# Patient Record
Sex: Female | Born: 1945 | Race: Black or African American | Hispanic: No | Smoking: Never smoker
Health system: Southern US, Community
[De-identification: ages and names within clinical notes are randomized; demographics above are authoritative.]

## PROBLEM LIST (undated history)

## (undated) DIAGNOSIS — H269 Unspecified cataract: Secondary | ICD-10-CM

## (undated) DIAGNOSIS — I10 Essential (primary) hypertension: Secondary | ICD-10-CM

## (undated) DIAGNOSIS — Z8719 Personal history of other diseases of the digestive system: Secondary | ICD-10-CM

## (undated) DIAGNOSIS — M1990 Unspecified osteoarthritis, unspecified site: Secondary | ICD-10-CM

## (undated) DIAGNOSIS — E785 Hyperlipidemia, unspecified: Secondary | ICD-10-CM

## (undated) DIAGNOSIS — G5600 Carpal tunnel syndrome, unspecified upper limb: Secondary | ICD-10-CM

## (undated) DIAGNOSIS — K219 Gastro-esophageal reflux disease without esophagitis: Secondary | ICD-10-CM

## (undated) DIAGNOSIS — Z5189 Encounter for other specified aftercare: Secondary | ICD-10-CM

## (undated) DIAGNOSIS — N8110 Cystocele, unspecified: Secondary | ICD-10-CM

## (undated) DIAGNOSIS — Z87410 Personal history of cervical dysplasia: Secondary | ICD-10-CM

## (undated) HISTORY — DX: Gastro-esophageal reflux disease without esophagitis: K21.9

## (undated) HISTORY — DX: Hyperlipidemia, unspecified: E78.5

## (undated) HISTORY — DX: Unspecified cataract: H26.9

## (undated) HISTORY — PX: TUBAL LIGATION: SHX77

## (undated) HISTORY — DX: Encounter for other specified aftercare: Z51.89

## (undated) HISTORY — PX: BREAST BIOPSY: SHX20

## (undated) HISTORY — DX: Carpal tunnel syndrome, unspecified upper limb: G56.00

## (undated) HISTORY — PX: OTHER SURGICAL HISTORY: SHX169

## (undated) HISTORY — PX: NASAL SEPTUM SURGERY: SHX37

## (undated) HISTORY — DX: Essential (primary) hypertension: I10

---

## 1989-06-29 HISTORY — PX: ABDOMINAL HYSTERECTOMY: SHX81

## 1997-11-08 ENCOUNTER — Other Ambulatory Visit: Admission: RE | Admit: 1997-11-08 | Discharge: 1997-11-08 | Payer: Self-pay | Admitting: Obstetrics and Gynecology

## 1998-11-11 ENCOUNTER — Other Ambulatory Visit: Admission: RE | Admit: 1998-11-11 | Discharge: 1998-11-11 | Payer: Self-pay | Admitting: Obstetrics and Gynecology

## 1999-07-02 ENCOUNTER — Encounter: Admission: RE | Admit: 1999-07-02 | Discharge: 1999-07-02 | Payer: Self-pay | Admitting: Obstetrics and Gynecology

## 1999-07-02 ENCOUNTER — Encounter: Payer: Self-pay | Admitting: Obstetrics and Gynecology

## 1999-11-18 ENCOUNTER — Other Ambulatory Visit: Admission: RE | Admit: 1999-11-18 | Discharge: 1999-11-18 | Payer: Self-pay | Admitting: Obstetrics and Gynecology

## 2000-07-02 ENCOUNTER — Encounter: Payer: Self-pay | Admitting: Obstetrics and Gynecology

## 2000-07-02 ENCOUNTER — Encounter: Admission: RE | Admit: 2000-07-02 | Discharge: 2000-07-02 | Payer: Self-pay | Admitting: Obstetrics and Gynecology

## 2000-11-18 ENCOUNTER — Other Ambulatory Visit: Admission: RE | Admit: 2000-11-18 | Discharge: 2000-11-18 | Payer: Self-pay | Admitting: Obstetrics and Gynecology

## 2001-07-07 ENCOUNTER — Encounter: Admission: RE | Admit: 2001-07-07 | Discharge: 2001-07-07 | Payer: Self-pay | Admitting: Obstetrics and Gynecology

## 2001-07-07 ENCOUNTER — Encounter: Payer: Self-pay | Admitting: Obstetrics and Gynecology

## 2002-07-10 ENCOUNTER — Encounter: Admission: RE | Admit: 2002-07-10 | Discharge: 2002-07-10 | Payer: Self-pay | Admitting: Obstetrics and Gynecology

## 2002-07-10 ENCOUNTER — Encounter: Payer: Self-pay | Admitting: Obstetrics and Gynecology

## 2003-07-13 ENCOUNTER — Encounter: Admission: RE | Admit: 2003-07-13 | Discharge: 2003-07-13 | Payer: Self-pay | Admitting: Obstetrics and Gynecology

## 2003-10-31 ENCOUNTER — Ambulatory Visit (HOSPITAL_COMMUNITY): Admission: RE | Admit: 2003-10-31 | Discharge: 2003-10-31 | Payer: Self-pay | Admitting: Orthopedic Surgery

## 2004-04-08 ENCOUNTER — Ambulatory Visit (HOSPITAL_COMMUNITY): Admission: RE | Admit: 2004-04-08 | Discharge: 2004-04-08 | Payer: Self-pay | Admitting: Internal Medicine

## 2004-05-17 ENCOUNTER — Ambulatory Visit: Payer: Self-pay | Admitting: Internal Medicine

## 2004-06-29 HISTORY — PX: LAPAROSCOPIC CHOLECYSTECTOMY: SUR755

## 2004-07-23 ENCOUNTER — Encounter: Admission: RE | Admit: 2004-07-23 | Discharge: 2004-07-23 | Payer: Self-pay | Admitting: Obstetrics and Gynecology

## 2004-07-25 ENCOUNTER — Ambulatory Visit: Payer: Self-pay | Admitting: Gastroenterology

## 2004-07-28 ENCOUNTER — Ambulatory Visit: Payer: Self-pay | Admitting: Gastroenterology

## 2004-07-30 ENCOUNTER — Ambulatory Visit: Payer: Self-pay | Admitting: Gastroenterology

## 2004-08-21 ENCOUNTER — Ambulatory Visit: Payer: Self-pay | Admitting: Internal Medicine

## 2004-08-28 ENCOUNTER — Ambulatory Visit: Payer: Self-pay | Admitting: Gastroenterology

## 2005-01-23 ENCOUNTER — Ambulatory Visit: Payer: Self-pay | Admitting: Internal Medicine

## 2005-02-25 ENCOUNTER — Ambulatory Visit: Payer: Self-pay | Admitting: Internal Medicine

## 2005-05-05 ENCOUNTER — Ambulatory Visit: Payer: Self-pay | Admitting: Gastroenterology

## 2005-06-15 ENCOUNTER — Ambulatory Visit: Payer: Self-pay | Admitting: Internal Medicine

## 2005-06-25 ENCOUNTER — Encounter (INDEPENDENT_AMBULATORY_CARE_PROVIDER_SITE_OTHER): Payer: Self-pay | Admitting: Specialist

## 2005-06-25 ENCOUNTER — Ambulatory Visit: Payer: Self-pay | Admitting: Internal Medicine

## 2005-06-26 ENCOUNTER — Ambulatory Visit: Payer: Self-pay | Admitting: Cardiology

## 2005-06-26 ENCOUNTER — Inpatient Hospital Stay (HOSPITAL_COMMUNITY): Admission: RE | Admit: 2005-06-26 | Discharge: 2005-07-23 | Payer: Self-pay | Admitting: Surgery

## 2005-07-10 ENCOUNTER — Encounter (INDEPENDENT_AMBULATORY_CARE_PROVIDER_SITE_OTHER): Payer: Self-pay | Admitting: Specialist

## 2005-07-13 ENCOUNTER — Encounter: Payer: Self-pay | Admitting: Internal Medicine

## 2005-07-20 ENCOUNTER — Ambulatory Visit: Payer: Self-pay | Admitting: Infectious Diseases

## 2005-08-07 ENCOUNTER — Ambulatory Visit: Payer: Self-pay | Admitting: Internal Medicine

## 2005-08-21 ENCOUNTER — Ambulatory Visit: Payer: Self-pay | Admitting: Internal Medicine

## 2005-09-08 ENCOUNTER — Ambulatory Visit (HOSPITAL_COMMUNITY): Admission: RE | Admit: 2005-09-08 | Discharge: 2005-09-08 | Payer: Self-pay | Admitting: Surgery

## 2005-09-22 ENCOUNTER — Ambulatory Visit: Payer: Self-pay | Admitting: Internal Medicine

## 2006-02-23 ENCOUNTER — Ambulatory Visit: Payer: Self-pay | Admitting: Internal Medicine

## 2006-03-04 ENCOUNTER — Ambulatory Visit (HOSPITAL_COMMUNITY): Admission: RE | Admit: 2006-03-04 | Discharge: 2006-03-04 | Payer: Self-pay | Admitting: Obstetrics and Gynecology

## 2006-06-17 ENCOUNTER — Ambulatory Visit: Payer: Self-pay | Admitting: Internal Medicine

## 2006-10-01 ENCOUNTER — Ambulatory Visit: Payer: Self-pay | Admitting: Internal Medicine

## 2007-04-27 ENCOUNTER — Ambulatory Visit (HOSPITAL_COMMUNITY): Admission: RE | Admit: 2007-04-27 | Discharge: 2007-04-27 | Payer: Self-pay | Admitting: Obstetrics and Gynecology

## 2007-04-29 ENCOUNTER — Ambulatory Visit: Payer: Self-pay | Admitting: Internal Medicine

## 2007-04-29 ENCOUNTER — Encounter: Payer: Self-pay | Admitting: Internal Medicine

## 2007-08-04 ENCOUNTER — Ambulatory Visit: Payer: Self-pay | Admitting: Internal Medicine

## 2008-03-27 ENCOUNTER — Telehealth: Payer: Self-pay | Admitting: Internal Medicine

## 2008-04-24 ENCOUNTER — Ambulatory Visit: Payer: Self-pay | Admitting: Internal Medicine

## 2008-04-24 LAB — CONVERTED CEMR LAB
Cholesterol: 271 mg/dL (ref 0–200)
Total CHOL/HDL Ratio: 5.6
VLDL: 20 mg/dL (ref 0–40)

## 2008-04-26 ENCOUNTER — Encounter: Payer: Self-pay | Admitting: Internal Medicine

## 2008-04-27 ENCOUNTER — Ambulatory Visit (HOSPITAL_COMMUNITY): Admission: RE | Admit: 2008-04-27 | Discharge: 2008-04-27 | Payer: Self-pay | Admitting: Obstetrics and Gynecology

## 2009-02-11 ENCOUNTER — Ambulatory Visit (HOSPITAL_COMMUNITY): Admission: RE | Admit: 2009-02-11 | Discharge: 2009-02-11 | Payer: Self-pay | Admitting: Internal Medicine

## 2009-02-11 ENCOUNTER — Ambulatory Visit: Payer: Self-pay | Admitting: Internal Medicine

## 2009-02-11 ENCOUNTER — Telehealth (INDEPENDENT_AMBULATORY_CARE_PROVIDER_SITE_OTHER): Payer: Self-pay | Admitting: *Deleted

## 2009-02-11 DIAGNOSIS — M25562 Pain in left knee: Secondary | ICD-10-CM | POA: Insufficient documentation

## 2009-02-11 DIAGNOSIS — E782 Mixed hyperlipidemia: Secondary | ICD-10-CM | POA: Insufficient documentation

## 2009-02-11 DIAGNOSIS — E785 Hyperlipidemia, unspecified: Secondary | ICD-10-CM

## 2009-02-11 LAB — CONVERTED CEMR LAB
Alkaline Phosphatase: 67 units/L (ref 39–117)
Bilirubin, Direct: 0.2 mg/dL (ref 0.0–0.3)
HDL: 68 mg/dL (ref >39)
Indirect Bilirubin: 0.9 mg/dL (ref 0.0–0.9)
Total Bilirubin: 1.1 mg/dL (ref 0.3–1.2)
Triglycerides: 132 mg/dL (ref <150)
VLDL: 26 mg/dL (ref 0–40)

## 2009-03-15 ENCOUNTER — Ambulatory Visit: Payer: Self-pay | Admitting: Internal Medicine

## 2009-03-17 DIAGNOSIS — K650 Generalized (acute) peritonitis: Secondary | ICD-10-CM | POA: Insufficient documentation

## 2009-03-17 DIAGNOSIS — G5600 Carpal tunnel syndrome, unspecified upper limb: Secondary | ICD-10-CM | POA: Insufficient documentation

## 2009-03-17 DIAGNOSIS — K219 Gastro-esophageal reflux disease without esophagitis: Secondary | ICD-10-CM | POA: Insufficient documentation

## 2009-04-30 ENCOUNTER — Ambulatory Visit (HOSPITAL_COMMUNITY): Admission: RE | Admit: 2009-04-30 | Discharge: 2009-04-30 | Payer: Self-pay | Admitting: Internal Medicine

## 2009-04-30 LAB — HM MAMMOGRAPHY: HM Mammogram: NEGATIVE

## 2009-05-27 ENCOUNTER — Ambulatory Visit: Payer: Self-pay | Admitting: Internal Medicine

## 2009-05-27 DIAGNOSIS — J069 Acute upper respiratory infection, unspecified: Secondary | ICD-10-CM | POA: Insufficient documentation

## 2009-08-05 ENCOUNTER — Telehealth (INDEPENDENT_AMBULATORY_CARE_PROVIDER_SITE_OTHER): Payer: Self-pay | Admitting: *Deleted

## 2009-08-05 DIAGNOSIS — S025XXA Fracture of tooth (traumatic), initial encounter for closed fracture: Secondary | ICD-10-CM | POA: Insufficient documentation

## 2009-09-16 ENCOUNTER — Telehealth: Payer: Self-pay | Admitting: Internal Medicine

## 2009-09-16 ENCOUNTER — Ambulatory Visit: Payer: Self-pay | Admitting: Internal Medicine

## 2009-09-17 ENCOUNTER — Ambulatory Visit: Payer: Self-pay | Admitting: Cardiovascular Disease

## 2009-09-17 ENCOUNTER — Ambulatory Visit: Payer: Self-pay | Admitting: Internal Medicine

## 2009-09-17 LAB — CONVERTED CEMR LAB
CO2: 33 meq/L — ABNORMAL HIGH (ref 19–32)
Chloride: 102 meq/L (ref 96–112)
Creatinine, Ser: 0.7 mg/dL (ref 0.4–1.2)
Eosinophils Absolute: 0.1 10*3/uL (ref 0.0–0.7)
Glucose, Bld: 98 mg/dL (ref 70–99)
Lymphocytes Relative: 37.2 % (ref 12.0–46.0)
Lymphs Abs: 1.8 10*3/uL (ref 0.7–4.0)
MCHC: 33.2 g/dL (ref 30.0–36.0)
MCV: 93.1 fL (ref 78.0–100.0)
Monocytes Relative: 9.7 % (ref 3.0–12.0)
Neutrophils Relative %: 50.7 % (ref 43.0–77.0)
Platelets: 247 10*3/uL (ref 150.0–400.0)
RBC: 4.25 M/uL (ref 3.87–5.11)
RDW: 13 % (ref 11.5–14.6)
WBC: 4.8 10*3/uL (ref 4.5–10.5)

## 2010-01-03 ENCOUNTER — Telehealth: Payer: Self-pay | Admitting: Internal Medicine

## 2010-01-07 ENCOUNTER — Ambulatory Visit: Payer: Self-pay | Admitting: Internal Medicine

## 2010-05-01 ENCOUNTER — Ambulatory Visit (HOSPITAL_COMMUNITY): Admission: RE | Admit: 2010-05-01 | Discharge: 2010-05-01 | Payer: Self-pay | Admitting: Obstetrics and Gynecology

## 2010-05-16 ENCOUNTER — Ambulatory Visit: Payer: Self-pay | Admitting: Internal Medicine

## 2010-07-29 NOTE — Progress Notes (Signed)
Summary: CALL A NURSE REPORT  Phone Note Other Incoming   Summary of Call: Call-A-Nurse Triage Call Report [name] Operator: [phone] Triage Record Num: Call Date & Time: [name] Patient Name: 09/14/2009 12:42:01PM [name] PCP: [phone] Patient Phone: PCP Fax : [phone] Patient Gender: Female Aug 20, 1945 Patient DOB: Practice Name: [facility] Reason for Call: C/o upper abd pain onset last PM. Hx reflux. Pain has decreased today. Ate a green pear last PM. Nl BM x 2 today per pt that has decreased the pain. Homecare advised per abd pain protocol. Abdominal Pain / Discomfort Protocol(s) Used: Provide Home/Self Care Recommended Outcome per Protocol: Reason for Outcome: Occasional sensation of burning, fullness, belching, nausea, or acid taste (not related to exertion) Care Advice: Call EMS 911 if having any of the following symptoms: chest, neck, jaw, arm, shoulder, or upper back pain; or shortness of breath at rest.  ~ Heartburn Relief (Dietary): - Avoid overeating. - Eat smaller, more frequent meals and chew each bite thoroughly. - Avoid high fat, spicy or gas-producing foods. - Limit liquids/foods that are gastric irritants (fruit juices, caffeinated, carbonated or alcoholic beverages, chocolate and dairy products). - Eat at least three hours before bedtime.  ~ Page 1 of 1 09/14/2009 12:52:22PM [email or web address] Initial call taken by: Lamar Sprinkles, CMA,  September 16, 2009 11:32 AM  Follow-up for Phone Call        ok Follow-up by: Jacques Navy MD,  September 16, 2009 1:06 PM

## 2010-07-29 NOTE — Progress Notes (Signed)
Summary: DENTAL REFERRAL  Phone Note Call from Patient Call back at Home Phone [phone]   Summary of Call: Patient is requesting a referral to a dental clinic. She has a broken tooth and has [employer].  Initial call taken by: Lamar Sprinkles, CMA,  August 05, 2009 3:32 PM  Follow-up for Phone Call        OK  but to whom is she referred.  Follow-up by: Jacques Navy MD,  August 05, 2009 5:31 PM  Additional Follow-up for Phone Call Additional follow up Details #1::        Spoke with patient, she was told by coordinator that waiting list is [phone] people, so she will not be seen for a long time unless it is an emergency. Please put in referral to [facility] ([phone]). No c/o pain or sensitivity with the tooth but she is afraid that it may break more. She is being very cautious with what she chooses to bite.  Additional Follow-up by: Lamar Sprinkles, CMA,  August 05, 2009 6:10 PM  New Problems: BROKEN TOOTH [phone])   Additional Follow-up for Phone Call Additional follow up Details #2::    OK. forwading to [facility] Follow-up by: Jacques Navy MD,  August 06, 2009 5:01 AM  Additional Follow-up for Phone Call Additional follow up Details #3:: Details for Additional Follow-up Action Taken: Spoke with Pt on Feb 8th informed her that [facility] only takes referrals from [facility] and [facility] [employer] account executive.Pt said she would try and locate a dentist. Additional Follow-up by: Dagoberto Reef,  August 08, 2009 12:57 PM  New Problems: BROKEN TOOTH [phone])

## 2010-07-29 NOTE — Assessment & Plan Note (Signed)
Summary: GAS PAIN X2 DAYS/NO DIARR/NO VOMIT/CD   Vital Signs:  Patient profile:   65 year old female Height:      60.5 inches (153.67 cm) Weight:      143.25 pounds (65.11 kg) O2 Sat:      97 % on Room air Temp:     98.4 degrees F (36.89 degrees C) oral Pulse rate:   75 / minute BP sitting:   114 / 80  (left arm) Cuff size:   regular  Vitals Entered By: Bill Salinas CMA (September 16, 2009 4:46 PM) Taken by Sydell Axon SMA  O2 Flow:  Room air CC: Pt c/o pain in the epigastric region, gas, belching, discomfort X 2days, slight nausea. Pt declined Fluvax, TD booster, Per pt, last mammogram 04/2009, last pap 08/2006, pt requests another. //[place]   Primary Care Provider:  [facility]  CC:  Pt c/o pain in the epigastric region, gas, belching, discomfort X 2days, slight nausea. Pt declined Fluvax, TD booster, Per pt, last mammogram 04/2009, last pap 08/2006, and pt requests another. //[place].  History of Present Illness: Patient is having troule with her stomach with lots of eructation, intermittent pain in the epigastrum - like a spasm on Friday and has continued symptoms.  She has taken Gax-X and benefiber. She is still a little tender. She continues to take prilosec. She has not tried any antacid. She did get relief from baking soda. No change in the bowel habit or in the stools no melena or hematochezia.   After exam she does report migration of her pain from epigastrum to RLQ. She does admit to loss of appetite  Current Medications (verified): 1)  Prilosec Otc 20 Mg  Tbec (Omeprazole Magnesium) .... Once Daily 2)  Multivitamins   Tabs (Multiple Vitamin) .... Once Daily 3)  Simvastatin 20 Mg Tabs (Simvastatin) .Marland Kitchen.. 1 By Mouth Q Pm Fpor Cholesterol 4)  Vit D 400 Iu .... As Needed 5)  Fish Oil .... As Needed  Allergies (verified): 1)  ! Hydrocodone  Past History:  Past Medical History: Last updated: 2009-03-17 Hx of PERITONITIS (ACUTE) GENERALIZED (ICD-567.21) GERD (ICD-530.81) CARPAL  TUNNEL SYNDROME (ICD-354.0) KNEE PAIN, RIGHT (ICD-719.46) OTHER AND UNSPECIFIED HYPERLIPIDEMIA (ICD-272.4)  Past Surgical History: Last updated: 03-17-2009 1) deviated septum in 70's 2) tubal ligation 1991 3) total hysterectomy 1991 4) laproscopic cholecystectomy with complications '06  Family History: Last updated: 03/17/09 Father - deceased  @90 : CVA Mother - deceased @ 46: CAD/M, CVA, HTN IFamily History Diabetes 1st degree relative Family History of CAD Female 1st degree relative <50 Family History Hypertension Family History of Stroke F 1st degree relative <60 Family History Breast cancer 1st degree relative <50  Social History: Last updated: 2009/03/17 HSG Married '68 2 son - '69, '70; 1 son - '71: 2 grandchildren 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  Drug use-no Regular exercise-no  Review of Systems       The patient complains of abdominal pain and severe indigestion/heartburn.  The patient denies anorexia, fever, weight loss, weight gain, hoarseness, dyspnea on exertion, peripheral edema, prolonged cough, muscle weakness, transient blindness, depression, abnormal bleeding, and angioedema.    Physical Exam  General:  Well-developed,well-nourished,in no acute distress; alert,appropriate and cooperative throughout examination Head:  normocephalic.   Lungs:  normal respiratory effort and normal breath sounds.   Heart:  normal rate and regular rhythm.   Abdomen:  tneder to percussion and palpation at McBurney's point.  BS positive.   Impression & Recommendations:  Problem # 1:  ABDOMINAL PAIN, RIGHT LOWER QUADRANT (ICD-789.03) Patient with RLQ pain to percussion and palpation with a history of migrating pain. Concern for appendicitis. She is afebrile and hemodynamically stable and does not require emergent treatment.  Plan - CT abdomen 09/17/09           return for CBC, metabolic panel 09/17/09  Orders: Radiology Referral (Radiology)  Complete  Medication List: 1)  Prilosec Otc 20 Mg Tbec (Omeprazole magnesium) .... Once daily 2)  Multivitamins Tabs (Multiple vitamin) .... Once daily 3)  Simvastatin 20 Mg Tabs (Simvastatin) .Marland Kitchen.. 1 by mouth q pm fpor cholesterol 4)  Vit D 400 Iu  .... As needed 5)  Fish Oil  .... As needed  Patient Instructions: 1)  Right Lower quadrant abdominal pain - need to rule out appendicitis. Plan - may take Mylanta II every four hours for pain in the upper abdomen. Will arrange for a CT scan of the abdomen for Tuesday, 09/17/09, at [facility] on [address]. We will call you with a time. Please drink 1st bottle of contrast material 2 hours before the study, 2nd bottle 1 hour before the study. Come by in the AM for lab - a CBC.   Preventive Care Screening  Mammogram:    Date:  04/29/2009    Results:  normal

## 2010-07-29 NOTE — Progress Notes (Signed)
Summary: Neck pain  Phone Note Call from Patient Call back at Home Phone [phone]   Summary of Call: Pt c/o soreness on left side of her neck x 1 wk. Soreness is there when she turns her head. No fever, swelling, heat or injuries. She is taking tylenol w/relief. Symptoms are better than when first started. Advised office visit tomorrow or contact office if symptoms persisted, became worse or changed. She agreed.  Initial call taken by: Lamar Sprinkles, CMA,  January 03, 2010 10:36 AM  Follow-up for Phone Call        Noted. Agree. Thx Follow-up by: Tresa Garter MD,  January 03, 2010 5:54 PM

## 2010-07-29 NOTE — Assessment & Plan Note (Signed)
Summary: neck pain/tb skin test/cd   Vital Signs:  Patient profile:   65 year old female Height:      60.5 inches Weight:      143 pounds BMI:     27.57 O2 Sat:      97 % on Room air Temp:     97.4 degrees F oral Pulse rate:   64 / minute BP sitting:   124 / 70  (left arm) Cuff size:   regular  Vitals Entered By: Bill Salinas CMA (January 07, 2010 1:44 PM)  O2 Flow:  Room air  Primary Care Provider:  Cayleen Benjamin   History of Present Illness: Patient presents for a 10 day history of fullness and discomfort and the angle of the jaw left. No fever, no chills, no otalgia. The area under the jaw is painful at times but is now better. No night sweats, no weight loss. No pain with mastication. She does have discomfort with deep inspiration and will have some discomfort in the posterior cervical area.   Current Medications (verified): 1)  Prilosec Otc 20 Mg  Tbec (Omeprazole Magnesium) .... Once Daily 2)  Multivitamins   Tabs (Multiple Vitamin) .... Once Daily 3)  Simvastatin 20 Mg Tabs (Simvastatin) .Marland Kitchen.. 1 By Mouth Q Pm Fpor Cholesterol 4)  Vit D 400 Iu .... As Needed 5)  Fish Oil .... As Needed 6)  Mylanta 200-200-20 Mg/29ml Susp (Alum & Mag Hydroxide-Simeth) .... As Needed  Allergies (verified): 1)  ! Hydrocodone  Past History:  Past Medical History: Last updated: 03/15/2009 Hx of PERITONITIS (ACUTE) GENERALIZED (ICD-567.21) GERD (ICD-530.81) CARPAL TUNNEL SYNDROME (ICD-354.0) KNEE PAIN, RIGHT (ICD-719.46) OTHER AND UNSPECIFIED HYPERLIPIDEMIA (ICD-272.4)  Past Surgical History: Last updated: 03/15/2009 1) deviated septum in 70's 2) tubal ligation 1991 3) total hysterectomy 1991 4) laproscopic cholecystectomy with complications '06 PSH reviewed for relevance, FH reviewed for relevance  Review of Systems  The patient denies anorexia, fever, weight loss, decreased hearing, syncope, dyspnea on exertion, prolonged cough, headaches, muscle weakness, suspicious skin lesions, and  enlarged lymph nodes.    Physical Exam  General:  WNWD AA female no distress Head:  Normocephalic and atraumatic without obvious abnormalities. No apparent alopecia or balding. Mouth:  no oral lesions. Posterior pharynx clear Neck:  easily palpable left submandibular gland iwth minimal tenderness. Lungs:  normal respiratory effort and normal breath sounds.   Heart:  normal rate and regular rhythm.   Cervical Nodes:  anterior left cervical chain with enlarge, mobile, minimally tender lymph nodes.    Impression & Recommendations:  Problem # 1:  SIALADENITIS, LEFT (ICD-527.2) By history patient with mild infection of submandibular salivary gland with subsequent lymphadenopathy. She is getting better. No systemic symptoms to suggest malignancy.  Plan - observation: if she continues to improve no further work-up           for lack of improvement or recurrent symptoms will proceed directly to imaging of the neck.   Complete Medication List: 1)  Prilosec Otc 20 Mg Tbec (Omeprazole magnesium) .... Once daily 2)  Multivitamins Tabs (Multiple vitamin) .... Once daily 3)  Simvastatin 20 Mg Tabs (Simvastatin) .Marland Kitchen.. 1 by mouth q pm fpor cholesterol 4)  Vit D 400 Iu  .... As needed 5)  Fish Oil  .... As needed 6)  Mylanta 200-200-20 Mg/4ml Susp (Alum & mag hydroxide-simeth) .... As needed  Other Orders: TB Skin Test [phone]) Admin 1st Vaccine (09811)   Immunizations Administered:  PPD Skin Test:    Vaccine  Type: PPD    Site: left forearm    Mfr: Sanofi Pasteur    Dose: 0.1 ml    Route: ID    Given by: Ami Bullins CMA    Exp. Date: 11/24/2011    Lot #: [id number]  Appended Document: neck pain/tb skin test/cd     Clinical Lists Changes  Observations: Added new observation of TB PPDRESULT: negative (01/09/2010 11:44) Added new observation of PPD RESULT: < 5mm (01/09/2010 11:44) Added new observation of TB-PPD RDDTE: 01/09/2010 (01/09/2010 11:44)      Current Allergies: !  HYDROCODONE   PPD Results    Date of reading: 01/09/2010    Results: < 5mm    Interpretation: negative

## 2010-07-29 NOTE — Assessment & Plan Note (Signed)
Summary: cold,pain thigh,hip,shoulder/cd   Vital Signs:  Patient profile:   65 year old female Height:      60.5 inches Weight:      147 pounds BMI:     28.34 O2 Sat:      98 % on Room air Temp:     98.1 degrees F oral Pulse rate:   75 / minute BP sitting:   122 / 84  (left arm) Cuff size:   regular  Vitals Entered By: Bill Salinas CMA (May 16, 2010 4:54 PM)  O2 Flow:  Room air CC: pt c/o right thigh pain x 3 days/ ab   Primary Care Provider:  Lloyd Cullinan  CC:  pt c/o right thigh pain x 3 days/ ab.  History of Present Illness: Patinet reports three day history of pain in the lateral and medial aspect of the right thigh. Mild paresthesia, more laterally. Has been able to walk. Has some pain with stairs. No injuries, no strain. She took tylenol with little relief. She reports that she had mouth swelling with advil.   She does c/o mild cold symptoms but has had no fever or purulent sputum or rhinorrhea  Current Medications (verified): 1)  Prilosec Otc 20 Mg  Tbec (Omeprazole Magnesium) .... Once Daily 2)  Multivitamins   Tabs (Multiple Vitamin) .... Once Daily 3)  Simvastatin 20 Mg Tabs (Simvastatin) .Marland Kitchen.. 1 By Mouth Q Pm Fpor Cholesterol 4)  Vit D 400 Iu .... As Needed 5)  Fish Oil .... As Needed 6)  Mylanta 200-200-20 Mg/51ml Susp (Alum & Mag Hydroxide-Simeth) .... As Needed  Allergies (verified): 1)  ! Hydrocodone  Past History:  Past Medical History: Last updated: 2009/03/22 Hx of PERITONITIS (ACUTE) GENERALIZED (ICD-567.21) GERD (ICD-530.81) CARPAL TUNNEL SYNDROME (ICD-354.0) KNEE PAIN, RIGHT (ICD-719.46) OTHER AND UNSPECIFIED HYPERLIPIDEMIA (ICD-272.4)  Past Surgical History: Last updated: Mar 22, 2009 1) deviated septum in 70's 2) tubal ligation 1991 3) total hysterectomy 1991 4) laproscopic cholecystectomy with complications '06  Family History: Last updated: 03/22/09 Father - deceased  @90 : CVA Mother - deceased @ 41: CAD/M, CVA, HTN IFamily History  Diabetes 1st degree relative Family History of CAD Female 1st degree relative <50 Family History Hypertension Family History of Stroke F 1st degree relative <60 Family History Breast cancer 1st degree relative <50  Social History: Last updated: 03-22-09 HSG Married '68 2 son - '69, '70; 1 son - '71: 2 grandchildren 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  Drug use-no Regular exercise-no  Review of Systems       The patient complains of hoarseness.  The patient denies anorexia, fever, weight loss, weight gain, chest pain, syncope, dyspnea on exertion, peripheral edema, headaches, abdominal pain, and hematochezia.    Physical Exam  General:  Well-developed,well-nourished,in no acute distress; alert,appropriate and cooperative throughout examination Lungs:  normal respiratory effort.   Heart:  normal rate and regular rhythm.   Msk:  nl ROM legs. Very tender to the lateral aspect of the right thigh along the  bursa and tender at the vastus medialis. Neurologic:  alert & oriented X3, cranial nerves II-XII intact, strength normal in all extremities, sensation intact to pinprick, gait normal, and DTRs symmetrical and normal.   Skin:  turgor normal, color normal, and no rashes.   Psych:  Oriented X3, memory intact for recent and remote, normally interactive, and good eye contact.     Impression & Recommendations:  Problem # 1:  LEG PAIN, RIGHT (ICD-729.5) Her discomfort and tenderness on exam is suggestive of trachanteric  bursitis and gluteal bursitis vs periformis irritation. she may have a collateral tendonopathy for the vastus medialis. This appears to be a short-term problem and not severe DJD hip. Of note she has had angioedema with Advil!  Plan - steroid burst and taper for her pain and inflammation. she may also take APAP.           if no relief will get bilateal hip films. will consider sports medicine referral.   Complete Medication List: 1)  Prilosec Otc 20 Mg Tbec  (Omeprazole magnesium) .... Once daily 2)  Multivitamins Tabs (Multiple vitamin) .... Once daily 3)  Simvastatin 20 Mg Tabs (Simvastatin) .Marland Kitchen.. 1 by mouth q pm fpor cholesterol 4)  Vit D 400 Iu  .... As needed 5)  Fish Oil  .... As needed 6)  Mylanta 200-200-20 Mg/49ml Susp (Alum & mag hydroxide-simeth) .... As needed 7)  Tramadol Hcl 50 Mg Tabs (Tramadol hcl) .Marland Kitchen.. 1 by mouth q 8 for pain 8)  Prednisone 10 Mg Tabs (Prednisone) .... 2 tabs once daily x 3, then 1 tab once daily x 6  Patient Instructions: 1)  Leg pain - seems to be trochanteric bursitis lateral aspect of the leg, tendonitis of the muscle/tendon at the inner thigh. Plan - prednisone 10mg : 2 tabs ad x 3 then 1 tab once daily x 6; for general pain tramadol 50mg  every 8 hours as needed.  Prescriptions: PREDNISONE 10 MG TABS (PREDNISONE) 2 tabs once daily x 3, then 1 tab once daily x 6  #12 x 0   Entered and Authorized by:   Jacques Navy MD   Signed by:   Jacques Navy MD on 05/16/2010   Method used:   Electronically to        [facility] (retail)       [address]       [place], [place]  [id number]       Ph: [phone]       Fax: [phone]   RxID:   [id number] TRAMADOL HCL 50 MG TABS (TRAMADOL HCL) 1 by mouth q 8 for pain  #15 x 1   Entered and Authorized by:   Jacques Navy MD   Signed by:   Jacques Navy MD on 05/16/2010   Method used:   Electronically to        [facility] [address] (retail)       [address]       [place], [place]  [id number]       Ph: [phone]       Fax: [phone]   RxID:   [id number]    Orders Added: 1)  Est. Patient Level III [25427]

## 2010-11-14 ENCOUNTER — Encounter: Payer: Self-pay | Admitting: Internal Medicine

## 2010-11-14 NOTE — Letter (Signed)
May 04, 2006      To whom it may concern:   RE:  VITALIA, STOUGH  MRN:  [id number]  /  DOB:  [birth year]   Ms. Jaelah Hauth has been my patient for some time.  She has recovered  nicely from gallbladder surgery with complications.   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  I  would recommend against her lifting anything heavier than 20 pounds, and she  should not be in a situation where she needs to exert a lot of strength  pulling or pushing patients.  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   If I can provide any additional information, please do not hesitate to  contact me.   Thank you for your consideration in this matter.  I remains yours truly.    Sincerely,     ______________________________  Rosalyn Gess. Norins, MD    MEN/MedQ  DD: 05/04/2006  DT: 05/04/2006  Job #: [id number]

## 2010-11-14 NOTE — Discharge Summary (Signed)
[name], [name]                ACCOUNT NO.:  [id number]   MEDICAL RECORD NO.:  [id number]          PATIENT TYPE:  INP   LOCATION:  1431                         FACILITY:  [facility]   PHYSICIAN:  Currie Paris, M.D.DATE OF BIRTH:  [birth year]   DATE OF ADMISSION:  06/25/2005  DATE OF DISCHARGE:  07/23/2005                                 DISCHARGE SUMMARY   FINAL DIAGNOSES:  1.  Chronic calculus cholecystitis.  2.  Postoperative bile leak with bile peritonitis.  3.  Postoperative intra-abdominal abscess.  4.  Tachycardia, uncertain etiology.  5.  Accidental injury to small intestine.  6.  Bilateral pleural effusions.  7.  Anemia.  8.  Malnutrition.   CLINICAL HISTORY:  [name] is a generally healthy 65 year old lady who  was admitted on December 28 for elective laparoscopic cholecystectomy.  The  procedure went well with no apparent complications.  Postoperatively, she  had a fair amount of nausea and incisional pain.  The next morning. she was  continuing to have nausea and some vomiting.  Her incisions appeared bland.  She had some diminished bowel sounds.  She was treated with some Reglan and  kept instead of discharged.  On December 30, she was noted to have marked  tachycardia but she thought her heart rate was due to her IV fluids.  She  was given a little bit of Xanax.  The patient was then moved to telemetry.  On exam later that day, she had some mild right upper quadrant tenderness.  Because of her tachycardia, bile leak was a concern, and a hepatobiliary  scan done.  Her liver functions were also noted to be elevated on December  30, and her HIDA scan looked like an accessory bile duct leak.  The next  morning, she apparently felt slightly better but had abdominal tenderness.  Dr. Leone Payor attempted an ERCP under general anesthesia but was unable to  successfully do this.  This is immediately followed by CT scan, which showed  a fair amount of fluid in the  abdomen.  It was decided at that point to  return her to the operating room for exploratory laparotomy.  She was  therefore re-explored late on December 31.  The finding was that of a bile  leak with a diffuse bile peritonitis.  She had a fair amount of adhesions  from prior surgery, and a small enterotomy occurred during the exploration,  which was immediately repaired.  She tolerated the procedure well.  We did  leave a JP around the liver/gallbladder bed.  It was not clear whether she  was leaking from her cystic duct stump or from the bed of the gallbladder.  The cystic duct was reclipped and the drain placed.  Postoperatively, she  appeared improved.  She was begun immediately on TNA.  Over the next couple  of days, she appeared to be gradually improving.  She continued to have a  pulse slightly elevated.  Abdomen was distended.  She had minimal drainage  from her JP and never leaked any further bile.  Cultures from the time of  surgery showed negative rods, and she was kept on antibiotics for this.  We  DC'd her NG on January 4.  She was having some wound drainage but did not  appear to be purulent.  Went ahead and got a CT scan.  It appeared she had  several fluid collections, one of which was on the left subphrenic behind  the stomach and a couple of smaller ones, one in the right lower quadrant  and one in the pelvis.  Interventional radiology attempted to aspirate the  left upper quadrant subphrenic collection, and again, this did have bacteria  in it.  She was maintained on antibiotics, but white count had gone up  significantly.   On January 8, we were able to get her Foley out, tried to increase her diet,  and repeated her CT scan, which continued to show a significant fluid  collection in the left upper quadrant.  The other two collections were  getting smaller without any treatment other than antibiotics.  The left  subphrenic collection still measured about 7 cm, and she  was returned to  interventional radiology, where this was able to be successfully drained  with a 10 [place] drain.  Following that, the patient had significant pleural  pain, and it was thought that the drain was probably causing some  diaphragmatic irritation.  In addition, follow-up scans have shown  increasing pleural effusions, both of which were eventually tapped.  Because  she had minimal drainage out of her drain, we were able to pull the drain  out.  Because of her persistent tachycardia, we had cardiology see her, and  they placed her on some Lopressor, which helped.  There was no specific  other etiology for her tachycardia noted.  The next several days were that  of very gradual improvement.  We did get her drain pulled out, followed CT  scans, and her little subphrenic collection continued to get smaller, and  the other two basically resolved.  We maintained her on antibiotics  throughout her entire course.  Gradually, her white count returned to  normal.  Because of chronic anemia, she was begun on some iron.  Thyroid  functions were checked and appeared normal.  C. diff's were checked and were  negative.  She did have some mild nausea and took a while before she could  tolerate the diet but eventually was able to start eating and was able to be  weaned from her TNA.  I had home health care come to see her.  ID suggested  that she continue antibiotics for at least another week.  The final CT done  just prior to discharge showed a small residual left upper quadrant  collection, small wound midline collection but did not appear to be  infected.  By January 25, the patient was finally ready for discharge.  She  is sent home on Lopressor, Nu-Iron, Cipro, and Flagyl.      Currie Paris, M.D.  Electronically Signed     CJS/MEDQ  D:  08/18/2005  T:  08/18/2005  Job:  [id number]

## 2010-11-14 NOTE — Op Note (Signed)
[name], [name]                ACCOUNT NO.:  [id number]   MEDICAL RECORD NO.:  [id number]          PATIENT TYPE:  AMB   LOCATION:  DAY                          FACILITY:  [facility]   PHYSICIAN:  Currie Paris, M.D.DATE OF BIRTH:  [birth year]   DATE OF PROCEDURE:  06/25/2005  DATE OF DISCHARGE:                                 OPERATIVE REPORT   PREOPERATIVE DIAGNOSIS:  Chronic calculus cholecystitis.   POSTOPERATIVE DIAGNOSES:  1.  Chronic calculus cholecystitis.  2.  Intra-abdominal adhesions.   OPERATION:  Laparoscopic cholecystectomy with operative cholangiogram, lysis  of adhesions   SURGEON:  Dr. Jamey Ripa   ASSISTANT:  Dr. Corliss Skains   ANESTHESIA:  General endotracheal.   CLINICAL HISTORY:  This patient has had a history of biliary-type symptoms  and a finding of gallstone on ultrasound.  She elected to proceed to  cholecystectomy.  She probably previously had a total hysterectomy.   DESCRIPTION OF PROCEDURE:  The patient was seen in the holding area, and she  had no further questions.  We confirmed that cholecystectomy was the planned  procedure.   The patient was taken to the operating room and after satisfactory general  endotracheal anesthesia had been obtained, the abdomen was prepped and  draped.  The time-out occurred.   I used 0.25% plain Marcaine for each incision.  The umbilical incision was  made, the fascia opened, and the peritoneal cavity entered under direct  vision.  There were a few omental adhesions stuck to the anterior abdominal  wall here, and these were bluntly taken down so I could get the trocar in.  The abdomen was insufflated to 15.  With the camera in place, I could see  multiple adhesions into the right mid abdomen.  However, the epigastric area  was free, and I was able to put a 10/11 trocar there under direct vision.   Once this was done, I took down the adhesions in the right mid abdomen so  that we could get the lateral trocars in.   This was done either with  scissors or with some blunt dissection.   Once this was done, I put the 2 lateral trocars in.  The patient was in  reverse Trendelenburg and tilted to the left.  Multiple omental adhesions to  the gallbladder were taken down so that we could get good retraction and  exposure of the gallbladder.   The peritoneum over the cystic duct was opened and a long segment dissected  out.  The artery was noted posteriorly, and 1 clip was placed on this.  Once  I had the cystic duct identified, I put a clip on it and opened it.   A Cook catheter was introduced percutaneously, threaded in, and held with a  clip.  Operative angiography appeared normal with a fairly long cystic duct  remnant filling into the liver and hepatic radicals and into the duodenum  with no obvious filling defects.   The catheter was removed and 3 clips placed on the stay side of the duct.  A  clip was placed on a small branch  of the artery that was associated with the  cystic duct and then 2 more on the main cystic artery prior to dividing it.   The gallbladder was then removed from below to above with coagulation  current of the cautery.  It was placed in a bag.  I irrigated and made sure  everything was dry and then brought the gallbladder out the umbilical port.  That was occluded for few moments while we did another irrigation and  suctioning and made sure everything was dry.  Then the lateral ports were  removed under direct vision.  The pursestring was used to close the  umbilical port.  The abdomen was deflated through the epigastric port.  Skin  was closed with 4-0 Monocryl subcuticular plus Dermabond.   The patient tolerated the procedure well.  There were no operative  complications.  All counts were correct.      Currie Paris, M.D.  Electronically Signed     CJS/MEDQ  D:  06/25/2005  T:  06/25/2005  Job:  [id number]   cc:   Rosalyn Gess. Norins, M.D. [facility]  [address]   [place]  [place] [address]   Vania Rea. Jarold Motto, M.D. [facility]  [address]  [place]  [place] [address]

## 2010-11-14 NOTE — Consult Note (Signed)
[name], [name]                ACCOUNT NO.:  [id number]   MEDICAL RECORD NO.:  [id number]          PATIENT TYPE:  INP   LOCATION:  1406                         FACILITY:  [facility]   PHYSICIAN:  Iva Boop, M.D. LHCDATE OF BIRTH:  [birth year]   DATE OF CONSULTATION:  DATE OF DISCHARGE:                                   CONSULTATION   PRIMARY CARE PHYSICIAN:  Dr. Illene Regulus   GASTROENTEROLOGIST:  Dr. Sheryn Bison   REASON FOR CONSULTATION:  Bile duct leak.   ASSESSMENT:  This is a 65 year old woman, status post laparoscopic  cholecystectomy on June 25, 2005.  She has abdominal pain, the HIDA scan  shows accumulation of tracer consistent with a bile duct leak.  Retrospective review of the cholangiogram suggests an accessory duct leak.  There were no common duct stones on the cholangiogram.   PLAN:  Endoscopic retrograde cholangiopancreatography with stent placement  to treat her bile duct leak.  I have explained the risks, benefits and  indications.  She understands and agrees to proceed.  Risks that include but  are not limited to bleeding, infection, medication risks, perforation,  pancreatitis, possible need for surgery are explained. IV Unasyn and  antibiotics will be given as well.  I think she will need a ultrasound to  look for a biloma as well which can be performed after the endoscopic  retrograde cholangiopancreatography.   HISTORY OF PRESENT ILLNESS:  This is a pleasant 65 year old African-American  woman who developed problems with abdominal pain and had gallstones on  ultrasound.  She underwent a laparoscopic cholecystectomy by Dr. Jamey Ripa and  Corliss Skains on June 25, 2005.  She had some adhesiolysis carried out.  The  gallbladder was removed and pathology showed cholelithiasis, chronic  cholecystitis and cholesterolosis.  She had some pain and nausea afterwards  and subsequently had this HIDA scan performed yesterday which showed the  accumulation of  tracer lateral to the duodenum consistent with leak.  Retrospective review of cholangiogram does show a probable leak from the  accessory duct level there pericystic duct region.  She has had low grade  temperature elevation and tachycardia which has improved with IV fluids.  She complains of relatively diffuse abdominal pain worse in the right upper  quadrant.   PAST MEDICAL HISTORY:  1.  Gastroesophageal reflux disease.  2.  Irritable bowel syndrome.  3.  Anxiety.  4.  Status post hysterectomy.  5.  Status post deviated septum repair.  6.  Dyslipidemia.   CURRENT MEDICATIONS:  1.  Diclofenac 75 mg b.i.d.  2.  Gemfibrozil 600 mg b.i.d. with meals.  3.  Protonix 80 mg daily.  4.  Pseudoephedrine 30 mg b.i.d.  5.  Xanax 0.25 mg every six hours as needed.  6.  Dilaudid p.r.n. pain.  7.  Ondansetron p.r.n. nausea.  8.  Percocet p.r.n. pain.   ALLERGIES:  1.  Vicodin causes tachycardia and nausea.  2.  Ibuprofen or Advil has caused rash and swelling.   SOCIAL HISTORY:  No tobacco or alcohol.   FAMILY HISTORY:  Noncontributory.   REVIEW OF  SYSTEMS:  Reflux and sinus problems as mentioned.  All other  systems appear negative at this time.   PHYSICAL EXAMINATION:  GENERAL: Pleasant, middle-aged black woman in no  acute distress.  Temperature 98.1, pulse 120, respirations 20, blood  pressure 109/74.  The pulse is regular.  HEENT: Eyes anicteric.  Mouth free of lesions.  Neck supple.  CHEST: Clear.  HEART: S1, S2, no gallops.  ABDOMEN: Somewhat distended.  She had some mild to moderate tenderness right  upper quadrant more so than the rest of the abdomen though, has some  perincisional tenderness as well.  EXTREMITIES: No edema.  NEUROLOGIC: She is alert and oriented x3.  Lymph nodes in the neck are  supraclavicular nodes.  SKIN: Without rash.   LABORATORY DATA:  Hemoglobin is 14.2, hematocrit 42, white count 7.1,  bilirubin 2.8, AST 23, ALT 21, albumin 2.2, glucose 117, BUN  25, creatinine  1.3, sodium 130, potassium 3.8, chloride 102, CO2 21, glucose 117.   I personally reviewed the x-ray studies described above.   ADDENDUM:  She is somewhat dehydrated which I think explains the tachycardia  and rise in her creatinine and BUN as well as the drop in sodium which is  improving.   NOTE: Given her tenderness and anxiety/Xanax use, I have asked anesthesia to  provide general anesthesia for this case to make it go more smoothly.  Risks, benefits have been explained to the patient.      Iva Boop, M.D. [facility]  Electronically Signed     CEG/MEDQ  D:  06/28/2005  T:  06/29/2005  Job:  [id number]   cc:   Rosalyn Gess. Norins, M.D. [facility]  [address]  [place]  [place] [address]   Vania Rea. Jarold Motto, M.D. [facility]  [address]  [place]  [place] [address]   Currie Paris, M.D.  [address]  [place]  [place] [address]

## 2010-11-14 NOTE — Consult Note (Signed)
NAMEJOHNNI, WUNSCHEL                ACCOUNT NO.:  [id number]   MEDICAL RECORD NO.:  [id number]          PATIENT TYPE:  INP   LOCATION:  1612                         FACILITY:  [facility]   PHYSICIAN:  Rollene Rotunda, M.D.   DATE OF BIRTH:  05/28/1946   DATE OF CONSULTATION:  07/10/2005  DATE OF DISCHARGE:                                   CONSULTATION   PRIMARY CARE PHYSICIAN:  Rosalyn Gess. Norins, M.D.   CARDIOLOGIST:  New, being seen by Dr. Antoine Poche today.   REASON FOR CONSULTATION:  We were asked by Dr. Tenna Child group to evaluate  [name], who is a 65 year old African-American female with no known  coronary artery disease history, never evaluated by a cardiologist.  The  only cardiac history is palpitations per patient, which she has contributed  to stress and anxiety in the past, and for which she takes Xanax p.r.n.  On  this admission, we were asked to see [name] secondary to sinus  tachycardia.  [name] has an extensive recent GI history with history  beginning in December of 2006 status post laparoscopic cholecystectomy.  Ms.  Hebert apparently suffered some complications resulting in a bile duct leak.  Apparently, ERCP was attempted without success.  Further complications  resulted in an abdominal abscess.  CT scans also positive for bilateral  pleural effusions, most likely secondary to abdominal abscess/distress.  The  patient is status post percutaneous fluid drains with a left subphrenic  aspiration resulting in placement of a JP drain, which continues to drain  small amounts of bloody serous drainage.  Most recently, [name] is  status post left thoracentesis secondary to bilateral pleural effusions,  resulting in 480 cc bloody thoracentesis fluid.  Currently, [name] is in  no acute distress.  She is running a low-grade temperature, slightly  hypertensive, with a heart rate of 119 on a 12-lead EKG, with the EKG  showing sinus tachycardia with very nonspecific T  wave changes, no acute  abnormalities noted.   ALLERGIES:  1.  VICODIN which causes nausea.  2.  IBUPROFEN.  3.  ADVIL.   MEDICATIONS:  1.  Diclofenac 75 mg b.i.d.  2.  Gemfibrozil 600 mg b.i.d.  3.  Protonix 80 mg.  4.  Pseudoephedrine 30 mg b.i.d. at home.  (The patient is not currently on      pseudoephedrine.)  5.  Xanax 0.25 mg q.6h. p.r.n.  6.  Dilaudid p.r.n.  7.  Zofran p.r.n.  8.  Percocet p.r.n.  [name], she is on -  1.  Morphine PCA pump.  2.  Prevacid.  3.  Sliding scale insulin coverage.  4.  Lovenox 40 mg subcutaneous daily.  5.  D5 normal saline fluid.  6.  Cipro.  7.  Flagyl.  8.  Central TNA.  9.  Ensure.  10. Compazine.  11. Reglan.  12. Zofran p.r.n.  (She is not on any medications at this time which would contribute to sinus  tachycardia.)   PAST MEDICAL HISTORY:  1.  Dyslipidemia.  2.  GERD/hiatal hernia.  3.  Irritable bowel.  4.  Anxiety.   SURGERIES:  1.  Status post hysterectomy.  2.  Deviated septum repair.  3.  Laparoscopic cholecystectomy in December of 2006.  4.  Attempted ERCP.   The patient denies any thyroid, diabetes, respiratory illnesses.   SOCIAL HISTORY:  She lives in [place] with her husband and 2 children.  She  has never used any tobacco products.  Denies any ETOH, drugs or other  medications.   FAMILY HISTORY:  Noncontributory for coronary artery disease.   REVIEW OF SYSTEMS:  Includes fevers, chills, sweats.  Some strain of voice  since she has been in the hospital.  CARDIOPULMONARY:  Positive for  orthopnea secondary to GERD.  Palpitations with increased stress.  Positive  for numbness in the left hand intermittently and anxiety. ENDOCRINE:  Positive for hair changes.  The patient states her hair has been breaking.  All other systems negative per patient.   PHYSICAL EXAMINATION:  VITAL SIGNS:  Temperature 100.6, pulse ranges from  113 to 140, respirations 26, blood pressure 155/93.  She is satting  92% on  room air.  GENERAL:  She is in no acute distress.  A very pleasant 65 year old [place]  American female.  HEENT:  Pupils equal, round and reactive to light.  Sclerae clear.  NECK:  Supple without lymphadenopathy.  Negative JVD.  Negative bruits.  Negative for thyromegaly.  CARDIOVASCULAR:  Heart rhythm regular.  Slightly tachycardic.  S1 and S2  without rubs, murmurs or gallops.  LUNGS:  Clear to auscultation with dullness to percussion in the bilateral  bases.  ABDOMEN:  Soft, tender.  She has positive bowel sounds.  Has a dressing dry  and intact to the right upper quadrant.  JP drain to the lateral left  abdomen.  EXTREMITIES:  Without clubbing, cyanosis, or edema.  Negative to calf  tenderness or swelling.  NEUROLOGIC:  She is alert and oriented x3.  Cranial nerves II-XII grossly  intact.   EKG shows sinus tachycardia without acute ST-T wave changes.   LABORATORY DATA:  White blood cell count 18.4, hemoglobin 9.2, hematocrit  28, with a platelet count of 741,000.  Sodium 132, potassium 4, BUN 11,  creatinine 0.5 with glucose of 131.   Dr. Angelina Sheriff in to exam and assess the patient with tachycardia in the  setting of recent surgical procedures, low grade temperature and incisional  pain.  Tachycardia has been persistent, ranging from 100 to 130.  The  patient denied any chest discomfort.  No symptoms suggestive of CHF or  angina.  Good exercise tolerance.   IMPRESSION:  1.  Tachycardia, probably related to fever.  2.  Malnutrition.  3.  Pain.  4.  Deconditioning.  5.  Anxiety.   We will, however, check a TSH level, 2-D echocardiogram.  We would like the  patient to be placed on telemetry for observation.  We will initiate a low-  dose beta blocker for hypertension and follow along with the patient's care  during this hospitalization.      Dorian Pod, NP   ______________________________  Rollene Rotunda, M.D.    MB/MEDQ  D:  07/10/2005  T:   07/11/2005  Job:  [id number]

## 2010-11-14 NOTE — Op Note (Signed)
[name], [name]                ACCOUNT NO.:  [id number]   MEDICAL RECORD NO.:  [id number]          PATIENT TYPE:  INP   LOCATION:  1406                         FACILITY:  [facility]   PHYSICIAN:  Currie Paris, M.D.DATE OF BIRTH:  20-Jan-1946   DATE OF PROCEDURE:  06/28/2005  DATE OF DISCHARGE:                                 OPERATIVE REPORT   PREOPERATIVE DIAGNOSIS:  Bile leak/bile peritonitis.   POSTOPERATIVE DIAGNOSIS:  Bile leak/bile peritonitis. Accidental small bowel  enterotomy.   OPERATION:  Diagnostic laparoscopy, exploratory laparotomy with reclosure of  cystic duct stump, closure of small bowel enterotomy and drainage of  gallbladder fossa.   SURGEON:  Dr. Jamey Ripa.   ASSISTANT:  Dr. Zachery Dakins.   ANESTHESIA:  General endotracheal.   CLINICAL HISTORY:  This is a 65 year old lady who had undergone what had  initially appeared to be an uneventful laparoscopic cholecystectomy. She  tolerated the procedure well but that night had too much nausea to go home.  She was having a little tenderness on October 30 and labs were checked,  liver functions were up and then the next morning, this morning, she  developed tachycardia, more abdominal pain and hepatobiliary scan showed  what appeared to be a leak in the bed of the gallbladder. With the  hepatobiliary scan showing the leak, ERCP was scheduled and attempted but  unsuccessful. Because of continued symptoms, we went ahead and checked a CT  and saw a marked amount of peritoneal fluid and at this point decided an  exploratory laparotomy would be the appropriate next step to at least drain  the bile and see if there was any leak to be closed or at least place the  drain in the gallbladder fossa.   DESCRIPTION OF PROCEDURE:  The patient was taken seen in the holding area  and had no further questions. She was taken to the operating room and after  satisfactory general endotracheal anesthesia had been obtained, the  abdomen  was prepped and draped. The time out occurred.   The old sutures at the umbilicus were opened, the fascia opened and bile  fluid leaked out and some was suctioned out with the intestinal sucker. A  pursestring was placed, the Hasson introduced and the abdomen insufflated.  There was marked bile staining throughout the abdomen with a lot of exudate  over the surface of the liver and the right hemi-diaphragm. I could not  really visualize into the pelvis because of adhesions from prior surgeries  and this was fairly socked in as was the left upper quadrant. I continued  irrigating and suctioning and was uncomfortable that we were really going to  be able to get this adequately debrided and cleaned out with laparoscopy. We  therefore converted to an open procedure.   The abdomen was opened through a midline incision. In doing so, a markedly  edematous loop of small bowel that was stuck just below the umbilicus tore  slightly producing an enterotomy. Once this was completely freed up from the  adhesions, it was closed transversely with the TA-60 stapler using 2  applications. At the end of the case, we checked to make sure this was  patent and without leak.   I then spent a lot of time irrigating. We mobilized the small bowel out of  the pelvis. We freed up the rest to be omental adhesions to the small bowel  so we could get good visualization and make sure there were no small bowel  injuries. The bilious peritoneal fluid and exudate was suctioned out of the  pelvis.   Attention was then turned into the right upper quadrant, the bed of the  gallbladder did not show any obvious leaks. However, the majority of the  exudate was really right in this area indicating that this was the source of  leak. We looked at the area of the duodenum to make sure that no accidental  injuries had occurred to the duodenum at the time of the cholecystectomy and  it appeared intact. There appeared to  be a tiny little bit of bile leaking  right around the cystic duct. Upon seeing this, we freed this up a little  bit more. Because of all the inflammatory changes, I was hesitant to do any  formal dissection of the common duct but I went ahead and grasped the tip of  the cystic duct, removed the prior clips. Once that was done, I put a right  angle across it, double tied it with 2-0 Vicryl and then put a clip across  it as well. There was a small point just adjacent that also had a small clip  placed. We spent several minutes irrigating and never saw any further  evidence of any bile leakage or staining in this area.   I continued by using several liters of saline to irrigate the entire  abdominal cavity rinsing all the small bowel up into the left upper  quadrant, up into the right upper quadrant, back into the pelvis, etc. Once  I was satisfied we had nothing but clear irrigant coming back and all the  bile had been suctioned out, I  placed a 19 Blake drain through one of the  old lateral abdominal trocar sites and placed it into the bed of the  gallbladder. We suctioned out until everything was dry. I then put Tisseel  on the cystic duct remnant and around that area to make sure we did not see  any leaks there and to see if this would try to also seal off any possible  leak. I then also put some on the enterotomy closure before replacing the  small bowel into the peritoneal cavity.   Once this was done, the abdomen was closed with a running double looped #1  PDS. The skin was loosely closed with some staples and some Telfa wicks  placed. The patient was stable throughout. Her pulse slowed from 145 at the  time of induction down to about 120.      Currie Paris, M.D.  Electronically Signed     CJS/MEDQ  D:  06/28/2005  T:  06/29/2005  Job:  [id number]

## 2010-11-19 ENCOUNTER — Ambulatory Visit (INDEPENDENT_AMBULATORY_CARE_PROVIDER_SITE_OTHER): Payer: Medicare HMO | Admitting: Internal Medicine

## 2010-11-19 ENCOUNTER — Other Ambulatory Visit (INDEPENDENT_AMBULATORY_CARE_PROVIDER_SITE_OTHER): Payer: Medicare HMO

## 2010-11-19 VITALS — BP 128/82 | HR 87 | Temp 98.7°F | Wt 149.0 lb

## 2010-11-19 DIAGNOSIS — Z136 Encounter for screening for cardiovascular disorders: Secondary | ICD-10-CM

## 2010-11-19 DIAGNOSIS — K219 Gastro-esophageal reflux disease without esophagitis: Secondary | ICD-10-CM

## 2010-11-19 DIAGNOSIS — E785 Hyperlipidemia, unspecified: Secondary | ICD-10-CM

## 2010-11-19 DIAGNOSIS — Z23 Encounter for immunization: Secondary | ICD-10-CM

## 2010-11-19 DIAGNOSIS — Z0000 Encounter for general adult medical examination without abnormal findings: Secondary | ICD-10-CM

## 2010-11-19 LAB — COMPREHENSIVE METABOLIC PANEL
Albumin: 4.2 g/dL (ref 3.5–5.2)
Alkaline Phosphatase: 71 U/L (ref 39–117)
BUN: 17 mg/dL (ref 6–23)
CO2: 31 mEq/L (ref 19–32)
Calcium: 9.5 mg/dL (ref 8.4–10.5)
GFR: 96.72 mL/min (ref >60.00)
Glucose, Bld: 106 mg/dL — ABNORMAL HIGH (ref 70–99)
Potassium: 4.5 mEq/L (ref 3.5–5.1)

## 2010-11-19 LAB — CBC WITH DIFFERENTIAL/PLATELET
Basophils Absolute: 0 10*3/uL (ref 0.0–0.1)
Eosinophils Absolute: 0 10*3/uL (ref 0.0–0.7)
Lymphocytes Relative: 24.4 % (ref 12.0–46.0)
Lymphs Abs: 1.5 10*3/uL (ref 0.7–4.0)
MCHC: 33.9 g/dL (ref 30.0–36.0)
Monocytes Relative: 8.5 % (ref 3.0–12.0)
Platelets: 296 10*3/uL (ref 150.0–400.0)
RDW: 14.3 % (ref 11.5–14.6)

## 2010-11-19 LAB — HEPATIC FUNCTION PANEL
AST: 21 U/L (ref 0–37)
Albumin: 4.2 g/dL (ref 3.5–5.2)
Total Bilirubin: 1 mg/dL (ref 0.3–1.2)

## 2010-11-19 LAB — LIPID PANEL
Cholesterol: 199 mg/dL (ref 0–200)
VLDL: 25.6 mg/dL (ref 0.0–40.0)

## 2010-11-19 LAB — TSH: TSH: 1.39 u[IU]/mL (ref 0.35–5.50)

## 2010-11-19 MED ORDER — RANITIDINE HCL 150 MG PO TABS: 150.0000 mg | ORAL_TABLET | Freq: Two times a day (BID) | ORAL | Status: DC

## 2010-11-19 MED ORDER — TETANUS-DIPHTH-ACELL PERTUSSIS 5-2.5-18.5 LF-MCG/0.5 IM SUSP: 0.5000 mL | Freq: Once | INTRAMUSCULAR | Status: DC

## 2010-11-19 NOTE — Progress Notes (Signed)
Subjective:    Patient ID: [name], female    DOB: Jul 27, 1945, 65 y.o.   MRN: [id number]  HPI The patient is here for annual Medicare wellness examination and management of other chronic and acute problems. Interval history without serious illness, injury, surgery or hositalization. She does c/o low back pain that has responded to APAP and is improving. She reports that prilosec is not relieving her dyspepsia as well as it used to.    The risk factors are reflected in the social history.  The roster of all physicians providing medical care to patient - is listed in the Snapshot section of the chart.  Activities of daily living:  The patient is 100% inedpendent in all ADLs: dressing, toileting, feeding as well as independent mobility. She is cognitively intact: runs the household, works, handles her accounts independently.  Home safety : The patient has smoke detectors in the home. They wear seatbelts.No firearms at home . There is no violence in the home. She has had no falls and has no fall risks.   There is no risks for hepatitis, STDs or HIV. There is no   history of blood transfusion. They have no travel history to infectious disease endemic areas of the world.  The patient has not seen their dentist in the last six month. They have not seen their eye doctor in the last year. They deny  any hearing difficulty and have not had audiologic testing in the last year.  They do not  have excessive sun exposure. Discussed the need for sun protection: hats, long sleeves and use of sunscreen if there is significant sun exposure.   Diet: the importance of a healthy diet is discussed. They do have a healthy diet.  Exercise: no regular exercise program.  Counseling this visit included advantage of regular exercise, continued healthy diet.   Past Medical History  Diagnosis Date  . Peritonitis (acute) generalized   . GERD (gastroesophageal reflux disease)   . CTS (carpal tunnel syndrome)     . Knee pain, right   . Hyperlipidemia    Past Surgical History  Procedure Date  . Nasal septum surgery 70's  . Tubal ligation 1991  . Abdominal hysterectomy 1991    total  . Cholecystectomy 2006    laproscopic w/ complications   Family History  Problem Relation Age of Onset  . Coronary artery disease Mother   . Other Mother     cva  . Hypertension Mother   . Other Father     CVA  . Diabetes Other   . Coronary artery disease Other   . Hypertension Other   . Stroke Other   . Cancer Other     breast   History   Social History  . Marital Status: Married    Spouse Name: N/A    Number of Children: N/A  . Years of Education: N/A   Occupational History  . Not on file.   Social History Main Topics  . Smoking status: Not on file  . Smokeless tobacco: Not on file  . Alcohol Use:   . Drug Use: No  . Sexually Active:    Other Topics Concern  . Not on file   Social History Narrative   Married '68. 2 son - '69, '70; 1 son - '71: 2 grandchildren. 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         Review of Systems Review of Systems  Constitutional:  Negative for fever, chills, activity change and  unexpected weight change.  HENT:  Negative for hearing loss, ear pain, congestion, neck stiffness and postnasal drip.   Eyes: Negative for pain, discharge and visual disturbance.  Respiratory: Negative for chest tightness and wheezing.   Cardiovascular: Negative for chest pain and palpitations.      [No decreased exercise tolerance Gastrointestinal: No change in bowel habit. No bloating or gas. No reflux or indigestion Genitourinary: Negative for urgency, frequency, flank pain and difficulty urinating.  Musculoskeletal: Negative for myalgias, back pain, arthralgias and gait problem.  Neurological: Negative for dizziness, tremors, weakness and headaches.  Hematological: Negative for adenopathy.  Psychiatric/Behavioral: Negative for behavioral problems and dysphoric  mood.       Objective:   Physical Exam Physical Exam  (per Roosevelt Locks, MS III) Constitutional: She is oriented to person, place, and time. Vital signs are normal. She appears well-developed and well-nourished.    Very pleasant AA woman in no distress  HENT:  Head: Normocephalic and atraumatic.  Right Ear: External ear normal.  Left Ear: External ear normal.       EACs and TMs normal  Eyes: Conjunctivae and EOM are normal. Pupils are equal, round, and reactive to light. No scleral icterus.  Neck: Normal range of motion. Neck supple. No JVD present. No thyromegaly present.  Cardiovascular: Normal rate, regular rhythm and normal heart sounds.  Exam reveals no friction rub.   No murmur heard.      Radial and Dorsalis Pedis pulses normal  Pulmonary/Chest: Effort normal and breath sounds normal. No respiratory distress. She has no wheezes. She has no rales.       No chest wall deformity with normal A-P diameter. Breast exam: deferred to gynecologist Abdominal: Soft. Bowel sounds are normal. She exhibits no distension and no mass. There is no tenderness. There is no guarding.       No hepatosplenomegaly  Genitourinary:       Exam deferred to gyn  Musculoskeletal: Normal range of motion. She exhibits no edema and no tenderness.       No joint swelling, no synovial thickening, no deformity of the small, medium or large joints.  Lymphadenopathy:    She has no cervical adenopathy.  Neurological: She is alert and oriented to person, place, and time. She has normal reflexes. No cranial nerve deficit. Coordination normal.       Normal facial symmetry, normal gross motor strength throughout, no tremor or cogwheeling, normal rapid finger movement.  Skin: Skin is warm and dry. No rash noted. No erythema.       No suspicious lesions. Normal turgor. Nails are normal.  Psychiatric: She has a normal mood and affect. Her behavior is normal. Thought content normal.       Normal recall, normal clock  face exercise.  Lab Results  Component Value Date   WBC 6.0 11/19/2010   HGB 14.2 11/19/2010   HCT 41.9 11/19/2010   PLT 296.0 11/19/2010   CHOL 199 11/19/2010   TRIG 128.0 11/19/2010   HDL 64.50 11/19/2010   LDLDIRECT 175.3 04/24/2008   ALT 23 11/19/2010   ALT 23 11/19/2010   AST 21 11/19/2010   AST 21 11/19/2010   NA 140 11/19/2010   K 4.5 11/19/2010   CL 103 11/19/2010   CREATININE 0.8 11/19/2010   BUN 17 11/19/2010   CO2 31 11/19/2010   TSH 1.39 11/19/2010   Lab Results  Component Value Date   LDLCALC 109* 11/19/2010  Assessment & Plan:  1. Hyperlipidemia - good control on present regimen. Will continue the same  2. GERD - decreased control on prilosec.  Plan - will her her try H2 blocker therapy, i.e. Ranitidine 150 mg bid.   3. Health maintenance - interval history is normal. She has a normal limited exam with normal pelvic/pap and breast exam by her report done by Gyn. Her lab results are in normal limits including well controlled cholesterol values. She is current with mammography. She is current with colorectal cancer screening with last exam in '06. Immunizations - Tdap and pneumonia vaccine today. She will check on coverage for shingles vaccine. 12 lead EKG without signs of ischemia or injury.   In summary - a very nice woman who appears to be medically stable. She is counseled to have a regular exercise program such as walking 30 minutes 3 times a week. She is counseeld to have a routine opthalmology exam. She will return as needed or in 1 year.

## 2010-11-21 ENCOUNTER — Encounter: Payer: Self-pay | Admitting: Internal Medicine

## 2011-01-12 ENCOUNTER — Other Ambulatory Visit: Payer: Self-pay | Admitting: *Deleted

## 2011-01-12 MED ORDER — SIMVASTATIN 20 MG PO TABS: 20.0000 mg | ORAL_TABLET | Freq: Every day | ORAL | Status: DC

## 2011-03-24 ENCOUNTER — Other Ambulatory Visit: Payer: Self-pay | Admitting: Internal Medicine

## 2011-03-24 DIAGNOSIS — Z1231 Encounter for screening mammogram for malignant neoplasm of breast: Secondary | ICD-10-CM

## 2011-04-23 ENCOUNTER — Ambulatory Visit (INDEPENDENT_AMBULATORY_CARE_PROVIDER_SITE_OTHER): Payer: Medicare HMO | Admitting: Internal Medicine

## 2011-04-23 ENCOUNTER — Encounter: Payer: Self-pay | Admitting: Internal Medicine

## 2011-04-23 VITALS — BP 120/84 | HR 80 | Temp 98.1°F | Ht 62.0 in

## 2011-04-23 DIAGNOSIS — H9209 Otalgia, unspecified ear: Secondary | ICD-10-CM

## 2011-04-23 DIAGNOSIS — H9202 Otalgia, left ear: Secondary | ICD-10-CM

## 2011-04-23 DIAGNOSIS — J301 Allergic rhinitis due to pollen: Secondary | ICD-10-CM

## 2011-04-23 DIAGNOSIS — K0381 Cracked tooth: Secondary | ICD-10-CM

## 2011-04-23 DIAGNOSIS — Z23 Encounter for immunization: Secondary | ICD-10-CM

## 2011-04-23 MED ORDER — LORATADINE 10 MG PO TABS: 10.0000 mg | ORAL_TABLET | Freq: Every day | ORAL | Status: DC

## 2011-04-23 NOTE — Patient Instructions (Signed)
It was good to see you today. Ear looks fine - You should follow up with dentist for your tooth to help pain symptoms  Use generic claritin daily x 2 weeks for allergy and congestion symptoms

## 2011-04-23 NOTE — Progress Notes (Signed)
  Subjective:    Patient ID: [name], female    DOB: 1945/08/27, 65 y.o.   MRN: [id number]  HPI Comments: Known "cracked tooth" on left side - has declined crown at last dental visit (2 weeks ago)  Otalgia  There is pain in the left ear. This is a new problem. Episode onset: 3 weeks ago. The problem occurs constantly. The problem has been gradually worsening. There has been no fever. The pain is at a severity of 3/10. The pain is mild. Associated symptoms include drainage and rhinorrhea. Pertinent negatives include no ear discharge, headaches, hearing loss, neck pain, rash or sore throat. She has tried acetaminophen for the symptoms. The treatment provided mild relief.   PMH reviewed - see EMR chart  Review of Systems  HENT: Positive for ear pain and rhinorrhea. Negative for hearing loss, sore throat, neck pain and ear discharge.   Skin: Negative for rash.  Neurological: Negative for headaches.       Objective:   Physical Exam  Constitutional: She appears well-developed and well-nourished. No distress.  HENT:  Head: Normocephalic and atraumatic.  Right Ear: External ear normal.  Left Ear: External ear normal.  Mouth/Throat: Oropharynx is clear and moist. No oropharyngeal exudate.  Eyes: Conjunctivae and EOM are normal. Pupils are equal, round, and reactive to light.  Neck: Normal range of motion. Neck supple.  Cardiovascular: Normal rate, regular rhythm and normal heart sounds.  Exam reveals no friction rub.   No murmur heard. Pulmonary/Chest: Effort normal and breath sounds normal. No respiratory distress. She has no wheezes.  Skin: She is not diaphoretic.   Lab Results  Component Value Date   WBC 6.0 11/19/2010   HGB 14.2 11/19/2010   HCT 41.9 11/19/2010   PLT 296.0 11/19/2010   GLUCOSE 106* 11/19/2010   CHOL 199 11/19/2010   TRIG 128.0 11/19/2010   HDL 64.50 11/19/2010   LDLDIRECT 175.3 04/24/2008   LDLCALC 109* 11/19/2010   ALT 23 11/19/2010   ALT 23 11/19/2010   AST 21  11/19/2010   AST 21 11/19/2010   NA 140 11/19/2010   K 4.5 11/19/2010   CL 103 11/19/2010   CREATININE 0.8 11/19/2010   BUN 17 11/19/2010   CO2 31 11/19/2010   TSH 1.39 11/19/2010        Assessment & Plan:  L ear pain - Ear exam benign - suspect dental source of pain -advised follow up and dental repair as advised by dentist 2 weeks ago  allergic rhinitis - advise loratadine for 2 weeks to help with symptoms

## 2011-04-25 ENCOUNTER — Emergency Department (HOSPITAL_COMMUNITY)
Admission: EM | Admit: 2011-04-25 | Discharge: 2011-04-25 | Disposition: A | Discharge status: Home / Self Care | Payer: No Typology Code available for payment source | Attending: Emergency Medicine | Admitting: Emergency Medicine

## 2011-04-25 DIAGNOSIS — IMO0002 Reserved for concepts with insufficient information to code with codable children: Secondary | ICD-10-CM | POA: Insufficient documentation

## 2011-04-25 DIAGNOSIS — Z79899 Other long term (current) drug therapy: Secondary | ICD-10-CM | POA: Insufficient documentation

## 2011-04-30 ENCOUNTER — Encounter: Payer: Self-pay | Admitting: Obstetrics and Gynecology

## 2011-04-30 ENCOUNTER — Ambulatory Visit (INDEPENDENT_AMBULATORY_CARE_PROVIDER_SITE_OTHER): Payer: Medicare HMO | Admitting: Internal Medicine

## 2011-04-30 ENCOUNTER — Ambulatory Visit (INDEPENDENT_AMBULATORY_CARE_PROVIDER_SITE_OTHER): Payer: Medicare HMO | Admitting: Obstetrics and Gynecology

## 2011-04-30 VITALS — BP 138/76 | HR 79 | Temp 98.1°F | Wt 150.0 lb

## 2011-04-30 VITALS — BP 134/76 | HR 83 | Ht 59.0 in | Wt 149.0 lb

## 2011-04-30 DIAGNOSIS — M542 Cervicalgia: Secondary | ICD-10-CM

## 2011-04-30 DIAGNOSIS — Z01419 Encounter for gynecological examination (general) (routine) without abnormal findings: Secondary | ICD-10-CM

## 2011-04-30 NOTE — Patient Instructions (Signed)
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
## 2011-04-30 NOTE — Progress Notes (Addendum)
  Subjective:    Patient ID: [name], female    DOB: 1946/03/24, 65 y.o.   MRN: [id number]  HPI 65 yo G3P3 presenting today for annual exam. Patient is without any complaints. She is scheduled for screening mammography on May 05, 2011. She has had a colonoscopy in 2006. She already received her flu vaccine recently and pneumococcal booster in May 2012. Patient is sexually active without any complaints. Reports occasional vaginal dryness relieved with over the counter lubricants. Patient with h/o abnormal pap smear but all vaginal pap smear has been normal since her hysterectomy in 1991.  Past Medical History  Diagnosis Date  . Peritonitis (acute) generalized   . GERD (gastroesophageal reflux disease)   . CTS (carpal tunnel syndrome)   . Knee pain, right   . Hyperlipidemia   . Allergy   . Abnormal Pap smear     h/o of CIN1    Past Surgical History  Procedure Date  . Nasal septum surgery 70's  . Tubal ligation 1991  . Cholecystectomy 2006    laproscopic w/ complications  . Abdominal hysterectomy 1991    with BSO  . Wisdom tooth extraction     Family History  Problem Relation Age of Onset  . Coronary artery disease Mother   . Other Mother     cva  . Hypertension Mother   . Heart disease Mother   . Other Father     CVA  . Stroke Father   . Diabetes Other   . Coronary artery disease Other   . Hypertension Other   . Stroke Other   . Cancer Other     breast  . Cancer Sister     breast  . Cancer Brother     prancreatic  . Cancer Sister     breast   History  Substance Use Topics  . Smoking status: Never Smoker   . Smokeless tobacco: Not on file  . Alcohol Use: No    Review of Systems  All other systems reviewed and are negative.       Objective:   Physical Exam  GENERAL: Well-developed, well-nourished female in no acute distress.  HEENT: Normocephalic, atraumatic. Sclerae anicteric.  NECK: Supple. Normal thyroid.  LUNGS: Clear to auscultation  bilaterally.  HEART: Regular rate and rhythm. BREASTS: Symmetric in size. No palpable masses or lymphadenopathy, skin changes, or nipple drainage. ABDOMEN: Soft, nontender, nondistended. No organomegaly. PELVIC: Normal external female genitalia. Vagina is slightly atrophic. No adnexal mass or tenderness. EXTREMITIES: No cyanosis, clubbing, or edema, 2+ distal pulses.     Assessment & Plan:  65 yo G3P3 here for annual exam - Patient is uptodate with her health care maintenance - Patient to follow-up with primary care physician for management of her hyperlipidemia and GERD. - Patient to RTC in 1 year or PRN

## 2011-04-30 NOTE — Patient Instructions (Addendum)
Neck pain - you have a normal exam with good range of motion of the neck and no signs of a ruptured disk or nerve impingement. You can expect to be sore for up to 2 weeks. Plan - use tylenol 1000 mg three times a day for relief of discomfort. . It is IMPORTANT that you do daily neck exercises even if just simple neck rolls.   Call for worsening symptoms.

## 2011-05-05 ENCOUNTER — Ambulatory Visit (HOSPITAL_COMMUNITY)
Admission: RE | Admit: 2011-05-05 | Discharge: 2011-05-05 | Disposition: A | Discharge status: Home / Self Care | Payer: Medicare HMO | Source: Ambulatory Visit | Attending: Internal Medicine | Admitting: Internal Medicine

## 2011-05-05 DIAGNOSIS — Z1231 Encounter for screening mammogram for malignant neoplasm of breast: Secondary | ICD-10-CM

## 2011-05-25 ENCOUNTER — Ambulatory Visit (INDEPENDENT_AMBULATORY_CARE_PROVIDER_SITE_OTHER): Payer: Medicare HMO | Admitting: Internal Medicine

## 2011-05-25 ENCOUNTER — Ambulatory Visit (INDEPENDENT_AMBULATORY_CARE_PROVIDER_SITE_OTHER)
Admission: RE | Admit: 2011-05-25 | Discharge: 2011-05-25 | Disposition: A | Discharge status: Home / Self Care | Payer: Medicare HMO | Source: Ambulatory Visit | Attending: Internal Medicine | Admitting: Internal Medicine

## 2011-05-25 DIAGNOSIS — M545 Low back pain: Secondary | ICD-10-CM

## 2011-05-25 DIAGNOSIS — M542 Cervicalgia: Secondary | ICD-10-CM
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
## 2011-05-26 ENCOUNTER — Encounter: Payer: Self-pay | Admitting: Internal Medicine

## 2011-08-26 ENCOUNTER — Other Ambulatory Visit: Payer: Self-pay | Admitting: Internal Medicine

## 2011-08-28 ENCOUNTER — Ambulatory Visit (INDEPENDENT_AMBULATORY_CARE_PROVIDER_SITE_OTHER)
Admission: RE | Admit: 2011-08-28 | Discharge: 2011-08-28 | Disposition: A | Discharge status: Home / Self Care | Payer: Medicare HMO | Source: Ambulatory Visit | Attending: Internal Medicine | Admitting: Internal Medicine

## 2011-08-28 ENCOUNTER — Encounter: Payer: Self-pay | Admitting: Internal Medicine

## 2011-08-28 ENCOUNTER — Ambulatory Visit (INDEPENDENT_AMBULATORY_CARE_PROVIDER_SITE_OTHER): Payer: Medicare HMO | Admitting: Internal Medicine

## 2011-08-28 VITALS — BP 126/80 | HR 85 | Temp 97.7°F | Ht 59.0 in | Wt 150.8 lb

## 2011-08-28 DIAGNOSIS — M25522 Pain in left elbow: Secondary | ICD-10-CM

## 2011-08-28 DIAGNOSIS — M25529 Pain in unspecified elbow: Secondary | ICD-10-CM

## 2011-08-28 MED ORDER — TRAMADOL HCL 50 MG PO TABS: 50.0000 mg | ORAL_TABLET | Freq: Three times a day (TID) | ORAL | Status: AC | PRN

## 2011-08-28 NOTE — Patient Instructions (Signed)
It was good to see you today. X-rays of elbow today. you will be called with these results after review Place ice pack to painful elbow 3 times a day for 10 minutes at a time Use Tylenol or prescription tramadol as needed for pain symptoms - Your prescription(s) have been submitted to your pharmacy. Please take as directed and contact our office if you believe you are having problem(s) with the medication(s). If symptoms of pain or unimproved after 2 weeks, or if worse, call for referral to physical therapy

## 2011-08-28 NOTE — Progress Notes (Signed)
  Subjective:    Patient ID: [name], female    DOB: [birth year], 66 y.o.   MRN: [id number]  HPI  complains of R elbow pain Onset 6 days ago Precipitated by direct trauma  - accidentally hit elbow against the vanity  Not improved with heat or OTC ointments Pain exacerbated by lifting anything with left hand  Past Medical History  Diagnosis Date  . Peritonitis (acute) generalized   . GERD (gastroesophageal reflux disease)   . CTS (carpal tunnel syndrome)   . Knee pain, right   . Hyperlipidemia   . Allergy   . Abnormal Pap smear     h/o of CIN1    Review of Systems  Constitutional: Negative for fever and fatigue.  Respiratory: Positive for shortness of breath (occassional in AM, not exertional). Negative for cough.   Cardiovascular: Negative for chest pain and palpitations.       Objective:   Physical Exam BP 126/80  Pulse 85  Temp(Src) 97.7 F (36.5 C) (Oral)  Ht 4\' 11"  (1.499 m)  Wt 150 lb 12.8 oz (68.402 kg)  BMI 30.46 kg/m2  SpO2 97% Gen: NAD Lungs: CTA B, no W/C CV: RRR Mskel: Mild swelling and tenderness over left lateral epicondyle -tender over ECRB, full range of motion       Assessment & Plan:   Left elbow pain, suspect post traumatic ECRB tendinitis -   Check x-ray Advise ice Tramadol as needed for pain and Tylenol Activity as tolerated, avoid heavy lifting  If unimproved in 2-4 weeks, consider physical therapy

## 2011-10-13 ENCOUNTER — Encounter: Payer: Self-pay | Admitting: Internal Medicine

## 2011-10-13 ENCOUNTER — Ambulatory Visit (INDEPENDENT_AMBULATORY_CARE_PROVIDER_SITE_OTHER): Payer: Medicare HMO | Admitting: Internal Medicine

## 2011-10-13 DIAGNOSIS — R079 Chest pain, unspecified: Secondary | ICD-10-CM

## 2011-10-13 DIAGNOSIS — R0789 Other chest pain: Secondary | ICD-10-CM | POA: Insufficient documentation

## 2011-10-13 MED ORDER — DEXLANSOPRAZOLE 30 MG PO CPDR: 30.0000 mg | DELAYED_RELEASE_CAPSULE | Freq: Every day | ORAL | Status: DC

## 2011-10-13 MED ORDER — SIMETHICONE 80 MG PO CHEW: 80.0000 mg | CHEWABLE_TABLET | Freq: Four times a day (QID) | ORAL | Status: DC | PRN

## 2011-10-13 NOTE — Assessment & Plan Note (Addendum)
Stress test To ER if bad EC ASA 81 until stress test

## 2011-10-13 NOTE — Patient Instructions (Signed)
EC aspirin 81 mg/d until the stress test results are out

## 2011-10-13 NOTE — Progress Notes (Signed)
  Subjective:    Patient ID: [name], female    DOB: 09-05-1945, 66 y.o.   MRN: [id number]  HPI  C/o CP and GERD sx's over past 2 wks  Review of Systems  Constitutional: Negative for chills, activity change, appetite change, fatigue and unexpected weight change.  HENT: Negative for congestion, mouth sores, sinus pressure and ear discharge.   Eyes: Negative for visual disturbance.  Respiratory: Positive for chest tightness. Negative for cough and shortness of breath.   Cardiovascular: Positive for chest pain.  Gastrointestinal: Negative for nausea, abdominal pain and diarrhea.  Genitourinary: Negative for frequency, difficulty urinating and vaginal pain.  Musculoskeletal: Negative for back pain and gait problem.  Skin: Negative for pallor and rash.  Neurological: Negative for dizziness, tremors, weakness, numbness and headaches.  Psychiatric/Behavioral: Negative for confusion and sleep disturbance. The patient is not nervous/anxious.     Wt Readings from Last 3 Encounters:  10/13/11 151 lb (68.493 kg)  08/28/11 150 lb 12.8 oz (68.402 kg)  05/25/11 151 lb (68.493 kg)   BP Readings from Last 3 Encounters:  10/13/11 134/98  08/28/11 126/80  05/25/11 124/84        Objective:   Physical Exam  Constitutional: She appears well-developed. No distress.  HENT:  Head: Normocephalic.  Right Ear: External ear normal.  Left Ear: External ear normal.  Nose: Nose normal.  Mouth/Throat: Oropharynx is clear and moist.  Eyes: Conjunctivae are normal. Pupils are equal, round, and reactive to light. Right eye exhibits no discharge. Left eye exhibits no discharge.  Neck: Normal range of motion. Neck supple. No JVD present. No tracheal deviation present. No thyromegaly present.  Cardiovascular: Normal rate, regular rhythm and normal heart sounds.   Pulmonary/Chest: No stridor. No respiratory distress. She has no wheezes.  Abdominal: Soft. Bowel sounds are normal. She exhibits no  distension and no mass. There is no tenderness. There is no rebound and no guarding.  Musculoskeletal: She exhibits no edema and no tenderness.  Lymphadenopathy:    She has no cervical adenopathy.  Neurological: She displays normal reflexes. No cranial nerve deficit. She exhibits normal muscle tone. Coordination normal.  Skin: No rash noted. No erythema.  Psychiatric: She has a normal mood and affect. Her behavior is normal. Judgment and thought content normal.     Lab Results  Component Value Date   WBC 6.0 11/19/2010   HGB 14.2 11/19/2010   HCT 41.9 11/19/2010   PLT 296.0 11/19/2010   GLUCOSE 106* 11/19/2010   CHOL 199 11/19/2010   TRIG 128.0 11/19/2010   HDL 64.50 11/19/2010   LDLDIRECT 175.3 04/24/2008   LDLCALC 109* 11/19/2010   ALT 23 11/19/2010   ALT 23 11/19/2010   AST 21 11/19/2010   AST 21 11/19/2010   NA 140 11/19/2010   K 4.5 11/19/2010   CL 103 11/19/2010   CREATININE 0.8 11/19/2010   BUN 17 11/19/2010   CO2 31 11/19/2010   TSH 1.39 11/19/2010        Assessment & Plan:

## 2011-10-27 ENCOUNTER — Encounter (HOSPITAL_COMMUNITY): Payer: Self-pay | Admitting: *Deleted

## 2011-10-27 ENCOUNTER — Ambulatory Visit (HOSPITAL_COMMUNITY): Payer: Medicare HMO | Attending: Cardiology

## 2011-10-27 ENCOUNTER — Encounter: Payer: Self-pay | Admitting: Internal Medicine

## 2011-10-27 ENCOUNTER — Ambulatory Visit (HOSPITAL_BASED_OUTPATIENT_CLINIC_OR_DEPARTMENT_OTHER): Payer: Medicare HMO

## 2011-10-27 DIAGNOSIS — E785 Hyperlipidemia, unspecified: Secondary | ICD-10-CM | POA: Insufficient documentation

## 2011-10-27 DIAGNOSIS — R0609 Other forms of dyspnea: Secondary | ICD-10-CM | POA: Insufficient documentation

## 2011-10-27 DIAGNOSIS — R0989 Other specified symptoms and signs involving the circulatory and respiratory systems: Secondary | ICD-10-CM

## 2011-10-27 DIAGNOSIS — R072 Precordial pain: Secondary | ICD-10-CM | POA: Insufficient documentation

## 2011-10-27 DIAGNOSIS — R0789 Other chest pain: Secondary | ICD-10-CM

## 2011-10-28 ENCOUNTER — Telehealth: Payer: Self-pay | Admitting: Internal Medicine

## 2011-10-28 NOTE — Telephone Encounter (Signed)
Pt informed

## 2011-10-28 NOTE — Telephone Encounter (Signed)
[name], please, inform patient that her stress test was ok Keep ROV w/Dr Norins Thx

## 2011-11-09 ENCOUNTER — Ambulatory Visit (INDEPENDENT_AMBULATORY_CARE_PROVIDER_SITE_OTHER): Payer: Medicare HMO | Admitting: Internal Medicine

## 2011-11-09 ENCOUNTER — Encounter: Payer: Self-pay | Admitting: Internal Medicine

## 2011-11-09 VITALS — BP 124/80 | HR 66 | Temp 97.9°F | Resp 16 | Wt 152.0 lb

## 2011-11-09 DIAGNOSIS — R0789 Other chest pain: Secondary | ICD-10-CM

## 2011-11-09 DIAGNOSIS — K219 Gastro-esophageal reflux disease without esophagitis: Secondary | ICD-10-CM

## 2011-11-09 MED ORDER — OMEPRAZOLE 40 MG PO CPDR: 40.0000 mg | DELAYED_RELEASE_CAPSULE | Freq: Every day | ORAL | Status: DC

## 2011-11-09 NOTE — Assessment & Plan Note (Signed)
Continued symptoms on zantac.  Plan  Omeprazole 40 mg qAM  Zantac in PM if needed  Mylanta II for gas and breakthrough reflux

## 2011-11-09 NOTE — Patient Instructions (Signed)
Stress echocardiogram was negative for any signs of coronary blockage (ischemia). The pain may in fact be due to suboptimally controlled reflux. Plan - omeprazole 40 mg every AM and you may take a zantac at night if needed.           For gas - Mylanta II may be very helpful - liquid antacid with mylicon (an antigas agent)

## 2011-11-09 NOTE — Progress Notes (Signed)
  Subjective:    Patient ID: [name], female    DOB: 23-Apr-1946, 66 y.o.   MRN: [id number]  HPI Seen two week ago by Dr. Roena Malady for chest pain that was atypical. She had a stress echo October 27, 2011: Study Conclusions  - Stress ECG conclusions: There were no stress arrhythmias or conduction abnormalities. The stress ECG was normal. - Staged echo: There was no echocardiographic evidence for stress-induced ischemia. Impressions:  - Stress echo with no chest pain, no ECG changes and no stress-induced wall motion abnormalities.  She is still having some SSCP/reflux despite bid ranitidine. She had good relief in the past with nexium.  Past Medical History  Diagnosis Date  . Peritonitis (acute) generalized   . GERD (gastroesophageal reflux disease)   . CTS (carpal tunnel syndrome)   . Knee pain, right   . Hyperlipidemia   . Allergy   . Abnormal Pap smear     h/o of CIN1   Past Surgical History  Procedure Date  . Nasal septum surgery 70's  . Tubal ligation 1991  . Cholecystectomy 2006    laproscopic w/ complications  . Abdominal hysterectomy 1991    with BSO  . Wisdom tooth extraction    Family History  Problem Relation Age of Onset  . Coronary artery disease Mother   . Other Mother     cva  . Hypertension Mother   . Heart disease Mother   . Other Father     CVA  . Stroke Father   . Diabetes Other   . Coronary artery disease Other   . Hypertension Other   . Stroke Other   . Cancer Other     breast  . Cancer Sister     breast  . Cancer Brother     prancreatic  . Cancer Sister     breast   History   Social History  . Marital Status: Married    Spouse Name: N/A    Number of Children: N/A  . Years of Education: N/A   Occupational History  . Not on file.   Social History Main Topics  . Smoking status: Never Smoker   . Smokeless tobacco: Not on file  . Alcohol Use: No  . Drug Use: No  . Sexually Active: Not on file   Other Topics Concern  . Not on  file   Social History Narrative   Married '68. 2 son - '69, '70; 1 son - '71: 2 grandchildren. 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       Review of Systems System review is negative for any constitutional, cardiac, pulmonary, GI or neuro symptoms or complaints other than as described in the HPI.     Objective:   Physical Exam Filed Vitals:   11/09/11 1607  BP: 124/80  Pulse: 66  Temp: 97.9 F (36.6 C)  Resp: 16   Wt Readings from Last 3 Encounters:  11/09/11 152 lb (68.947 kg)  10/13/11 151 lb (68.493 kg)  08/28/11 150 lb 12.8 oz (68.402 kg)   Gen'l- overweight AA woman in no distress Chest- CTAP Cor 2+ radial - RRR Abd - BS+, no guarding, rebound or tenderness to deep palpation.       Assessment & Plan:  Atypical chest pain - Stress  Echocardiogram reviewed with patient - negative study. No further evaluation needed.

## 2012-02-17 ENCOUNTER — Telehealth: Payer: Self-pay | Admitting: Internal Medicine

## 2012-02-17 ENCOUNTER — Ambulatory Visit (INDEPENDENT_AMBULATORY_CARE_PROVIDER_SITE_OTHER): Payer: Medicare HMO | Admitting: Internal Medicine

## 2012-02-17 ENCOUNTER — Other Ambulatory Visit (INDEPENDENT_AMBULATORY_CARE_PROVIDER_SITE_OTHER): Payer: Medicare HMO

## 2012-02-17 ENCOUNTER — Encounter: Payer: Self-pay | Admitting: Internal Medicine

## 2012-02-17 VITALS — BP 120/80 | HR 81 | Temp 99.5°F | Resp 16 | Wt 152.8 lb

## 2012-02-17 DIAGNOSIS — R7301 Impaired fasting glucose: Secondary | ICD-10-CM | POA: Insufficient documentation

## 2012-02-17 DIAGNOSIS — E785 Hyperlipidemia, unspecified: Secondary | ICD-10-CM

## 2012-02-17 DIAGNOSIS — R7309 Other abnormal glucose: Secondary | ICD-10-CM

## 2012-02-17 DIAGNOSIS — K219 Gastro-esophageal reflux disease without esophagitis: Secondary | ICD-10-CM

## 2012-02-17 LAB — COMPREHENSIVE METABOLIC PANEL
ALT: 31 U/L (ref 0–35)
AST: 26 U/L (ref 0–37)
Albumin: 4.1 g/dL (ref 3.5–5.2)
CO2: 32 mEq/L (ref 19–32)
Calcium: 9.7 mg/dL (ref 8.4–10.5)
Chloride: 103 mEq/L (ref 96–112)
Creatinine, Ser: 0.7 mg/dL (ref 0.4–1.2)
GFR: 102.46 mL/min (ref >60.00)
Potassium: 4.1 mEq/L (ref 3.5–5.1)
Total Protein: 7.1 g/dL (ref 6.0–8.3)

## 2012-02-17 LAB — CBC WITH DIFFERENTIAL/PLATELET
Basophils Absolute: 0 10*3/uL (ref 0.0–0.1)
Eosinophils Absolute: 0.1 10*3/uL (ref 0.0–0.7)
Lymphocytes Relative: 32.3 % (ref 12.0–46.0)
MCHC: 32.5 g/dL (ref 30.0–36.0)
Monocytes Relative: 9.3 % (ref 3.0–12.0)
Neutro Abs: 3.2 10*3/uL (ref 1.4–7.7)
Neutrophils Relative %: 56.1 % (ref 43.0–77.0)
Platelets: 280 10*3/uL (ref 150.0–400.0)
RDW: 14.1 % (ref 11.5–14.6)

## 2012-02-17 LAB — LIPID PANEL
LDL Cholesterol: 102 mg/dL — ABNORMAL HIGH (ref 0–99)
Total CHOL/HDL Ratio: 3
Triglycerides: 120 mg/dL (ref 0.0–149.0)

## 2012-02-17 LAB — TSH: TSH: 1.48 u[IU]/mL (ref 0.35–5.50)

## 2012-02-17 MED ORDER — ESOMEPRAZOLE MAGNESIUM 40 MG PO CPDR: 40.0000 mg | DELAYED_RELEASE_CAPSULE | Freq: Every day | ORAL | Status: DC

## 2012-02-17 NOTE — Assessment & Plan Note (Signed)
I will check her a1c to see if she has developed DM II 

## 2012-02-17 NOTE — Progress Notes (Signed)
Subjective:    Patient ID: [name], female    DOB: [birth year], 66 y.o.   MRN: [id number]  Gastrophageal Reflux She complains of belching, choking, globus sensation, heartburn and water brash. She reports no abdominal pain, no chest pain, no coughing, no dysphagia, no early satiety, no hoarse voice, no nausea, no sore throat, no stridor, no tooth decay or no wheezing. This is a recurrent problem. The current episode started more than 1 year ago. The problem occurs frequently. The problem has been gradually worsening. The heartburn duration is several minutes. The heartburn is located in the substernum. The heartburn is of mild intensity. The heartburn wakes her from sleep. The heartburn does not limit her activity. The heartburn doesn't change with position. Nothing aggravates the symptoms. Pertinent negatives include no anemia, fatigue, melena, muscle weakness, orthopnea or weight loss. She has tried a PPI, a histamine-2 antagonist and an antacid (mylanta, zantac, and prilosec) for the symptoms. The treatment provided mild relief.      Review of Systems  Constitutional: Negative.  Negative for weight loss and fatigue.  HENT: Negative.  Negative for sore throat and hoarse voice.   Eyes: Negative.   Respiratory: Positive for choking. Negative for apnea, cough, chest tightness, shortness of breath, wheezing and stridor.   Cardiovascular: Negative for chest pain, palpitations and leg swelling.  Gastrointestinal: Positive for heartburn. Negative for dysphagia, nausea, vomiting, abdominal pain, diarrhea, constipation, blood in stool, melena and anal bleeding.  Genitourinary: Negative.   Musculoskeletal: Negative.  Negative for muscle weakness.  Skin: Negative.   Neurological: Negative.   Hematological: Negative for adenopathy. Does not bruise/bleed easily.  Psychiatric/Behavioral: Negative.        Objective:   Physical Exam  Vitals reviewed. Constitutional: She is oriented to person,  place, and time. She appears well-developed and well-nourished. No distress.  HENT:  Head: Normocephalic and atraumatic.  Mouth/Throat: Oropharynx is clear and moist. No oropharyngeal exudate.  Eyes: Conjunctivae are normal. Right eye exhibits no discharge. Left eye exhibits no discharge. No scleral icterus.  Neck: Normal range of motion. Neck supple. No JVD present. No tracheal deviation present. No thyromegaly present.  Cardiovascular: Normal rate, regular rhythm, normal heart sounds and intact distal pulses.  Exam reveals no gallop and no friction rub.   No murmur heard. Pulmonary/Chest: Effort normal and breath sounds normal. No stridor. No respiratory distress. She has no wheezes. She has no rales. She exhibits no tenderness.  Abdominal: Soft. Bowel sounds are normal. She exhibits no distension and no mass. There is no tenderness. There is no rebound and no guarding.  Musculoskeletal: Normal range of motion. She exhibits no edema and no tenderness.  Lymphadenopathy:    She has no cervical adenopathy.  Neurological: She is oriented to person, place, and time.  Skin: Skin is warm and dry. No rash noted. She is not diaphoretic. No erythema. No pallor.  Psychiatric: She has a normal mood and affect. Her behavior is normal. Judgment and thought content normal.     Lab Results  Component Value Date   WBC 6.0 11/19/2010   HGB 14.2 11/19/2010   HCT 41.9 11/19/2010   PLT 296.0 11/19/2010   GLUCOSE 106* 11/19/2010   CHOL 199 11/19/2010   TRIG 128.0 11/19/2010   HDL 64.50 11/19/2010   LDLDIRECT 175.3 04/24/2008   LDLCALC 109* 11/19/2010   ALT 23 11/19/2010   ALT 23 11/19/2010   AST 21 11/19/2010   AST 21 11/19/2010   NA 140 11/19/2010  K 4.5 11/19/2010   CL 103 11/19/2010   CREATININE 0.8 11/19/2010   BUN 17 11/19/2010   CO2 31 11/19/2010   TSH 1.39 11/19/2010       Assessment & Plan:

## 2012-02-17 NOTE — Telephone Encounter (Signed)
Caller: Jelicia/Patient; Patient Name: [name]; PCP: Illene Regulus; Best Callback Phone Number: [phone].  Call regarding Heartburn, Reflux flare-up, Patient is Diagnosed GERD and on Prilosec, no improvement.  Patient complains of Chest Pain that improves after belching, denies Chest Pain at triage.  All emergent symptoms ruled out per Heartburn Protocol.  Appointment scheduled at 2pm on 8-21 due to Diagnosed Heartburn and symptoms not responding to recommended treatment.  Patient verbalized understanding.

## 2012-02-17 NOTE — Assessment & Plan Note (Signed)
FLP CMP TSH today 

## 2012-02-17 NOTE — Patient Instructions (Signed)

## 2012-02-17 NOTE — Assessment & Plan Note (Signed)
She will try nexium and will see GI to see if an upper endoscopy is needed

## 2012-02-18 ENCOUNTER — Encounter: Payer: Self-pay | Admitting: Gastroenterology

## 2012-03-11 ENCOUNTER — Encounter: Payer: Self-pay | Admitting: Gastroenterology

## 2012-03-11 ENCOUNTER — Ambulatory Visit (INDEPENDENT_AMBULATORY_CARE_PROVIDER_SITE_OTHER): Payer: Medicare HMO | Admitting: Gastroenterology

## 2012-03-11 VITALS — BP 136/78 | HR 88 | Ht 59.0 in | Wt 149.0 lb

## 2012-03-11 DIAGNOSIS — K219 Gastro-esophageal reflux disease without esophagitis: Secondary | ICD-10-CM

## 2012-03-11 DIAGNOSIS — Z1211 Encounter for screening for malignant neoplasm of colon: Secondary | ICD-10-CM

## 2012-03-11 DIAGNOSIS — R079 Chest pain, unspecified: Secondary | ICD-10-CM

## 2012-03-11 MED ORDER — ESOMEPRAZOLE MAGNESIUM 40 MG PO CPDR: 40.0000 mg | DELAYED_RELEASE_CAPSULE | Freq: Two times a day (BID) | ORAL | Status: DC

## 2012-03-11 MED ORDER — PEG-KCL-NACL-NASULF-NA ASC-C 100 G PO SOLR: 1.0000 | Freq: Once | ORAL | Status: DC

## 2012-03-11 NOTE — Progress Notes (Signed)
History of Present Illness:  This is a very pleasant 66 year old African American female with chronic gastroesophageal reflux disease despite Nexium 40 mg a day. She complains of worsening burning substernal chest pain, regurgitation, and occasional dysphagia. Her symptoms are worse during the day, but she occasionally has nocturnal problems. Last endoscopic exam and colonoscopy was in 2006. The patient had attempted ERCP by Dr.Brodie in December 2006 which was unremarkable. At that time she had symptomatic cholelithiasis, and underwent cholecystectomy, and had a postoperative bile leakage with prolonged hospitalization from intra-abdominal abscesses. The patient denies any hepatobiliary or lower gastrointestinal symptoms except for some gas and bloating. She specifically denies melena, hematochezia, anorexia or weight loss. She does not smoke or abuse ethanol, cigarettes, or NSAIDs.  Recent labs showed a normal CBC, metabolic profile, liver profile, and renal profile. Family history is noncontributory.   I have reviewed this patient's present history, medical and surgical past history, allergies and medications.     ROS: The remainder of the 10 point ROS is negative... status post previous hysterectomy. She denies nay gynecologic or urologic, cardiovascular, pulmonary, or general medical symptoms at this time.     Physical Exam: Blood pressure 136/78, pulse 88 and regular, and weight 149 pounds with a BMI of 30.09. General well developed well nourished patient in no acute distress, appearing their stated age Eyes PERRLA, no icterus, fundoscopic exam per opthamologist Skin no lesions noted Neck supple, no adenopathy, no thyroid enlargement, no tenderness Chest clear to percussion and auscultation Heart no significant murmurs, gallops or rubs noted Abdomen no hepatosplenomegaly masses or tenderness, BS normal.  Rectal inspection normal no fissures, or fistulae noted.  No masses or tenderness on  digital exam. Stool guaiac negative. Large midline abdominal scar with some keloid formation. Extremities no acute joint lesions, edema, phlebitis or evidence of cellulitis. Neurologic patient oriented x 3, cranial nerves intact, no focal neurologic deficits noted. Psychological mental status normal and normal affect.  Assessment and plan: Probable enlarging hiatal hernia with chronic GERD and possible peptic stricture versus off this. I've scheduled followup endoscopic exam, increased her Nexium to 40 mg twice a day, reviewed reflux maneuvers, and also will do followup colonoscopy for colon cancer screening. Also at the time of endoscopy we'll screen for Barrett's mucosa. She may need esophageal dilation. I've encouraged her to remain on a high fiber diet for her diverticulosis. Please copy this note to Dr. Sanda Linger.  Encounter Diagnoses  Name Primary?  . GERD Yes  . GERD (gastroesophageal reflux disease)   . Chest pain   . Screening for colon cancer

## 2012-03-11 NOTE — Patient Instructions (Addendum)
You have been scheduled for an endoscopy and colonoscopy with propofol. Please follow the written instructions given to you at your visit today. Please pick up your prep at the pharmacy within the next 1-3 days. If you use inhalers (even only as needed), please bring them with you on the day of your procedure. We have sent the following medications to your pharmacy for you to pick up at your convenience: Nexium and increase to twice daily.  CC: Illene Regulus, M. D.

## 2012-03-12 ENCOUNTER — Other Ambulatory Visit: Payer: Self-pay | Admitting: Internal Medicine

## 2012-03-14 ENCOUNTER — Ambulatory Visit (AMBULATORY_SURGERY_CENTER): Payer: Medicare HMO | Admitting: Gastroenterology

## 2012-03-14 ENCOUNTER — Encounter: Payer: Self-pay | Admitting: Gastroenterology

## 2012-03-14 VITALS — BP 137/72 | HR 81 | Temp 97.7°F | Resp 20 | Ht 59.0 in | Wt 149.0 lb

## 2012-03-14 DIAGNOSIS — D126 Benign neoplasm of colon, unspecified: Secondary | ICD-10-CM

## 2012-03-14 DIAGNOSIS — K2970 Gastritis, unspecified, without bleeding: Secondary | ICD-10-CM

## 2012-03-14 DIAGNOSIS — R079 Chest pain, unspecified: Secondary | ICD-10-CM

## 2012-03-14 DIAGNOSIS — K449 Diaphragmatic hernia without obstruction or gangrene: Secondary | ICD-10-CM

## 2012-03-14 DIAGNOSIS — K2990 Gastroduodenitis, unspecified, without bleeding: Secondary | ICD-10-CM

## 2012-03-14 DIAGNOSIS — Z1211 Encounter for screening for malignant neoplasm of colon: Secondary | ICD-10-CM

## 2012-03-14 DIAGNOSIS — K219 Gastro-esophageal reflux disease without esophagitis: Secondary | ICD-10-CM

## 2012-03-14 MED ORDER — SODIUM CHLORIDE 0.9 % IV SOLN: 500.0000 mL | INTRAVENOUS | Status: DC

## 2012-03-14 NOTE — Patient Instructions (Addendum)
Discharge instructions given with verbal understanding. Handouts on polyps and a hiatal hernia given. Resume previous medications. YOU HAD AN ENDOSCOPIC PROCEDURE TODAY AT THE [facility]: Refer to the procedure report that was given to you for any specific questions about what was found during the examination.  If the procedure report does not answer your questions, please call your gastroenterologist to clarify.  If you requested that your care partner not be given the details of your procedure findings, then the procedure report has been included in a sealed envelope for you to review at your convenience later.  YOU SHOULD EXPECT: Some feelings of bloating in the abdomen. Passage of more gas than usual.  Walking can help get rid of the air that was put into your GI tract during the procedure and reduce the bloating. If you had a lower endoscopy (such as a colonoscopy or flexible sigmoidoscopy) you may notice spotting of blood in your stool or on the toilet paper. If you underwent a bowel prep for your procedure, then you may not have a normal bowel movement for a few days.  DIET: Your first meal following the procedure should be a light meal and then it is ok to progress to your normal diet.  A half-sandwich or bowl of soup is an example of a good first meal.  Heavy or fried foods are harder to digest and may make you feel nauseous or bloated.  Likewise meals heavy in dairy and vegetables can cause extra gas to form and this can also increase the bloating.  Drink plenty of fluids but you should avoid alcoholic beverages for 24 hours.  ACTIVITY: Your care partner should take you home directly after the procedure.  You should plan to take it easy, moving slowly for the rest of the day.  You can resume normal activity the day after the procedure however you should NOT DRIVE or use heavy machinery for 24 hours (because of the sedation medicines used during the test).    SYMPTOMS TO REPORT  IMMEDIATELY: A gastroenterologist can be reached at any hour.  During normal business hours, 8:30 AM to 5:00 PM Monday through Friday, call [phone].  After hours and on weekends, please call the GI answering service at [phone] who will take a message and have the physician on call contact you.   Following lower endoscopy (colonoscopy or flexible sigmoidoscopy):  Excessive amounts of blood in the stool  Significant tenderness or worsening of abdominal pains  Swelling of the abdomen that is new, acute  Fever of 100F or higher  Following upper endoscopy (EGD)  Vomiting of blood or coffee ground material  New chest pain or pain under the shoulder blades  Painful or persistently difficult swallowing  New shortness of breath  Fever of 100F or higher  Black, tarry-looking stools  FOLLOW UP: If any biopsies were taken you will be contacted by phone or by letter within the next 1-3 weeks.  Call your gastroenterologist if you have not heard about the biopsies in 3 weeks.  Our staff will call the home number listed on your records the next business day following your procedure to check on you and address any questions or concerns that you may have at that time regarding the information given to you following your procedure. This is a courtesy call and so if there is no answer at the home number and we have not heard from you through the emergency physician on call, we will assume   that you have returned to your regular daily activities without incident.  SIGNATURES/CONFIDENTIALITY: You and/or your care partner have signed paperwork which will be entered into your electronic medical record.  These signatures attest to the fact that that the information above on your After Visit Summary has been reviewed and is understood.  Full responsibility of the confidentiality of this discharge information lies with you and/or your care-partner. 

## 2012-03-14 NOTE — Op Note (Signed)
[facility] [address] [place], [address]   COLONOSCOPY PROCEDURE REPORT  PATIENT: Kimberly, Hebert  MR#: [id number] BIRTHDATE: February 01, 1946 , 66  yrs. old GENDER: Female ENDOSCOPIST: Mardella Layman, MD, [facility] REFERRED BY: PROCEDURE DATE:  03/14/2012 PROCEDURE:   Colonoscopy with biopsy ASA CLASS:   Class II INDICATIONS:average risk patient for colon cancer. MEDICATIONS: propofol (Diprivan) 200mg  IV  DESCRIPTION OF PROCEDURE:   After the risks and benefits and of the procedure were explained, informed consent was obtained.  A digital rectal exam revealed no abnormalities of the rectum.    The LB CF-H180AL [id number]  endoscope was introduced through the anus and advanced to the cecum, which was identified by both the appendix and ileocecal valve .  The quality of the prep was excellent, using MoviPrep .  The instrument was then slowly withdrawn as the colon was fully examined.     COLON FINDINGS: A diminutive polyp was found.  A biopsy was performed using cold forceps.   The colon mucosa was otherwise normal.     Retroflexed views revealed no abnormalities.     The scope was then withdrawn from the patient and the procedure completed.  COMPLICATIONS: There were no complications. ENDOSCOPIC IMPRESSION: 1.   Diminutive polyp was found; biopsy was performed using cold forceps 2.   The colon mucosa was otherwise normal  RECOMMENDATIONS: Repeat colonoscopy in 5 years if polyp adenomatous; otherwise 10 years   REPEAT EXAM:  OZ:[id number] [name], MD  _______________________________ eSigned:  Mardella Layman, MD, [facility] 03/14/2012 3:55 PM

## 2012-03-14 NOTE — Progress Notes (Signed)
Patient did not experience any of the following events: a burn prior to discharge; a fall within the facility; wrong site/side/patient/procedure/implant event; or a hospital transfer or hospital admission upon discharge from the facility. (G8907) Patient did not have preoperative order for IV antibiotic SSI prophylaxis. (G8918)  

## 2012-03-14 NOTE — Op Note (Signed)
[facility] [address] [place], [address]   ENDOSCOPY PROCEDURE REPORT  PATIENT: Kimberly, Hebert  MR#: [id number] BIRTHDATE: 01/26/1946 , 66  yrs. old GENDER: Female ENDOSCOPIST:Analese Sovine Hale Bogus, MD, [facility] REFERRED BY: PROCEDURE DATE:  03/14/2012 PROCEDURE:   EGD w/ biopsy ASA CLASS:    Class II INDICATIONS: chest pain and history of esophageal reflux. MEDICATION: There was residual sedation effect present from prior procedure and propofol (Diprivan) 100mg  IV TOPICAL ANESTHETIC:   Cetacaine Spray  DESCRIPTION OF PROCEDURE:   After the risks and benefits of the procedure were explained, informed consent was obtained.  The LB GIF-H180 [id number]  endoscope was introduced through the mouth  and advanced to the second portion of the duodenum .  The instrument was slowly withdrawn as the mucosa was fully examined.      ESOPHAGUS: The mucosa of the esophagus appeared normal.   A 4 cm hiatal hernia was noted.   No erosions or stricture noted.  STOMACH: Abnormal mucosa was found in the gastric fundus.  The mucosa was nodular.  Multiple biopsies were performed using cold forceps.  Normal duodenum noted.          The scope was then withdrawn from the patient and the procedure completed.  COMPLICATIONS: There were no complications.   ENDOSCOPIC IMPRESSION: 1.   The mucosa of the esophagus appeared normal 2.   4 cm hiatal hernia 3.   Abnormal mucosa was found in the gastric fundus; The mucosa was nodular; multiple biopsies 4.   Normal duodenum noted  RECOMMENDATIONS: 1.  Await pathology results 2.  continue PPI    _______________________________ eSigned:  Mardella Layman, MD, [facility] 03/14/2012 4:06 PM      PATIENT NAME:  Kimberly, Hebert MR#: [id number]

## 2012-03-15 ENCOUNTER — Telehealth: Payer: Self-pay | Admitting: *Deleted

## 2012-03-15 NOTE — Telephone Encounter (Signed)
  Follow up Call-  Call back number 03/14/2012  Post procedure Call Back phone  # [phone]  Permission to leave phone message Yes     Patient questions:  Do you have a fever, pain , or abdominal swelling? no Pain Score  0 *  Have you tolerated food without any problems? yes  Have you been able to return to your normal activities? yes  Do you have any questions about your discharge instructions: Diet   no Medications  no Follow up visit  no  Do you have questions or concerns about your Care? no  Actions: * If pain score is 4 or above: No action needed, pain <4.

## 2012-03-16 ENCOUNTER — Telehealth: Payer: Self-pay | Admitting: Internal Medicine

## 2012-03-16 NOTE — Telephone Encounter (Signed)
Caller: [name]/Patient; Patient Name: [name]; PCP: Tillman Abide [facility]); Best Callback Phone Number: [phone]; Call regarding Pink tissue at mouth of Vagina, not protruding, onset 9-16. Denies Bleeding or pain. Patient thinks it's her Bladder.  All emergent symptoms ruled out per Pelvic Relaxation Protocol, see in 24 hours due to difficulty walking due to prolapse.  Advised Patient to stay off her feet and to call back if symptoms worsen on 9-18.  Per appointment instructions, no blank slot to schedule next day appointment.  Advised Patient to call back on 9-19 for appointment.  Patient verbalized understanding.

## 2012-03-16 NOTE — Telephone Encounter (Signed)
PATIENT TO CALL IN AM TO GET APPT. WITH FIRST AVAILABLE DOCTOR.

## 2012-03-17 ENCOUNTER — Ambulatory Visit (INDEPENDENT_AMBULATORY_CARE_PROVIDER_SITE_OTHER): Payer: Medicare HMO | Admitting: Internal Medicine

## 2012-03-17 ENCOUNTER — Telehealth: Payer: Self-pay | Admitting: Internal Medicine

## 2012-03-17 VITALS — BP 120/78 | HR 70 | Temp 98.5°F | Resp 18 | Wt 150.0 lb

## 2012-03-17 DIAGNOSIS — IMO0002 Reserved for concepts with insufficient information to code with codable children: Secondary | ICD-10-CM | POA: Insufficient documentation

## 2012-03-17 DIAGNOSIS — N8111 Cystocele, midline: Secondary | ICD-10-CM

## 2012-03-17 NOTE — Telephone Encounter (Signed)
Caller: [name]/Patient; Patient Name: [name]; PCP: Illene Regulus (Adults only); Best Callback Phone Number: [phone].  Follow up call from 9-18.  Patient triaged on 9-18, see in 24 hours, needs same day appointment.  Scheduled at 1600 on 9-19 with Dr Debby Bud.  Patient would like to be called if there is a cancellation.

## 2012-03-17 NOTE — Progress Notes (Signed)
Subjective:    Patient ID: [name], female    DOB: [birth year], 66 y.o.   MRN: [id number]  HPI [name], a 66 y/o AA woman s/p hysterectomy presents with a report of a bulge at the entrance of the vagina that gives a pressure like discomfort. She has done a self inspection and did see this smooth bulging structure. She has not had any change in urinary frequency or volumes. She has not had any urinary track infections. She has noticed that when the fullness is there it makes walking a little more awkward.  Past Medical History  Diagnosis Date  . Peritonitis (acute) generalized   . GERD (gastroesophageal reflux disease)   . CTS (carpal tunnel syndrome)   . Knee pain, right   . Hyperlipidemia   . Allergy   . Abnormal Pap smear     h/o of CIN1  . Hiatal hernia    Past Surgical History  Procedure Date  . Nasal septum surgery 70's  . Tubal ligation 1991  . Cholecystectomy 2006    laproscopic w/ complications  . Abdominal hysterectomy 1991    with BSO  . Wisdom tooth extraction    Family History  Problem Relation Age of Onset  . Coronary artery disease Mother   . Other Mother     cva  . Hypertension Mother   . Heart disease Mother   . Other Father     CVA  . Stroke Father   . Diabetes Other   . Coronary artery disease Other   . Hypertension Other   . Stroke Other   . Cancer Other     breast  . Cancer Sister     breast  . Cancer Brother     prancreatic  . Cancer Sister     breast  . Colon cancer Neg Hx    History   Social History  . Marital Status: Married    Spouse Name: [name]    Number of Children: [name]  . Years of Education: [name]   Occupational History  . Not on file.   Social History Main Topics  . Smoking status: Never Smoker   . Smokeless tobacco: Never Used  . Alcohol Use: No  . Drug Use: No  . Sexually Active: Not Currently   Other Topics Concern  . Not on file   Social History Narrative   Married '68. 2 son - '69, '70; 1 son - '71: 2  grandchildren. 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    Current Outpatient Prescriptions on File Prior to Visit  Medication Sig Dispense Refill  . cholecalciferol (D-VI-SOL) 400 UNIT/ML LIQD Take 400 Units by mouth daily.        Marland Kitchen esomeprazole (NEXIUM) 40 MG capsule Take 1 capsule (40 mg total) by mouth 2 (two) times daily.  60 capsule  11  . fish oil-omega-3 fatty acids 1000 MG capsule Take by mouth. Takes but not every day      . simvastatin (ZOCOR) 20 MG tablet TAKE 1 TABLET BY MOUTH AT BEDTIME  30 tablet  6      Review of Systems System review is negative for any constitutional, cardiac, pulmonary, GI or neuro symptoms or complaints other than as described in the HPI.     Objective:   Physical Exam Filed Vitals:   03/17/12 1606  BP: 120/78  Pulse: 70  Temp: 98.5 F (36.9 C)  Resp: 18   WNWD AA woman in no distress  No further exam  Assessment & Plan:

## 2012-03-17 NOTE — Patient Instructions (Addendum)
self-reported bulge at the introitus c/w cyctocele.   Plan   Referral to Dr. Bjorn Pippin for urology consult  Cystocele Repair A cystocele is a bulging, drooping hernia or break (rupture) of bladder tissue into the birth canal (vagina). This bulging or rupture occurs on the top front wall of the vagina. CAUSES   Cystocele is associated with weakness of the top front wall of the vagina due to stretching and tearing of the ligaments and muscles in the area. This is often the result of:  Multiple childbirths.   Continuous heavy lifting.   Chronic cough from asthma, emphysema, or smoking.   Being overweight.   Changes from aging.   Previous surgery in the vaginal area.   Menopause with loss of estrogen hormone and weakening of the ligaments and muscles around the bladder.  SYMPTOMS    Uncontrolled loss of urine (incontinence) with cough, sneeze, or exercise.   Pelvic pressure.   Frequency or urgency to urinate because of inability to completely empty the bladder.   Bladder infections.   Needing to push on the upper vagina to help yourself pass urine.  DIAGNOSIS   A cystocele can be diagnosed by doing a pelvic exam and observing the top of the vagina drooping or bulging into or out of the vagina. TREATMENT   Surgical options:  Cystocele repair is surgery that removes the hernia.   There are also different "sling" operations that may be used.  Discuss the different types of surgeries to repair a cystocele with your caregiver. Your caregiver will decide what type of surgery will be best in your case. Nonsurgical options:  Kegel exercises. This helps strengthen and tighten the muscles and tissue in and around the bladder and vagina. This may help with mild cases of cystocele.   A pessary may help the cystocele. A pessary is a plastic or rubber device that lifts the bladder into place. A pessary must be fitted by a doctor.   Tampons or diaphragms that lift the bladder into place  are sometimes helpful with a minor or small cystocele.   Estrogen may help with mild cases in menopausal and aging women.  LET YOUR CAREGIVER KNOW ABOUT:    Allergies to food or medicine.   Medicines taken, including vitamins, herbs, eyedrops, over-the-counter medicines, and creams.   Use of steroids (by mouth or creams).   Previous problems with anesthetics or numbing medicines.   History of bleeding problems or blood clots.   Previous surgery.   Other health problems, including diabetes and kidney problems.   Possibility of pregnancy, if this applies.  RISKS AND COMPLICATIONS   All surgery is associated with risks.  There are risks with a general anesthesia. You should discuss this with your caregiver.   With spinal or epidural anesthesia, there may be an area that is not numbed, and you could feel pain.   Headache could occur with a spinal or epidural anesthetic.   The catheter you will have after surgery may not work properly or may get blocked and need to be replaced.   Excessive bleeding.   Infection.   Injury to surrounding structures.   Recurrence of the cystocele.   Surgery may not get rid of your symptoms.  BEFORE THE PROCEDURE    Do not take aspirin or blood thinners for 1 week prior to surgery, unless instructed otherwise.   Do not eat or drink anything after midnight the night before surgery.   Let your caregiver know if you  develop a cold or other infectious problems prior to surgery.   If being admitted the day of surgery, you should be present 1 hour prior to your procedure or as directed by your caregiver.   Plan and arrange for help when you go home from the hospital.   If you smoke, do not smoke for at least 2 weeks before the surgery.   Do not drink any alcohol for 3 days before the surgery.  PROCEDURE   You will be given an anesthetic to prevent you from feeling pain during surgery. This may be a general anesthetic that puts you to sleep,  or a spinal or epidural anesthetic. You will be asleep or be numbed through the entire procedure. During cystocele repair, tissue is pulled from the sides and around the top of the vagina to lift up the hernia. This removes the hernia so that the top of the vagina does not fall into the opening of the vagina. AFTER THE PROCEDURE   After surgery, you will be taken to the recovery room where a nurse will take care of you, checking your breathing, blood pressure, pulse, and your progress. When your caregiver feels you are stable, you will be taken to your room. You will have a drainage tube (Foley catheter) that will drain your bladder for 2 to 7 days or longer, until your bladder is working properly. This catheter is placed prior to surgery to help keep your bladder empty and out of the way during the procedure. After surgery, this will make passing your urine easier. The catheter will be removed when you can easily pass urine without this assistance. You may have gauze packing in the vagina that will be removed 1 to 2 days after the surgery. Usually, you will be given a medicine (antibiotic) that kills germs. You will be given pain medicine as needed. You can usually go home in 3 to 5 days. HOME CARE INSTRUCTIONS    Do not take baths. Take showers until your caregiver informs you otherwise.   Take antibiotics as directed by your caregiver.   Exercise as instructed. Do not perform exercises which increase the pressure inside your belly (abdomen), such as sit-ups or lifting weights, until your caregiver has given permission. Walking exercise is preferred.   Only take over-the-counter or prescription medicines for pain and discomfort as directed by your caregiver.   Do not drink alcohol while taking pain medicine.   Do not lift anything over 5 pounds.   Do not drive until your caregiver gives you permission.   Get plenty of rest and sleep.   Have someone help with your household chores for 1 to 2  weeks.   If you develop constipation, you may take a mild laxative with your caregiver's permission. Eating bran foods and drinking enough water and fluids to keep your urine clear or pale yellow helps with constipation.   Do not take aspirin. It may cause bleeding.   You may resume normal diet and unstrenuous activities as directed.   Do not douche, use tampons, or engage in intercourse until your surgeon has given permission.   Change bandages (dressings) as directed.   Make and keep all your postoperative appointments.  SEEK MEDICAL CARE IF:    You have abnormal vaginal discharge.   You develop a rash.   You are having a reaction to your medicine.   You develop nausea or vomiting.  SEEK IMMEDIATE MEDICAL CARE IF:    You have redness, swelling,  or increasing pain in the vaginal area.   You notice pus coming from the vagina.   You have a fever.   You notice a bad smell coming from the vagina.   You have increasing abdominal pain.   You have frequent urination or you notice burning during urination.   You notice blood in your urine.   You have excessive vaginal bleeding.   You cannot urinate.  MAKE SURE YOU:    Understand these instructions.   Will watch your condition.   Will get help right away if you are not doing well or get worse.  Document Released: 06/12/2000 Document Revised: 06/04/2011 Document Reviewed: 09/12/2009 [facility] Patient Information 2012 [place], [place].

## 2012-03-17 NOTE — Assessment & Plan Note (Signed)
self-reported bulge at the introitus c/w cyctocele.   Plan   Referral to Dr. Bjorn Pippin for urology consult

## 2012-03-18 ENCOUNTER — Encounter: Payer: Self-pay | Admitting: Gastroenterology

## 2012-04-13 ENCOUNTER — Telehealth: Payer: Self-pay | Admitting: Gastroenterology

## 2012-04-13 NOTE — Telephone Encounter (Signed)
Left message on machine to call back  

## 2012-04-15 ENCOUNTER — Other Ambulatory Visit: Payer: Self-pay | Admitting: Gastroenterology

## 2012-04-15 DIAGNOSIS — K219 Gastro-esophageal reflux disease without esophagitis: Secondary | ICD-10-CM

## 2012-04-15 DIAGNOSIS — R079 Chest pain, unspecified: Secondary | ICD-10-CM

## 2012-04-15 DIAGNOSIS — Z1211 Encounter for screening for malignant neoplasm of colon: Secondary | ICD-10-CM

## 2012-04-15 NOTE — Telephone Encounter (Signed)
2 bottles of 5 each of nexium left at the front desk Samples of this drug were given to the patient, quantity 10, Lot Number [id number]  03/2014

## 2012-04-18 MED ORDER — ESOMEPRAZOLE MAGNESIUM 40 MG PO CPDR: 40.0000 mg | DELAYED_RELEASE_CAPSULE | Freq: Two times a day (BID) | ORAL | Status: DC

## 2012-04-18 NOTE — Telephone Encounter (Signed)
rx was sent to the pharmacy as requested by pt

## 2012-05-06 ENCOUNTER — Other Ambulatory Visit: Payer: Self-pay | Admitting: Internal Medicine

## 2012-05-06 DIAGNOSIS — Z1231 Encounter for screening mammogram for malignant neoplasm of breast: Secondary | ICD-10-CM

## 2012-05-25 ENCOUNTER — Ambulatory Visit (HOSPITAL_COMMUNITY)
Admission: RE | Admit: 2012-05-25 | Discharge: 2012-05-25 | Disposition: A | Discharge status: Home / Self Care | Payer: Medicare HMO | Source: Ambulatory Visit | Attending: Internal Medicine | Admitting: Internal Medicine

## 2012-05-25 DIAGNOSIS — Z1231 Encounter for screening mammogram for malignant neoplasm of breast: Secondary | ICD-10-CM | POA: Insufficient documentation

## 2012-06-29 HISTORY — PX: BREAST BIOPSY: SHX20

## 2012-07-07 ENCOUNTER — Encounter: Payer: Self-pay | Admitting: Internal Medicine

## 2012-07-07 ENCOUNTER — Ambulatory Visit (INDEPENDENT_AMBULATORY_CARE_PROVIDER_SITE_OTHER): Payer: Medicare HMO | Admitting: Internal Medicine

## 2012-07-07 VITALS — BP 132/80 | HR 81 | Temp 98.1°F | Resp 12 | Wt 152.1 lb

## 2012-07-07 DIAGNOSIS — J069 Acute upper respiratory infection, unspecified: Secondary | ICD-10-CM

## 2012-07-07 MED ORDER — AMOXICILLIN 875 MG PO TABS: 875.0000 mg | ORAL_TABLET | Freq: Two times a day (BID) | ORAL | Status: DC

## 2012-07-07 NOTE — Progress Notes (Signed)
Subjective:    Patient ID: [name], female    DOB: 12-11-1945, 67 y.o.   MRN: [id number]  HPI Starting New Year's eve [name] developed a sore throat. She has not documented a fever but felt feverish. The discomfort is better but still persistent. She also has had sinus drainage of a dark mucus. She has been able to swallow and she has maintained a good appetite. No diarrhea, no abdominal pain.   She continues to see Dr. Patsi Sears for cystocele and urinary function. She may need surgical repair.   Past Medical History  Diagnosis Date  . Peritonitis (acute) generalized   . GERD (gastroesophageal reflux disease)   . CTS (carpal tunnel syndrome)   . Knee pain, right   . Hyperlipidemia   . Allergy   . Abnormal Pap smear     h/o of CIN1  . Hiatal hernia    Past Surgical History  Procedure Date  . Nasal septum surgery 70's  . Tubal ligation 1991  . Cholecystectomy 2006    laproscopic w/ complications  . Abdominal hysterectomy 1991    with BSO  . Wisdom tooth extraction    Family History  Problem Relation Age of Onset  . Coronary artery disease Mother   . Other Mother     cva  . Hypertension Mother   . Heart disease Mother   . Other Father     CVA  . Stroke Father   . Diabetes Other   . Coronary artery disease Other   . Hypertension Other   . Stroke Other   . Cancer Other     breast  . Cancer Sister     breast  . Cancer Brother     prancreatic  . Cancer Sister     breast  . Colon cancer Neg Hx    History   Social History  . Marital Status: Married    Spouse Name: N/A    Number of Children: N/A  . Years of Education: N/A   Occupational History  . Not on file.   Social History Main Topics  . Smoking status: Never Smoker   . Smokeless tobacco: Never Used  . Alcohol Use: No  . Drug Use: No  . Sexually Active: Not Currently   Other Topics Concern  . Not on file   Social History Narrative   Married '68. 2 son - '69, '70; 1 son - '71: 2  grandchildren. 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    Current Outpatient Prescriptions on File Prior to Visit  Medication Sig Dispense Refill  . cholecalciferol (D-VI-SOL) 400 UNIT/ML LIQD Take 400 Units by mouth daily.        Marland Kitchen esomeprazole (NEXIUM) 40 MG capsule Take 1 capsule (40 mg total) by mouth 2 (two) times daily.  60 capsule  11  . fish oil-omega-3 fatty acids 1000 MG capsule Take by mouth. Takes but not every day      . simvastatin (ZOCOR) 20 MG tablet TAKE 1 TABLET BY MOUTH AT BEDTIME  30 tablet  6      Review of Systems System review is negative for any constitutional, cardiac, pulmonary, GI or neuro symptoms or complaints other than as described in the HPI.     Objective:   Physical Exam Filed Vitals:   07/07/12 1048  BP: 132/80  Pulse: 81  Temp: 98.1 F (36.7 C)  Resp: 12   Gen'l- NWWD AA woman in no distress HEENT - mild sinus tenderness to  percussion. Throat clear Nodes - negative neck and supraclavicular Cor- RRR Pulm - normal respirations, no rales or wheezes.       Assessment & Plan:  Upper respiratory infection with sore throat and sinus pressure.,  Plan Amoxicillin 875 mg twice a day for 7 days  Sudafed (generic) 30 mg twice a day for sinus congestion and pain  Tylenol 500 - 1,000 mg three times a day for aches and fever  Hydrate well, take vitamin C 1500 mg daily

## 2012-07-07 NOTE — Patient Instructions (Addendum)
Upper respiratory infection with sore throat and sinus pressure.,  Plan Amoxicillin 875 mg twice a day for 7 days  Sudafed (generic) 30 mg twice a day for sinus congestion and pain  Tylenol 500 - 1,000 mg three times a day for aches and fever  Hydrate well, take vitamin C 1500 mg daily  Upper Respiratory Infection, Adult An upper respiratory infection (URI) is also sometimes known as the common cold. The upper respiratory tract includes the nose, sinuses, throat, trachea, and bronchi. Bronchi are the airways leading to the lungs. Most people improve within 1 week, but symptoms can last up to 2 weeks. A residual cough may last even longer.   CAUSES Many different viruses can infect the tissues lining the upper respiratory tract. The tissues become irritated and inflamed and often become very moist. Mucus production is also common. A cold is contagious. You can easily spread the virus to others by oral contact. This includes kissing, sharing a glass, coughing, or sneezing. Touching your mouth or nose and then touching a surface, which is then touched by another person, can also spread the virus. SYMPTOMS   Symptoms typically develop 1 to 3 days after you come in contact with a cold virus. Symptoms vary from person to person. They may include:  Runny nose.   Sneezing.   Nasal congestion.   Sinus irritation.   Sore throat.   Loss of voice (laryngitis).   Cough.   Fatigue.   Muscle aches.   Loss of appetite.   Headache.   Low-grade fever.  DIAGNOSIS   You might diagnose your own cold based on familiar symptoms, since most people get a cold 2 to 3 times a year. Your caregiver can confirm this based on your exam. Most importantly, your caregiver can check that your symptoms are not due to another disease such as strep throat, sinusitis, pneumonia, asthma, or epiglottitis. Blood tests, throat tests, and X-rays are not necessary to diagnose a common cold, but they may sometimes be  helpful in excluding other more serious diseases. Your caregiver will decide if any further tests are required. RISKS AND COMPLICATIONS   You may be at risk for a more severe case of the common cold if you smoke cigarettes, have chronic heart disease (such as heart failure) or lung disease (such as asthma), or if you have a weakened immune system. The very young and very old are also at risk for more serious infections. Bacterial sinusitis, middle ear infections, and bacterial pneumonia can complicate the common cold. The common cold can worsen asthma and chronic obstructive pulmonary disease (COPD). Sometimes, these complications can require emergency medical care and may be life-threatening. PREVENTION   The best way to protect against getting a cold is to practice good hygiene. Avoid oral or hand contact with people with cold symptoms. Wash your hands often if contact occurs. There is no clear evidence that vitamin C, vitamin E, echinacea, or exercise reduces the chance of developing a cold. However, it is always recommended to get plenty of rest and practice good nutrition. TREATMENT   Treatment is directed at relieving symptoms. There is no cure. Antibiotics are not effective, because the infection is caused by a virus, not by bacteria. Treatment may include:  Increased fluid intake. Sports drinks offer valuable electrolytes, sugars, and fluids.   Breathing heated mist or steam (vaporizer or shower).   Eating chicken soup or other clear broths, and maintaining good nutrition.   Getting plenty of rest.  Using gargles or lozenges for comfort.   Controlling fevers with ibuprofen or acetaminophen as directed by your caregiver.   Increasing usage of your inhaler if you have asthma.  Zinc gel and zinc lozenges, taken in the first 24 hours of the common cold, can shorten the duration and lessen the severity of symptoms. Pain medicines may help with fever, muscle aches, and throat pain. A variety  of non-prescription medicines are available to treat congestion and runny nose. Your caregiver can make recommendations and may suggest nasal or lung inhalers for other symptoms.   HOME CARE INSTRUCTIONS    Only take over-the-counter or prescription medicines for pain, discomfort, or fever as directed by your caregiver.   Use a warm mist humidifier or inhale steam from a shower to increase air moisture. This may keep secretions moist and make it easier to breathe.   Drink enough water and fluids to keep your urine clear or pale yellow.   Rest as needed.   Return to work when your temperature has returned to normal or as your caregiver advises. You may need to stay home longer to avoid infecting others. You can also use a face mask and careful hand washing to prevent spread of the virus.  SEEK MEDICAL CARE IF:    After the first few days, you feel you are getting worse rather than better.   You need your caregiver's advice about medicines to control symptoms.   You develop chills, worsening shortness of breath, or brown or red sputum. These may be signs of pneumonia.   You develop yellow or brown nasal discharge or pain in the face, especially when you bend forward. These may be signs of sinusitis.   You develop a fever, swollen neck glands, pain with swallowing, or white areas in the back of your throat. These may be signs of strep throat.  SEEK IMMEDIATE MEDICAL CARE IF:    You have a fever.   You develop severe or persistent headache, ear pain, sinus pain, or chest pain.   You develop wheezing, a prolonged cough, cough up blood, or have a change in your usual mucus (if you have chronic lung disease).   You develop sore muscles or a stiff neck.  Document Released: 12/09/2000 Document Revised: 09/07/2011 Document Reviewed: 10/17/2010 [facility] Patient Information 2013 [place], [place].

## 2012-07-18 ENCOUNTER — Other Ambulatory Visit: Payer: Self-pay | Admitting: Internal Medicine

## 2012-07-18 MED ORDER — AZITHROMYCIN 250 MG PO TABS: ORAL_TABLET | ORAL | Status: DC

## 2012-07-18 NOTE — Telephone Encounter (Signed)
Patient Information:  Caller Name: [place]  Phone: [phone]  Patient: [name], [name]  Gender: Female  DOB: 1945/11/16  Age: 67 Years  PCP: Illene Regulus (Adults only)  Office Follow Up:  Does the office need to follow up with this patient?: Yes  Instructions For The Office: Please check with provider regarding possible Rx for her sore throat and colored sputum that did not resolve with Rx Amoxicillin.  Preferred callback to cell phone  [phone].  Uses [facility] [phone]   Symptoms  Reason For Call & Symptoms: Symptoms :  Sore throat, green sputum, thick mucus in sinuses.  Seen 1/9 for URI, on Amoxicillin and she was told to call if symptoms not relieved for possible Rx.  Reports green sputum persists.  Reviewed Health History In EMR: Yes  Reviewed Medications In EMR: Yes  Reviewed Allergies In EMR: Yes  Reviewed Surgeries / Procedures: Yes  Date of Onset of Symptoms: 06/28/2012  Treatments Tried: Amoxicillin,  gargles, Robitussin Multi-Symptom.  Treatments Tried Worked: No  Guideline(s) Used:  Cold Symptoms (Upper Respiratory Infection)  Colds  Sore Throat  Disposition Per Guideline:   Strep Test Only Visit Today or Tomorrow  Reason For Disposition Reached:   Sore throat with cough/cold symptoms present > 5 days  Advice Given:  N/A  Patient Refused Recommendation:  Patient Refused Care Advice  Patient states, "That is another co-pay.  Please check with Dr. Debby Bud first.

## 2012-07-18 NOTE — Telephone Encounter (Signed)
OK for a Z-pak take as directed. If no improvement will need OV>

## 2012-07-18 NOTE — Telephone Encounter (Signed)
Called pt and left message with spouse that we have sent an rx for azithromycin into pharmacy and if no improvement after finishing medication, she will need an OV.

## 2012-07-21 ENCOUNTER — Other Ambulatory Visit: Payer: Self-pay | Admitting: Urology

## 2012-08-01 ENCOUNTER — Other Ambulatory Visit: Payer: Self-pay | Admitting: Urology

## 2012-08-10 ENCOUNTER — Encounter (HOSPITAL_BASED_OUTPATIENT_CLINIC_OR_DEPARTMENT_OTHER): Payer: Self-pay | Admitting: *Deleted

## 2012-08-10 NOTE — Progress Notes (Addendum)
NPO AFTER MN. ARRIVES AT 0830. NEEDS HG. CURRENT EKG IN EPIC AND CHART. WILL TAKE PRILOSEC AM OF SURG W/ SIPS OF WATER. REVIEWED [facility] GUIDELINES.  WILL DO HIBICLENS HS AND AM OF SURG.

## 2012-08-15 ENCOUNTER — Ambulatory Visit (HOSPITAL_BASED_OUTPATIENT_CLINIC_OR_DEPARTMENT_OTHER): Payer: Medicare HMO | Admitting: Anesthesiology

## 2012-08-15 ENCOUNTER — Ambulatory Visit (HOSPITAL_BASED_OUTPATIENT_CLINIC_OR_DEPARTMENT_OTHER)
Admission: RE | Admit: 2012-08-15 | Discharge: 2012-08-16 | Disposition: A | Discharge status: Home / Self Care | Payer: Medicare HMO | Source: Ambulatory Visit | Attending: Urology | Admitting: Urology

## 2012-08-15 ENCOUNTER — Encounter (HOSPITAL_BASED_OUTPATIENT_CLINIC_OR_DEPARTMENT_OTHER): Payer: Self-pay | Admitting: *Deleted

## 2012-08-15 ENCOUNTER — Encounter (HOSPITAL_BASED_OUTPATIENT_CLINIC_OR_DEPARTMENT_OTHER)
Admission: RE | Disposition: A | Discharge status: Home / Self Care | Payer: Self-pay | Source: Ambulatory Visit | Attending: Urology

## 2012-08-15 ENCOUNTER — Encounter (HOSPITAL_BASED_OUTPATIENT_CLINIC_OR_DEPARTMENT_OTHER): Payer: Self-pay | Admitting: Anesthesiology

## 2012-08-15 ENCOUNTER — Other Ambulatory Visit: Payer: Self-pay

## 2012-08-15 DIAGNOSIS — K219 Gastro-esophageal reflux disease without esophagitis: Secondary | ICD-10-CM | POA: Insufficient documentation

## 2012-08-15 DIAGNOSIS — Z79899 Other long term (current) drug therapy: Secondary | ICD-10-CM | POA: Insufficient documentation

## 2012-08-15 DIAGNOSIS — N3289 Other specified disorders of bladder: Secondary | ICD-10-CM | POA: Insufficient documentation

## 2012-08-15 DIAGNOSIS — E7800 Pure hypercholesterolemia, unspecified: Secondary | ICD-10-CM | POA: Insufficient documentation

## 2012-08-15 DIAGNOSIS — IMO0002 Reserved for concepts with insufficient information to code with codable children: Secondary | ICD-10-CM

## 2012-08-15 DIAGNOSIS — K449 Diaphragmatic hernia without obstruction or gangrene: Secondary | ICD-10-CM | POA: Insufficient documentation

## 2012-08-15 DIAGNOSIS — N993 Prolapse of vaginal vault after hysterectomy: Secondary | ICD-10-CM | POA: Insufficient documentation

## 2012-08-15 DIAGNOSIS — N362 Urethral caruncle: Secondary | ICD-10-CM | POA: Insufficient documentation

## 2012-08-15 DIAGNOSIS — N952 Postmenopausal atrophic vaginitis: Secondary | ICD-10-CM | POA: Insufficient documentation

## 2012-08-15 HISTORY — DX: Cystocele, unspecified: N81.10

## 2012-08-15 HISTORY — DX: Personal history of other diseases of the digestive system: Z87.19

## 2012-08-15 HISTORY — DX: Personal history of cervical dysplasia: Z87.410

## 2012-08-15 HISTORY — PX: VAGINAL PROLAPSE REPAIR: SHX830

## 2012-08-15 HISTORY — DX: Unspecified osteoarthritis, unspecified site: M19.90

## 2012-08-15 SURGERY — SUSPENSION, VAGINAL VAULT
Anesthesia: General | Site: Vagina | Wound class: Clean Contaminated

## 2012-08-15 MED ORDER — BUPIVACAINE-EPINEPHRINE 0.5% -1:200000 IJ SOLN
INTRAMUSCULAR | Status: DC | PRN
  Administered 2012-08-15: 60 mL

## 2012-08-15 MED ORDER — DIPHENHYDRAMINE HCL 50 MG/ML IJ SOLN
12.5000 mg | Freq: Four times a day (QID) | INTRAMUSCULAR | Status: DC | PRN
  Filled 2012-08-15: qty 0.25

## 2012-08-15 MED ORDER — OXYCODONE HCL 5 MG/5ML PO SOLN
5.0000 mg | Freq: Once | ORAL | Status: AC | PRN
  Filled 2012-08-15: qty 5

## 2012-08-15 MED ORDER — HYDROMORPHONE HCL PF 1 MG/ML IJ SOLN
0.2500 mg | INTRAMUSCULAR | Status: DC | PRN
  Filled 2012-08-15: qty 1

## 2012-08-15 MED ORDER — PANTOPRAZOLE SODIUM 40 MG PO TBEC
40.0000 mg | DELAYED_RELEASE_TABLET | Freq: Every day | ORAL | Status: DC
  Filled 2012-08-15: qty 1

## 2012-08-15 MED ORDER — PROMETHAZINE HCL 25 MG/ML IJ SOLN
6.2500 mg | INTRAMUSCULAR | Status: DC | PRN
  Filled 2012-08-15: qty 1

## 2012-08-15 MED ORDER — MIDAZOLAM HCL 5 MG/5ML IJ SOLN
INTRAMUSCULAR | Status: DC | PRN
  Administered 2012-08-15: 1 mg via INTRAVENOUS

## 2012-08-15 MED ORDER — ONDANSETRON HCL 4 MG/2ML IJ SOLN
4.0000 mg | INTRAMUSCULAR | Status: DC | PRN
  Filled 2012-08-15: qty 2

## 2012-08-15 MED ORDER — ESTRADIOL 0.1 MG/GM VA CREA
TOPICAL_CREAM | VAGINAL | Status: DC | PRN
  Administered 2012-08-15: 1 via VAGINAL

## 2012-08-15 MED ORDER — BISACODYL 5 MG PO TBEC
5.0000 mg | DELAYED_RELEASE_TABLET | Freq: Every day | ORAL | Status: DC | PRN
  Filled 2012-08-15: qty 1

## 2012-08-15 MED ORDER — ACETAMINOPHEN 10 MG/ML IV SOLN
1000.0000 mg | Freq: Four times a day (QID) | INTRAVENOUS | Status: AC
  Administered 2012-08-15 – 2012-08-16 (×4): 1000 mg via INTRAVENOUS
  Filled 2012-08-15: qty 100

## 2012-08-15 MED ORDER — ACETAMINOPHEN 10 MG/ML IV SOLN
1000.0000 mg | Freq: Once | INTRAVENOUS | Status: AC | PRN
  Filled 2012-08-15: qty 100

## 2012-08-15 MED ORDER — MEPERIDINE HCL 25 MG/ML IJ SOLN
6.2500 mg | INTRAMUSCULAR | Status: DC | PRN
  Filled 2012-08-15: qty 1

## 2012-08-15 MED ORDER — STERILE WATER FOR IRRIGATION IR SOLN
Status: DC | PRN
  Administered 2012-08-15: 1000 mL
  Administered 2012-08-15: 3000 mL

## 2012-08-15 MED ORDER — CHLORHEXIDINE GLUCONATE 4 % EX LIQD
Freq: Once | CUTANEOUS | Status: DC
  Filled 2012-08-15: qty 15

## 2012-08-15 MED ORDER — LIDOCAINE HCL (CARDIAC) 20 MG/ML IV SOLN
INTRAVENOUS | Status: DC | PRN
  Administered 2012-08-15: 80 mg via INTRAVENOUS

## 2012-08-15 MED ORDER — SODIUM CHLORIDE 0.9 % IR SOLN
Status: DC | PRN
  Administered 2012-08-15: 11:00:00

## 2012-08-15 MED ORDER — HYDROCODONE-ACETAMINOPHEN 5-325 MG PO TABS
1.0000 | ORAL_TABLET | ORAL | Status: DC | PRN
  Filled 2012-08-15: qty 2

## 2012-08-15 MED ORDER — DIPHENHYDRAMINE HCL 12.5 MG/5ML PO ELIX
12.5000 mg | ORAL_SOLUTION | Freq: Four times a day (QID) | ORAL | Status: DC | PRN
  Filled 2012-08-15: qty 5

## 2012-08-15 MED ORDER — SODIUM CHLORIDE 0.45 % IV SOLN
INTRAVENOUS | Status: DC
  Administered 2012-08-15 – 2012-08-16 (×2): via INTRAVENOUS
  Filled 2012-08-15: qty 1000

## 2012-08-15 MED ORDER — SULFAMETHOXAZOLE-TMP DS 800-160 MG PO TABS
1.0000 | ORAL_TABLET | Freq: Two times a day (BID) | ORAL | Status: DC
  Administered 2012-08-15 (×2): 1 via ORAL
  Filled 2012-08-15: qty 1

## 2012-08-15 MED ORDER — PROPOFOL 10 MG/ML IV BOLUS
INTRAVENOUS | Status: DC | PRN
  Administered 2012-08-15: 40 mg via INTRAVENOUS
  Administered 2012-08-15: 160 mg via INTRAVENOUS

## 2012-08-15 MED ORDER — OXYCODONE HCL 5 MG PO TABS
5.0000 mg | ORAL_TABLET | Freq: Once | ORAL | Status: AC | PRN
  Filled 2012-08-15: qty 1

## 2012-08-15 MED ORDER — ZOLPIDEM TARTRATE 5 MG PO TABS
5.0000 mg | ORAL_TABLET | Freq: Every evening | ORAL | Status: DC | PRN
  Filled 2012-08-15: qty 1

## 2012-08-15 MED ORDER — NEOSTIGMINE METHYLSULFATE 1 MG/ML IJ SOLN
INTRAMUSCULAR | Status: DC | PRN
  Administered 2012-08-15: 2.5 mg via INTRAVENOUS

## 2012-08-15 MED ORDER — DEXAMETHASONE SODIUM PHOSPHATE 4 MG/ML IJ SOLN
INTRAMUSCULAR | Status: DC | PRN
  Administered 2012-08-15: 10 mg via INTRAVENOUS

## 2012-08-15 MED ORDER — SODIUM CHLORIDE 0.9 % IJ SOLN
INTRAMUSCULAR | Status: DC | PRN
  Administered 2012-08-15: 50 mL via INTRAVENOUS

## 2012-08-15 MED ORDER — INDIGOTINDISULFONATE SODIUM 8 MG/ML IJ SOLN
INTRAMUSCULAR | Status: DC | PRN
  Administered 2012-08-15: 5 mL via INTRAVENOUS

## 2012-08-15 MED ORDER — ONDANSETRON HCL 4 MG/2ML IJ SOLN
INTRAMUSCULAR | Status: DC | PRN
  Administered 2012-08-15: 4 mg via INTRAVENOUS

## 2012-08-15 MED ORDER — POLYETHYLENE GLYCOL 3350 17 G PO PACK
17.0000 g | PACK | Freq: Every day | ORAL | Status: DC
  Administered 2012-08-15: 17 g via ORAL
  Filled 2012-08-15: qty 1

## 2012-08-15 MED ORDER — FENTANYL CITRATE 0.05 MG/ML IJ SOLN
INTRAMUSCULAR | Status: DC | PRN
  Administered 2012-08-15 (×2): 100 ug via INTRAVENOUS

## 2012-08-15 MED ORDER — GLYCOPYRROLATE 0.2 MG/ML IJ SOLN
INTRAMUSCULAR | Status: DC | PRN
  Administered 2012-08-15: .4 mg via INTRAVENOUS

## 2012-08-15 MED ORDER — ROCURONIUM BROMIDE 100 MG/10ML IV SOLN
INTRAVENOUS | Status: DC | PRN
  Administered 2012-08-15: 40 mg via INTRAVENOUS

## 2012-08-15 MED ORDER — CEFAZOLIN SODIUM-DEXTROSE 2-3 GM-% IV SOLR
2.0000 g | INTRAVENOUS | Status: AC
  Administered 2012-08-15: 2 g via INTRAVENOUS
  Filled 2012-08-15: qty 50

## 2012-08-15 MED ORDER — LACTATED RINGERS IV SOLN
INTRAVENOUS | Status: DC
  Administered 2012-08-15 (×2): via INTRAVENOUS
  Filled 2012-08-15: qty 1000

## 2012-08-15 MED ORDER — BELLADONNA ALKALOIDS-OPIUM 16.2-60 MG RE SUPP
RECTAL | Status: DC | PRN
  Administered 2012-08-15: 1 via RECTAL

## 2012-08-15 SURGICAL SUPPLY — 59 items
ADH SKN CLS APL DERMABOND .7 (GAUZE/BANDAGES/DRESSINGS)
BAG URINE DRAINAGE (UROLOGICAL SUPPLIES) ×1 IMPLANT
BLADE SURG 10 STRL SS (BLADE) ×2 IMPLANT
BLADE SURG 15 STRL LF DISP TIS (BLADE) ×1 IMPLANT
BLADE SURG 15 STRL SS (BLADE) ×2
BLADE SURG ROTATE 9660 (MISCELLANEOUS) ×2 IMPLANT
BOOTIES KNEE HIGH SLOAN (MISCELLANEOUS) ×1 IMPLANT
CANISTER SUCTION 1200CC (MISCELLANEOUS) IMPLANT
CANISTER SUCTION 2500CC (MISCELLANEOUS) ×3 IMPLANT
CATH FOLEY 2WAY SLVR  5CC 16FR (CATHETERS) ×1
CATH FOLEY 2WAY SLVR 5CC 16FR (CATHETERS) ×1 IMPLANT
CLOTH BEACON ORANGE TIMEOUT ST (SAFETY) ×2 IMPLANT
COVER MAYO STAND STRL (DRAPES) ×2 IMPLANT
COVER TABLE BACK 60X90 (DRAPES) ×2 IMPLANT
DERMABOND ADVANCED (GAUZE/BANDAGES/DRESSINGS)
DERMABOND ADVANCED .7 DNX12 (GAUZE/BANDAGES/DRESSINGS) IMPLANT
DISSECTOR ROUND CHERRY 3/8 STR (MISCELLANEOUS) ×1 IMPLANT
DRAPE CAMERA CLOSED 9X96 (DRAPES) ×2 IMPLANT
DRAPE UNDERBUTTOCKS STRL (DRAPE) ×3 IMPLANT
FLOSEAL 10ML (HEMOSTASIS) IMPLANT
GAUZE SPONGE 4X4 16PLY XRAY LF (GAUZE/BANDAGES/DRESSINGS) IMPLANT
GLOVE BIO SURGEON STRL SZ7.5 (GLOVE) ×2 IMPLANT
GLOVE BIOGEL M 6.5 STRL (GLOVE) ×2 IMPLANT
GLOVE ECLIPSE 6.0 STRL STRAW (GLOVE) ×2 IMPLANT
GLOVE INDICATOR 6.5 STRL GRN (GLOVE) ×4 IMPLANT
GOWN PREVENTION PLUS LG XLONG (DISPOSABLE) ×4 IMPLANT
GOWN STRL REIN XL XLG (GOWN DISPOSABLE) ×2 IMPLANT
NDL 1/2 CIR CATGUT .05X1.09 (NEEDLE) IMPLANT
NEEDLE 1/2 CIR CATGUT .05X1.09 (NEEDLE) IMPLANT
NEEDLE HYPO 22GX1.5 SAFETY (NEEDLE) ×4 IMPLANT
PACKING VAGINAL (PACKING) ×2 IMPLANT
PENCIL BUTTON HOLSTER BLD 10FT (ELECTRODE) ×2 IMPLANT
PLUG CATH AND CAP STER (CATHETERS) ×2 IMPLANT
RETRACTOR LONRSTAR 16.6X16.6CM (MISCELLANEOUS) ×1 IMPLANT
RETRACTOR STAY HOOK 5MM (MISCELLANEOUS) ×2 IMPLANT
RETRACTOR STER APS 16.6X16.6CM (MISCELLANEOUS) ×2
SET IRRIG Y TYPE TUR BLADDER L (SET/KITS/TRAYS/PACK) ×2 IMPLANT
SHEET LAVH (DRAPES) ×2 IMPLANT
SLING SOLYX SYSTEM SIS BX (SLING) IMPLANT
SPONGE LAP 4X18 X RAY DECT (DISPOSABLE) ×3 IMPLANT
SUCTION FRAZIER TIP 10 FR DISP (SUCTIONS) ×2 IMPLANT
SUT ABS MONO DBL WITH NDL 48IN (SUTURE) ×2 IMPLANT
SUT ETHILON 2 0 PS N (SUTURE) IMPLANT
SUT MON AB 2-0 SH 27 (SUTURE)
SUT MON AB 2-0 SH27 (SUTURE) IMPLANT
SUT NONABSORB MONO DB W/NDL 48 (SUTURE) IMPLANT
SUT SILK 3 0 PS 1 (SUTURE) ×1 IMPLANT
SUT VIC AB 0 CT1 36 (SUTURE) IMPLANT
SUT VIC AB 2-0 CT1 27 (SUTURE) ×6
SUT VIC AB 2-0 CT1 TAPERPNT 27 (SUTURE) ×4 IMPLANT
SUT VIC AB 2-0 UR6 27 (SUTURE) ×10 IMPLANT
SYR BULB IRRIGATION 50ML (SYRINGE) ×2 IMPLANT
SYR CONTROL 10ML LL (SYRINGE) ×4 IMPLANT
SYRINGE 10CC LL (SYRINGE) ×2 IMPLANT
TISSUE REPAIR XENFORM 6X10CM (Tissue) ×1 IMPLANT
TRAY DSU PREP LF (CUSTOM PROCEDURE TRAY) ×2 IMPLANT
TUBE CONNECTING 12X1/4 (SUCTIONS) ×4 IMPLANT
WATER STERILE IRR 500ML POUR (IV SOLUTION) ×4 IMPLANT
YANKAUER SUCT BULB TIP NO VENT (SUCTIONS) ×1 IMPLANT

## 2012-08-15 NOTE — Transfer of Care (Signed)
Immediate Anesthesia Transfer of Care Note  Patient: [name]  Procedure(s) Performed: Procedure(s) with comments: VAGINAL VAULT SUSPENSION (N/A) - ANTERIOR VAULT REPAIR WITH XENFORE  Patient Location: PACU  Anesthesia Type:General  Level of Consciousness: alert  and sedated  Airway & Oxygen Therapy: Patient Spontanous Breathing and Patient connected to face mask oxygen  Post-op Assessment: Report given to PACU RN  Post vital signs: Reviewed and stable  Complications: No apparent anesthesia complications

## 2012-08-15 NOTE — Brief Op Note (Signed)
08/15/2012  12:20 PM  PATIENT:  [name]  67 y.o. female  PRE-OPERATIVE DIAGNOSIS:  CYSTOCELE  POST-OPERATIVE DIAGNOSIS:  Cystocele  PROCEDURE:  Procedure(s) with comments: VAGINAL VAULT SUSPENSION (N/A) - ANTERIOR VAULT REPAIR WITH XENFORE  SURGEON:  Surgeon(s) and Role:    * Kathi Ludwig, MD - Primary  PHYSICIAN ASSISTANT: none  ASSISTANTS: none   ANESTHESIA:   general  EBL:  Total I/O In: 1000 [I.V.:1000] Out: - equal  BLOOD ADMINISTERED:none  DRAINS: none   LOCAL MEDICATIONS USED:  XYLOCAINE, with EPI, !%, 1::200.000  and Amount: 80 ml  SPECIMEN:  No Specimen  DISPOSITION OF SPECIMEN:  N/A  COUNTS:  YES  TOURNIQUET:  * No tourniquets in log *  DICTATION: .Dragon Dictation  PLAN OF CARE: Admit for overnight observation  PATIENT DISPOSITION:  PACU - hemodynamically stable.   Delay start of Pharmacological VTE agent (>24hrs) due to surgical blood loss or risk of bleeding: not applicable  no

## 2012-08-15 NOTE — Anesthesia Procedure Notes (Signed)
Procedure Name: Intubation Date/Time: 08/15/2012 10:23 AM Performed by: Maris Berger T Pre-anesthesia Checklist: Patient identified, Emergency Drugs available, Suction available and Patient being monitored Patient Re-evaluated:Patient Re-evaluated prior to inductionOxygen Delivery Method: Circle System Utilized Preoxygenation: Pre-oxygenation with 100% oxygen Intubation Type: IV induction Ventilation: Mask ventilation without difficulty Laryngoscope Size: Mac and 3 Grade View: Grade II Tube type: Oral Tube size: 7.0 mm Number of attempts: 1 Airway Equipment and Method: stylet,  oral airway and Stylet Placement Confirmation: ETT inserted through vocal cords under direct vision,  positive ETCO2 and breath sounds checked- equal and bilateral Secured at: 21 cm Tube secured with: Tape Dental Injury: Teeth and Oropharynx as per pre-operative assessment

## 2012-08-15 NOTE — Anesthesia Postprocedure Evaluation (Signed)
Anesthesia Post Note  Patient: [name]  Procedure(s) Performed: Procedure(s) (LRB): VAGINAL VAULT SUSPENSION (N/A)  Anesthesia type: General  Patient location: PACU  Post pain: Pain level controlled  Post assessment: Patient's Cardiovascular Status Stable  Last Vitals:  Filed Vitals:   08/15/12 1330  BP: 143/84  Pulse: 73  Temp:   Resp: 13    Post vital signs: Reviewed and stable  Level of consciousness: alert  Complications: No apparent anesthesia complications

## 2012-08-15 NOTE — Progress Notes (Signed)
Assessment completed. Patient alert able to verbalize needs No acute distress noted. Taking p.o fluids well. Call light in reach.

## 2012-08-15 NOTE — Interval H&P Note (Signed)
History and Physical Interval Note:  08/15/2012 10:04 AM  [name]  has presented today for surgery, with the diagnosis of CYSTOCELE  The various methods of treatment have been discussed with the patient and family. After consideration of risks, benefits and other options for treatment, the patient has consented to  Procedure(s) with comments: VAGINAL VAULT SUSPENSION (N/A) - ANTERIOR VAULT REPAIR WITH XENFORE as a surgical intervention .  The patient's history has been reviewed, patient examined, no change in status, stable for surgery.  I have reviewed the patient's chart and labs.  Questions were answered to the patient's satisfaction.     [name] I

## 2012-08-15 NOTE — Anesthesia Preprocedure Evaluation (Addendum)
Anesthesia Evaluation  Patient identified by MRN, date of birth, ID band Patient awake    Reviewed: Allergy & Precautions, H&P , NPO status , Patient's Chart, lab work & pertinent test results  Airway Mallampati: II TM Distance: >3 FB Neck ROM: Full    Dental  (+) Dental Advisory Given and Teeth Intact   Pulmonary neg pulmonary ROS,  breath sounds clear to auscultation        Cardiovascular negative cardio ROS  Rhythm:Regular Rate:Normal     Neuro/Psych negative psych ROS   GI/Hepatic Neg liver ROS, hiatal hernia, GERD-  Medicated,  Endo/Other  negative endocrine ROS  Renal/GU negative Renal ROS     Musculoskeletal negative musculoskeletal ROS (+)   Abdominal   Peds  Hematology negative hematology ROS (+)   Anesthesia Other Findings   Reproductive/Obstetrics                          Anesthesia Physical Anesthesia Plan  ASA: II  Anesthesia Plan: General   Post-op Pain Management:    Induction: Intravenous  Airway Management Planned: LMA  Additional Equipment:   Intra-op Plan:   Post-operative Plan: Extubation in OR  Informed Consent: I have reviewed the patients History and Physical, chart, labs and discussed the procedure including the risks, benefits and alternatives for the proposed anesthesia with the patient or authorized representative who has indicated his/her understanding and acceptance.   Dental advisory given  Plan Discussed with: CRNA  Anesthesia Plan Comments: (Pt complaining of substernal chest pain. Pt thinks it is gas pain as it is relieved by belching. No radiation, nausea, diaphoresis, or exacerbating factors. Pt had thorough cardiac workup 9 months ago and was low risk. Had long discussion with the patient about increased risk if this is a cardiac event. She accepts and plan to proceed. )       Anesthesia Quick Evaluation

## 2012-08-15 NOTE — Op Note (Signed)
Pre-operative diagnosis : Stage III anterior vaginal vault prolapse  Postoperative diagnosis:   Same  Operation:   Anterior vaginal vault repair using Kelly plication and Xeroform sacrospinous ligament fixation repair  Surgeon:  S. Patsi Sears, MD  First assistant:  None  Anesthesia:  general  Preparation:  After appropriate preanesthesia, the patient was brought to the operating room, placed on the operating table in the dorsal supine position where general LMA anesthesia was introduced. She was then replaced in the dorsal lithotomy position where the pubis was prepped with Betadine solution and draped in usual fashion. Armband was double checked.  Review history:   : Referring physician:   Dr  Oliver Barre                                     Dr. Bjorn Pippin Reason for referral:   Symptomatic Anterior Vaginal Vault Prolapse, Stage III  Chief Complaint:  "vaginal bulge through opening" History of Present Illness:    . Cystocele 596.89  2. 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 Urethral Caruncle 599.3  67 yo female with Stage III POP anterior vault prolapse, progressive over thge last 2 years, now protruding through the entroitus. She is 23 yrs post hysterectomy, and denies clinical stress incontinence. Negative Marshall or Q-tip test. She has been evaluated with video-urodynamcis, showing no stress component. We have discussed the diagnosis of her problem, the genetics, her hysterectomy, and multiparity. In adddition, we have discussed alternatives for repair of her prolapse, and she has been interviewed by Rema Fendt, research nurse, for the [facility] Bills research protocol. We have discussed the following:  She has been found to have a hernia-or weakness- in the vaginal wall, allowing one or more organs to begin to descend-both by the pull of gravity, and by the push of abdominal straining-into, and occasionally through-the vagina. This anatomic herniation is called Pelvic Organ  Prolapse (POP).  She has been found to have: Weakness of the anterior vaginal wall, resulting in a Cystocele; but not stress urinary incontinence.  This anatomic problem may be directly related to childbirth, neuropathies, diabetes, constipation, cigarette smoking, steroid use, diseases of the connective tissue (e.g.: lupus, steroids for chronic asthma), or previous pelvic surgery, radiation, or chemotherapy.  The surgical repair which is recommended is based on an integration of symptoms, medical history, physical examination; as well as video urodynamic studies, and laboratory evaluation.  In the end, the final anatomic evaluation is performed under anesthesia, and it is for this reason, that the end result operation may be slightly different than the preoperative evaluation might suggest. We are prepared to repair the pelvic floor in any number of ways, which have been explained to you.  After consideration, the patient and her family are too concerned with the use of mesh to allow usage in her vaginal repair ( concerned with complication rate of 2-3% vs chance of recurrence of 2%), although it would give her the best chance for repair without recurrence. Instead, she would like natural tissue repair, and will allow biologic tissue augmentation, resulting in an excellent anatomic repair, with reasonable recurrence rate ( 20%-5 yr).    Statement of  Likelihood of Success: Excellent. TIME-OUT observed.:  Procedure:  80 cc of Xylocaine with epinephrine, 2%/1 200,000 was injected in the vaginal submucosal tissue, to afford both hemostasis, as well as tissue dissection. Using a marking pen, area of incision was marked. The bladder  neck was also marked. Foley catheter was placed, bladder was drained of fluid.  Horizontal incision was made, measuring 5 cm, and subcutaneous tissue is dissected with sharp and blunt dissection. A large anterior vault cystocele is identified, and this is dissected lateralward,  and proximalward to the apex. Dissection is carried into the pelvis bilaterally. The sacrospinous ligaments are identified, and the ischial spines are also identified bilaterally. No bleeding is noted. A Kelly plication is then accomplished, using 2-0 Vicryl horizontal mattress sutures.  Using the [facility] Lite needle driver, a 0 poly-dioxanone   monofilament absorbable suture is placed bilaterally through the sacrospinous ligament , > 1 fingerbreadth medial to the ischial spine bilaterally. The sutures are  then passed through a piece of  measured Xenform, measuring 7 cm in length, and  6 cm in width. The 6 cm portion was then sutured at the apex bilaterally, with excellent apical support noted. The distal portion of the tissue was then sutured lateralward with the 6 needle, and using the U. needle as well, and provided a nice secure patch, without being too tight. No bleeding was noted. Antibiotic irrigation was then used, and the wound was closed without removing any vaginal tissue. The wound was closed using 2 separate 2-0 Vicryl sutures, running from the 12:00 to the 3:00 position, and from the 12:00 to 9:00 position.  Cystoscopy was accomplished after indigocarmine was given, and jets of blue dye were seen from both ureteral orifices.  Foley catheter was placed, and the vagina was packed with Estrace cream vaginal packing. The patient is noted to have a small carbuncle. She was awakened, and taken to recovery room in good condition. She received a B. and O. suppository at the beginning of the procedure, and IV Tylenol at the beginning of the procedure. She did not receive Toradol, because of her allergy to anti-inflammatories.

## 2012-08-15 NOTE — H&P (Signed)
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 Annabell Howells for consultation/evaluation of a cystocele.  She was told she had a cystocele 2 yrs ago, though asymptomatic.    She was prescribed Estrace cream for about 2 weeks, but then stopped because of a rust color discharge (? vaginal bleeding).  She was originally referred by her family doctor Illene Regulus, MD for a cystocele.  She has no significant GU history.  She had a hysterectomy in 1991 for fibroids. Mother had prolapse uterus and had surgery. She has had hysterectomy also 23 yrs ago. No cough , laugh, or sneeze incontinence. no tobacco. no constipation. Not currrently symptomatic.  In September 2013 during her bowel prep for her colonoscopy, Mrs. Azpeitia noticed a buldge in her vaginal area that protrude through the introitus.  She says that it went away so she proceeded with her colonoscopy.  Two days later, the buldge reappeared.  It is not associated with any urinary symptoms.  She feels empty when she urinates and does not have to splint her bladder in order to empty.  She denies urge incontinence, stress incontinence, dysuria, or hematuria.  She has nocturia x 0-1.    Her physical exam demonstrated anterior vault prolapse, grade III. She did not complain of concomitant stress urinary incontinence. . She has had videourodynamics to asses her pelvic floor and vaginal vault, and her cough leak point pressure.    Video urodynamics accomplished on 1/14/ 2014 shows a first sensation of 176 cc, with a normal desire at 185 cc and strong desire at 226 cc. There is a small unstable bladder contraction, but with a contraction pressure of only 7 cm water, and no urinary leakage. The patient is able to inhibit this unstable contraction. Of note is that she did have a post void contraction of 43 cm of water ( at the end of the procedure).  Valsalva leak point pressure was evaluated, and she did not leak for Valsalva leak point pressure determination, with  maximum abdominal pressure generation of 83-80 cm water. There was no leakage with or without reduction of her prolapse. Both her cough and her Valsalva were weak.  Pressure flow study showed a voided volume of 267 cc, and maximum flow of 18 cc per second. The detrusor pressure at maximum flow was 21 cm water. Post void residual is scant.  She was felt to have a small, hypersensitive detrusor, with no stress incontinence demonstrated with or without reduction of her prolapse. But notes that her cough and Valsalva were weak. She does generate a voluntary contraction, and is able to void to completion. She voids with the prolapse reduced, and notes that her flow is only slightly better than usual. She was also noted to have a small left-sided diverticulum on VCUG, which drains easily and is not thought to be significant.   Past Medical History Problems  1. 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 History of  Esophageal Reflux 530.81 3. History of  Hiatal Hernia 553.3 4. History of  Hypercholesterolemia 272.0  Surgical History Problems  1. History of  Cholecystectomy 2. History of  Hysterectomy V45.77 3. History of  Nasal Septal Deviation Repair 4. History of  Tubal Ligation V25.2  Current Meds 1. Antacid 200-200-20 MG/5ML Oral Suspension; Therapy: (Recorded:28Oct2013) to 2. Multi-Vitamin TABS; Therapy: (Recorded:28Oct2013) to 3. NexIUM 20 MG Oral Capsule Delayed Release; Therapy: (Recorded:28Oct2013) to 4. Simvastatin TABS; Therapy: (Recorded:28Oct2013) to 5. Tylenol TABS; Therapy: (Recorded:28Oct2013) to 6.  Vitamin D3 CAPS; Therapy: (Recorded:28Oct2013) to  Allergies Medication  1. Hydrocodone-Acetaminophen CAPS 2. Lipitor TABS 3. NSAIDs 4. Pravachol TABS  Family History Problems  1. Sororal history of  Breast Cancer V16.3 2. Maternal history of  Coronary Artery Disease V17.49 3. Family history of  Death In The Family Father 4. Family history of  Death In  The Family Mother 5. Family history of  Family Health Status Number Of Children 2 sons 1 daughter 29. Family history of  Hypertension V17.49 7. Family history of  Nephrolithiasis 8. Fraternal history of  Prostate Cancer V16.42 9. Paternal history of  Stroke Syndrome V17.1  Social History Problems  1. Marital History - Currently Married 2. Never A Smoker 3. 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 History of  Alcohol Use 5. History of  Caffeine Use  Review of Systems  Genitourinary: nocturia and vaginal lump or mass.    Vitals Vital Signs [Data Includes: Last 1 Day]  22Jan2014 01:00PM  Blood Pressure: 135 / 85 Temperature: 98.7 F Heart Rate: 85  Physical Exam Abdomen: The abdomen is soft and nontender. No masses are palpated. No CVA tenderness. No hernias are palpable. No hepatosplenomegaly noted.  Genitourinary:  Chaperone Present: Vernona Rieger.  Examination of the external genitalia shows vulvar atrophy. The urethra is urethral caruncle, but not tender and does not appear stenotic. There is a 1cm mm urethral mass. Urethral hypermobility is not present. There is no urethral discharge. No periurethral cyst is identified. There is no urethral prolapse. Vaginal exam demonstrates atrophy and the vaginal epithelium to be poorly estrogenized, but no discharge and no tenderness. A cystocele is present with a midline defect (grade 3 /4). No enterocele is identified. No rectocele is identified. The cervix is is absent. There is no cervical discharge. The uterus is absent, but non tender and not enlarged. The adnexa are palpably normal. The bladder is normal on palpation. The anus is normal on inspection.    Results/Data  Urine [Data Includes: Last 1 Day]   22Jan2014  COLOR YELLOW   APPEARANCE CLEAR   SPECIFIC GRAVITY <1.005   pH 6.0   GLUCOSE NEG mg/dL  BILIRUBIN NEG   KETONE NEG mg/dL  BLOOD SMALL   PROTEIN NEG mg/dL  UROBILINOGEN 0.2 mg/dL  NITRITE NEG   LEUKOCYTE ESTERASE NEG    SQUAMOUS EPITHELIAL/HPF RARE   WBC NONE SEEN WBC/hpf  RBC 0-2 RBC/hpf  BACTERIA NONE SEEN   CRYSTALS NONE SEEN   CASTS NONE SEEN    20 Jul 2012 12:41 PM   UA With REFLEX       COLOR YELLOW       APPEARANCE CLEAR       SPECIFIC GRAVITY <1.005       pH 6.0       GLUCOSE NEG       BILIRUBIN NEG       KETONE NEG       BLOOD SMALL       PROTEIN NEG       UROBILINOGEN 0.2       NITRITE NEG       LEUKOCYTE ESTERASE NEG       SQUAMOUS EPITHELIAL/HPF RARE       WBC NONE SEEN       CRYSTALS NONE SEEN       CASTS NONE SEEN       RBC 0-2       BACTERIA NONE SEEN    Assessment Assessed  1. Cystocele 596.89 2. 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 She has been evaluated with urodynamcis, showing no stress component. We have discussed the diagnosis of her problem, the genetics, her hysterectomy, and multiparity. In adddition, we have discussed alternatives for repair of her prolapse, and she has been interviewed by Rema Fendt, research nurse, for the [employer] Bills research protocol. We have discussed the following:      She  has been found to have a hernia-or weakness- in the vaginal wall, allowing one or more organs to begin to descend-both by the pull of gravity, and by the push of abdominal straining-into, and occasionally through-the vagina. This anatomic herniation is called Pelvic Organ Prolapse (POP).    She has been  found to have:  Weakness of the anterior vaginal wall, resulting in a Cystocele; but not stress urinary incontinence.       This  anatomic problem may be directly related to childbirth, neuropathies, diabetes, constipation, cigarette smoking, steroid use, diseases of the connective tissue (e.g.: lupus, steroids for chronic asthma), or previous pelvic surgery, radiation, or chemotherapy. The surgical repair which is recommended is based on an integration of symptoms, medical history,   physical examination; as well as  video urodynamic studies, and laboratory evaluation.    In the end, the final anatomic evaluation is performed under anesthesia, and it is for this reason, that the end result operation may be slightly different than the preoperative evaluation might suggest. We are prepared to repair the pelvic floor in any number of ways, which have been explained to you.  Pelvic floor repair was first reported in 1913, with success rates of 50-90%. However, long-term followup of repairs using absorbable tissue has shown 20% to 50% recurrence rates. Therefore, when synthetic mesh was introduced into pelvic floor repairs in 2000, it was quickly incorporated into surgical approaches. However, early evaluation of surgical mesh (no longer in use) (2000 -2005) showed technical flaws which led to erosion of the mesh through the vaginal incision. This has led to multiple lawsuits with regard to the use of vaginal mesh. Currently, mesh erosion is noted to be a complication of pelvic organ prolapse surgery, as reported in 2-11% of cases.  Evaluation of my experience with the newest vaginal mesh [facility] Scientific mesh, Patsi Sears, January 2009 through August 2011) has shown no erosions from pubovaginal sling surgery, and a 2% extrusion rate from other larger pelvic floor organ repair surgery. These extrusions have been treated by vaginal hormone treatment, or surgical excision of a tiny portion of the extruded mesh. There have been no severe long-term complications noted (up to 3 year follow-up).    There are several approaches for repair for pelvic organ prolapse.   Anterior vault suspension is accomplished by placing mesh between the bladder and vaginal wall, and attaching the mesh to the sacrospinous ligaments. This prevents the anterior vault hernia from recurring.    If  hysterectomy is to be accomplished at the same time, then absorbable tissue is recommended to be used instead of mesh,  because of an increased chance of mesh erosion at the incision line. Anterior vault suspension can also be accomplished via the laparoscopic robotic approach. While this approach gives excellent results, it is more expensive, and more invasive. Transvaginal surgery allows the patient to have natural orifice surgical approach ("Notes" approach), which is less invasive and less expensive, while providing superb results. Recent evaluation (see above) has shown a single surgeon's results  with 100 cases of anterior and posterior vault suspension using Public affairs consultant. These results show a 2% recurrence rate, 2% extrusion rate, on patients with grade 4 prolapse (worst prolapse).  Common questions for patient's regarding pelvic organ prolapse have been discussed:  #1  Is mesh to be used in my surgery? #2  Am I a good candidate for mesh?  #3 Why is mesh the best option for my repair? #4 What are the alternatives to transvaginal mesh for repair of my prolapse?  #5 Could my surgery be performed without using mesh? #6 Will my partner be able to feel the surgical mesh during sexual intercourse, and what if the mesh erodes through the vaginal wall? #7 How often have you, as a Careers adviser, implanted this particular product?  #8 What results have your other patients had with this product?  #9 What can I expect to feel after surgery, and for how long? #10 Which specific side effects should I report after surgery? #11 What if the mesh surgery doesn't correct the problem?  I believe surgical mesh implantation will correct your pelvic organ prolapse problem, with a very low chance of complications. As per the FDA communication of July 2011, is important to understand that Dr.Chasten Blaze has had 32 years of surgical experience in incontinence and pelvic organ repair, with special training in the implantation of pelvic organ prolapse mesh. I am an Secondary school teacher in the [employer] pelvic organ prolapse  surgical teaching laboratories.  In consideration of all types of pelvic organ repairs, please know that we have considered" no repair"; repair via vaginal approach; repair via abdominal approach-using native tissue, absorbable tissue, and nonabsorbable mesh.  Imelda Pillow, MD    Plan      1. Note ALLERGY to ANSAIDS  2. Schedule for BS Uph[employer] Lite repair 3. Begin consent for research study.  UA With REFLEX  Status: Resulted - Requires Verification  Done: 01Jan0001 12:00AM Ordered Today; For: Health Maintenance (V70.0); Ordered By: [name]  Due: 24Jan2014 Marked Important; Last Updated By: Larena Sox   Signatures Electronically signed by : [name], M.D.; Jul 20 2012  3:04PM ke with pateint regarding the [employer] research protocol. She was given the informed consent to take home and discuss with her husband and family. I told her I will call her on Monday 07/25/12 hopefully to schedule a time to come into office to further discuss the study and answer all of her questions.    Electronically signed MV:HQIO  Willeen Cass   Jul 20 2012  2:15PM EST   cumentation   phone call: Pt remembers that she had GB surgery 7 yrs ago with bowel complication and sepsis. She was reading about possibility of infection of mesh and she was reminded of how serious her abdominal infection was. She definately does not want mesh repair. She wants native tissue repair, understanding that she faces a 50% failure rate. However, she is willling to have tissue augmentation of the repair.

## 2012-08-16 ENCOUNTER — Encounter (HOSPITAL_BASED_OUTPATIENT_CLINIC_OR_DEPARTMENT_OTHER): Payer: Self-pay | Admitting: Urology

## 2012-08-16 MED ORDER — POLYETHYLENE GLYCOL 3350 17 G PO PACK: PACK | ORAL | Status: DC

## 2012-08-16 MED ORDER — TRAMADOL-ACETAMINOPHEN 37.5-325 MG PO TABS: 1.0000 | ORAL_TABLET | Freq: Four times a day (QID) | ORAL | Status: DC | PRN

## 2012-08-16 MED ORDER — TRAMADOL HCL 50 MG PO TABS
50.0000 mg | ORAL_TABLET | Freq: Four times a day (QID) | ORAL | Status: DC | PRN
  Administered 2012-08-16: 50 mg via ORAL
  Filled 2012-08-16: qty 1

## 2012-08-16 MED ORDER — TRIMETHOPRIM 100 MG PO TABS: 100.0000 mg | ORAL_TABLET | ORAL | Status: DC

## 2012-08-16 NOTE — Progress Notes (Signed)
Foley catheter and vaginal packing removed. Patient tolerated well.

## 2012-08-16 NOTE — Progress Notes (Signed)
Patient alert requested to go to bathroom felt like she needed to have a B.M Ambulated to bathroom with assistance. Patient passed a lot of gas but no B.M. Will continue to monitor.

## 2012-08-16 NOTE — Progress Notes (Signed)
Urology Progress Note  1 Day Post-Op   Subjective:  Post-op sacrospinus ligament anterior vault suspension with Xenform and Kelly plication. + void this AM.      No acute urologic events overnight. Ambulation:   positive Flatus:    positive Bowel movement  negative  Pain: some relief.  Pt intolerant of codeine.   Objective:  Blood pressure 115/67, pulse 60, temperature 97.4 F (36.3 C), temperature source Oral, resp. rate 18, height 5' (1.524 m), weight 67.586 kg (149 lb), SpO2 95.00%.  Physical Exam:  General:  No acute distress, awake Resp: clear to auscultation bilaterally Extremities: extremities normal, atraumatic, no cyanosis or edema Genitourinary:   Normal BUS. Minimal blood on packing when removed.  Foley: out/packing out.    I/O last 3 completed shifts: In: 4677 [P.O.:1230; I.V.:2247; Other:1200] Out: 2500 [Urine:2500]  No results found for this basename: HGB, WBC, PLT,  in the last 72 hours  No results found for this basename: NA, K, CL, CO2, BUN, CREATININE, CALCIUM, MAGNESIUM, GFRNONAA, GFRAA,  in the last 72 hours   No results found for this basename: PT, INR, APTT,  in the last 72 hours   No components found with this basename: ABG,   Assessment/Plan: D/c today. Tramsdol for pain ( having minimal pain). Pt is to increase activities as tolerated.

## 2012-08-17 LAB — POCT HEMOGLOBIN-HEMACUE: Hemoglobin: 15.1 g/dL — ABNORMAL HIGH (ref 12.0–15.0)

## 2012-11-04 ENCOUNTER — Other Ambulatory Visit: Payer: Self-pay | Admitting: Internal Medicine

## 2012-11-18 ENCOUNTER — Emergency Department (HOSPITAL_COMMUNITY): Payer: Medicare HMO

## 2012-11-18 ENCOUNTER — Encounter (HOSPITAL_COMMUNITY): Payer: Self-pay | Admitting: *Deleted

## 2012-11-18 ENCOUNTER — Inpatient Hospital Stay (HOSPITAL_COMMUNITY)
Admission: EM | Admit: 2012-11-18 | Discharge: 2012-11-22 | DRG: 390 | Disposition: A | Discharge status: Home / Self Care | Payer: Medicare HMO | Attending: Internal Medicine | Admitting: Internal Medicine

## 2012-11-18 DIAGNOSIS — Z800 Family history of malignant neoplasm of digestive organs: Secondary | ICD-10-CM

## 2012-11-18 DIAGNOSIS — M25569 Pain in unspecified knee: Secondary | ICD-10-CM

## 2012-11-18 DIAGNOSIS — Z833 Family history of diabetes mellitus: Secondary | ICD-10-CM

## 2012-11-18 DIAGNOSIS — K56600 Partial intestinal obstruction, unspecified as to cause: Secondary | ICD-10-CM

## 2012-11-18 DIAGNOSIS — K219 Gastro-esophageal reflux disease without esophagitis: Secondary | ICD-10-CM | POA: Diagnosis present

## 2012-11-18 DIAGNOSIS — Z823 Family history of stroke: Secondary | ICD-10-CM

## 2012-11-18 DIAGNOSIS — M1710 Unilateral primary osteoarthritis, unspecified knee: Secondary | ICD-10-CM | POA: Diagnosis present

## 2012-11-18 DIAGNOSIS — K56609 Unspecified intestinal obstruction, unspecified as to partial versus complete obstruction: Principal | ICD-10-CM

## 2012-11-18 DIAGNOSIS — Z8249 Family history of ischemic heart disease and other diseases of the circulatory system: Secondary | ICD-10-CM

## 2012-11-18 DIAGNOSIS — R7309 Other abnormal glucose: Secondary | ICD-10-CM

## 2012-11-18 DIAGNOSIS — Z79899 Other long term (current) drug therapy: Secondary | ICD-10-CM

## 2012-11-18 DIAGNOSIS — E782 Mixed hyperlipidemia: Secondary | ICD-10-CM | POA: Diagnosis present

## 2012-11-18 DIAGNOSIS — Z888 Allergy status to other drugs, medicaments and biological substances status: Secondary | ICD-10-CM

## 2012-11-18 DIAGNOSIS — E785 Hyperlipidemia, unspecified: Secondary | ICD-10-CM | POA: Diagnosis present

## 2012-11-18 DIAGNOSIS — Z803 Family history of malignant neoplasm of breast: Secondary | ICD-10-CM

## 2012-11-18 DIAGNOSIS — E663 Overweight: Secondary | ICD-10-CM | POA: Diagnosis present

## 2012-11-18 DIAGNOSIS — G5600 Carpal tunnel syndrome, unspecified upper limb: Secondary | ICD-10-CM

## 2012-11-18 LAB — CBC WITH DIFFERENTIAL/PLATELET
Basophils Absolute: 0 10*3/uL (ref 0.0–0.1)
Basophils Relative: 0 % (ref 0–1)
Eosinophils Absolute: 0 10*3/uL (ref 0.0–0.7)
MCH: 30.8 pg (ref 26.0–34.0)
MCHC: 33.9 g/dL (ref 30.0–36.0)
Monocytes Absolute: 0.5 10*3/uL (ref 0.1–1.0)
Monocytes Relative: 6 % (ref 3–12)
Neutro Abs: 7.6 10*3/uL (ref 1.7–7.7)
Neutrophils Relative %: 82 % — ABNORMAL HIGH (ref 43–77)
RDW: 13.8 % (ref 11.5–15.5)

## 2012-11-18 LAB — COMPREHENSIVE METABOLIC PANEL
AST: 28 U/L (ref 0–37)
Albumin: 4.5 g/dL (ref 3.5–5.2)
BUN: 14 mg/dL (ref 6–23)
Chloride: 99 mEq/L (ref 96–112)
Creatinine, Ser: 0.75 mg/dL (ref 0.50–1.10)
Potassium: 4.2 mEq/L (ref 3.5–5.1)
Total Bilirubin: 0.8 mg/dL (ref 0.3–1.2)
Total Protein: 8.3 g/dL (ref 6.0–8.3)

## 2012-11-18 LAB — LIPASE, BLOOD: Lipase: 25 U/L (ref 11–59)

## 2012-11-18 MED ORDER — SODIUM CHLORIDE 0.9 % IV BOLUS (SEPSIS)
1000.0000 mL | Freq: Once | INTRAVENOUS | Status: AC
  Administered 2012-11-18: 1000 mL via INTRAVENOUS

## 2012-11-18 MED ORDER — IOHEXOL 300 MG/ML  SOLN
100.0000 mL | Freq: Once | INTRAMUSCULAR | Status: AC | PRN
  Administered 2012-11-18: 100 mL via INTRAVENOUS

## 2012-11-18 MED ORDER — IOHEXOL 300 MG/ML  SOLN
50.0000 mL | Freq: Once | INTRAMUSCULAR | Status: AC | PRN
  Administered 2012-11-18: 50 mL via ORAL

## 2012-11-18 MED ORDER — ONDANSETRON HCL 4 MG/2ML IJ SOLN
4.0000 mg | Freq: Once | INTRAMUSCULAR | Status: AC
  Administered 2012-11-18: 4 mg via INTRAVENOUS
  Filled 2012-11-18: qty 2

## 2012-11-18 MED ORDER — HYDROMORPHONE HCL PF 1 MG/ML IJ SOLN
0.5000 mg | Freq: Once | INTRAMUSCULAR | Status: AC
  Administered 2012-11-18: 0.5 mg via INTRAVENOUS
  Filled 2012-11-18: qty 1

## 2012-11-18 NOTE — ED Provider Notes (Signed)
History     CSN: [id number]  Arrival date & time 11/18/12  1807   First MD Initiated Contact with Patient 11/18/12 2141      Chief Complaint  Patient presents with  . Abdominal Pain    (Consider location/radiation/quality/duration/timing/severity/associated sxs/prior treatment) HPI Comments: 67 y.o. female w/ pmh of gallbladder surgery, bladder surgery, presents to the Er w/ the cc of periumbilical pain. Pt states that over the past week, she has had intermittent periods of abdominal pain. However, today, she developed n/v -- vomited at least 5 times today -- nonbloody and nonbilious. She states she has had associated pain near her umbilical area. She states she has not had this type of pain before. No fevers associated w/ this.   Patient is a 67 y.o. female presenting with general illness. The history is provided by the patient.  Illness Location:  Periumbilical  Severity:  Mild Onset quality:  Gradual Timing:  Constant Progression:  Worsening Chronicity:  New Associated symptoms: abdominal pain, nausea and vomiting   Associated symptoms: no chest pain, no cough, no diarrhea, no fatigue, no fever, no headaches, no rash and no wheezing     Past Medical History  Diagnosis Date  . GERD (gastroesophageal reflux disease)   . Hyperlipidemia   . History of cervical dysplasia     CIN II  . H/O hiatal hernia   . Female cystocele   . History of peritonitis     2006--  S/P PERFORATED BOWEL INJURY DURING SURGERY  . Arthritis     KNEES  . CTS (carpal tunnel syndrome)     BILATERAL LEFT > RIGHT (OCC. HAND NUMBNESS)    Past Surgical History  Procedure Laterality Date  . Nasal septum surgery  70's    REPAIR DEVIATED SEPTUM  . Laparoscopic cholecystectomy  2006    W/ PERFERATED BOWEL (INJURY)  . Tubal ligation  1980'S  . Abdominal hysterectomy  1991    W/ BILATERAL SALPINGOOPHORECTOMY  . Vaginal prolapse repair N/A 08/15/2012    Procedure: VAGINAL VAULT SUSPENSION;  Surgeon:  Kathi Ludwig, MD;  [facility];  Service: Urology;  Laterality: N/A;  ANTERIOR VAULT REPAIR WITH XENFORE    Family History  Problem Relation Age of Onset  . Coronary artery disease Mother   . Other Mother     cva  . Hypertension Mother   . Heart disease Mother   . Other Father     CVA  . Stroke Father   . Diabetes Other   . Coronary artery disease Other   . Hypertension Other   . Stroke Other   . Cancer Other     breast  . Cancer Sister     breast  . Cancer Brother     prancreatic  . Cancer Sister     breast  . Colon cancer Neg Hx     History  Substance Use Topics  . Smoking status: Never Smoker   . Smokeless tobacco: Never Used  . Alcohol Use: No    OB History   Grav Para Term Preterm Abortions TAB SAB Ect Mult Living                  Review of Systems  Constitutional: Negative for fever, chills and fatigue.  HENT: Negative for facial swelling, drooling, neck pain and dental problem.   Eyes: Negative for pain, discharge and itching.  Respiratory: Negative for cough, choking, wheezing and stridor.   Cardiovascular: Negative for chest  pain.  Gastrointestinal: Positive for nausea, vomiting and abdominal pain. Negative for diarrhea.  Endocrine: Negative for cold intolerance and heat intolerance.  Genitourinary: Negative for vaginal discharge, difficulty urinating and vaginal pain.  Skin: Negative for pallor and rash.  Neurological: Negative for dizziness, light-headedness and headaches.  Psychiatric/Behavioral: Negative for behavioral problems and agitation.    Allergies  Advil; Bran; Hydrocodone; Lipitor; and Pravachol  Home Medications   Current Outpatient Rx  Name  Route  Sig  Dispense  Refill  . acetaminophen (TYLENOL) 500 MG tablet   Oral   Take 500-750 mg by mouth every 6 (six) hours as needed for pain.         . Cholecalciferol (VITAMIN D3) 400 UNITS CAPS   Oral   Take 1 capsule by mouth 3 (three) times a week.           . estradiol (ESTRACE) 0.1 MG/GM vaginal cream   Vaginal   Place 2 g vaginally 3 (three) times a week.         . Multiple Vitamin (MULTIVITAMIN) tablet   Oral   Take 1 tablet by mouth every other day.          Marland Kitchen omeprazole (PRILOSEC) 20 MG capsule   Oral   Take 20 mg by mouth every morning.         Marland Kitchen OVER THE COUNTER MEDICATION   Rectal   Place 1 suppository rectally daily as needed (for hemmorhoids).         . polyethylene glycol (MIRALAX / GLYCOLAX) packet   Oral   Take 17 g by mouth daily.         . simvastatin (ZOCOR) 20 MG tablet   Oral   Take 20 mg by mouth daily.          Marland Kitchen tetrahydrozoline (VISINE) 0.05 % ophthalmic solution   Both Eyes   Place 2 drops into both eyes daily as needed (for dry eyes).           BP 148/78  Pulse 75  Temp(Src) 97.7 F (36.5 C) (Oral)  Resp 16  SpO2 100%  Physical Exam  Constitutional: She is oriented to person, place, and time. She appears well-developed. No distress.  HENT:  Head: Normocephalic and atraumatic.  Eyes: Pupils are equal, round, and reactive to light. Right eye exhibits no discharge. Left eye exhibits no discharge.  Neck: Neck supple. No tracheal deviation present.  Cardiovascular: Normal rate.  Exam reveals no gallop and no friction rub.   Pulmonary/Chest: No stridor. No respiratory distress. She has no wheezes.  Abdominal: Soft. She exhibits no distension. There is tenderness. There is no rebound.  Periumbilical ttp   Musculoskeletal: She exhibits no edema and no tenderness.  Neurological: She is alert and oriented to person, place, and time.  Skin: Skin is warm. She is not diaphoretic.    ED Course  Procedures (including critical care time)  Labs Reviewed  CBC WITH DIFFERENTIAL - Abnormal; Notable for the following:    Hemoglobin 15.7 (*)    HCT 46.3 (*)    Neutrophils Relative % 82 (*)    All other components within normal limits  COMPREHENSIVE METABOLIC PANEL - Abnormal;  Notable for the following:    Glucose, Bld 152 (*)    GFR calc non Af Amer 86 (*)    All other components within normal limits  URINALYSIS, ROUTINE W REFLEX MICROSCOPIC - Abnormal; Notable for the following:    Hgb urine dipstick TRACE (*)  All other components within normal limits  LIPASE, BLOOD  URINE MICROSCOPIC-ADD ON   Ct Abdomen Pelvis W Contrast  11/18/2012   *RADIOLOGY REPORT*  Clinical Data: Intermittent upper abdominal pain.  Previous bowel perforation in 2006.  CT ABDOMEN AND PELVIS WITH CONTRAST  Technique:  Multidetector CT imaging of the abdomen and pelvis was performed following the standard protocol during bolus administration of intravenous contrast.  Contrast: OMNIPAQUE IOHEXOL 300 MG/ML  SOLN  Comparison: 09/17/2009  Findings: Interval mildly to moderately dilated proximal small bowel loops and normal caliber distal small bowel loops.  The dilatation is most pronounced to the level of a small bowel to small bowel anastomosis in the left upper pelvis.  There is less dilatation distal to the anastomosis and the small bowel then becomes normal in caliber more distally.  Mild diffuse low density of the liver relative to the spleen. Multiple liver cysts, without significant change.  Cholecystectomy clips.  Stable small right renal cyst.  Unremarkable left kidney, adrenal glands, pancreas, spleen and urinary bladder.  Surgically absent uterus.  No adnexal masses or enlarged lymph nodes.  Stable linear scarring at the lung bases.  Lumbar and lower thoracic spine degenerative changes.  IMPRESSION:  1.  Partial small bowel obstruction, beginning at the level of a small bowel to small bowel anastomosis in the left upper pelvis. 2.  Mild hepatic steatosis.   Original Report Authenticated By: Beckie Salts, M.D.     MDM  Pt w/ periumbilical ttp. She has had associated inability to tolerate PO as well, w/ multiple episodes of vomitus. Has had multiple abdominal surgeries prior. Has not  had pain like this or obstruction prior. Concern for obstruction based on HPI and exam. Will get CT abd pelvis to further evaluate.   CT scan confirms that pt has partial small bowel obstruction. NG is placed. Pt is admitted to medicine team for further eval and care.     1. Partial small bowel obstruction   2. Carpal tunnel syndrome, unspecified laterality   3. Pain in joint, lower leg, unspecified laterality            Bernadene Person, MD 11/19/12 [phone]

## 2012-11-18 NOTE — ED Notes (Signed)
Reports hx of stomach virus 2 weeks ago and having intermittent upper GI pain since then, describes pain as spasms and has n/v. No acute distress noted at this time.

## 2012-11-18 NOTE — ED Provider Notes (Signed)
66 year old female multiple abdominal surgeries in the past who presents with a complaint of intermittent abdominal pain over the last several days, much worse today and associated with multiple episodes of nausea and vomiting. She has passed a small amount of gas and a stool but has not been able to tolerate any food since breakfast approximately 18 hours ago. On my exam she has tenderness in her epigastrium and periumbilical area though she does not have a tympanitic sounds to percussion. Heart and lung sounds are normal, no signs of tachycardia, she does not appear ill and she is not febrile. She will require CT scan for further evaluation to rule out things such as small bowel obstruction, ileus, pancreatitis. She does not have a leukocytosis.    Kimberly Roller, MD 11/18/12 2226

## 2012-11-18 NOTE — ED Notes (Signed)
Patient done with contrast. CT notified.  

## 2012-11-18 NOTE — ED Notes (Signed)
Pt stated she went to the restroom numerous times in the lobby. Will try to collect at a later time

## 2012-11-19 ENCOUNTER — Inpatient Hospital Stay (HOSPITAL_COMMUNITY): Payer: Medicare HMO

## 2012-11-19 DIAGNOSIS — E785 Hyperlipidemia, unspecified: Secondary | ICD-10-CM

## 2012-11-19 DIAGNOSIS — K56609 Unspecified intestinal obstruction, unspecified as to partial versus complete obstruction: Secondary | ICD-10-CM

## 2012-11-19 DIAGNOSIS — K219 Gastro-esophageal reflux disease without esophagitis: Secondary | ICD-10-CM

## 2012-11-19 LAB — URINALYSIS, ROUTINE W REFLEX MICROSCOPIC
Glucose, UA: NEGATIVE mg/dL
Ketones, ur: NEGATIVE mg/dL
Leukocytes, UA: NEGATIVE
Nitrite: NEGATIVE
Specific Gravity, Urine: 1.025 (ref 1.005–1.030)
pH: 6.5 (ref 5.0–8.0)

## 2012-11-19 LAB — CBC
MCH: 30 pg (ref 26.0–34.0)
MCHC: 33 g/dL (ref 30.0–36.0)
MCV: 90.9 fL (ref 78.0–100.0)
Platelets: 275 10*3/uL (ref 150–400)
RDW: 14.2 % (ref 11.5–15.5)

## 2012-11-19 LAB — URINE MICROSCOPIC-ADD ON

## 2012-11-19 LAB — BASIC METABOLIC PANEL
BUN: 12 mg/dL (ref 6–23)
CO2: 29 mEq/L (ref 19–32)
Calcium: 9.1 mg/dL (ref 8.4–10.5)
Creatinine, Ser: 0.67 mg/dL (ref 0.50–1.10)
Glucose, Bld: 111 mg/dL — ABNORMAL HIGH (ref 70–99)

## 2012-11-19 MED ORDER — ESTRADIOL 0.1 MG/GM VA CREA
1.0000 | TOPICAL_CREAM | VAGINAL | Status: DC
  Administered 2012-11-21: 1 via VAGINAL
  Filled 2012-11-19 (×2): qty 42.5

## 2012-11-19 MED ORDER — SODIUM CHLORIDE 0.9 % IV SOLN
INTRAVENOUS | Status: AC
  Administered 2012-11-19: 01:00:00 via INTRAVENOUS

## 2012-11-19 MED ORDER — PANTOPRAZOLE SODIUM 40 MG PO TBEC: 40.0000 mg | DELAYED_RELEASE_TABLET | Freq: Every day | ORAL | Status: DC

## 2012-11-19 MED ORDER — CHLORHEXIDINE GLUCONATE 0.12 % MT SOLN
15.0000 mL | Freq: Two times a day (BID) | OROMUCOSAL | Status: DC
  Administered 2012-11-19 – 2012-11-21 (×6): 15 mL via OROMUCOSAL
  Filled 2012-11-19 (×6): qty 15

## 2012-11-19 MED ORDER — OXYCODONE HCL 5 MG PO TABS: 5.0000 mg | ORAL_TABLET | ORAL | Status: DC | PRN

## 2012-11-19 MED ORDER — POLYETHYLENE GLYCOL 3350 17 G PO PACK
17.0000 g | PACK | Freq: Every day | ORAL | Status: DC
  Filled 2012-11-19: qty 1

## 2012-11-19 MED ORDER — ACETAMINOPHEN 500 MG PO TABS
500.0000 mg | ORAL_TABLET | Freq: Four times a day (QID) | ORAL | Status: DC | PRN
  Administered 2012-11-20: 500 mg via ORAL
  Filled 2012-11-19: qty 1

## 2012-11-19 MED ORDER — BIOTENE DRY MOUTH MT LIQD
15.0000 mL | Freq: Two times a day (BID) | OROMUCOSAL | Status: DC
  Administered 2012-11-19 – 2012-11-20 (×3): 15 mL via OROMUCOSAL

## 2012-11-19 MED ORDER — NAPHAZOLINE HCL 0.1 % OP SOLN
1.0000 [drp] | Freq: Four times a day (QID) | OPHTHALMIC | Status: DC | PRN
  Filled 2012-11-19: qty 15

## 2012-11-19 MED ORDER — ONDANSETRON HCL 4 MG/2ML IJ SOLN: 4.0000 mg | Freq: Four times a day (QID) | INTRAMUSCULAR | Status: DC | PRN

## 2012-11-19 MED ORDER — ESTRADIOL 0.1 MG/GM VA CREA: 2.0000 g | TOPICAL_CREAM | VAGINAL | Status: DC

## 2012-11-19 MED ORDER — ENOXAPARIN SODIUM 30 MG/0.3ML ~~LOC~~ SOLN
30.0000 mg | SUBCUTANEOUS | Status: DC
  Administered 2012-11-19 – 2012-11-22 (×4): 30 mg via SUBCUTANEOUS
  Filled 2012-11-19 (×6): qty 0.3

## 2012-11-19 MED ORDER — SIMVASTATIN 20 MG PO TABS
20.0000 mg | ORAL_TABLET | Freq: Every day | ORAL | Status: DC
  Filled 2012-11-19: qty 1

## 2012-11-19 MED ORDER — PANTOPRAZOLE SODIUM 40 MG IV SOLR
40.0000 mg | INTRAVENOUS | Status: DC
  Administered 2012-11-19 – 2012-11-22 (×4): 40 mg via INTRAVENOUS
  Filled 2012-11-19 (×6): qty 40

## 2012-11-19 MED ORDER — BISACODYL 10 MG RE SUPP
10.0000 mg | Freq: Every day | RECTAL | Status: DC
  Administered 2012-11-19 – 2012-11-22 (×4): 10 mg via RECTAL
  Filled 2012-11-19 (×4): qty 1

## 2012-11-19 MED ORDER — ONDANSETRON HCL 4 MG PO TABS: 4.0000 mg | ORAL_TABLET | Freq: Four times a day (QID) | ORAL | Status: DC | PRN

## 2012-11-19 MED ORDER — MORPHINE SULFATE 2 MG/ML IJ SOLN
1.0000 mg | INTRAMUSCULAR | Status: DC | PRN
  Administered 2012-11-19 – 2012-11-20 (×3): 1 mg via INTRAVENOUS
  Filled 2012-11-19 (×3): qty 1

## 2012-11-19 NOTE — H&P (Signed)
[facility] History and Physical  [name]:[id number] DOB: [birth year] DOA: 11/18/2012  Referring physician: ED physician PCP: Illene Regulus, MD   Chief Complaint: Abdominal pain   HPI:  Patient is 67 year old female who presents to [facility] with main concern of progressively worsening generalized abdominal pain, sharp and intermittent in nature, 7/10 in severity when present, aggravated by oral intake, no specific alleviating factors, nonradiating, associated with malaise and constipation, nausea and nonbloody vomiting. Patient reports similar events in the past but she is unaware of the exact cause. She reports history of abdominal surgeries. She denies fevers and chills, no specific urinary concerns. Patient denies chest pain or shortness of breath, no other systemic concerns.  Emergency Department, patient unable to tolerate by mouth intake, NG tube placed in emergency department, CT scan significant for partial small bowel obstruction. [facility] asked to admit for further management of SBO.  Assessment and Plan: Abdominal pain - Secondary to partial small bowel obstruction - Will admit patient to medical floor for further evaluation and management - Will start with providing supportive care, IV fluids, analgesia and antiemetics as needed - Placed on bowel regimen - Keep n.p.o. for now as patient has NG tube  Code Status: Full Family Communication: Pt at bedside Disposition Plan:  admit to medical floor   Review of Systems:  Constitutional: Negative for fever, chills. Negative for diaphoresis.  HENT: Negative for hearing loss, ear pain, nosebleeds, congestion, sore throat, neck pain, tinnitus and ear discharge.   Eyes: Negative for blurred vision, double vision, photophobia, pain, discharge and redness.  Respiratory: Negative for cough, hemoptysis, sputum production, shortness of breath, wheezing and stridor.   Cardiovascular: Negative for chest pain,  palpitations, orthopnea, claudication and leg swelling.  Gastrointestinal: positive  for nausea, vomiting and abdominal pain. Negative for heartburn, constipation, blood in stool and melena.  Genitourinary: Negative for dysuria, urgency, frequency, hematuria and flank pain.  Musculoskeletal: Negative for myalgias, back pain, joint pain and falls.  Skin: Negative for itching and rash.  Neurological: Negative for dizziness and weakness. Negative for tingling, tremors, sensory change, speech change, focal weakness, loss of consciousness and headaches.  Endo/Heme/Allergies: Negative for environmental allergies and polydipsia. Does not bruise/bleed easily.  Psychiatric/Behavioral: Negative for suicidal ideas. The patient is not nervous/anxious.      Past Medical History  Diagnosis Date  . GERD (gastroesophageal reflux disease)   . Hyperlipidemia   . History of cervical dysplasia     CIN II  . H/O hiatal hernia   . Female cystocele   . History of peritonitis     2006--  S/P PERFORATED BOWEL INJURY DURING SURGERY  . Arthritis     KNEES  . CTS (carpal tunnel syndrome)     BILATERAL LEFT > RIGHT (OCC. HAND NUMBNESS)    Past Surgical History  Procedure Laterality Date  . Nasal septum surgery  70's    REPAIR DEVIATED SEPTUM  . Laparoscopic cholecystectomy  2006    W/ PERFERATED BOWEL (INJURY)  . Tubal ligation  1980'S  . Abdominal hysterectomy  1991    W/ BILATERAL SALPINGOOPHORECTOMY  . Vaginal prolapse repair N/A 08/15/2012    Procedure: VAGINAL VAULT SUSPENSION;  Surgeon: Kathi Ludwig, MD;  Location: [facility];  Service: Urology;  Laterality: N/A;  ANTERIOR VAULT REPAIR WITH XENFORE    Social History:  reports that she has never smoked. She has never used smokeless tobacco. She reports that she does not drink alcohol or use  illicit drugs.  Allergies  Allergen Reactions  . Advil (Ibuprofen) Swelling    Tongue swells  . Bran (Wheat Bran) Other (See Comments)     Very bad GI pain  . Hydrocodone Nausea Only and Other (See Comments)     TACHYCARDIA  . Lipitor (Atorvastatin) Other (See Comments)    Bad dreams  . Pravachol (Pravastatin Sodium) Other (See Comments)    Bad dreams    Family History  Problem Relation Age of Onset  . Coronary artery disease Mother   . Other Mother     cva  . Hypertension Mother   . Heart disease Mother   . Other Father     CVA  . Stroke Father   . Diabetes Other   . Coronary artery disease Other   . Hypertension Other   . Stroke Other   . Cancer Other     breast  . Cancer Sister     breast  . Cancer Brother     prancreatic  . Cancer Sister     breast  . Colon cancer Neg Hx     Prior to Admission medications   Medication Sig Start Date End Date Taking? Authorizing Provider  acetaminophen (TYLENOL) 500 MG tablet Take 500-750 mg by mouth every 6 (six) hours as needed for pain.   Yes Historical Provider, MD  Cholecalciferol (VITAMIN D3) 400 UNITS CAPS Take 1 capsule by mouth 3 (three) times a week.    Yes Historical Provider, MD  estradiol (ESTRACE) 0.1 MG/GM vaginal cream Place 2 g vaginally 3 (three) times a week.   Yes Historical Provider, MD  Multiple Vitamin (MULTIVITAMIN) tablet Take 1 tablet by mouth every other day.    Yes Historical Provider, MD  omeprazole (PRILOSEC) 20 MG capsule Take 20 mg by mouth every morning.   Yes Historical Provider, MD  OVER THE COUNTER MEDICATION Place 1 suppository rectally daily as needed (for hemmorhoids).   Yes Historical Provider, MD  polyethylene glycol (MIRALAX / GLYCOLAX) packet Take 17 g by mouth daily.   Yes Historical Provider, MD  simvastatin (ZOCOR) 20 MG tablet Take 20 mg by mouth daily.  03/12/12  Yes Jacques Navy, MD  tetrahydrozoline (VISINE) 0.05 % ophthalmic solution Place 2 drops into both eyes daily as needed (for dry eyes).   Yes Historical Provider, MD    Physical Exam: Filed Vitals:   11/18/12 2139 11/18/12 2200 11/18/12 2230 11/18/12 2300   BP: 148/78 136/74 148/78 139/83  Pulse: 75 83 77 73  Temp: 97.7 F (36.5 C)     TempSrc: Oral     Resp: 16     SpO2: 100% 98% 98% 97%    Physical Exam  Constitutional: Appears well-developed and well-nourished. No distress.  HENT: Normocephalic. External right and left ear normal. Oropharynx is clear and moist.  Eyes: Conjunctivae and EOM are normal. PERRLA, no scleral icterus.  Neck: Normal ROM. Neck supple. No JVD. No tracheal deviation. No thyromegaly.  CVS: RRR, S1/S2 +, no murmurs, no gallops, no carotid bruit.  Pulmonary: Effort and breath sounds normal, no stridor, rhonchi, wheezes, rales.  Abdominal: Soft. BS +,  no distension, tenderness in epigastric area, no rebound or guarding.  Musculoskeletal: Normal range of motion. No edema and no tenderness.  Lymphadenopathy: No lymphadenopathy noted, cervical, inguinal. Neuro: Alert. Normal reflexes, muscle tone coordination. No cranial nerve deficit. Skin: Skin is warm and dry. No rash noted. Not diaphoretic. No erythema. No pallor.  Psychiatric: Normal mood and affect. Behavior,  judgment, thought content normal.   Labs on Admission:  Basic Metabolic Panel:  Recent Labs Lab 11/18/12 1836  NA 138  K 4.2  CL 99  CO2 26  GLUCOSE 152*  BUN 14  CREATININE 0.75  CALCIUM 10.5   Liver Function Tests:  Recent Labs Lab 11/18/12 1836  AST 28  ALT 27  ALKPHOS 81  BILITOT 0.8  PROT 8.3  ALBUMIN 4.5    Recent Labs Lab 11/18/12 1836  LIPASE 25   CBC:  Recent Labs Lab 11/18/12 1836  WBC 9.2  NEUTROABS 7.6  HGB 15.7*  HCT 46.3*  MCV 91.0  PLT 253   Radiological Exams on Admission: Ct Abdomen Pelvis W Contrast  11/18/2012   *RADIOLOGY REPORT*  Clinical Data: Intermittent upper abdominal pain.  Previous bowel perforation in 2006.  CT ABDOMEN AND PELVIS WITH CONTRAST  Technique:  Multidetector CT imaging of the abdomen and pelvis was performed following the standard protocol during bolus administration of  intravenous contrast.  Contrast: OMNIPAQUE IOHEXOL 300 MG/ML  SOLN  Comparison: 09/17/2009  Findings: Interval mildly to moderately dilated proximal small bowel loops and normal caliber distal small bowel loops.  The dilatation is most pronounced to the level of a small bowel to small bowel anastomosis in the left upper pelvis.  There is less dilatation distal to the anastomosis and the small bowel then becomes normal in caliber more distally.  Mild diffuse low density of the liver relative to the spleen. Multiple liver cysts, without significant change.  Cholecystectomy clips.  Stable small right renal cyst.  Unremarkable left kidney, adrenal glands, pancreas, spleen and urinary bladder.  Surgically absent uterus.  No adnexal masses or enlarged lymph nodes.  Stable linear scarring at the lung bases.  Lumbar and lower thoracic spine degenerative changes.  IMPRESSION:  1.  Partial small bowel obstruction, beginning at the level of a small bowel to small bowel anastomosis in the left upper pelvis. 2.  Mild hepatic steatosis.   Original Report Authenticated By: Beckie Salts, M.D.    EKG: Normal sinus rhythm, no ST/T wave changes  Debbora Presto, MD  [facility] Pager [phone]  If 7PM-7AM, please contact night-coverage [email or web address] Password [facility] 11/19/2012, 12:07 AM

## 2012-11-19 NOTE — Progress Notes (Signed)
Patient ID: [name]  female  ZOX:[id number]    DOB: 04-May-1946    DOA: 11/18/2012  PCP: Illene Regulus, MD  Assessment/Plan: Principal Problem:   SBO (small bowel obstruction): Per patient bowel movement last night - Repeat abdominal x-ray obtained, still persistent small SBO - Placed back on NG tube to intermittent wall suction, bowel rest, n.p.o. IVF - Will obtain 2 view abdominal x-ray in a.m. -If no improvement by tomorrow, will consult surgery   Active Problems:   Other and unspecified hyperlipidemia: Hold statins    GERD - Placed on IV PPI  DVT Prophylaxis:  Code Status:  Disposition:    Subjective:  states bowel moments last night, NGT   Objective: Weight change:   Intake/Output Summary (Last 24 hours) at 11/19/12 1144 Last data filed at 11/19/12 1000  Gross per 24 hour  Intake   1675 ml  Output    800 ml  Net    875 ml   Blood pressure 134/73, pulse 72, temperature 98.1 F (36.7 C), temperature source Oral, resp. rate 18, height 5' (1.524 m), weight 68.811 kg (151 lb 11.2 oz), SpO2 100.00%.  Physical Exam: General: Alert and awake, oriented x3, not in any acute distress. CVS: S1-S2 clear, no murmur rubs or gallops Chest: clear to auscultation bilaterally, no wheezing, rales or rhonchi Abdomen: soft nontender, hypoactive bowel sounds  Extremities: no cyanosis, clubbing or edema noted bilaterally   Lab Results: Basic Metabolic Panel:  Recent Labs Lab 11/18/12 1836 11/19/12 0519  NA 138 141  K 4.2 3.9  CL 99 103  CO2 26 29  GLUCOSE 152* 111*  BUN 14 12  CREATININE 0.75 0.67  CALCIUM 10.5 9.1   Liver Function Tests:  Recent Labs Lab 11/18/12 1836  AST 28  ALT 27  ALKPHOS 81  BILITOT 0.8  PROT 8.3  ALBUMIN 4.5    Recent Labs Lab 11/18/12 1836  LIPASE 25   No results found for this basename: AMMONIA,  in the last 168 hours CBC:  Recent Labs Lab 11/18/12 1836 11/19/12 0519  WBC 9.2 9.7  NEUTROABS 7.6  --   HGB 15.7* 13.5   HCT 46.3* 40.9  MCV 91.0 90.9  PLT 253 275   Cardiac Enzymes: No results found for this basename: CKTOTAL, CKMB, CKMBINDEX, TROPONINI,  in the last 168 hours BNP: No components found with this basename: POCBNP,  CBG: No results found for this basename: GLUCAP,  in the last 168 hours   Micro Results: No results found for this or any previous visit (from the past 240 hour(s)).  Studies/Results: Ct Abdomen Pelvis W Contrast  11/18/2012   *RADIOLOGY REPORT*  Clinical Data: Intermittent upper abdominal pain.  Previous bowel perforation in 2006.  CT ABDOMEN AND PELVIS WITH CONTRAST  Technique:  Multidetector CT imaging of the abdomen and pelvis was performed following the standard protocol during bolus administration of intravenous contrast.  Contrast: OMNIPAQUE IOHEXOL 300 MG/ML  SOLN  Comparison: 09/17/2009  Findings: Interval mildly to moderately dilated proximal small bowel loops and normal caliber distal small bowel loops.  The dilatation is most pronounced to the level of a small bowel to small bowel anastomosis in the left upper pelvis.  There is less dilatation distal to the anastomosis and the small bowel then becomes normal in caliber more distally.  Mild diffuse low density of the liver relative to the spleen. Multiple liver cysts, without significant change.  Cholecystectomy clips.  Stable small right renal cyst.  Unremarkable  left kidney, adrenal glands, pancreas, spleen and urinary bladder.  Surgically absent uterus.  No adnexal masses or enlarged lymph nodes.  Stable linear scarring at the lung bases.  Lumbar and lower thoracic spine degenerative changes.  IMPRESSION:  1.  Partial small bowel obstruction, beginning at the level of a small bowel to small bowel anastomosis in the left upper pelvis. 2.  Mild hepatic steatosis.   Original Report Authenticated By: Beckie Salts, M.D.   Dg Abd 2 Views  11/19/2012   *RADIOLOGY REPORT*  Clinical Data: Small bowel obstruction, mid/right  abdominal pain, vomiting  ABDOMEN - 2 VIEW  Comparison: CT abdomen pelvis - 11/18/2012  Findings:  Previously ingested enteric contrast is seen extending to the level of the rectum.  There is mild gaseous distension of several loops of small bowel within the left mid hemiabdomen with index loop measuring approximately 3.2 cm in diameter.  No pneumoperitoneum, pneumatosis or portal venous gas. An enteric tube tip and side port overlying the expected location of the mid body of the stomach.  There is a minimal amount of excreted contrast within the urinary bladder.  Post cholecystectomy.  Limited visualization of the lower thorax suggests bibasilar opacities, favored to represent atelectasis.  Mild scoliotic curvature of the thoracolumbar spine.  IMPRESSION: Findings suggestive of possible very mild persistent partial small bowel obstruction.   Original Report Authenticated By: Tacey Ruiz, MD    Medications: Scheduled Meds: . antiseptic oral rinse  15 mL Mouth Rinse q12n4p  . bisacodyl  10 mg Rectal Daily  . chlorhexidine  15 mL Mouth Rinse BID  . enoxaparin (LOVENOX) injection  30 mg Subcutaneous Q24H  . [START ON 11/21/2012] estradiol  1 Applicatorful Vaginal 3 times weekly  . pantoprazole (PROTONIX) IV  40 mg Intravenous Q24H      LOS: 1 day   RAI,RIPUDEEP M.D. [facility] 11/19/2012, 11:44 AM Pager: [phone]  If 7PM-7AM, please contact night-coverage [email or web address] Password [email or web address]

## 2012-11-20 ENCOUNTER — Inpatient Hospital Stay (HOSPITAL_COMMUNITY): Payer: Medicare HMO

## 2012-11-20 DIAGNOSIS — K219 Gastro-esophageal reflux disease without esophagitis: Secondary | ICD-10-CM

## 2012-11-20 DIAGNOSIS — K56609 Unspecified intestinal obstruction, unspecified as to partial versus complete obstruction: Secondary | ICD-10-CM

## 2012-11-20 DIAGNOSIS — E785 Hyperlipidemia, unspecified: Secondary | ICD-10-CM

## 2012-11-20 MED ORDER — PHENOL 1.4 % MT LIQD
1.0000 | OROMUCOSAL | Status: DC | PRN
  Administered 2012-11-20: 1 via OROMUCOSAL
  Filled 2012-11-20: qty 177

## 2012-11-20 MED ORDER — KCL IN DEXTROSE-NACL 40-5-0.9 MEQ/L-%-% IV SOLN
INTRAVENOUS | Status: DC
  Administered 2012-11-20 – 2012-11-22 (×6): via INTRAVENOUS
  Filled 2012-11-20 (×7): qty 1000

## 2012-11-20 NOTE — Consult Note (Signed)
Reason for Consult:SBO Referring Physician: [name] is an 67 y.o. female.  HPI: The pt is a 67 y/o with multp prev surgeries to include chole and SB injury with abscesses.  Pt with 2d h/o abd pain and emesis prior to admission.  Pt was evaluated with a CT which showed ppartial SBO.  KUBS have shown contrast in the colon now.  Some con't dilated loops of SB. Pt had a BM today and has been passing gas.  Past Medical History  Diagnosis Date  . GERD (gastroesophageal reflux disease)   . Hyperlipidemia   . History of cervical dysplasia     CIN II  . H/O hiatal hernia   . Female cystocele   . History of peritonitis     2006--  S/P PERFORATED BOWEL INJURY DURING SURGERY  . Arthritis     KNEES  . CTS (carpal tunnel syndrome)     BILATERAL LEFT > RIGHT (OCC. HAND NUMBNESS)    Past Surgical History  Procedure Laterality Date  . Nasal septum surgery  70's    REPAIR DEVIATED SEPTUM  . Laparoscopic cholecystectomy  2006    W/ PERFERATED BOWEL (INJURY)  . Tubal ligation  1980'S  . Abdominal hysterectomy  1991    W/ BILATERAL SALPINGOOPHORECTOMY  . Vaginal prolapse repair N/A 08/15/2012    Procedure: VAGINAL VAULT SUSPENSION;  Surgeon: Kathi Ludwig, MD;  Location: [facility];  Service: Urology;  Laterality: N/A;  ANTERIOR VAULT REPAIR WITH XENFORE    Family History  Problem Relation Age of Onset  . Coronary artery disease Mother   . Other Mother     cva  . Hypertension Mother   . Heart disease Mother   . Other Father     CVA  . Stroke Father   . Diabetes Other   . Coronary artery disease Other   . Hypertension Other   . Stroke Other   . Cancer Other     breast  . Cancer Sister     breast  . Cancer Brother     prancreatic  . Cancer Sister     breast  . Colon cancer Neg Hx     Social History:  reports that she has never smoked. She has never used smokeless tobacco. She reports that she does not drink alcohol or use illicit  drugs.  Allergies:  Allergies  Allergen Reactions  . Advil (Ibuprofen) Swelling    Tongue swells  . Bran (Wheat Bran) Other (See Comments)    Very bad GI pain  . Hydrocodone Nausea Only and Other (See Comments)     TACHYCARDIA  . Lipitor (Atorvastatin) Other (See Comments)    Bad dreams  . Pravachol (Pravastatin Sodium) Other (See Comments)    Bad dreams    Medications: I have reviewed the patient's current medications.  Results for orders placed during the hospital encounter of 11/18/12 (from the past 48 hour(s))  CBC WITH DIFFERENTIAL     Status: Abnormal   Collection Time    11/18/12  6:36 PM      Result Value Range   WBC 9.2  4.0 - 10.5 K/uL   RBC 5.09  3.87 - 5.11 MIL/uL   Hemoglobin 15.7 (*) 12.0 - 15.0 g/dL   HCT 16.1 (*) 09.6 - 04.5 %   MCV 91.0  78.0 - 100.0 fL   MCH 30.8  26.0 - 34.0 pg   MCHC 33.9  30.0 - 36.0 g/dL  RDW 13.8  11.5 - 15.5 %   Platelets 253  150 - 400 K/uL   Neutrophils Relative % 82 (*) 43 - 77 %   Neutro Abs 7.6  1.7 - 7.7 K/uL   Lymphocytes Relative 12  12 - 46 %   Lymphs Abs 1.1  0.7 - 4.0 K/uL   Monocytes Relative 6  3 - 12 %   Monocytes Absolute 0.5  0.1 - 1.0 K/uL   Eosinophils Relative 0  0 - 5 %   Eosinophils Absolute 0.0  0.0 - 0.7 K/uL   Basophils Relative 0  0 - 1 %   Basophils Absolute 0.0  0.0 - 0.1 K/uL  COMPREHENSIVE METABOLIC PANEL     Status: Abnormal   Collection Time    11/18/12  6:36 PM      Result Value Range   Sodium 138  135 - 145 mEq/L   Potassium 4.2  3.5 - 5.1 mEq/L   Chloride 99  96 - 112 mEq/L   CO2 26  19 - 32 mEq/L   Glucose, Bld 152 (*) 70 - 99 mg/dL   BUN 14  6 - 23 mg/dL   Creatinine, Ser 1.61  0.50 - 1.10 mg/dL   Calcium 09.6  8.4 - 04.5 mg/dL   Total Protein 8.3  6.0 - 8.3 g/dL   Albumin 4.5  3.5 - 5.2 g/dL   AST 28  0 - 37 U/L   ALT 27  0 - 35 U/L   Alkaline Phosphatase 81  39 - 117 U/L   Total Bilirubin 0.8  0.3 - 1.2 mg/dL   GFR calc non Af Amer 86 (*) >90 mL/min   GFR calc Af Amer >90  >90  mL/min   Comment:            The eGFR has been calculated     using the CKD EPI equation.     This calculation has not been     validated in all clinical     situations.     eGFR's persistently     <90 mL/min signify     possible Chronic Kidney Disease.  LIPASE, BLOOD     Status: None   Collection Time    11/18/12  6:36 PM      Result Value Range   Lipase 25  11 - 59 U/L  URINALYSIS, ROUTINE W REFLEX MICROSCOPIC     Status: Abnormal   Collection Time    11/18/12 11:47 PM      Result Value Range   Color, Urine YELLOW  YELLOW   APPearance CLEAR  CLEAR   Specific Gravity, Urine 1.025  1.005 - 1.030   pH 6.5  5.0 - 8.0   Glucose, UA NEGATIVE  NEGATIVE mg/dL   Hgb urine dipstick TRACE (*) NEGATIVE   Bilirubin Urine NEGATIVE  NEGATIVE   Ketones, ur NEGATIVE  NEGATIVE mg/dL   Protein, ur NEGATIVE  NEGATIVE mg/dL   Urobilinogen, UA 0.2  0.0 - 1.0 mg/dL   Nitrite NEGATIVE  NEGATIVE   Leukocytes, UA NEGATIVE  NEGATIVE  URINE MICROSCOPIC-ADD ON     Status: None   Collection Time    11/18/12 11:47 PM      Result Value Range   WBC, UA 0-2  <3 WBC/hpf   RBC / HPF 3-6  <3 RBC/hpf   Bacteria, UA RARE  RARE  BASIC METABOLIC PANEL     Status: Abnormal   Collection Time    11/19/12  5:19 AM      Result Value Range   Sodium 141  135 - 145 mEq/L   Potassium 3.9  3.5 - 5.1 mEq/L   Chloride 103  96 - 112 mEq/L   CO2 29  19 - 32 mEq/L   Glucose, Bld 111 (*) 70 - 99 mg/dL   BUN 12  6 - 23 mg/dL   Creatinine, Ser 4.09  0.50 - 1.10 mg/dL   Calcium 9.1  8.4 - 81.1 mg/dL   GFR calc non Af Amer 89 (*) >90 mL/min   GFR calc Af Amer >90  >90 mL/min   Comment:            The eGFR has been calculated     using the CKD EPI equation.     This calculation has not been     validated in all clinical     situations.     eGFR's persistently     <90 mL/min signify     possible Chronic Kidney Disease.  CBC     Status: None   Collection Time    11/19/12  5:19 AM      Result Value Range   WBC 9.7   4.0 - 10.5 K/uL   RBC 4.50  3.87 - 5.11 MIL/uL   Hemoglobin 13.5  12.0 - 15.0 g/dL   HCT 91.4  78.2 - 95.6 %   MCV 90.9  78.0 - 100.0 fL   MCH 30.0  26.0 - 34.0 pg   MCHC 33.0  30.0 - 36.0 g/dL   RDW 21.3  08.6 - 57.8 %   Platelets 275  150 - 400 K/uL    Ct Abdomen Pelvis W Contrast  11/18/2012   *RADIOLOGY REPORT*  Clinical Data: Intermittent upper abdominal pain.  Previous bowel perforation in 2006.  CT ABDOMEN AND PELVIS WITH CONTRAST  Technique:  Multidetector CT imaging of the abdomen and pelvis was performed following the standard protocol during bolus administration of intravenous contrast.  Contrast: OMNIPAQUE IOHEXOL 300 MG/ML  SOLN  Comparison: 09/17/2009  Findings: Interval mildly to moderately dilated proximal small bowel loops and normal caliber distal small bowel loops.  The dilatation is most pronounced to the level of a small bowel to small bowel anastomosis in the left upper pelvis.  There is less dilatation distal to the anastomosis and the small bowel then becomes normal in caliber more distally.  Mild diffuse low density of the liver relative to the spleen. Multiple liver cysts, without significant change.  Cholecystectomy clips.  Stable small right renal cyst.  Unremarkable left kidney, adrenal glands, pancreas, spleen and urinary bladder.  Surgically absent uterus.  No adnexal masses or enlarged lymph nodes.  Stable linear scarring at the lung bases.  Lumbar and lower thoracic spine degenerative changes.  IMPRESSION:  1.  Partial small bowel obstruction, beginning at the level of a small bowel to small bowel anastomosis in the left upper pelvis. 2.  Mild hepatic steatosis.   Original Report Authenticated By: Beckie Salts, M.D.   Dg Abd 2 Views  11/20/2012   *RADIOLOGY REPORT*  Clinical Data: Follow up partial small bowel obstruction  ABDOMEN - 2 VIEW  Comparison: 11/19/2012  Findings: Mildly prominent loop of small bowel in the right lower abdomen, possibly reflecting  partial small bowel obstruction versus adynamic ileus.  Residual contrast in the transverse and distal sigmoid colon, which excludes a complete obstruction.  Enteric tube terminates in the stomach.  Cholecystectomy clips.  IMPRESSION: Mildly  prominent loop of small bowel in the right lower abdomen, possibly reflecting partial small bowel obstruction versus adynamic ileus.  Residual contrast in the colon   Original Report Authenticated By: Charline Bills, M.D.   Dg Abd 2 Views  11/19/2012   *RADIOLOGY REPORT*  Clinical Data: Small bowel obstruction, mid/right abdominal pain, vomiting  ABDOMEN - 2 VIEW  Comparison: CT abdomen pelvis - 11/18/2012  Findings:  Previously ingested enteric contrast is seen extending to the level of the rectum.  There is mild gaseous distension of several loops of small bowel within the left mid hemiabdomen with index loop measuring approximately 3.2 cm in diameter.  No pneumoperitoneum, pneumatosis or portal venous gas. An enteric tube tip and side port overlying the expected location of the mid body of the stomach.  There is a minimal amount of excreted contrast within the urinary bladder.  Post cholecystectomy.  Limited visualization of the lower thorax suggests bibasilar opacities, favored to represent atelectasis.  Mild scoliotic curvature of the thoracolumbar spine.  IMPRESSION: Findings suggestive of possible very mild persistent partial small bowel obstruction.   Original Report Authenticated By: Tacey Ruiz, MD    Review of Systems  Constitutional: Negative.   HENT: Negative.   Eyes: Negative.   Cardiovascular: Negative.   Gastrointestinal: Positive for abdominal pain.  Genitourinary: Negative.   Musculoskeletal: Negative.   Skin: Negative.   Neurological: Negative.    Blood pressure 140/85, pulse 77, temperature 98.8 F (37.1 C), temperature source Oral, resp. rate 18, height 5' (1.524 m), weight 151 lb 11.2 oz (68.811 kg), SpO2 95.00%. Physical Exam   Constitutional: She is oriented to person, place, and time. She appears well-developed and well-nourished.  HENT:  Head: Normocephalic and atraumatic.  Eyes: Conjunctivae and EOM are normal. Pupils are equal, round, and reactive to light.  Neck: Normal range of motion. Neck supple.  Cardiovascular: Normal rate, regular rhythm and normal heart sounds.   Respiratory: Effort normal and breath sounds normal.  GI: Soft. Bowel sounds are normal. She exhibits distension. She exhibits no mass. There is no tenderness. There is no rebound.  Musculoskeletal: Normal range of motion.  Neurological: She is alert and oriented to person, place, and time.    Assessment/Plan: 67 y/o F with partial SBO  -NGT, NPO -IVF -serial abd exams   [name]., Jed Limerick 11/20/2012, 12:45 PM

## 2012-11-20 NOTE — Progress Notes (Addendum)
Patient ID: [name]  female  JYN:[id number]    DOB: [birth year]    DOA: 11/18/2012  PCP: Illene Regulus, MD  Assessment/Plan: Principal Problem:   SBO (small bowel obstruction): Per patient small bowel movement last night, has a history of multiple abdominal surgeries in the past - Repeat abdominal x-ray this AM shows mildly prominent loop of small bowel in the right lower abdomen possibly reflecting partial small bowel obstruction versus adynamic ileus   - continue NG tube to intermittent wall suction, bowel rest, n.p.o. IVF - will consult surgery, d/w Dr Derrell Lolling   Active Problems:   Other and unspecified hyperlipidemia: Hold statins    GERD - on IV PPI  DVT Prophylaxis:  Code Status:  Disposition:    Subjective:  states  small bowel moments last night, NGT +, No abdominal pain  Objective: Weight change:   Intake/Output Summary (Last 24 hours) at 11/20/12 1157 Last data filed at 11/20/12 0326  Gross per 24 hour  Intake    600 ml  Output   1050 ml  Net   -450 ml   Blood pressure 140/85, pulse 77, temperature 98.8 F (37.1 C), temperature source Oral, resp. rate 18, height 5' (1.524 m), weight 68.811 kg (151 lb 11.2 oz), SpO2 95.00%.  Physical Exam: General: A x O x3, NAD. CVS: S1-S2 clear, no murmur rubs or gallops Chest: CTAB Abdomen: soft nontender, still hypoactive bowel sounds  Extremities: no c/c/e bilaterally   Lab Results: Basic Metabolic Panel:  Recent Labs Lab 11/18/12 1836 11/19/12 0519  NA 138 141  K 4.2 3.9  CL 99 103  CO2 26 29  GLUCOSE 152* 111*  BUN 14 12  CREATININE 0.75 0.67  CALCIUM 10.5 9.1   Liver Function Tests:  Recent Labs Lab 11/18/12 1836  AST 28  ALT 27  ALKPHOS 81  BILITOT 0.8  PROT 8.3  ALBUMIN 4.5    Recent Labs Lab 11/18/12 1836  LIPASE 25   No results found for this basename: AMMONIA,  in the last 168 hours CBC:  Recent Labs Lab 11/18/12 1836 11/19/12 0519  WBC 9.2 9.7  NEUTROABS 7.6  --   HGB  15.7* 13.5  HCT 46.3* 40.9  MCV 91.0 90.9  PLT 253 275    Studies/Results: Ct Abdomen Pelvis W Contrast  11/18/2012   *RADIOLOGY REPORT*  Clinical Data: Intermittent upper abdominal pain.  Previous bowel perforation in 2006.  CT ABDOMEN AND PELVIS WITH CONTRAST  Technique:  Multidetector CT imaging of the abdomen and pelvis was performed following the standard protocol during bolus administration of intravenous contrast.  Contrast: OMNIPAQUE IOHEXOL 300 MG/ML  SOLN  Comparison: 09/17/2009  Findings: Interval mildly to moderately dilated proximal small bowel loops and normal caliber distal small bowel loops.  The dilatation is most pronounced to the level of a small bowel to small bowel anastomosis in the left upper pelvis.  There is less dilatation distal to the anastomosis and the small bowel then becomes normal in caliber more distally.  Mild diffuse low density of the liver relative to the spleen. Multiple liver cysts, without significant change.  Cholecystectomy clips.  Stable small right renal cyst.  Unremarkable left kidney, adrenal glands, pancreas, spleen and urinary bladder.  Surgically absent uterus.  No adnexal masses or enlarged lymph nodes.  Stable linear scarring at the lung bases.  Lumbar and lower thoracic spine degenerative changes.  IMPRESSION:  1.  Partial small bowel obstruction, beginning at the level of a small  bowel to small bowel anastomosis in the left upper pelvis. 2.  Mild hepatic steatosis.   Original Report Authenticated By: Beckie Salts, M.D.   Dg Abd 2 Views  11/19/2012   *RADIOLOGY REPORT*  Clinical Data: Small bowel obstruction, mid/right abdominal pain, vomiting  ABDOMEN - 2 VIEW  Comparison: CT abdomen pelvis - 11/18/2012  Findings:  Previously ingested enteric contrast is seen extending to the level of the rectum.  There is mild gaseous distension of several loops of small bowel within the left mid hemiabdomen with index loop measuring approximately 3.2 cm in  diameter.  No pneumoperitoneum, pneumatosis or portal venous gas. An enteric tube tip and side port overlying the expected location of the mid body of the stomach.  There is a minimal amount of excreted contrast within the urinary bladder.  Post cholecystectomy.  Limited visualization of the lower thorax suggests bibasilar opacities, favored to represent atelectasis.  Mild scoliotic curvature of the thoracolumbar spine.  IMPRESSION: Findings suggestive of possible very mild persistent partial small bowel obstruction.   Original Report Authenticated By: Tacey Ruiz, MD    Medications: Scheduled Meds: . antiseptic oral rinse  15 mL Mouth Rinse q12n4p  . bisacodyl  10 mg Rectal Daily  . chlorhexidine  15 mL Mouth Rinse BID  . enoxaparin (LOVENOX) injection  30 mg Subcutaneous Q24H  . [START ON 11/21/2012] estradiol  1 Applicatorful Vaginal 3 times weekly  . pantoprazole (PROTONIX) IV  40 mg Intravenous Q24H      LOS: 2 days   RAI,RIPUDEEP M.D. [facility] 11/20/2012, 11:57 AM Pager: [phone]  If 7PM-7AM, please contact night-coverage [email or web address] Password [email or web address]

## 2012-11-21 ENCOUNTER — Inpatient Hospital Stay (HOSPITAL_COMMUNITY): Payer: Medicare HMO

## 2012-11-21 DIAGNOSIS — K219 Gastro-esophageal reflux disease without esophagitis: Secondary | ICD-10-CM

## 2012-11-21 DIAGNOSIS — E785 Hyperlipidemia, unspecified: Secondary | ICD-10-CM

## 2012-11-21 LAB — BASIC METABOLIC PANEL
CO2: 26 mEq/L (ref 19–32)
Chloride: 104 mEq/L (ref 96–112)
Creatinine, Ser: 0.65 mg/dL (ref 0.50–1.10)
GFR calc Af Amer: >90 mL/min (ref >90)
Potassium: 3.9 mEq/L (ref 3.5–5.1)
Sodium: 138 mEq/L (ref 135–145)

## 2012-11-21 LAB — CBC
HCT: 40.6 % (ref 36.0–46.0)
Hemoglobin: 13.3 g/dL (ref 12.0–15.0)
MCV: 91 fL (ref 78.0–100.0)
RBC: 4.46 MIL/uL (ref 3.87–5.11)
RDW: 13.8 % (ref 11.5–15.5)
WBC: 7.3 10*3/uL (ref 4.0–10.5)

## 2012-11-21 NOTE — Progress Notes (Signed)
SBO (small bowel obstruction)  Assessment: Improving SBO resolving  Plan: D/C N/G this am and offer liquids   Subjective: Feels better, no pain, no nausea, passing gas and had a good BM  Objective: Vital signs in last 24 hours: Temp:  [98.4 F (36.9 C)-98.9 F (37.2 C)] 98.5 F (36.9 C) (05/26 0548) Pulse Rate:  [67-80] 73 (05/26 0548) Resp:  [17-18] 18 (05/26 0548) BP: (123-145)/(73-77) 130/74 mmHg (05/26 0548) SpO2:  [98 %-100 %] 98 % (05/26 0548) Last BM Date: 11/21/12  Intake/Output from previous day: 05/25 0701 - 05/26 0700 In: 1575 [I.V.:1575] Out: 2400 [Urine:1500; Emesis/NG output:900]  General appearance: alert, cooperative and no distress Resp: clear to auscultation bilaterally GI: Minimally distended, very soft and completely non tender:BS+  Lab Results:  Results for orders placed during the hospital encounter of 11/18/12 (from the past 24 hour(s))  BASIC METABOLIC PANEL     Status: Abnormal   Collection Time    11/21/12  5:25 AM      Result Value Range   Sodium 138  135 - 145 mEq/L   Potassium 3.9  3.5 - 5.1 mEq/L   Chloride 104  96 - 112 mEq/L   CO2 26  19 - 32 mEq/L   Glucose, Bld 134 (*) 70 - 99 mg/dL   BUN 5 (*) 6 - 23 mg/dL   Creatinine, Ser 2.13  0.50 - 1.10 mg/dL   Calcium 9.3  8.4 - 08.6 mg/dL   GFR calc non Af Amer 90 (*) >90 mL/min   GFR calc Af Amer >90  >90 mL/min  CBC     Status: None   Collection Time    11/21/12  5:25 AM      Result Value Range   WBC 7.3  4.0 - 10.5 K/uL   RBC 4.46  3.87 - 5.11 MIL/uL   Hemoglobin 13.3  12.0 - 15.0 g/dL   HCT 57.8  46.9 - 62.9 %   MCV 91.0  78.0 - 100.0 fL   MCH 29.8  26.0 - 34.0 pg   MCHC 32.8  30.0 - 36.0 g/dL   RDW 52.8  41.3 - 24.4 %   Platelets 266  150 - 400 K/uL     Studies/Results Radiology     MEDS, Scheduled . antiseptic oral rinse  15 mL Mouth Rinse q12n4p  . bisacodyl  10 mg Rectal Daily  . chlorhexidine  15 mL Mouth Rinse BID  . enoxaparin (LOVENOX) injection  30 mg  Subcutaneous Q24H  . estradiol  1 Applicatorful Vaginal 3 times weekly  . pantoprazole (PROTONIX) IV  40 mg Intravenous Q24H       LOS: 3 days    Currie Paris, MD, [facility], [place] [phone]   11/21/2012 10:54 AM

## 2012-11-21 NOTE — ED Provider Notes (Signed)
I saw and evaluated the patient, reviewed the resident's note and I agree with the findings and plan.  Please see my separate note regarding my evaluation of the patient.  Vida Roller, MD 11/21/12 [phone]

## 2012-11-21 NOTE — Progress Notes (Signed)
Patient ID: [name]  female  ZOX:[id number]    DOB: 20-May-1946    DOA: 11/18/2012  PCP: Illene Regulus, MD  Assessment/Plan: Principal Problem:   SBO (small bowel obstruction):  history of multiple abdominal surgeries in the past -  Repeat abdominal x-ray this AM shows passing of the contrast through the colon, passing gas and had good BM  - Appreciate surgery service following the patient, discontinued NGT today, started on clears  Active Problems:   Other and unspecified hyperlipidemia: Hold statins    GERD - on IV PPI  DVT Prophylaxis:  Code Status:  Disposition:    Subjective: Miserable with the NG tube, seen earlier this morning. She did have bowel movement and passing flatus.   Objective: Weight change:   Intake/Output Summary (Last 24 hours) at 11/21/12 1208 Last data filed at 11/21/12 0851  Gross per 24 hour  Intake   1575 ml  Output   2800 ml  Net  -1225 ml   Blood pressure 130/74, pulse 73, temperature 98.5 F (36.9 C), temperature source Oral, resp. rate 18, height 5' (1.524 m), weight 68.811 kg (151 lb 11.2 oz), SpO2 98.00%.  Physical Exam: General: A x O x3, NAD. CVS: S1-S2 clear, no murmur rubs or gallops Chest: CTAB Abdomen: soft nontender, BS+ Extremities: no c/c/e bilaterally   Lab Results: Basic Metabolic Panel:  Recent Labs Lab 11/19/12 0519 11/21/12 0525  NA 141 138  K 3.9 3.9  CL 103 104  CO2 29 26  GLUCOSE 111* 134*  BUN 12 5*  CREATININE 0.67 0.65  CALCIUM 9.1 9.3   Liver Function Tests:  Recent Labs Lab 11/18/12 1836  AST 28  ALT 27  ALKPHOS 81  BILITOT 0.8  PROT 8.3  ALBUMIN 4.5    Recent Labs Lab 11/18/12 1836  LIPASE 25   No results found for this basename: AMMONIA,  in the last 168 hours CBC:  Recent Labs Lab 11/18/12 1836 11/19/12 0519 11/21/12 0525  WBC 9.2 9.7 7.3  NEUTROABS 7.6  --   --   HGB 15.7* 13.5 13.3  HCT 46.3* 40.9 40.6  MCV 91.0 90.9 91.0  PLT 253 275 266     Studies/Results: Ct Abdomen Pelvis W Contrast  11/18/2012   *RADIOLOGY REPORT*  Clinical Data: Intermittent upper abdominal pain.  Previous bowel perforation in 2006.  CT ABDOMEN AND PELVIS WITH CONTRAST  Technique:  Multidetector CT imaging of the abdomen and pelvis was performed following the standard protocol during bolus administration of intravenous contrast.  Contrast: OMNIPAQUE IOHEXOL 300 MG/ML  SOLN  Comparison: 09/17/2009  Findings: Interval mildly to moderately dilated proximal small bowel loops and normal caliber distal small bowel loops.  The dilatation is most pronounced to the level of a small bowel to small bowel anastomosis in the left upper pelvis.  There is less dilatation distal to the anastomosis and the small bowel then becomes normal in caliber more distally.  Mild diffuse low density of the liver relative to the spleen. Multiple liver cysts, without significant change.  Cholecystectomy clips.  Stable small right renal cyst.  Unremarkable left kidney, adrenal glands, pancreas, spleen and urinary bladder.  Surgically absent uterus.  No adnexal masses or enlarged lymph nodes.  Stable linear scarring at the lung bases.  Lumbar and lower thoracic spine degenerative changes.  IMPRESSION:  1.  Partial small bowel obstruction, beginning at the level of a small bowel to small bowel anastomosis in the left upper pelvis. 2.  Mild  hepatic steatosis.   Original Report Authenticated By: Beckie Salts, M.D.   Dg Abd 2 Views  11/19/2012   *RADIOLOGY REPORT*  Clinical Data: Small bowel obstruction, mid/right abdominal pain, vomiting  ABDOMEN - 2 VIEW  Comparison: CT abdomen pelvis - 11/18/2012  Findings:  Previously ingested enteric contrast is seen extending to the level of the rectum.  There is mild gaseous distension of several loops of small bowel within the left mid hemiabdomen with index loop measuring approximately 3.2 cm in diameter.  No pneumoperitoneum, pneumatosis or portal venous  gas. An enteric tube tip and side port overlying the expected location of the mid body of the stomach.  There is a minimal amount of excreted contrast within the urinary bladder.  Post cholecystectomy.  Limited visualization of the lower thorax suggests bibasilar opacities, favored to represent atelectasis.  Mild scoliotic curvature of the thoracolumbar spine.  IMPRESSION: Findings suggestive of possible very mild persistent partial small bowel obstruction.   Original Report Authenticated By: Tacey Ruiz, MD    Medications: Scheduled Meds: . antiseptic oral rinse  15 mL Mouth Rinse q12n4p  . bisacodyl  10 mg Rectal Daily  . chlorhexidine  15 mL Mouth Rinse BID  . enoxaparin (LOVENOX) injection  30 mg Subcutaneous Q24H  . estradiol  1 Applicatorful Vaginal 3 times weekly  . pantoprazole (PROTONIX) IV  40 mg Intravenous Q24H      LOS: 3 days   RAI,RIPUDEEP M.D. [facility] 11/21/2012, 12:08 PM Pager: [phone]  If 7PM-7AM, please contact night-coverage [email or web address] Password [email or web address]

## 2012-11-21 NOTE — Progress Notes (Signed)
UR COMPLETED  

## 2012-11-21 NOTE — Progress Notes (Signed)
I will be returning to work in the AM 5/27. Thank you for the care provided to my patient while I was out.

## 2012-11-22 MED ORDER — ENOXAPARIN SODIUM 40 MG/0.4ML ~~LOC~~ SOLN: 40.0000 mg | SUBCUTANEOUS | Status: DC

## 2012-11-22 NOTE — Progress Notes (Signed)
Patient has done well. Symptoms resolved. Tolerated regular diet. Good BMs.  Plan - d/c/ home Dictated [phone]

## 2012-11-22 NOTE — Progress Notes (Signed)
  Subjective: Pt doing well tol clears well.  + BM yesterday  Objective: Vital signs in last 24 hours: Temp:  [97.9 F (36.6 C)-99 F (37.2 C)] 99 F (37.2 C) (05/27 0539) Pulse Rate:  [63-85] 69 (05/27 0539) Resp:  [18-20] 19 (05/27 0539) BP: (105-134)/(68) 105/68 mmHg (05/27 0539) SpO2:  [98 %-100 %] 98 % (05/27 0539) Last BM Date: 2012-12-03  Intake/Output from previous day: Dec 04, 2022 0701 - 05/27 0700 In: 3173 [P.O.:940; I.V.:2233] Out: 2175 [Urine:2175] Intake/Output this shift:    General appearance: alert and cooperative GI: soft, non-tender; bowel sounds normal; no masses,  no organomegaly  Lab Results:   Recent Labs  12-03-2012 0525  WBC 7.3  HGB 13.3  HCT 40.6  PLT 266   BMET  Recent Labs  12/03/12 0525  NA 138  K 3.9  CL 104  CO2 26  GLUCOSE 134*  BUN 5*  CREATININE 0.65  CALCIUM 9.3   PT/INR No results found for this basename: LABPROT, INR,  in the last 72 hours ABG No results found for this basename: PHART, PCO2, PO2, HCO3,  in the last 72 hours  Studies/Results: Dg Abd 2 Views  12/03/2012   *RADIOLOGY REPORT*  Clinical Data: A small bowel obstruction.  ABDOMEN - 2 VIEW  Comparison: 11/20/2012.  Findings: The NG tube is stable.  There is scattered residual contrast in the colon with slight progression since the prior study. There are a few air-filled but nondilated small bowel loops in the pelvis.  Minimal streaky bibasilar atelectasis is unchanged.  IMPRESSION:  1.  Stable position of the NG tube. 2.  Progression of contrast through the colon. 3.  Persistent scattered air-filled but nondilated small bowel loops.   Original Report Authenticated By: Rudie Meyer, M.D.    Anti-infectives: Anti-infectives   None      Assessment/Plan: s/p * No surgery found * Advance diet as tol No surgery planned Call back if needed  LOS: 4 days    Marigene Ehlers., [facility] 11/22/2012

## 2012-11-22 NOTE — Plan of Care (Signed)
Just talked to Dr Arthur Holms (Patient's PCP) will assume care. I will sign off.   RAI,RIPUDEEP M.D. [facility] 11/22/2012, 7:58 AM  Pager: [phone]

## 2012-11-23 NOTE — Discharge Summary (Signed)
[name], [name]                ACCOUNT NO.:  [id number]  MEDICAL RECORD NO.:  [id number]  LOCATION:  6N25C                        FACILITY:  [facility]  PHYSICIAN:  Rosalyn Gess. Norins, MD  DATE OF BIRTH:  12/20/1945  DATE OF ADMISSION:  11/18/2012 DATE OF DISCHARGE:  11/22/2012                              DISCHARGE SUMMARY   ADMITTING DIAGNOSIS:  Abdominal pain secondary to partial small bowel obstruction.  DISCHARGE DIAGNOSIS:  Abdominal pain secondary to partial small bowel obstruction.  CONSULTANTS:  [facility].  PROCEDURES: 1. CT scan of the abdomen and pelvis, Nov 18, 2012 revealed partial     small bowel obstruction beginning at the level of the small bowel,     the small bowel anastomosis in the left upper pelvis.  Mild hepatic     steatosis. 2. Diagnostic abdomen 2 views Nov 19, 2012 with findings suggestive of     possible mild persistent partial small bowel obstruction. 3. Two view abdomen Nov 20, 2012, with mild prominent loop of small     bowel in the right lower abdomen possibly reflecting partial small     bowel obstruction versus adynamic ileus.  Residual contrast noted     in the colon. 4. Two view of the abdomen Nov 21, 2012 showed stable position of NG     tube.  Progression of contrast through the colon, persistent     scattered, air-filled, but nondilated small bowel loops.  HISTORY OF PRESENT ILLNESS:  [name] is a delightful 67 year old woman with a history of previous cholecystectomy with complications with abscess several years ago.  The patient reports that she had the onset of generalized abdominal pain, sharp and intermittent in nature, rated as 7/10 in severity.  Because of persistent problems as well as nausea and emesis, she came to the [facility] Emergency Department for evaluation.  She was found at that time to have CT evidence of a partial small bowel obstruction.  NG tube was placed to control  nausea and vomiting and the patient was admitted to a regular medical bed.  The patient was seen in consultation by Dr. Derrell Lolling for General Surgery. Please see H and P, past medical history, family history, social history, medications at admission and admission laboratories that were significant for normal metabolic panel except for glucose of 152. Normal liver functions.  White count was 9200 with a hemoglobin of 15.7.  HOSPITAL COURSE:  GI/General Surgery.  The patient admitted with a partial small bowel obstruction.  She did get relief from nausea and vomiting with NG suction.  Imaging studies as noted above.  She was followed by General Surgery, who felt the patient did not have a surgical abdomen.  The patient's symptoms did improve.  Physical exam reveals soft abdomen and returning bowel sounds.  NG was successfully removed on the a.m. of May 26 and the patient was advanced to clear liquids, which she tolerated well.  At this point, the patient had several bowel movements and flatus.  The patient's diet was advanced on the day of discharge to full diet which she tolerated well with no recurrent symptoms.  She had  continued normal bowel movements and flatus and no discomfort or pain.  Surgery saw the patient most recently on the morning of the day of discharge and felt she was stable with no indication for surgical intervention.  They stated they would come back as needed  With the patient's symptoms of partial small bowel obstruction having resolved both on physical exam by ability to take a diet and radiographically, she is now at this time ready for discharge to home.  DISCHARGE EXAMINATION:  VITAL SIGNS:  Temperature was 97.8, blood pressure 140/73, heart rate 75, respirations 19, oxygen saturation 99% on room air. GENERAL APPEARANCE:  A well-nourished mildly overweight African American woman, comfortable in no distress. HEENT:  Conjunctiva and sclerae were clear without  icterus. CARDIOVASCULAR:  Radial pulse 2+.  Her precordium is quiet with regular rate and rhythm. ABDOMEN:  The patient had positive bowel sounds in all 4 quadrants. There is no guarding or rebound.  No tenderness to palpation. NEUROLOGIC:  The patient was intact.  FINAL LABORATORY:  From May 26, sodium 138, potassium 3.9, chloride 104, CO2 of 26, BUN of 5, creatinine 0.65, glucose was 134.  CBC with a white count of 7300, hemoglobin 13.3 g, platelet count 266,000.  The patient's last A1c was February 17, 2012 and was 6.1%.  DISCHARGE MEDICATIONS:  The patient is to resume her home medications including: 1. Tylenol 500 mg every 6 hours as needed. 2. Vitamin D3 400 international units 3 times a day. 3. Estrace 0.1 mg/g apply 2 g vaginally 3 times a week. 4. Multivitamin daily. 5. Omeprazole 20 mg q.a.m. 6. The patient uses an over-the-counter suppository for hemorrhoids as     needed. 7. MiraLAX 17 g daily to maintain bowel habit. 8. Zocor 20 mg daily for cholesterol management. 9. Visine eye drops as needed.  DISPOSITION:  The patient is discharged to home.  No followup is scheduled as the patient is doing well at this point and we will call for any problems or recurrent GI symptoms.     Rosalyn Gess Norins, MD     MEN/MEDQ  D:  11/22/2012  T:  11/23/2012  Job:  [id number]

## 2012-12-01 ENCOUNTER — Other Ambulatory Visit: Payer: Self-pay

## 2012-12-01 ENCOUNTER — Telehealth: Payer: Self-pay

## 2012-12-01 NOTE — Telephone Encounter (Signed)
Pt returning call about Pre Certification from [facility]

## 2012-12-01 NOTE — Telephone Encounter (Signed)
Phone call from patient stating she would like the doctors office to call [phone](Clinical pharmacy review) regarding a program for her to save money on a Nexium 40 mg prescription. I talked with a representative and a tier exception form is going to be faxed. Currently Nexium is a tier 3 medication. With this form being filled and if approved it will go down to a tier 2 costing the pt $15 for a 30 days supply. If she uses the mail order [employer] it will cost $0. Waiting on form to be faxed and once it is faxed back it takes 24-72 hour turn around.  I called and let patient know all of the above information.

## 2012-12-13 NOTE — Telephone Encounter (Signed)
PA for Nexium was faxed 12/05/12 and faxed again 12/13/12. Waiting for insurance response.

## 2012-12-13 NOTE — Telephone Encounter (Signed)
Patient requesting status of PA nexium. She has purchased some OTC nexium which is 22$ for 42 tablets. However if insurance will approve PA then she can get the prescribed nexium for $

## 2012-12-14 NOTE — Telephone Encounter (Signed)
Received a fax stating prior authorization for Nexium has been denied. Patient is aware.

## 2013-04-24 ENCOUNTER — Other Ambulatory Visit: Payer: Self-pay | Admitting: Internal Medicine

## 2013-04-24 DIAGNOSIS — Z1231 Encounter for screening mammogram for malignant neoplasm of breast: Secondary | ICD-10-CM

## 2013-05-10 ENCOUNTER — Ambulatory Visit (INDEPENDENT_AMBULATORY_CARE_PROVIDER_SITE_OTHER): Payer: Medicare HMO | Admitting: Obstetrics and Gynecology

## 2013-05-10 ENCOUNTER — Encounter: Payer: Self-pay | Admitting: Obstetrics and Gynecology

## 2013-05-10 ENCOUNTER — Other Ambulatory Visit (HOSPITAL_COMMUNITY)
Admission: RE | Admit: 2013-05-10 | Discharge: 2013-05-10 | Disposition: A | Discharge status: Home / Self Care | Payer: Medicare HMO | Source: Ambulatory Visit | Attending: Obstetrics and Gynecology | Admitting: Obstetrics and Gynecology

## 2013-05-10 VITALS — BP 127/80 | HR 76 | Ht 60.0 in | Wt 150.8 lb

## 2013-05-10 DIAGNOSIS — Z01419 Encounter for gynecological examination (general) (routine) without abnormal findings: Secondary | ICD-10-CM

## 2013-05-10 DIAGNOSIS — Z23 Encounter for immunization: Secondary | ICD-10-CM

## 2013-05-10 DIAGNOSIS — Z124 Encounter for screening for malignant neoplasm of cervix: Secondary | ICD-10-CM | POA: Insufficient documentation

## 2013-05-10 NOTE — Patient Instructions (Signed)
Preventive Care for Adults, Female A healthy lifestyle and preventive care can promote health and wellness. Preventive health guidelines for women include the following key practices.  A routine yearly physical is a good way to check with your caregiver about your health and preventive screening. It is a chance to share any concerns and updates on your health, and to receive a thorough exam.  Visit your dentist for a routine exam and preventive care every 6 months. Brush your teeth twice a day and floss once a day. Good oral hygiene prevents tooth decay and gum disease.  The frequency of eye exams is based on your age, health, family medical history, use of contact lenses, and other factors. Follow your caregiver's recommendations for frequency of eye exams.  Eat a healthy diet. Foods like vegetables, fruits, whole grains, low-fat dairy products, and lean protein foods contain the nutrients you need without too many calories. Decrease your intake of foods high in solid fats, added sugars, and salt. Eat the right amount of calories for you.Get information about a proper diet from your caregiver, if necessary.  Regular physical exercise is one of the most important things you can do for your health. Most adults should get at least 150 minutes of moderate-intensity exercise (any activity that increases your heart rate and causes you to sweat) each week. In addition, most adults need muscle-strengthening exercises on 2 or more days a week.  Maintain a healthy weight. The body mass index (BMI) is a screening tool to identify possible weight problems. It provides an estimate of body fat based on height and weight. Your caregiver can help determine your BMI, and can help you achieve or maintain a healthy weight.For adults 20 years and older:  A BMI below 18.5 is considered underweight.  A BMI of 18.5 to 24.9 is normal.  A BMI of 25 to 29.9 is considered overweight.  A BMI of 30 and above is  considered obese.  Maintain normal blood lipids and cholesterol levels by exercising and minimizing your intake of saturated fat. Eat a balanced diet with plenty of fruit and vegetables. Blood tests for lipids and cholesterol should begin at age 41 and be repeated every 5 years. If your lipid or cholesterol levels are high, you are over 50, or you are at high risk for heart disease, you may need your cholesterol levels checked more frequently.Ongoing high lipid and cholesterol levels should be treated with medicines if diet and exercise are not effective.  If you smoke, find out from your caregiver how to quit. If you do not use tobacco, do not start.  Lung cancer screening is recommended for adults aged 28 80 years who are at high risk for developing lung cancer because of a history of smoking. Yearly low-dose computed tomography (CT) is recommended for people who have at least a 30-pack-year history of smoking and are a current smoker or have quit within the past 15 years. A pack year of smoking is smoking an average of 1 pack of cigarettes a day for 1 year (for example: 1 pack a day for 30 years or 2 packs a day for 15 years). Yearly screening should continue until the smoker has stopped smoking for at least 15 years. Yearly screening should also be stopped for people who develop a health problem that would prevent them from having lung cancer treatment.  If you are pregnant, do not drink alcohol. If you are breastfeeding, be very cautious about drinking alcohol. If you are  not pregnant and choose to drink alcohol, do not exceed 1 drink per day. One drink is considered to be 12 ounces (355 mL) of beer, 5 ounces (148 mL) of wine, or 1.5 ounces (44 mL) of liquor.  Avoid use of street drugs. Do not share needles with anyone. Ask for help if you need support or instructions about stopping the use of drugs.  High blood pressure causes heart disease and increases the risk of stroke. Your blood pressure  should be checked at least every 1 to 2 years. Ongoing high blood pressure should be treated with medicines if weight loss and exercise are not effective.  If you are 55 to 67 years old, ask your caregiver if you should take aspirin to prevent strokes.  Diabetes screening involves taking a blood sample to check your fasting blood sugar level. This should be done once every 3 years, after age 45, if you are within normal weight and without risk factors for diabetes. Testing should be considered at a younger age or be carried out more frequently if you are overweight and have at least 1 risk factor for diabetes.  Breast cancer screening is essential preventive care for women. You should practice "breast self-awareness." This means understanding the normal appearance and feel of your breasts and may include breast self-examination. Any changes detected, no matter how small, should be reported to a caregiver. Women in their 20s and 30s should have a clinical breast exam (CBE) by a caregiver as part of a regular health exam every 1 to 3 years. After age 40, women should have a CBE every year. Starting at age 40, women should consider having a mammography (breast X-ray test) every year. Women who have a family history of breast cancer should talk to their caregiver about genetic screening. Women at a high risk of breast cancer should talk to their caregivers about having magnetic resonance imaging (MRI) and a mammography every year.  Breast cancer gene (BRCA)-related cancer risk assessment is recommended for women who have family members with BRCA-related cancers. BRCA-related cancers include breast, ovarian, tubal, and peritoneal cancers. Having family members with these cancers may be associated with an increased risk for harmful changes (mutations) in the breast cancer genes BRCA1 and BRCA2. Results of the assessment will determine the need for genetic counseling and BRCA1 and BRCA2 testing.  The Pap test is  a screening test for cervical cancer. A Pap test can show cell changes on the cervix that might become cervical cancer if left untreated. A Pap test is a procedure in which cells are obtained and examined from the lower end of the uterus (cervix).  Women should have a Pap test starting at age 21.  Between ages 21 and 29, Pap tests should be repeated every 2 years.  Beginning at age 30, you should have a Pap test every 3 years as long as the past 3 Pap tests have been normal.  Some women have medical problems that increase the chance of getting cervical cancer. Talk to your caregiver about these problems. It is especially important to talk to your caregiver if a new problem develops soon after your last Pap test. In these cases, your caregiver may recommend more frequent screening and Pap tests.  The above recommendations are the same for women who have or have not gotten the vaccine for human papillomavirus (HPV).  If you had a hysterectomy for a problem that was not cancer or a condition that could lead to cancer, then   you no longer need Pap tests. Even if you no longer need a Pap test, a regular exam is a good idea to make sure no other problems are starting.  If you are between ages 79 and 19, and you have had normal Pap tests going back 10 years, you no longer need Pap tests. Even if you no longer need a Pap test, a regular exam is a good idea to make sure no other problems are starting.  If you have had past treatment for cervical cancer or a condition that could lead to cancer, you need Pap tests and screening for cancer for at least 20 years after your treatment.  If Pap tests have been discontinued, risk factors (such as a new sexual partner) need to be reassessed to determine if screening should be resumed.  The HPV test is an additional test that may be used for cervical cancer screening. The HPV test looks for the virus that can cause the cell changes on the cervix. The cells collected  during the Pap test can be tested for HPV. The HPV test could be used to screen women aged 90 years and older, and should be used in women of any age who have unclear Pap test results. After the age of 9, women should have HPV testing at the same frequency as a Pap test.  Colorectal cancer can be detected and often prevented. Most routine colorectal cancer screening begins at the age of 63 and continues through age 61. However, your caregiver may recommend screening at an earlier age if you have risk factors for colon cancer. On a yearly basis, your caregiver may provide home test kits to check for hidden blood in the stool. Use of a small camera at the end of a tube, to directly examine the colon (sigmoidoscopy or colonoscopy), can detect the earliest forms of colorectal cancer. Talk to your caregiver about this at age 32, when routine screening begins. Direct examination of the colon should be repeated every 5 to 10 years through age 7, unless early forms of pre-cancerous polyps or small growths are found.  Hepatitis C blood testing is recommended for all people born from [birth year] through 1965 and any individual with known risks for hepatitis C.  Practice safe sex. Use condoms and avoid high-risk sexual practices to reduce the spread of sexually transmitted infections (STIs). STIs include gonorrhea, chlamydia, syphilis, trichomonas, herpes, HPV, and human immunodeficiency virus (HIV). Herpes, HIV, and HPV are viral illnesses that have no cure. They can result in disability, cancer, and death. Sexually active women aged 7 and younger should be checked for chlamydia. Older women with new or multiple partners should also be tested for chlamydia. Testing for other STIs is recommended if you are sexually active and at increased risk.  Osteoporosis is a disease in which the bones lose minerals and strength with aging. This can result in serious bone fractures. The risk of osteoporosis can be identified using a  bone density scan. Women ages 30 and over and women at risk for fractures or osteoporosis should discuss screening with their caregivers. Ask your caregiver whether you should take a calcium supplement or vitamin D to reduce the rate of osteoporosis.  Menopause can be associated with physical symptoms and risks. Hormone replacement therapy is available to decrease symptoms and risks. You should talk to your caregiver about whether hormone replacement therapy is right for you.  Use sunscreen. Apply sunscreen liberally and repeatedly throughout the day. You should seek shade  when your shadow is shorter than you. Protect yourself by wearing long sleeves, pants, a wide-brimmed hat, and sunglasses year round, whenever you are outdoors.  Once a month, do a whole body skin exam, using a mirror to look at the skin on your back. Notify your caregiver of new moles, moles that have irregular borders, moles that are larger than a pencil eraser, or moles that have changed in shape or color.  Stay current with required immunizations.  Influenza vaccine. All adults should be immunized every year.  Tetanus, diphtheria, and acellular pertussis (Td, Tdap) vaccine. Pregnant women should receive 1 dose of Tdap vaccine during each pregnancy. The dose should be obtained regardless of the length of time since the last dose. Immunization is preferred during the 27th to 36th week of gestation. An adult who has not previously received Tdap or who does not know her vaccine status should receive 1 dose of Tdap. This initial dose should be followed by tetanus and diphtheria toxoids (Td) booster doses every 10 years. Adults with an unknown or incomplete history of completing a 3-dose immunization series with Td-containing vaccines should begin or complete a primary immunization series including a Tdap dose. Adults should receive a Td booster every 10 years.  Varicella vaccine. An adult without evidence of immunity to varicella  should receive 2 doses or a second dose if she has previously received 1 dose. Pregnant females who do not have evidence of immunity should receive the first dose after pregnancy. This first dose should be obtained before leaving the health care facility. The second dose should be obtained 4 8 weeks after the first dose.  Human papillomavirus (HPV) vaccine. Females aged 13 26 years who have not received the vaccine previously should obtain the 3-dose series. The vaccine is not recommended for use in pregnant females. However, pregnancy testing is not needed before receiving a dose. If a female is found to be pregnant after receiving a dose, no treatment is needed. In that case, the remaining doses should be delayed until after the pregnancy. Immunization is recommended for any person with an immunocompromised condition through the age of 26 years if she did not get any or all doses earlier. During the 3-dose series, the second dose should be obtained 4 8 weeks after the first dose. The third dose should be obtained 24 weeks after the first dose and 16 weeks after the second dose.  Zoster vaccine. One dose is recommended for adults aged 60 years or older unless certain conditions are present.  Measles, mumps, and rubella (MMR) vaccine. Adults born before 1957 generally are considered immune to measles and mumps. Adults born in 1957 or later should have 1 or more doses of MMR vaccine unless there is a contraindication to the vaccine or there is laboratory evidence of immunity to each of the three diseases. A routine second dose of MMR vaccine should be obtained at least 28 days after the first dose for students attending postsecondary schools, health care workers, or international travelers. People who received inactivated measles vaccine or an unknown type of measles vaccine during 1963 1967 should receive 2 doses of MMR vaccine. People who received inactivated mumps vaccine or an unknown type of mumps vaccine  before 1979 and are at high risk for mumps infection should consider immunization with 2 doses of MMR vaccine. For females of childbearing age, rubella immunity should be determined. If there is no evidence of immunity, females who are not pregnant should be vaccinated. If there   is no evidence of immunity, females who are pregnant should delay immunization until after pregnancy. Unvaccinated health care workers born before 1957 who lack laboratory evidence of measles, mumps, or rubella immunity or laboratory confirmation of disease should consider measles and mumps immunization with 2 doses of MMR vaccine or rubella immunization with 1 dose of MMR vaccine.  Pneumococcal 13-valent conjugate (PCV13) vaccine. When indicated, a person who is uncertain of her immunization history and has no record of immunization should receive the PCV13 vaccine. An adult aged 19 years or older who has certain medical conditions and has not been previously immunized should receive 1 dose of PCV13 vaccine. This PCV13 should be followed with a dose of pneumococcal polysaccharide (PPSV23) vaccine. The PPSV23 vaccine dose should be obtained at least 8 weeks after the dose of PCV13 vaccine. An adult aged 19 years or older who has certain medical conditions and previously received 1 or more doses of PPSV23 vaccine should receive 1 dose of PCV13. The PCV13 vaccine dose should be obtained 1 or more years after the last PPSV23 vaccine dose.  Pneumococcal polysaccharide (PPSV23) vaccine. When PCV13 is also indicated, PCV13 should be obtained first. All adults aged 65 years and older should be immunized. An adult younger than age 65 years who has certain medical conditions should be immunized. Any person who resides in a nursing home or long-term care facility should be immunized. An adult smoker should be immunized. People with an immunocompromised condition and certain other conditions should receive both PCV13 and PPSV23 vaccines. People  with human immunodeficiency virus (HIV) infection should be immunized as soon as possible after diagnosis. Immunization during chemotherapy or radiation therapy should be avoided. Routine use of PPSV23 vaccine is not recommended for American Indians, [place] Natives, or people younger than 65 years unless there are medical conditions that require PPSV23 vaccine. When indicated, people who have unknown immunization and have no record of immunization should receive PPSV23 vaccine. One-time revaccination 5 years after the first dose of PPSV23 is recommended for people aged 19 64 years who have chronic kidney failure, nephrotic syndrome, asplenia, or immunocompromised conditions. People who received 1 2 doses of PPSV23 before age 65 years should receive another dose of PPSV23 vaccine at age 65 years or later if at least 5 years have passed since the previous dose. Doses of PPSV23 are not needed for people immunized with PPSV23 at or after age 65 years.  Meningococcal vaccine. Adults with asplenia or persistent complement component deficiencies should receive 2 doses of quadrivalent meningococcal conjugate (MenACWY-D) vaccine. The doses should be obtained at least 2 months apart. Microbiologists working with certain meningococcal bacteria, military recruits, people at risk during an outbreak, and people who travel to or live in countries with a high rate of meningitis should be immunized. A first-year college student up through age 21 years who is living in a residence hall should receive a dose if she did not receive a dose on or after her 16th birthday. Adults who have certain high-risk conditions should receive one or more doses of vaccine.  Hepatitis A vaccine. Adults who wish to be protected from this disease, have certain high-risk conditions, work with hepatitis A-infected animals, work in hepatitis A research labs, or travel to or work in countries with a high rate of hepatitis A should be immunized. Adults  who were previously unvaccinated and who anticipate close contact with an international adoptee during the first 60 days after arrival in the [place] from a country   with a high rate of hepatitis A should be immunized.  Hepatitis B vaccine. Adults who wish to be protected from this disease, have certain high-risk conditions, may be exposed to blood or other infectious body fluids, are household contacts or sex partners of hepatitis B positive people, are clients or workers in certain care facilities, or travel to or work in countries with a high rate of hepatitis B should be immunized.  Haemophilus influenzae type b (Hib) vaccine. A previously unvaccinated person with asplenia or sickle cell disease or having a scheduled splenectomy should receive 1 dose of Hib vaccine. Regardless of previous immunization, a recipient of a hematopoietic stem cell transplant should receive a 3-dose series 6 12 months after her successful transplant. Hib vaccine is not recommended for adults with HIV infection. Preventive Services / Frequency AAges 65 and over  Blood pressure check.** / Every 1 to 2 years.  Lipid and cholesterol check.** / Every 5 years beginning at age 62.  Lung cancer screening. / Every year if you are aged 75 80 years and have a 30-pack-year history of smoking and currently smoke or have quit within the past 15 years. Yearly screening is stopped once you have quit smoking for at least 15 years or develop a health problem that would prevent you from having lung cancer treatment.  Clinical breast exam.** / Every year after age 35.  BRCA-related cancer risk assessment.** / For women who have family members with a BRCA-related cancer (breast, ovarian, tubal, or peritoneal cancers).  Mammogram.** / Every year beginning at age 43 and continuing for as long as you are in good health. Consult with your caregiver.  Pap test.** / Every 3 years starting at age 81 through age 72 or 41 with a 3  consecutive normal Pap tests. Testing can be stopped between 65 and 70 with 3 consecutive normal Pap tests and no abnormal Pap or HPV tests in the past 10 years.  HPV screening.** / Every 3 years from ages 81 through ages 41 or 62 with a history of 3 consecutive normal Pap tests. Testing can be stopped between 65 and 70 with 3 consecutive normal Pap tests and no abnormal Pap or HPV tests in the past 10 years.  Fecal occult blood test (FOBT) of stool. / Every year beginning at age 59 and continuing until age 11. You may not need to do this test if you get a colonoscopy every 10 years.  Flexible sigmoidoscopy or colonoscopy.** / Every 5 years for a flexible sigmoidoscopy or every 10 years for a colonoscopy beginning at age 78 and continuing until age 33.  Hepatitis C blood test.** / For all people born from [birth year] through 1965 and any individual with known risks for hepatitis C.  Osteoporosis screening.** / A one-time screening for women ages 68 and over and women at risk for fractures or osteoporosis.  Skin self-exam. / Monthly.  Influenza vaccine. / Every year.  Tetanus, diphtheria, and acellular pertussis (Tdap/Td) vaccine.** / 1 dose of Td every 10 years.  Varicella vaccine.** / Consult your caregiver.  Zoster vaccine.** / 1 dose for adults aged 78 years or older.  Pneumococcal 13-valent conjugate (PCV13) vaccine.** / Consult your caregiver.  Pneumococcal polysaccharide (PPSV23) vaccine.** / 1 dose for all adults aged 37 years and older.  Meningococcal vaccine.** / Consult your caregiver.  Hepatitis A vaccine.** / Consult your caregiver.  Hepatitis B vaccine.** / Consult your caregiver.  Haemophilus influenzae type b (Hib) vaccine.** / Consult your caregiver. **  Family history and personal history of risk and conditions may change your caregiver's recommendations. Document Released: 08/11/2001 Document Revised: 10/10/2012 Document Reviewed: 11/10/2010 [facility] Patient Information  2014 [place], [place].

## 2013-05-10 NOTE — Progress Notes (Signed)
Subjective:     [name] is a 67 y.o. female with BMI 29 who is here for a comprehensive physical exam. The patient reports some vaginal pruritis over the past few days.  History   Social History  . Marital Status: Married    Spouse Name: N/A    Number of Children: N/A  . Years of Education: N/A   Occupational History  . Not on file.   Social History Main Topics  . Smoking status: Never Smoker   . Smokeless tobacco: Never Used  . Alcohol Use: No  . Drug Use: No  . Sexual Activity: Not on file   Other Topics Concern  . Not on file   Social History Narrative   Married '68. 2 son - '69, '70; 1 son - '71: 2 grandchildren. 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   Health Maintenance  Topic Date Due  . Zostavax  10/04/2005  . Influenza Vaccine  01/27/2013  . Mammogram  05/25/2014  . Tetanus/tdap  11/18/2020  . Colonoscopy  03/14/2022  . Pneumococcal Polysaccharide Vaccine Age 3 And Over  Completed   Past Medical History  Diagnosis Date  . GERD (gastroesophageal reflux disease)   . Hyperlipidemia   . History of cervical dysplasia     CIN II  . H/O hiatal hernia   . Female cystocele   . History of peritonitis     2006--  S/P PERFORATED BOWEL INJURY DURING SURGERY  . Arthritis     KNEES  . CTS (carpal tunnel syndrome)     BILATERAL LEFT > RIGHT (OCC. HAND NUMBNESS)   Past Surgical History  Procedure Laterality Date  . Nasal septum surgery  70's    REPAIR DEVIATED SEPTUM  . Laparoscopic cholecystectomy  2006    W/ PERFERATED BOWEL (INJURY)  . Tubal ligation  1980'S  . Abdominal hysterectomy  1991    W/ BILATERAL SALPINGOOPHORECTOMY  . Vaginal prolapse repair N/A 08/15/2012    Procedure: VAGINAL VAULT SUSPENSION;  Surgeon: Kathi Ludwig, MD;  Location: [facility];  Service: Urology;  Laterality: N/A;  ANTERIOR VAULT REPAIR WITH XENFORE   Family History  Problem Relation Age of Onset  . Coronary artery disease Mother   .  Other Mother     cva  . Hypertension Mother   . Heart disease Mother   . Other Father     CVA  . Stroke Father   . Diabetes Other   . Coronary artery disease Other   . Hypertension Other   . Stroke Other   . Cancer Other     breast  . Cancer Sister     breast  . Cancer Brother     prancreatic  . Cancer Sister     breast  . Colon cancer Neg Hx    History  Substance Use Topics  . Smoking status: Never Smoker   . Smokeless tobacco: Never Used  . Alcohol Use: No       Review of Systems A comprehensive review of systems was negative.   Objective:      GENERAL: Well-developed, well-nourished female in no acute distress.  HEENT: Normocephalic, atraumatic. Sclerae anicteric.  NECK: Supple. Normal thyroid.  LUNGS: Clear to auscultation bilaterally.  HEART: Regular rate and rhythm. BREASTS: Symmetric in size. No palpable masses or lymphadenopathy, skin changes, or nipple drainage. ABDOMEN: Soft, nontender, nondistended. No organomegaly. PELVIC: Normal external female genitalia. Vagina is pink and rugated.  No adnexal mass or tenderness. EXTREMITIES: No  cyanosis, clubbing, or edema, 2+ distal pulses.    Assessment:    Healthy female exam.      Plan:    Patient with history of CIN II- pap smear collected Wet prep collected Mammogram already scheduled in decelber Patient advised to perform monthly breast and vulva exams See After Visit Summary for Counseling Recommendations

## 2013-05-11 LAB — WET PREP, GENITAL
Clue Cells Wet Prep HPF POC: NONE SEEN
Trich, Wet Prep: NONE SEEN
WBC, Wet Prep HPF POC: NONE SEEN

## 2013-05-29 ENCOUNTER — Ambulatory Visit (HOSPITAL_COMMUNITY): Payer: Medicare HMO | Attending: Internal Medicine

## 2013-06-02 ENCOUNTER — Ambulatory Visit (HOSPITAL_COMMUNITY)
Admission: RE | Admit: 2013-06-02 | Discharge: 2013-06-02 | Disposition: A | Discharge status: Home / Self Care | Payer: Medicare HMO | Source: Ambulatory Visit | Attending: Internal Medicine | Admitting: Internal Medicine

## 2013-06-02 DIAGNOSIS — Z1231 Encounter for screening mammogram for malignant neoplasm of breast: Secondary | ICD-10-CM | POA: Insufficient documentation

## 2013-06-06 ENCOUNTER — Other Ambulatory Visit: Payer: Self-pay | Admitting: Internal Medicine

## 2013-06-06 DIAGNOSIS — R928 Other abnormal and inconclusive findings on diagnostic imaging of breast: Secondary | ICD-10-CM

## 2013-06-07 ENCOUNTER — Other Ambulatory Visit: Payer: Self-pay | Admitting: Internal Medicine

## 2013-06-09 ENCOUNTER — Other Ambulatory Visit: Payer: Self-pay | Admitting: Internal Medicine

## 2013-06-09 ENCOUNTER — Ambulatory Visit
Admission: RE | Admit: 2013-06-09 | Discharge: 2013-06-09 | Disposition: A | Discharge status: Home / Self Care | Payer: Medicare HMO | Source: Ambulatory Visit | Attending: Internal Medicine | Admitting: Internal Medicine

## 2013-06-09 DIAGNOSIS — R921 Mammographic calcification found on diagnostic imaging of breast: Secondary | ICD-10-CM

## 2013-06-09 DIAGNOSIS — R928 Other abnormal and inconclusive findings on diagnostic imaging of breast: Secondary | ICD-10-CM

## 2013-06-13 ENCOUNTER — Ambulatory Visit
Admission: RE | Admit: 2013-06-13 | Discharge: 2013-06-13 | Disposition: A | Discharge status: Home / Self Care | Payer: Medicare HMO | Source: Ambulatory Visit | Attending: Internal Medicine | Admitting: Internal Medicine

## 2013-06-13 DIAGNOSIS — R921 Mammographic calcification found on diagnostic imaging of breast: Secondary | ICD-10-CM

## 2013-07-14 ENCOUNTER — Telehealth: Payer: Self-pay | Admitting: Internal Medicine

## 2013-07-14 NOTE — Telephone Encounter (Signed)
Left massage for pt to call back.  °

## 2013-07-14 NOTE — Telephone Encounter (Signed)
Needs ov prior to referral for mammography - can add on Monday

## 2013-07-14 NOTE — Telephone Encounter (Signed)
Pt request referral to go to Dr. Owens Shark at [facility] due to pain in right breast. Pt has has [employer] for insurance.

## 2013-07-17 ENCOUNTER — Ambulatory Visit (INDEPENDENT_AMBULATORY_CARE_PROVIDER_SITE_OTHER): Payer: Medicare HMO | Admitting: Internal Medicine

## 2013-07-17 ENCOUNTER — Encounter: Payer: Self-pay | Admitting: Internal Medicine

## 2013-07-17 VITALS — BP 140/90 | HR 76 | Temp 98.4°F | Wt 147.0 lb

## 2013-07-17 DIAGNOSIS — N644 Mastodynia: Secondary | ICD-10-CM

## 2013-07-17 NOTE — Patient Instructions (Signed)
Breast exam - site of biopsy right breast at the 0900 position is visible and a very slight indentation. No erythema, no drainage, no mass, no particular tenderness.  Assessment - post procedure tenderness with no indication on exam of infection or any other problem.

## 2013-07-17 NOTE — Progress Notes (Signed)
Pre visit review using our clinic review tool, if applicable. No additional management support is needed unless otherwise documented below in the visit note. 

## 2013-07-17 NOTE — Progress Notes (Signed)
   Subjective:    Patient ID: [name], female    DOB: 1945-09-15, 68 y.o.   MRN: [id number]  HPI Mrs. Gagan had needle biospy of breast several days ago. She had some pain at the biopsy site and presents for evaluation. The biopsy was benign  PMH, FamHx and SocHx reviewed for any changes and relevance.  Current Outpatient Prescriptions on File Prior to Visit  Medication Sig Dispense Refill  . acetaminophen (TYLENOL) 500 MG tablet Take 500-750 mg by mouth every 6 (six) hours as needed for pain.      . Cholecalciferol (VITAMIN D3) 400 UNITS CAPS Take 1 capsule by mouth 3 (three) times a week.       . estradiol (ESTRACE) 0.1 MG/GM vaginal cream Place 2 g vaginally 3 (three) times a week.      . Multiple Vitamin (MULTIVITAMIN) tablet Take 1 tablet by mouth every other day.       Marland Kitchen OVER THE COUNTER MEDICATION Place 1 suppository rectally daily as needed (for hemmorhoids).      . polyethylene glycol (MIRALAX / GLYCOLAX) packet Take 17 g by mouth daily as needed.       . simvastatin (ZOCOR) 20 MG tablet TAKE 1 TABLET BY MOUTH AT BEDTIME  30 tablet  6  . tetrahydrozoline (VISINE) 0.05 % ophthalmic solution Place 2 drops into both eyes daily as needed (for dry eyes).       No current facility-administered medications on file prior to visit.      Review of Systems System review is negative for any constitutional, cardiac, pulmonary, GI or neuro symptoms or complaints other than as described in the HPI.     Objective:   Physical Exam Filed Vitals:   07/17/13 1628  BP: 140/90  Pulse: 76  Temp: 98.4 F (36.9 C)   Gen'l - WNWD woman Breast exam - site of biopsy right breast at the 0900 position is visible and a very slight indentation. No erythema, no drainage, no mass, no particular tenderness       Assessment & Plan:  Breast pain - negative exam. Most likely post procedure discomfort.  Plan  Reassurance.

## 2013-07-17 NOTE — Telephone Encounter (Signed)
Pt has an appt 07/17/13.

## 2013-11-16 ENCOUNTER — Other Ambulatory Visit: Payer: Self-pay | Admitting: Internal Medicine

## 2013-11-16 ENCOUNTER — Other Ambulatory Visit: Payer: Self-pay | Admitting: Obstetrics and Gynecology

## 2013-11-16 DIAGNOSIS — R921 Mammographic calcification found on diagnostic imaging of breast: Secondary | ICD-10-CM

## 2013-12-12 ENCOUNTER — Encounter (INDEPENDENT_AMBULATORY_CARE_PROVIDER_SITE_OTHER): Payer: Self-pay

## 2013-12-12 ENCOUNTER — Ambulatory Visit
Admission: RE | Admit: 2013-12-12 | Discharge: 2013-12-12 | Disposition: A | Discharge status: Home / Self Care | Payer: Medicare HMO | Source: Ambulatory Visit | Attending: Obstetrics and Gynecology | Admitting: Obstetrics and Gynecology

## 2013-12-12 DIAGNOSIS — R921 Mammographic calcification found on diagnostic imaging of breast: Secondary | ICD-10-CM

## 2014-01-18 ENCOUNTER — Other Ambulatory Visit: Payer: Self-pay

## 2014-01-18 DIAGNOSIS — E785 Hyperlipidemia, unspecified: Secondary | ICD-10-CM

## 2014-01-18 MED ORDER — SIMVASTATIN 20 MG PO TABS: ORAL_TABLET | ORAL | Status: DC

## 2014-01-18 NOTE — Telephone Encounter (Signed)
Patient aware to come in for labs. Orders have been placed.

## 2014-01-18 NOTE — Telephone Encounter (Signed)
Request for statin refill received  Last lipids, liver function test and CK on record was: 01/2012 #30  Schedule Lipids, LFTs, TSH ,CK

## 2014-01-22 ENCOUNTER — Other Ambulatory Visit (INDEPENDENT_AMBULATORY_CARE_PROVIDER_SITE_OTHER): Payer: Medicare HMO

## 2014-01-22 DIAGNOSIS — E785 Hyperlipidemia, unspecified: Secondary | ICD-10-CM

## 2014-01-22 LAB — HEPATIC FUNCTION PANEL
ALT: 31 U/L (ref 0–35)
AST: 28 U/L (ref 0–37)
Albumin: 4.3 g/dL (ref 3.5–5.2)
Alkaline Phosphatase: 70 U/L (ref 39–117)
Bilirubin, Direct: 0.1 mg/dL (ref 0.0–0.3)
TOTAL PROTEIN: 7.3 g/dL (ref 6.0–8.3)
Total Bilirubin: 1.1 mg/dL (ref 0.2–1.2)

## 2014-01-22 LAB — LIPID PANEL
CHOLESTEROL: 201 mg/dL — AB (ref 0–200)
HDL: 56 mg/dL (ref >39.00)
LDL CALC: 115 mg/dL — AB (ref 0–99)
NONHDL: 145
Total CHOL/HDL Ratio: 4
Triglycerides: 152 mg/dL — ABNORMAL HIGH (ref 0.0–149.0)
VLDL: 30.4 mg/dL (ref 0.0–40.0)

## 2014-01-22 LAB — TSH: TSH: 1.87 u[IU]/mL (ref 0.35–4.50)

## 2014-01-22 LAB — CK: Total CK: 528 U/L — ABNORMAL HIGH (ref 7–177)

## 2014-02-02 ENCOUNTER — Ambulatory Visit (INDEPENDENT_AMBULATORY_CARE_PROVIDER_SITE_OTHER): Payer: Medicare HMO | Admitting: Internal Medicine

## 2014-02-02 ENCOUNTER — Encounter: Payer: Self-pay | Admitting: Internal Medicine

## 2014-02-02 VITALS — BP 125/78 | HR 71 | Temp 98.0°F | Wt 152.2 lb

## 2014-02-02 DIAGNOSIS — K219 Gastro-esophageal reflux disease without esophagitis: Secondary | ICD-10-CM

## 2014-02-02 DIAGNOSIS — E785 Hyperlipidemia, unspecified: Secondary | ICD-10-CM

## 2014-02-02 DIAGNOSIS — R748 Abnormal levels of other serum enzymes: Secondary | ICD-10-CM | POA: Insufficient documentation

## 2014-02-02 NOTE — Progress Notes (Signed)
Pre visit review using our clinic review tool, if applicable. No additional management support is needed unless otherwise documented below in the visit note. 

## 2014-02-02 NOTE — Patient Instructions (Signed)
Please review Dr Nunzio Cory book Eat, [place] for best  dietary cholesterol information.  Cardiovascular exercise, this can be as simple a program as walking, is recommended 30-45 minutes 3-4 times per week. If you're not exercising you should take 6-8 weeks to build up to this level.  Please  schedule fasting Labs : NMR Lipoprofile Lipid Panel & CK after 10 weeks of dietary changes to optimally assess LDL risk.   Reflux of gastric acid may be asymptomatic as this may occur mainly during sleep.The triggers for reflux  include stress; the "aspirin family" ; alcohol; peppermint; and caffeine (coffee, tea, cola, and chocolate). The aspirin family would include aspirin and the nonsteroidal agents such as ibuprofen &  Naproxen. Tylenol would not cause reflux. If having symptoms ; food & drink should be avoided for @ least 2 hours before going to bed.

## 2014-02-02 NOTE — Assessment & Plan Note (Signed)
Warning signs discussed

## 2014-02-02 NOTE — Assessment & Plan Note (Signed)
Discontinue simvastatin  Recheck NMR LipoProfile with CK in 10 weeks

## 2014-02-02 NOTE — Progress Notes (Signed)
   Subjective:    Patient ID: [name], female    DOB: [birth year], 68 y.o.   MRN: [id number]  HPI  She is here to evaluate her dyslipidemia and GERD.  Her most recent LDL was 115 on 20 mg of simvastatin; her CK was elevated at 528. HDL is protective at 56.  She is on modified heart healthy diet. She does not exercise  She does have occasional palpitations. She has some discomfort in her thighs intermittently.  Her mother had coronary disease with stroke at 49 and possibly MI.  Her GERD is essentially asymptomatic at this time on over-the-counter Nexium daily. She has a history of esophageal  stricture with dilation on 2 occasions by Dr. Sharlett Iles.  She has tea occasionally and chocolate. She does not drink alcohol or coffee. She avoids nonsteroidals. She does not ingest peppermint.    Review of Systems   Chest pain, palpitations, tachycardia, exertional dyspnea, paroxysmal nocturnal dyspnea, claudication or edema are absent.  Unexplained weight loss, abdominal pain, significant dyspepsia, dysphagia, melena, rectal bleeding, or persistently small caliber stools are denied.     Objective:   Physical Exam  Significant or distinguishing  findings on physical exam include: Accentuation of the upper thoracic curvature. Medial deviation of the fifth digits bilaterally.  Appears healthy and well-nourished & in no acute distress  No carotid bruits are present.No neck pain distention present at 10 - 15 degrees. Thyroid normal to palpation  Heart rhythm and rate are normal with no gallop or murmur  Chest is clear with no increased work of breathing  There is no evidence of aortic aneurysm or renal artery bruits  Abdomen soft with no organomegaly or masses. No HJR  No clubbing, cyanosis or edema present.  Pedal pulses are intact   No ischemic skin changes are present . Fingernails healthy   Alert and oriented. Strength, tone, DTRs reflexes normal        Assessment &  Plan:  See Current Assessment & Plan in Problem List under specific Diagnosis

## 2014-04-11 ENCOUNTER — Ambulatory Visit (INDEPENDENT_AMBULATORY_CARE_PROVIDER_SITE_OTHER): Payer: Medicare HMO | Admitting: Internal Medicine

## 2014-04-11 ENCOUNTER — Encounter: Payer: Self-pay | Admitting: Internal Medicine

## 2014-04-11 VITALS — BP 130/92 | HR 74 | Temp 99.1°F | Resp 14 | Wt 149.2 lb

## 2014-04-11 DIAGNOSIS — J069 Acute upper respiratory infection, unspecified: Secondary | ICD-10-CM

## 2014-04-11 DIAGNOSIS — J029 Acute pharyngitis, unspecified: Secondary | ICD-10-CM

## 2014-04-11 MED ORDER — AZITHROMYCIN 250 MG PO TABS: ORAL_TABLET | ORAL | Status: DC

## 2014-04-11 MED ORDER — BENZONATATE 200 MG PO CAPS: 200.0000 mg | ORAL_CAPSULE | Freq: Three times a day (TID) | ORAL | Status: DC | PRN

## 2014-04-11 NOTE — Patient Instructions (Signed)
Plain Mucinex (NOT D) for thick secretions ;force NON dairy fluids .   Nasal cleansing in the shower as discussed with lather of mild shampoo.After 10 seconds wash off lather while  exhaling through nostrils. Make sure that all residual soap is removed to prevent irritation.  Flonase OR Nasacort AQ 1 spray in each nostril twice a day as needed. Use the "crossover" technique into opposite nostril spraying toward opposite ear @ 45 degree angle, not straight up into nostril.  Use a Neti pot daily only  as needed for significant sinus congestion; going from open side to congested side . Plain Allegra (NOT D )  160 daily , Loratidine 10 mg , OR Zyrtec 10 mg @ bedtime  as needed for itchy eyes & sneezing.  Fill the  prescription for Zithromax it there is not dramatic improvement in the next 48-72 hours.

## 2014-04-11 NOTE — Progress Notes (Signed)
   Subjective:    Patient ID: [name], female    DOB: May 26, 1946, 68 y.o.   MRN: [id number]  HPI   Symptoms began 04/07/14 as sore throat in the context of a "cold". She also had some sneezing  She describes postnasal drainage with cough which has been productive of green sputum.  She does have a fullness and pressure in her head without frontal headache or facial sinus pain. She denies nasal purulence.  OTC cough syrup, Cold & Sinus with some benefit.  Her husband had been ill since 03/28/14.    Review of Systems  She denied fever, chills, or sweats  The cough is not associated with shortness of breath or wheezing       Objective:   Physical Exam   Positive or pertinent findings include:  There is scarring of the right tympanic membrane Nares are somewhat dry and erythematous There is marked accentuation of the upper thoracic curvature.  General appearance:good health ;well nourished; no acute distress or increased work of breathing is present.  No  lymphadenopathy about the head, neck, or axilla noted.   Eyes: No conjunctival inflammation or lid edema is present. There is no scleral icterus.  Ears:  External ear exam shows no significant lesions or deformities.  Otoscopic examination reveals clear canals, tympanic membranes are intact bilaterally without bulging, retraction, inflammation or discharge.  Nose:  External nasal examination shows no deformity or inflammation.  No septal dislocation or deviation.No obstruction to airflow.   Oral exam: Dental hygiene is good; lips and gums are healthy appearing.There is no oropharyngeal erythema or exudate noted.   Neck:  No deformities, thyromegaly, masses, or tenderness noted.   Supple but decreased range of motion without pain.   Heart:  Normal rate and regular rhythm. S1 and S2 normal without gallop, murmur, click, rub or other extra sounds.   Lungs:Chest clear to auscultation; no wheezes, rhonchi,rales ,or rubs  present.No increased work of breathing.    Extremities:  No cyanosis, edema, or clubbing  noted    Skin: Warm & dry w/o jaundice or tenting.         Assessment & Plan:  #1 viral URI #2 pharyngitis See orders & AVS

## 2014-04-11 NOTE — Progress Notes (Signed)
Pre visit review using our clinic review tool, if applicable. No additional management support is needed unless otherwise documented below in the visit note. 

## 2014-05-07 ENCOUNTER — Telehealth: Payer: Self-pay | Admitting: Internal Medicine

## 2014-05-07 NOTE — Telephone Encounter (Signed)
Patient states Dr. Linna Darner was wanting her to come back for labs.  There are not any labs entered into the system.  Please advise.

## 2014-05-07 NOTE — Telephone Encounter (Signed)
Orders in EMR for fasting EMR & CK

## 2014-05-11 ENCOUNTER — Telehealth: Payer: Self-pay | Admitting: Internal Medicine

## 2014-05-11 NOTE — Telephone Encounter (Signed)
Notified pt md has already enter lab orders...[name]

## 2014-05-11 NOTE — Telephone Encounter (Signed)
Pt called in wanted labs put in per Avs from 02/12/2013 visit   Please schedule fasting Labs : NMR Lipoprofile Lipid Panel & CK after 10 weeks of dietary changes to optimally assess LDL risk

## 2014-05-22 ENCOUNTER — Other Ambulatory Visit (INDEPENDENT_AMBULATORY_CARE_PROVIDER_SITE_OTHER): Payer: Medicare HMO

## 2014-05-22 ENCOUNTER — Telehealth: Payer: Self-pay | Admitting: Internal Medicine

## 2014-05-22 DIAGNOSIS — E785 Hyperlipidemia, unspecified: Secondary | ICD-10-CM

## 2014-05-22 DIAGNOSIS — R748 Abnormal levels of other serum enzymes: Secondary | ICD-10-CM

## 2014-05-22 LAB — CK: CK TOTAL: 277 U/L — AB (ref 7–177)

## 2014-05-22 NOTE — Telephone Encounter (Signed)
Patient is in lobby.  She states Dr. Linna Darner told her to go to the lab for a cholesterol check.  She went to lab and there are no orders entered.  Please advise.

## 2014-05-22 NOTE — Telephone Encounter (Signed)
Order placed

## 2014-05-24 LAB — NMR LIPOPROFILE WITH LIPIDS
CHOLESTEROL, TOTAL: 272 mg/dL — AB (ref 100–199)
HDL Particle Number: 37.6 umol/L (ref >=30.5)
HDL SIZE: 9 nm — AB (ref >=9.2)
HDL-C: 67 mg/dL (ref >39)
LARGE VLDL-P: 2 nmol/L (ref <=2.7)
LDL (calc): 183 mg/dL — ABNORMAL HIGH (ref 0–99)
LDL Particle Number: 2086 nmol/L — ABNORMAL HIGH (ref <1000)
LDL Size: 21.5 nm (ref >=20.8)
LP-IR Score: 39 (ref <=45)
Large HDL-P: 7.6 umol/L (ref >=4.8)
Small LDL Particle Number: 271 nmol/L (ref <=527)
Triglycerides: 111 mg/dL (ref 0–149)
VLDL Size: 49.7 nm — ABNORMAL HIGH (ref <=46.6)

## 2014-05-28 ENCOUNTER — Other Ambulatory Visit: Payer: Self-pay

## 2014-05-28 DIAGNOSIS — Z1231 Encounter for screening mammogram for malignant neoplasm of breast: Secondary | ICD-10-CM

## 2014-06-08 ENCOUNTER — Ambulatory Visit (INDEPENDENT_AMBULATORY_CARE_PROVIDER_SITE_OTHER): Payer: Medicare HMO

## 2014-06-08 ENCOUNTER — Ambulatory Visit (INDEPENDENT_AMBULATORY_CARE_PROVIDER_SITE_OTHER): Payer: Medicare HMO | Admitting: Internal Medicine

## 2014-06-08 ENCOUNTER — Encounter: Payer: Self-pay | Admitting: Internal Medicine

## 2014-06-08 VITALS — BP 132/82 | HR 82 | Temp 98.6°F | Resp 14 | Wt 147.0 lb

## 2014-06-08 DIAGNOSIS — Z23 Encounter for immunization: Secondary | ICD-10-CM

## 2014-06-08 DIAGNOSIS — E785 Hyperlipidemia, unspecified: Secondary | ICD-10-CM

## 2014-06-08 NOTE — Progress Notes (Signed)
Pre visit review using our clinic review tool, if applicable. No additional management support is needed unless otherwise documented below in the visit note. 

## 2014-06-08 NOTE — Patient Instructions (Signed)
Please follow a Mediaterranean type diet  (many good cook books readily available) or review Dr Nunzio Cory book Eat, [place] for best  dietary cholesterol information & options.  Cardiovascular exercise, this can be as simple a program as walking, is recommended 30-45 minutes 3-4 times per week. If you're not exercising you should take 6-8 weeks to build up to this level.  Please take enteric-coated aspirin 81 mg daily with breakfast. An advanced cholesterol panel (NMR Lipoprofile Lipid Panel) after 4 months of nutritional & exercise changes is best way to optimally assess long LDL risk.  If you cannot make significant changes in exercise and nutrition; statin medication at low dose (crestor OR Livalo) OR Zetia would be the best therapeutic option to reduce your long term risk which is elevated > 60% with LDL of 183.

## 2014-06-08 NOTE — Progress Notes (Signed)
   Subjective:    Patient ID: [name], female    DOB: [birth year], 68 y.o.   MRN: [id number]  HPI She is here to reassess her lipid risk  Her advanced cholesterol panel revealed LDL of 183. This was comprised of 2086 total particles and 271 small dense particles. HDL is protective at 67. Triglycerides 111 indicating no dietary component  Based on this result her LDL goal is less than 120, ideally less than 90  Her mother had a heart attack at 32 and also stroke @ the same age Her father had a stroke at 23.  She's been intolerant to simvastatin, atorvastatin, and pravastatin.  Her sister is on Crestor.  She does not exercise. She is on a modified heart healthy diet.  Other than occasional palpitations at rest she has no cardiopulmonary symptoms.    Review of Systems   Chest pain, tachycardia, exertional dyspnea, paroxysmal nocturnal dyspnea, claudication or edema are absent.       Objective:   Physical Exam   Pertinent or positive findings include: There is accentuated curvature of upper thoracic spine.  Appears healthy and well-nourished & in no acute distress  No carotid bruits are present.No neck vein distention present at 10 - 15 degrees. Thyroid normal to palpation  Heart rhythm and rate are normal with no gallop or murmur  Chest is clear with no increased work of breathing  There is no evidence of aortic aneurysm or renal artery bruits  Abdomen soft with no organomegaly or masses. No HJR  No clubbing, cyanosis or edema present.  Pedal pulses are intact   No ischemic skin changes are present . Fingernails healthy   Alert and oriented. Strength, tone, DTRs reflexes normal          Assessment & Plan:  #1 dyslipidemia with greater than 60% increased risk of cardiovascular event  #2 intolerance to multiple statins  Plan: Nutrition & exercise will be initiated with follow-up fasting lipids in 4 months. If the LDL remains above 120; her options are  Crestor,Livalo, or Zetia. Low-dose aspirin as recommended.  The pathophysiology of dyslipidemia was discussed in detail

## 2014-06-12 ENCOUNTER — Ambulatory Visit
Admission: RE | Admit: 2014-06-12 | Discharge: 2014-06-12 | Disposition: A | Discharge status: Home / Self Care | Payer: Medicare HMO | Source: Ambulatory Visit

## 2014-06-12 DIAGNOSIS — Z1231 Encounter for screening mammogram for malignant neoplasm of breast: Secondary | ICD-10-CM

## 2014-07-02 DIAGNOSIS — H35352 Cystoid macular degeneration, left eye: Secondary | ICD-10-CM | POA: Diagnosis not present

## 2014-07-23 ENCOUNTER — Telehealth: Payer: Self-pay | Admitting: Internal Medicine

## 2014-07-23 ENCOUNTER — Other Ambulatory Visit: Payer: Self-pay | Admitting: Internal Medicine

## 2014-07-23 DIAGNOSIS — IMO0002 Reserved for concepts with insufficient information to code with codable children: Secondary | ICD-10-CM

## 2014-07-23 NOTE — Telephone Encounter (Signed)
The Urology referral will be scheduled and you'll be notified of the time.Please call the Referral Co-Ordinator @ [phone] if you have not been notified of appointment time within 7-10    Needs to establish with new PCP !!!

## 2014-07-23 NOTE — Telephone Encounter (Signed)
Pt would like a referral put in to go back and see:   [facility]: Carolan Clines I MD  Doctor  Address: [address], [place], [place] [address]  [phone]

## 2014-07-23 NOTE — Telephone Encounter (Signed)
Patient has been advised

## 2014-08-07 DIAGNOSIS — N8111 Cystocele, midline: Secondary | ICD-10-CM | POA: Diagnosis not present

## 2014-08-07 DIAGNOSIS — N952 Postmenopausal atrophic vaginitis: Secondary | ICD-10-CM | POA: Diagnosis not present

## 2014-08-07 DIAGNOSIS — N362 Urethral caruncle: Secondary | ICD-10-CM | POA: Diagnosis not present

## 2014-08-27 ENCOUNTER — Encounter: Payer: Self-pay | Admitting: Internal Medicine

## 2014-08-27 ENCOUNTER — Ambulatory Visit (INDEPENDENT_AMBULATORY_CARE_PROVIDER_SITE_OTHER): Payer: Commercial Managed Care - HMO | Admitting: Internal Medicine

## 2014-08-27 ENCOUNTER — Other Ambulatory Visit (INDEPENDENT_AMBULATORY_CARE_PROVIDER_SITE_OTHER): Payer: Commercial Managed Care - HMO

## 2014-08-27 VITALS — BP 142/90 | HR 73 | Temp 98.3°F | Resp 16 | Ht 60.0 in | Wt 149.0 lb

## 2014-08-27 DIAGNOSIS — R7301 Impaired fasting glucose: Secondary | ICD-10-CM

## 2014-08-27 DIAGNOSIS — E785 Hyperlipidemia, unspecified: Secondary | ICD-10-CM

## 2014-08-27 DIAGNOSIS — R748 Abnormal levels of other serum enzymes: Secondary | ICD-10-CM

## 2014-08-27 DIAGNOSIS — Z0000 Encounter for general adult medical examination without abnormal findings: Secondary | ICD-10-CM | POA: Diagnosis not present

## 2014-08-27 DIAGNOSIS — M25562 Pain in left knee: Secondary | ICD-10-CM

## 2014-08-27 LAB — POCT URINALYSIS DIPSTICK
BILIRUBIN UA: NEGATIVE
Blood, UA: NEGATIVE
GLUCOSE UA: NEGATIVE
KETONES UA: NEGATIVE
LEUKOCYTES UA: NEGATIVE
Nitrite, UA: NEGATIVE
PROTEIN UA: NEGATIVE
Spec Grav, UA: 1.01
Urobilinogen, UA: 0.2
pH, UA: 7.5

## 2014-08-27 LAB — BASIC METABOLIC PANEL
BUN: 15 mg/dL (ref 6–23)
CHLORIDE: 101 meq/L (ref 96–112)
CO2: 32 mEq/L (ref 19–32)
Calcium: 10.5 mg/dL (ref 8.4–10.5)
Creatinine, Ser: 0.78 mg/dL (ref 0.40–1.20)
GFR: 94.2 mL/min (ref >60.00)
Glucose, Bld: 94 mg/dL (ref 70–99)
POTASSIUM: 4.2 meq/L (ref 3.5–5.1)
SODIUM: 140 meq/L (ref 135–145)

## 2014-08-27 LAB — LIPID PANEL
CHOL/HDL RATIO: 4
Cholesterol: 280 mg/dL — ABNORMAL HIGH (ref 0–200)
HDL: 64.7 mg/dL (ref >39.00)
LDL CALC: 197 mg/dL — AB (ref 0–99)
NonHDL: 215.3
Triglycerides: 93 mg/dL (ref 0.0–149.0)
VLDL: 18.6 mg/dL (ref 0.0–40.0)

## 2014-08-27 LAB — HEMOGLOBIN A1C: HEMOGLOBIN A1C: 6.1 % (ref 4.6–6.5)

## 2014-08-27 LAB — CK: CK TOTAL: 391 U/L — AB (ref 7–177)

## 2014-08-27 NOTE — Assessment & Plan Note (Signed)
Check today off statins.

## 2014-08-27 NOTE — Patient Instructions (Signed)
We will check on your blood work today. We will also check on your urine today.   It is very important to start exercising and work up to 3-4 times per week for 30 minutes. This will help to keep your heart healthy and strong and doing well.   We would like to see you back in about 6 months to make sure you are doing well.   Exercise to Stay Healthy Exercise helps you become and stay healthy. EXERCISE IDEAS AND TIPS Choose exercises that:  You enjoy.  Fit into your day. You do not need to exercise really hard to be healthy. You can do exercises at a slow or medium level and stay healthy. You can:  Stretch before and after working out.  Try yoga, Pilates, or tai chi.  Lift weights.  Walk fast, swim, jog, run, climb stairs, bicycle, dance, or rollerskate.  Take aerobic classes. Exercises that burn about 150 calories:  Running 1  miles in 15 minutes.  Playing volleyball for 45 to 60 minutes.  Washing and waxing a car for 45 to 60 minutes.  Playing touch football for 45 minutes.  Walking 1  miles in 35 minutes.  Pushing a stroller 1  miles in 30 minutes.  Playing basketball for 30 minutes.  Raking leaves for 30 minutes.  Bicycling 5 miles in 30 minutes.  Walking 2 miles in 30 minutes.  Dancing for 30 minutes.  Shoveling snow for 15 minutes.  Swimming laps for 20 minutes.  Walking up stairs for 15 minutes.  Bicycling 4 miles in 15 minutes.  Gardening for 30 to 45 minutes.  Jumping rope for 15 minutes.  Washing windows or floors for 45 to 60 minutes. Document Released: 07/18/2010 Document Revised: 09/07/2011 Document Reviewed: 07/18/2010 [facility] Patient Information 2015 [place], [place]. This information is not intended to replace advice given to you by your health care provider. Make sure you discuss any questions you have with your health care provider.

## 2014-08-27 NOTE — Assessment & Plan Note (Addendum)
Due for shingles which she declines. She has had bone density in the past but cannot recall when or if there were problems. Flu shot and mammogram up to date. Colonoscopy due in 2023. Also needs prevnar but did not want today.

## 2014-08-27 NOTE — Assessment & Plan Note (Signed)
Check lipid panel today. She has not started exercising as recommended last visit. Reminded her that this is very important. Her diet is mediocre and she has tried to cut back on fried foods and encouraged her to cut back a little more.

## 2014-08-27 NOTE — Assessment & Plan Note (Signed)
Check HgA1c today. Has not been checked before.

## 2014-08-27 NOTE — Progress Notes (Signed)
Pre visit review using our clinic review tool, if applicable. No additional management support is needed unless otherwise documented below in the visit note. 

## 2014-08-27 NOTE — Assessment & Plan Note (Signed)
Doing well with conservative treatment with exercise. Continue and re-evaluate if need changes.

## 2014-08-27 NOTE — Progress Notes (Signed)
   Subjective:    Patient ID: [name], female    DOB: 08-08-1945, 69 y.o.   MRN: [id number]  HPI The patient is a 69 YO female who is coming in today for wellness.   Diet: not healthy, trying to limit fried foods to 1 every 2 weeks Physical activity: sedentary Depression/mood screen: negative Hearing: intact to whispered voice Visual acuity: grossly normal, performs annual eye exam  ADLs: capable Fall risk: none Home safety: good Cognitive evaluation: intact to orientation, naming, recall and repetition EOL planning: adv directives discussed, she has not thought about it  I have personally reviewed and have noted 1. The patient's medical and social history 2. Their use of alcohol, tobacco or illicit drugs 3. Their current medications and supplements 4. The patient's functional ability including ADL's, fall risks, home safety risks and hearing or visual impairment. 5. Diet and physical activities 6. Evidence for depression or mood disorders 7. Care team reviewed and updated  Review of Systems  Constitutional: Negative for fever, activity change, appetite change, fatigue and unexpected weight change.  HENT: Negative.   Eyes: Negative.   Respiratory: Negative for cough, chest tightness, shortness of breath and wheezing.   Cardiovascular: Negative for chest pain, palpitations and leg swelling.  Gastrointestinal: Negative for nausea, abdominal pain, diarrhea, constipation and abdominal distention.  Musculoskeletal: Positive for arthralgias. Negative for myalgias, back pain and gait problem.       Left knee  Skin: Negative.   Neurological: Negative.   Psychiatric/Behavioral: Negative.       Objective:   Physical Exam  Constitutional: She is oriented to person, place, and time. She appears well-developed and well-nourished.  Overweight  HENT:  Head: Normocephalic and atraumatic.  Right Ear: External ear normal.  Left Ear: External ear normal.  Eyes: EOM are normal.    Neck: Normal range of motion.  Cardiovascular: Normal rate, regular rhythm and intact distal pulses.   No murmur heard. No carotid bruits  Pulmonary/Chest: Effort normal and breath sounds normal. No respiratory distress. She has no wheezes. She has no rales.  Abdominal: Soft. Bowel sounds are normal. She exhibits no distension. There is no tenderness. There is no rebound.  Musculoskeletal: She exhibits no edema.  Neurological: She is alert and oriented to person, place, and time. Coordination normal.  Skin: Skin is warm and dry.   Filed Vitals:   08/27/14 0804  BP: 142/90  Pulse: 73  Temp: 98.3 F (36.8 C)  TempSrc: Oral  Resp: 16  Height: 5' (1.524 m)  Weight: 149 lb (67.586 kg)  SpO2: 95%      Assessment & Plan:

## 2014-09-18 ENCOUNTER — Telehealth: Payer: Self-pay | Admitting: Internal Medicine

## 2014-09-18 NOTE — Telephone Encounter (Signed)
Would like a call back in regards to resent lab results.

## 2014-09-26 ENCOUNTER — Telehealth: Payer: Self-pay | Admitting: Internal Medicine

## 2014-09-26 NOTE — Telephone Encounter (Signed)
Pt called in and would like a call back with lab results and would like a copy mailed to her

## 2014-09-26 NOTE — Telephone Encounter (Signed)
I called patient about her labs. You recommended a statin. Patient says she does not want to take a statin and she wants to know if there is anything else she can take. Please advise thanks.

## 2014-09-27 NOTE — Telephone Encounter (Signed)
I spoke with patient and she said she will try some fish oil.

## 2014-09-27 NOTE — Telephone Encounter (Signed)
She could try using fish oil (3g daily) to see if this helps some.

## 2014-10-03 ENCOUNTER — Ambulatory Visit (INDEPENDENT_AMBULATORY_CARE_PROVIDER_SITE_OTHER): Payer: Commercial Managed Care - HMO | Admitting: Internal Medicine

## 2014-10-03 ENCOUNTER — Telehealth: Payer: Self-pay | Admitting: Internal Medicine

## 2014-10-03 ENCOUNTER — Encounter: Payer: Self-pay | Admitting: Internal Medicine

## 2014-10-03 VITALS — BP 150/96 | HR 73 | Temp 98.4°F | Resp 15 | Ht 60.0 in | Wt 147.8 lb

## 2014-10-03 DIAGNOSIS — I10 Essential (primary) hypertension: Secondary | ICD-10-CM | POA: Insufficient documentation

## 2014-10-03 DIAGNOSIS — M7542 Impingement syndrome of left shoulder: Secondary | ICD-10-CM | POA: Diagnosis not present

## 2014-10-03 MED ORDER — HYDROCHLOROTHIAZIDE 12.5 MG PO CAPS: 12.5000 mg | ORAL_CAPSULE | Freq: Every day | ORAL | Status: DC

## 2014-10-03 NOTE — Progress Notes (Signed)
Pre visit review using our clinic review tool, if applicable. No additional management support is needed unless otherwise documented below in the visit note. 

## 2014-10-03 NOTE — Progress Notes (Signed)
   Subjective:    Patient ID: [name], female    DOB: 1946/05/18, 69 y.o.   MRN: [id number]  HPI  Her blood pressure has ranged 140/ 85-156/87. She's not on medications. Remotely she was on HCTZ from her gynecologist for edema of the hands. She does restrict salt. She is not exercising.  Family history is strongly positive for hypertension among 3 brothers and 3 sisters as well as her mother. Her mother had a stroke at 52.  She has occasional left headache over the temple/crown which resolves with Tylenol. She also has occasional palpitations. TSH 1.87 in 7/15. Her ophthalmologic exam is up-to-date. She's been told that her pressure is slightly elevated. She has no other visual changes.  Additionally she describes pain in the left shoulder for the last 3+ weeks. There was no injury or predisposition for this. It is positional and can be sharp. At this time it's a level IV. It has responded to Tylenol. She noticed some localized stiffness.  Review of Systems  Chest pain,  tachycardia, exertional dyspnea, paroxysmal nocturnal dyspnea, claudication or edema are absent. Blurred vision , diplopia or vision loss absent. Vertigo, near syncope or imbalance denied. There is no numbness, tingling, or weakness in extremities.   No loss of control of bladder or bowels. Radicular type pain absent.      Objective:   Physical Exam  Pertinent or positive findings include: She has slight ptosis of the left eye.  Fundi are difficult to visualize.  She has a grade 5/4-6 systolic murmur at the base.  There is accentuated curvature of the upper thoracic spine. She has flexion contractures of the fifth fingers bilaterally.  She has slight isolated PIP fusiform changes.  There is crepitus of her knees.  She has some pain with range of motion of the left shoulder but range of motion, strength, and tone are normal.  Deep tendon reflexes are normal.  Appears healthy and well-nourished & in no acute  distress No carotid bruits are present.No neck vein distention present at 10 - 15 degrees. Thyroid normal to palpation Heart rhythm and rate are normal with no gallop . Chest is clear with no increased work of breathing There is no evidence of aortic aneurysm or renal artery bruits Abdomen soft with no organomegaly or masses. No HJR No clubbing, cyanosis or edema present. Pedal pulses are intact  No ischemic skin changes are present . Fingernails healthy  Alert and oriented.        Assessment & Plan:  #1 hypertension  #2 left shoulder impingement syndrome  Plan: See orders and after visit summary

## 2014-10-03 NOTE — Telephone Encounter (Signed)
emmi mailed  °

## 2014-10-03 NOTE — Patient Instructions (Addendum)
Minimal Blood Pressure Goal= AVERAGE < 140/90;  Ideal is an AVERAGE < 135/85. This AVERAGE should be calculated from @ least 5-7 BP readings taken @ different times of day on different days of week. You should not respond to isolated BP readings , but rather the AVERAGE for that week .Please bring your  blood pressure cuff to office visits to verify that it is reliable.It  can also be checked against the blood pressure device at the pharmacy. Finger or wrist cuffs are not dependable; an arm cuff is. Use an anti-inflammatory cream such as Aspercreme or Zostrix cream twice a day to the left shoulder as needed. In lieu of this warm moist compresses or  hot water bottle can be used. Do not apply ice .I recommend you see Dr Charlann Boxer, Sports Medicine specialist., phone # [phone] if no better. To prevent palpitations or premature beats, avoid stimulants such as decongestants, diet pills, nicotine, or caffeine (coffee, tea, cola, or chocolate) to excess.

## 2014-10-17 DIAGNOSIS — H35352 Cystoid macular degeneration, left eye: Secondary | ICD-10-CM | POA: Diagnosis not present

## 2014-10-17 DIAGNOSIS — H2511 Age-related nuclear cataract, right eye: Secondary | ICD-10-CM | POA: Diagnosis not present

## 2014-10-31 ENCOUNTER — Telehealth: Payer: Self-pay

## 2014-10-31 NOTE — Telephone Encounter (Signed)
Call to the patient and voice mail was not set up. Will try again.

## 2014-11-05 NOTE — Telephone Encounter (Signed)
2nd call attempted to educate on AWV and there was no answer and VM not set up.

## 2014-11-29 ENCOUNTER — Telehealth: Payer: Self-pay | Admitting: Internal Medicine

## 2014-11-29 NOTE — Telephone Encounter (Signed)
Son states mother would like to transfer to Dr. Ronnald Ramp from Dr. Doug Sou since himself and his father are patients of Dr. Ronnald Ramp.  Please advise.

## 2015-02-22 ENCOUNTER — Ambulatory Visit (INDEPENDENT_AMBULATORY_CARE_PROVIDER_SITE_OTHER): Payer: Commercial Managed Care - HMO | Admitting: Internal Medicine

## 2015-02-22 ENCOUNTER — Other Ambulatory Visit (INDEPENDENT_AMBULATORY_CARE_PROVIDER_SITE_OTHER): Payer: Commercial Managed Care - HMO

## 2015-02-22 ENCOUNTER — Encounter: Payer: Self-pay | Admitting: Internal Medicine

## 2015-02-22 VITALS — BP 132/84 | HR 64 | Temp 98.1°F | Resp 16 | Wt 148.0 lb

## 2015-02-22 DIAGNOSIS — K648 Other hemorrhoids: Secondary | ICD-10-CM

## 2015-02-22 DIAGNOSIS — K625 Hemorrhage of anus and rectum: Secondary | ICD-10-CM

## 2015-02-22 DIAGNOSIS — K644 Residual hemorrhoidal skin tags: Secondary | ICD-10-CM

## 2015-02-22 LAB — CBC WITH DIFFERENTIAL/PLATELET
BASOS ABS: 0 10*3/uL (ref 0.0–0.1)
Basophils Relative: 0.4 % (ref 0.0–3.0)
EOS PCT: 0.9 % (ref 0.0–5.0)
Eosinophils Absolute: 0.1 10*3/uL (ref 0.0–0.7)
HEMATOCRIT: 41.8 % (ref 36.0–46.0)
Hemoglobin: 13.9 g/dL (ref 12.0–15.0)
LYMPHS PCT: 34.1 % (ref 12.0–46.0)
Lymphs Abs: 2.2 10*3/uL (ref 0.7–4.0)
MCHC: 33.3 g/dL (ref 30.0–36.0)
MCV: 90.5 fl (ref 78.0–100.0)
Monocytes Absolute: 0.5 10*3/uL (ref 0.1–1.0)
Monocytes Relative: 7.6 % (ref 3.0–12.0)
NEUTROS PCT: 57 % (ref 43.0–77.0)
Neutro Abs: 3.6 10*3/uL (ref 1.4–7.7)
Platelets: 313 10*3/uL (ref 150.0–400.0)
RBC: 4.61 Mil/uL (ref 3.87–5.11)
RDW: 14.5 % (ref 11.5–15.5)
WBC: 6.4 10*3/uL (ref 4.0–10.5)

## 2015-02-22 MED ORDER — HYDROCORTISONE 2.5 % RE CREA: 1.0000 "application " | TOPICAL_CREAM | Freq: Two times a day (BID) | RECTAL | Status: DC

## 2015-02-22 NOTE — Patient Instructions (Signed)
  Cleanse rectal area with lather of mild shampoo in shower  as discussed. After bowel movement,use tissue to cleanse the bulk of stool & finish up with TUCKS  or Baby Wipes.  Sitz baths followed by the  Medication 2 to 3 times a day to shrink the hemorrhoids. Stay well hydrated and avoid popcorn and some other materials which might aggravate hemorrhoids.

## 2015-02-22 NOTE — Progress Notes (Signed)
Pre visit review using our clinic review tool, if applicable. No additional management support is needed unless otherwise documented below in the visit note. 

## 2015-02-22 NOTE — Progress Notes (Signed)
   Subjective:    Patient ID: [name], female    DOB: Sep 16, 1945, 69 y.o.   MRN: [id number]  HPI  This morning she noted bright blood on the tissue paper as well as a scant amount in the toilet bowl. This was painless.  She has no other bleeding dyscrasias.  She did have brief lower abdominal pain 8/24 which resolved without intervention. There was no associated fever , chills, or change in stools. Constipation is somewhat of a chronic issue. She takes laxatives as needed  She had a colonoscopy 03/04/12; biopsy of the mucosa revealed lymphoid aggregation. Endoscopy at that time revealed mild inflammatory gastritis.  She does have frequency but is on HCTZ.  Review of Systems Epistaxis, hemoptysis, hematuria, or melena denied. No unexplained weight loss, significant dyspepsia,dysphagia, or abdominal pain.  There is no abnormal bruising , bleeding, or difficulty stopping bleeding with injury. Dysuria, pyuria, hematuria,  nocturia or polyuria are denied.     Objective:   Physical Exam  Pertinent or positive findings include: Arcus senilis is present. There is marked accentuation of the thoracic spine curvature. Bowel sounds are hyperactive. She has an extensive midline abdominal vertical operative scar which is well-healed. There are flexion contractures of the fifth digits bilaterally. There is a large hemorrhoidal tag which is not bleeding. Stool from within the rectal vault was negative for blood.  General appearance :adequately nourished; in no distress.  Eyes: No conjunctival inflammation or scleral icterus is present.  Oral exam:  Lips and gums are healthy appearing.There is no oropharyngeal erythema or exudate noted. Dental hygiene is good.  Heart:  Normal rate and regular rhythm. S1 and S2 normal without gallop, murmur, click, rub or other extra sounds    Lungs:Chest clear to auscultation; no wheezes, rhonchi,rales ,or rubs present.No increased work of breathing.   Abdomen:  soft and non-tender without masses, organomegaly or hernias noted.  No guarding or rebound.   Vascular : all pulses equal ; no bruits present.  Skin:Warm & dry.  Intact without suspicious lesions or rashes ; no tenting    Lymphatic: No lymphadenopathy is noted about the head, neck, axilla  Neuro: Strength, tone normal.     Assessment & Plan:  #1 rectal bleeding. Colonoscopy is up-to-date.  #2 external hemorrhoid, no visible bleeding  Plan: See orders and recommendations

## 2015-04-01 ENCOUNTER — Other Ambulatory Visit: Payer: Self-pay | Admitting: Internal Medicine

## 2015-04-02 ENCOUNTER — Encounter: Payer: Self-pay | Admitting: Gastroenterology

## 2015-05-03 ENCOUNTER — Other Ambulatory Visit: Payer: Self-pay | Admitting: Internal Medicine

## 2015-05-03 ENCOUNTER — Encounter: Payer: Self-pay | Admitting: Internal Medicine

## 2015-05-03 ENCOUNTER — Other Ambulatory Visit (INDEPENDENT_AMBULATORY_CARE_PROVIDER_SITE_OTHER): Payer: Commercial Managed Care - HMO

## 2015-05-03 ENCOUNTER — Ambulatory Visit (INDEPENDENT_AMBULATORY_CARE_PROVIDER_SITE_OTHER): Payer: Commercial Managed Care - HMO | Admitting: Internal Medicine

## 2015-05-03 VITALS — BP 136/82 | HR 65 | Temp 98.2°F | Resp 14 | Ht 59.0 in | Wt 147.8 lb

## 2015-05-03 DIAGNOSIS — E785 Hyperlipidemia, unspecified: Secondary | ICD-10-CM | POA: Diagnosis not present

## 2015-05-03 DIAGNOSIS — Z23 Encounter for immunization: Secondary | ICD-10-CM | POA: Diagnosis not present

## 2015-05-03 LAB — HEMOGLOBIN A1C: Hgb A1c MFr Bld: 6.2 % (ref 4.6–6.5)

## 2015-05-03 LAB — LIPID PANEL
CHOLESTEROL: 268 mg/dL — AB (ref 0–200)
HDL: 55.7 mg/dL (ref >39.00)
LDL Cholesterol: 186 mg/dL — ABNORMAL HIGH (ref 0–99)
NONHDL: 212.07
TRIGLYCERIDES: 131 mg/dL (ref 0.0–149.0)
Total CHOL/HDL Ratio: 5
VLDL: 26.2 mg/dL (ref 0.0–40.0)

## 2015-05-03 MED ORDER — ROSUVASTATIN CALCIUM 20 MG PO TABS: 20.0000 mg | ORAL_TABLET | Freq: Every day | ORAL | Status: DC

## 2015-05-03 NOTE — Progress Notes (Signed)
   Subjective:    Patient ID: [name], female    DOB: [birth year], 69 y.o.   MRN: [id number]  HPI The patient is a 69 YO female coming in for follow up of her cholesterol. She has been on cholesterol medicines before and did okay with crestor but had trouble with simvastatin, pravastatin, lipitor. She is not exercising. Her diet has been slightly more poor lately as her brother died in 02/21/2023 and this caused some extra stress.   Review of Systems  Constitutional: Negative for fever, activity change, appetite change, fatigue and unexpected weight change.  Respiratory: Negative for cough, chest tightness, shortness of breath and wheezing.   Cardiovascular: Negative for chest pain, palpitations and leg swelling.  Gastrointestinal: Negative for nausea, abdominal pain, diarrhea, constipation and abdominal distention.  Musculoskeletal: Positive for arthralgias. Negative for myalgias, back pain and gait problem.  Skin: Negative.   Neurological: Negative.   Psychiatric/Behavioral: Negative.       Objective:   Physical Exam  Constitutional: She is oriented to person, place, and time. She appears well-developed and well-nourished.  Overweight  HENT:  Head: Normocephalic and atraumatic.  Right Ear: External ear normal.  Left Ear: External ear normal.  Eyes: EOM are normal.  Neck: Normal range of motion.  Cardiovascular: Normal rate and regular rhythm.   Pulmonary/Chest: Effort normal and breath sounds normal. No respiratory distress. She has no wheezes. She has no rales.  Abdominal: Soft. Bowel sounds are normal. She exhibits no distension. There is no tenderness. There is no rebound.  Musculoskeletal: She exhibits no edema.  Neurological: She is alert and oriented to person, place, and time. Coordination normal.  Skin: Skin is warm and dry.   Filed Vitals:   05/03/15 0805  BP: 136/82  Pulse: 65  Temp: 98.2 F (36.8 C)  TempSrc: Oral  Resp: 14  Height: 4\' 11"  (1.499 m)  Weight:  147 lb 12.8 oz (67.042 kg)  SpO2: 97%      Assessment & Plan:  Flu and prevnar 13 given at visit.

## 2015-05-03 NOTE — Addendum Note (Signed)
Addended by: Resa Miner R on: 05/03/2015 04:55 PM   Modules accepted: Orders

## 2015-05-03 NOTE — Progress Notes (Signed)
Pre visit review using our clinic review tool, if applicable. No additional management support is needed unless otherwise documented below in the visit note. 

## 2015-05-03 NOTE — Patient Instructions (Signed)
We will recheck the cholesterol today. If it is high we will call in the cholesterol medicine crestor. We want you to take it once a week for the first month.   Exercising to Stay Healthy Exercising regularly is important. It has many health benefits, such as:  Improving your overall fitness, flexibility, and endurance.  Increasing your bone density.  Helping with weight control.  Decreasing your body fat.  Increasing your muscle strength.  Reducing stress and tension.  Improving your overall health. In order to become healthy and stay healthy, it is recommended that you do moderate-intensity and vigorous-intensity exercise. You can tell that you are exercising at a moderate intensity if you have a higher heart rate and faster breathing, but you are still able to hold a conversation. You can tell that you are exercising at a vigorous intensity if you are breathing much harder and faster and cannot hold a conversation while exercising. HOW OFTEN SHOULD I EXERCISE? Choose an activity that you enjoy and set realistic goals. Your health care provider can help you to make an activity plan that works for you. Exercise regularly as directed by your health care provider. This may include:   Doing resistance training twice each week, such as:  Push-ups.  Sit-ups.  Lifting weights.  Using resistance bands.  Doing a given intensity of exercise for a given amount of time. Choose from these options:  150 minutes of moderate-intensity exercise every week.  75 minutes of vigorous-intensity exercise every week.  A mix of moderate-intensity and vigorous-intensity exercise every week. Children, pregnant women, people who are out of shape, people who are overweight, and older adults may need to consult a health care provider for individual recommendations. If you have any sort of medical condition, be sure to consult your health care provider before starting a new exercise program.  WHAT ARE  SOME EXERCISE IDEAS? Some moderate-intensity exercise ideas include:   Walking at a rate of 1 mile in 15 minutes.  Biking.  Hiking.  Golfing.  Dancing. Some vigorous-intensity exercise ideas include:   Walking at a rate of at least 4.5 miles per hour.  Jogging or running at a rate of 5 miles per hour.  Biking at a rate of at least 10 miles per hour.  Lap swimming.  Roller-skating or in-line skating.  Cross-country skiing.  Vigorous competitive sports, such as football, basketball, and soccer.  Jumping rope.  Aerobic dancing. WHAT ARE SOME EVERYDAY ACTIVITIES THAT CAN HELP ME TO GET EXERCISE?  Yard work, such as:  Psychologist, educational.  Raking and bagging leaves.  Washing and waxing your car.  Pushing a stroller.  Shoveling snow.  Gardening.  Washing windows or floors. HOW CAN I BE MORE ACTIVE IN MY DAY-TO-DAY ACTIVITIES?  Use the stairs instead of the elevator.  Take a walk during your lunch break.  If you drive, park your car farther away from work or school.  If you take public transportation, get off one stop early and walk the rest of the way.  Make all of your phone calls while standing up and walking around.  Get up, stretch, and walk around every 30 minutes throughout the day. WHAT GUIDELINES SHOULD I FOLLOW WHILE EXERCISING?  Do not exercise so much that you hurt yourself, feel dizzy, or get very short of breath.  Consult your health care provider before starting a new exercise program.  Wear comfortable clothes and shoes with good support.  Drink plenty of water while you  exercise to prevent dehydration or heat stroke. Body water is lost during exercise and must be replaced.  Work out until you breathe faster and your heart beats faster.   This information is not intended to replace advice given to you by your health care provider. Make sure you discuss any questions you have with your health care provider.   Document Released:  07/18/2010 Document Revised: 07/06/2014 Document Reviewed: 11/16/2013 Elsevier Interactive Patient Education [employer].

## 2015-05-03 NOTE — Assessment & Plan Note (Signed)
Previous LDL 196 which is much above goal. Will recheck today and start crestor if appropriate. Will have her start once a week for 1 month and then increase to 2 times per week and gradually increase as able.

## 2015-05-06 ENCOUNTER — Other Ambulatory Visit: Payer: Self-pay

## 2015-05-06 DIAGNOSIS — Z1231 Encounter for screening mammogram for malignant neoplasm of breast: Secondary | ICD-10-CM

## 2015-05-13 ENCOUNTER — Telehealth: Payer: Self-pay | Admitting: Internal Medicine

## 2015-05-13 NOTE — Telephone Encounter (Signed)
Pt request to speak to the assistant concern about direction of Crestor. Please give her a call back  Phone # [phone]

## 2015-05-13 NOTE — Telephone Encounter (Signed)
I spoke with patient and informed her of her instructions for her crestor.

## 2015-05-30 ENCOUNTER — Encounter: Payer: Self-pay | Admitting: Obstetrics and Gynecology

## 2015-05-30 ENCOUNTER — Ambulatory Visit (INDEPENDENT_AMBULATORY_CARE_PROVIDER_SITE_OTHER): Payer: Commercial Managed Care - HMO | Admitting: Obstetrics and Gynecology

## 2015-05-30 ENCOUNTER — Other Ambulatory Visit (HOSPITAL_COMMUNITY)
Admission: RE | Admit: 2015-05-30 | Discharge: 2015-05-30 | Disposition: A | Discharge status: Home / Self Care | Payer: Commercial Managed Care - HMO | Source: Ambulatory Visit | Attending: Obstetrics and Gynecology | Admitting: Obstetrics and Gynecology

## 2015-05-30 VITALS — BP 119/75 | HR 84 | Resp 18 | Ht <= 58 in | Wt 147.0 lb

## 2015-05-30 DIAGNOSIS — Z1151 Encounter for screening for human papillomavirus (HPV): Secondary | ICD-10-CM | POA: Diagnosis not present

## 2015-05-30 DIAGNOSIS — Z1272 Encounter for screening for malignant neoplasm of vagina: Secondary | ICD-10-CM

## 2015-05-30 DIAGNOSIS — Z124 Encounter for screening for malignant neoplasm of cervix: Secondary | ICD-10-CM | POA: Insufficient documentation

## 2015-05-30 DIAGNOSIS — Z01419 Encounter for gynecological examination (general) (routine) without abnormal findings: Secondary | ICD-10-CM

## 2015-05-30 NOTE — Progress Notes (Signed)
Subjective:     [name] is a 69 y.o. female G3P3 s/p TAH in 1991 who is here for a comprehensive physical exam. The patient reports some occasional vaginal pruritis which resolves with the use of the estrogen cream. She admits to not using it as prescribed but rather as a as needed basis. Patient is otherwise doing well and is without complaints. She is not sexually active. She denies any urinary incontinence  Past Medical History  Diagnosis Date  . GERD (gastroesophageal reflux disease)   . Hyperlipidemia   . History of cervical dysplasia     CIN II  . H/O hiatal hernia   . Female cystocele   . History of peritonitis     2006--  S/P PERFORATED BOWEL INJURY DURING SURGERY  . Arthritis     KNEES  . CTS (carpal tunnel syndrome)     BILATERAL LEFT > RIGHT (OCC. HAND NUMBNESS)   Past Surgical History  Procedure Laterality Date  . Nasal septum surgery  70's    REPAIR DEVIATED SEPTUM  . Laparoscopic cholecystectomy  2006    W/ PERFERATED BOWEL (INJURY)  . Tubal ligation  1980'S  . Abdominal hysterectomy  1991    W/ BILATERAL SALPINGOOPHORECTOMY  . Vaginal prolapse repair N/A 08/15/2012    Procedure: VAGINAL VAULT SUSPENSION;  Surgeon: Ailene Rud, MD;  Location: [facility];  Service: Urology;  Laterality: N/A;  ANTERIOR VAULT REPAIR WITH XENFORE   Family History  Problem Relation Age of Onset  . Coronary artery disease Mother   . Other Mother     cva  . Hypertension Mother   . Heart disease Mother   . Other Father     CVA  . Stroke Father   . Diabetes Other   . Coronary artery disease Other   . Hypertension Other   . Stroke Other   . Cancer Other     breast  . Cancer Sister     breast  . Cancer Brother     prancreatic  . Cancer Sister     breast  . Colon cancer Neg Hx     Social History   Social History  . Marital Status: Married    Spouse Name: N/A  . Number of Children: N/A  . Years of Education: N/A   Occupational History   . Not on file.   Social History Main Topics  . Smoking status: Never Smoker   . Smokeless tobacco: Never Used  . Alcohol Use: No  . Drug Use: No  . Sexual Activity: Not on file   Other Topics Concern  . Not on file   Social History Narrative   Married '68. 2 son - '69, '70; 1 son - '71: 2 grandchildren. 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   Health Maintenance  Topic Date Due  . Hepatitis C Screening  03/27/46  . ZOSTAVAX  10/04/2005  . DEXA SCAN  10/05/2010  . INFLUENZA VACCINE  01/28/2016  . MAMMOGRAM  06/12/2016  . TETANUS/TDAP  11/18/2020  . COLONOSCOPY  03/14/2022  . PNA vac Low Risk Adult  Completed       Review of Systems Pertinent items are noted in HPI.   Objective:  Blood pressure 119/75, pulse 84, resp. rate 18, height 4' 9.5" (1.461 m), weight 147 lb (66.679 kg).     GENERAL: Well-developed, well-nourished female in no acute distress.  HEENT: Normocephalic, atraumatic. Sclerae anicteric.  NECK: Supple. Normal thyroid.  LUNGS: Clear to auscultation  bilaterally.  HEART: Regular rate and rhythm. BREASTS: Symmetric in size. No palpable masses or lymphadenopathy, skin changes, or nipple drainage. ABDOMEN: Soft, nontender, nondistended. No organomegaly. PELVIC: Normal external female genitalia. Vagina is atrophic. No adnexal mass or tenderness. EXTREMITIES: No cyanosis, clubbing, or edema, 2+ distal pulses.    Assessment:    Healthy female exam.      Plan:    Pap smear collected. ACOG guidelines reviewed with the patient. Since she is over the age of 5 and that she has had negative vaginal pap smears over the past 10 years, she understands that pap smears are no longer required. It was done today per her request.  It was explained to her that she needs to have an annual exam with pelvic exam Patient due for screening mammogram in December Patient advised to perform monthly self breast and vulva exam Patient will be contacted with any  abnormal results See After Visit Summary for Counseling Recommendations

## 2015-05-30 NOTE — Addendum Note (Signed)
Addended by: Ricka Burdock on: 05/30/2015 09:32 AM   Modules accepted: Medications

## 2015-06-03 LAB — CYTOLOGY - PAP

## 2015-06-17 ENCOUNTER — Ambulatory Visit
Admission: RE | Admit: 2015-06-17 | Discharge: 2015-06-17 | Disposition: A | Discharge status: Home / Self Care | Payer: Commercial Managed Care - HMO | Source: Ambulatory Visit

## 2015-06-17 DIAGNOSIS — Z1231 Encounter for screening mammogram for malignant neoplasm of breast: Secondary | ICD-10-CM

## 2015-07-09 ENCOUNTER — Telehealth: Payer: Self-pay

## 2015-07-09 NOTE — Telephone Encounter (Signed)
Call to Ms. Kolle and she has laryngitis this week but would like to schedule next week on 1/19 at 8:30 for AWV

## 2015-07-10 ENCOUNTER — Ambulatory Visit: Payer: Commercial Managed Care - HMO

## 2015-07-11 ENCOUNTER — Encounter: Payer: Self-pay | Admitting: Internal Medicine

## 2015-07-11 ENCOUNTER — Ambulatory Visit (INDEPENDENT_AMBULATORY_CARE_PROVIDER_SITE_OTHER): Payer: Commercial Managed Care - HMO | Admitting: Internal Medicine

## 2015-07-11 VITALS — BP 126/80 | HR 91 | Temp 98.5°F | Wt 148.0 lb

## 2015-07-11 DIAGNOSIS — J069 Acute upper respiratory infection, unspecified: Secondary | ICD-10-CM | POA: Insufficient documentation

## 2015-07-11 MED ORDER — PROMETHAZINE-CODEINE 6.25-10 MG/5ML PO SYRP: 5.0000 mL | ORAL_SOLUTION | Freq: Four times a day (QID) | ORAL | Status: DC | PRN

## 2015-07-11 MED ORDER — AZITHROMYCIN 250 MG PO TABS
ORAL_TABLET | ORAL | Status: DC
Start: 2015-07-11 — End: 2015-07-18

## 2015-07-11 NOTE — Progress Notes (Signed)
Subjective:  Patient ID: [name], female    DOB: 1946-02-01  Age: 70 y.o. MRN: [id number]  CC: Sinusitis and Sore Throat   HPI [name] presents for URI sx's x 1 week. Green d/c. Cough. OTC meds did not help  Outpatient Prescriptions Prior to Visit  Medication Sig Dispense Refill  . acetaminophen (TYLENOL) 500 MG tablet Take 500-750 mg by mouth every 6 (six) hours as needed for pain.    . Esomeprazole Magnesium (NEXIUM PO) Take by mouth daily. OTC    . estradiol (ESTRACE) 0.1 MG/GM vaginal cream Place 2 g vaginally 3 (three) times a week.    . hydrochlorothiazide (MICROZIDE) 12.5 MG capsule TAKE 1 CAPSULE (12.5 MG TOTAL) BY MOUTH DAILY. 30 capsule 5  . hydrocortisone (ANUSOL-HC) 2.5 % rectal cream Place 1 application rectally 2 (two) times daily. 30 g 0  . Multiple Vitamin (MULTIVITAMIN) tablet Take 1 tablet by mouth every other day.     . Omega-3 Fatty Acids (FISH OIL PO) Take by mouth.    Marland Kitchen OVER THE COUNTER MEDICATION Place 1 suppository rectally daily as needed (for hemmorhoids).    . rosuvastatin (CRESTOR) 20 MG tablet Take 1 tablet (20 mg total) by mouth daily. 90 tablet 3  . tetrahydrozoline (VISINE) 0.05 % ophthalmic solution Place 2 drops into both eyes daily as needed (for dry eyes).     No facility-administered medications prior to visit.    ROS Review of Systems  Constitutional: Positive for chills.  HENT: Positive for congestion, postnasal drip and sinus pressure.   Respiratory: Positive for cough. Negative for shortness of breath.     Objective:  BP 126/80 mmHg  Pulse 91  Temp(Src) 98.5 F (36.9 C) (Oral)  Wt 148 lb (67.132 kg)  SpO2 96%  BP Readings from Last 3 Encounters:  07/11/15 126/80  05/30/15 119/75  05/03/15 136/82    Wt Readings from Last 3 Encounters:  07/11/15 148 lb (67.132 kg)  05/30/15 147 lb (66.679 kg)  05/03/15 147 lb 12.8 oz (67.042 kg)    Physical Exam  Constitutional: She appears well-developed. No distress.  HENT:    Head: Normocephalic.  Right Ear: External ear normal.  Left Ear: External ear normal.  Nose: Nose normal.  Mouth/Throat: Oropharynx is clear and moist.  Eyes: Conjunctivae are normal. Pupils are equal, round, and reactive to light. Right eye exhibits no discharge. Left eye exhibits no discharge.  Neck: Normal range of motion. Neck supple. No JVD present. No tracheal deviation present. No thyromegaly present.  Cardiovascular: Normal rate, regular rhythm and normal heart sounds.   Pulmonary/Chest: No stridor. No respiratory distress. She has no wheezes.  Abdominal: Soft. Bowel sounds are normal. She exhibits no distension and no mass. There is no tenderness. There is no rebound and no guarding.  Musculoskeletal: She exhibits no edema or tenderness.  Lymphadenopathy:    She has no cervical adenopathy.  Neurological: She displays normal reflexes. No cranial nerve deficit. She exhibits normal muscle tone. Coordination normal.  Skin: No rash noted. No erythema.  Psychiatric: She has a normal mood and affect. Her behavior is normal. Judgment and thought content normal.  eryth throat  Lab Results  Component Value Date   WBC 6.4 02/22/2015   HGB 13.9 02/22/2015   HCT 41.8 02/22/2015   PLT 313.0 02/22/2015   GLUCOSE 94 08/27/2014   CHOL 268* 05/03/2015   TRIG 131.0 05/03/2015   HDL 55.70 05/03/2015   LDLDIRECT 175.3 04/24/2008   LDLCALC  186* 05/03/2015   ALT 31 01/22/2014   AST 28 01/22/2014   NA 140 08/27/2014   K 4.2 08/27/2014   CL 101 08/27/2014   CREATININE 0.78 08/27/2014   BUN 15 08/27/2014   CO2 32 08/27/2014   TSH 1.87 01/22/2014   HGBA1C 6.2 05/03/2015    Mm Digital Screening Bilateral  06/17/2015  CLINICAL DATA:  Screening. EXAM: DIGITAL SCREENING BILATERAL MAMMOGRAM WITH CAD COMPARISON:  Previous exam(s). ACR Breast Density Category b: There are scattered areas of fibroglandular density. FINDINGS: There are no findings suspicious for malignancy. Images were processed  with CAD. IMPRESSION: No mammographic evidence of malignancy. A result letter of this screening mammogram will be mailed directly to the patient. RECOMMENDATION: Screening mammogram in one year. (Code:SM-B-01Y) BI-RADS CATEGORY  1: Negative. Electronically Signed   By: Claudie Revering M.D.   On: 06/17/2015 13:45    Assessment & Plan:   There are no diagnoses linked to this encounter. I am having [name] maintain her estradiol, multivitamin, acetaminophen, tetrahydrozoline, OVER THE COUNTER MEDICATION, Esomeprazole Magnesium (NEXIUM PO), Omega-3 Fatty Acids (FISH OIL PO), hydrocortisone, hydrochlorothiazide, and rosuvastatin.  No orders of the defined types were placed in this encounter.     Follow-up: No Follow-up on file.  Walker Kehr, MD

## 2015-07-11 NOTE — Progress Notes (Signed)
Pre visit review using our clinic review tool, if applicable. No additional management support is needed unless otherwise documented below in the visit note. 

## 2015-07-11 NOTE — Assessment & Plan Note (Addendum)
Zpac Prom-cod w/caution prn. Pt has tolerated it in the past ok

## 2015-07-11 NOTE — Patient Instructions (Signed)
Use over-the-counter  "cold" medicines  such as"Afrin" nasal spray for nasal congestion as directed instead. Use " Delsym" or" Robitussin" cough syrup varietis for cough.  You can use plain "Tylenol" or "Advil" for fever, chills and achyness. Use Halls or Ricola cough drops.  Please, make an appointment if you are not better or if you're worse.  

## 2015-07-18 ENCOUNTER — Ambulatory Visit (INDEPENDENT_AMBULATORY_CARE_PROVIDER_SITE_OTHER): Payer: Commercial Managed Care - HMO

## 2015-07-18 VITALS — BP 130/80 | Ht 60.0 in | Wt 148.8 lb

## 2015-07-18 DIAGNOSIS — Z780 Asymptomatic menopausal state: Secondary | ICD-10-CM | POA: Diagnosis not present

## 2015-07-18 DIAGNOSIS — Z0000 Encounter for general adult medical examination without abnormal findings: Secondary | ICD-10-CM | POA: Diagnosis not present
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
## 2015-07-18 NOTE — Patient Instructions (Addendum)
[name] , Thank you for taking time to come for your Medicare Wellness Visit. I appreciate your ongoing commitment to your health goals. Please review the following plan we discussed and let me know if I can assist you in the future.   Will dexa (bone density) at [facility]; Can schedule on the same day of apt with Dr.Crawford  Will discuss A1c; and eye issues with her as well.    These are the goals we discussed: Goals    . patient     To have peace; Celebrate the good family times; continue to develop spiritually         This is a list of the screening recommended for you and due dates:  Health Maintenance  Topic Date Due  .  Hepatitis C: One time screening is recommended by Center for Disease Control  (CDC) for  adults born from [birth year] through 1965.   04/30/1946  . Shingles Vaccine  10/04/2005  . DEXA scan (bone density measurement)  10/05/2010  . Flu Shot  01/28/2016  . Mammogram  06/16/2017  . Tetanus Vaccine  11/18/2020  . Colon Cancer Screening  03/14/2022  . Pneumonia vaccines  Completed     Bone Densitometry Bone densitometry is an imaging test that uses a special X-ray to measure the amount of calcium and other minerals in your bones (bone density). This test is also known as a bone mineral density test or dual-energy X-ray absorptiometry (DXA). The test can measure bone density at your hip and your spine. It is similar to having a regular X-ray. You may have this test to:  Diagnose a condition that causes weak or thin bones (osteoporosis).  Predict your risk of a broken bone (fracture).  Determine how well osteoporosis treatment is working. LET [facility] CARE PROVIDER KNOW ABOUT:  Any allergies you have.  All medicines you are taking, including vitamins, herbs, eye drops, creams, and over-the-counter medicines.  Previous problems you or members of your family have had with the use of anesthetics.  Any blood disorders you have.  Previous surgeries you have  had.  Medical conditions you have.  Possibility of pregnancy.  Any other medical test you had within the previous 14 days that used contrast material. RISKS AND COMPLICATIONS Generally, this is a safe procedure. However, problems can occur and may include the following:  This test exposes you to a very small amount of radiation.  The risks of radiation exposure may be greater to unborn children. BEFORE THE PROCEDURE  Do not take any calcium supplements for 24 hours before having the test. You can otherwise eat and drink what you usually do.  Take off all metal jewelry, eyeglasses, dental appliances, and any other metal objects. PROCEDURE  You may lie on an exam table. There will be an X-ray generator below you and an imaging device above you.  Other devices, such as boxes or braces, may be used to position your body properly for the scan.  You will need to lie still while the machine slowly scans your body.  The images will show up on a computer monitor. AFTER THE PROCEDURE You may need more testing at a later time.   This information is not intended to replace advice given to you by your health care provider. Make sure you discuss any questions you have with your health care provider.   Document Released: 07/07/2004 Document Revised: 07/06/2014 Document Reviewed: 11/23/2013 Elsevier Interactive Patient Education 2016 [place] Prevention in  the Home  Falls can cause injuries. They can happen to people of all ages. There are many things you can do to make your home safe and to help prevent falls.  WHAT CAN I DO ON THE OUTSIDE OF MY HOME?  Regularly fix the edges of walkways and driveways and fix any cracks.  Remove anything that might make you trip as you walk through a door, such as a raised step or threshold.  Trim any bushes or trees on the path to your home.  Use bright outdoor lighting.  Clear any walking paths of anything that might make someone trip,  such as rocks or tools.  Regularly check to see if handrails are loose or broken. Make sure that both sides of any steps have handrails.  Any raised decks and porches should have guardrails on the edges.  Have any leaves, snow, or ice cleared regularly.  Use sand or salt on walking paths during winter.  Clean up any spills in your garage right away. This includes oil or grease spills. WHAT CAN I DO IN THE BATHROOM?   Use night lights.  Install grab bars by the toilet and in the tub and shower. Do not use towel bars as grab bars.  Use non-skid mats or decals in the tub or shower.  If you need to sit down in the shower, use a plastic, non-slip stool.  Keep the floor dry. Clean up any water that spills on the floor as soon as it happens.  Remove soap buildup in the tub or shower regularly.  Attach bath mats securely with double-sided non-slip rug tape.  Do not have throw rugs and other things on the floor that can make you trip. WHAT CAN I DO IN THE BEDROOM?  Use night lights.  Make sure that you have a light by your bed that is easy to reach.  Do not use any sheets or blankets that are too big for your bed. They should not hang down onto the floor.  Have a firm chair that has side arms. You can use this for support while you get dressed.  Do not have throw rugs and other things on the floor that can make you trip. WHAT CAN I DO IN THE KITCHEN?  Clean up any spills right away.  Avoid walking on wet floors.  Keep items that you use a lot in easy-to-reach places.  If you need to reach something above you, use a strong step stool that has a grab bar.  Keep electrical cords out of the way.  Do not use floor polish or wax that makes floors slippery. If you must use wax, use non-skid floor wax.  Do not have throw rugs and other things on the floor that can make you trip. WHAT CAN I DO WITH MY STAIRS?  Do not leave any items on the stairs.  Make sure that there are  handrails on both sides of the stairs and use them. Fix handrails that are broken or loose. Make sure that handrails are as long as the stairways.  Check any carpeting to make sure that it is firmly attached to the stairs. Fix any carpet that is loose or worn.  Avoid having throw rugs at the top or bottom of the stairs. If you do have throw rugs, attach them to the floor with carpet tape.  Make sure that you have a light switch at the top of the stairs and the bottom of the stairs. If you  do not have them, ask someone to add them for you. WHAT ELSE CAN I DO TO HELP PREVENT FALLS?  Wear shoes that:  Do not have high heels.  Have rubber bottoms.  Are comfortable and fit you well.  Are closed at the toe. Do not wear sandals.  If you use a stepladder:  Make sure that it is fully opened. Do not climb a closed stepladder.  Make sure that both sides of the stepladder are locked into place.  Ask someone to hold it for you, if possible.  Clearly mark and make sure that you can see:  Any grab bars or handrails.  First and last steps.  Where the edge of each step is.  Use tools that help you move around (mobility aids) if they are needed. These include:  Canes.  Walkers.  Scooters.  Crutches.  Turn on the lights when you go into a dark area. Replace any light bulbs as soon as they burn out.  Set up your furniture so you have a clear path. Avoid moving your furniture around.  If any of your floors are uneven, fix them.  If there are any pets around you, be aware of where they are.  Review your medicines with your doctor. Some medicines can make you feel dizzy. This can increase your chance of falling. Ask your doctor what other things that you can do to help prevent falls.   This information is not intended to replace advice given to you by your health care provider. Make sure you discuss any questions you have with your health care provider.   Document Released:  04/11/2009 Document Revised: 10/30/2014 Document Reviewed: 07/20/2014 Elsevier Interactive Patient Education 2016 [place] Maintenance, Female Adopting a healthy lifestyle and getting preventive care can go a long way to promote health and wellness. Talk with your health care provider about what schedule of regular examinations is right for you. This is a good chance for you to check in with your provider about disease prevention and staying healthy. In between checkups, there are plenty of things you can do on your own. Experts have done a lot of research about which lifestyle changes and preventive measures are most likely to keep you healthy. Ask your health care provider for more information. WEIGHT AND DIET  Eat a healthy diet  Be sure to include plenty of vegetables, fruits, low-fat dairy products, and lean protein.  Do not eat a lot of foods high in solid fats, added sugars, or salt.  Get regular exercise. This is one of the most important things you can do for your health.  Most adults should exercise for at least 150 minutes each week. The exercise should increase your heart rate and make you sweat (moderate-intensity exercise).  Most adults should also do strengthening exercises at least twice a week. This is in addition to the moderate-intensity exercise.  Maintain a healthy weight  Body mass index (BMI) is a measurement that can be used to identify possible weight problems. It estimates body fat based on height and weight. Your health care provider can help determine your BMI and help you achieve or maintain a healthy weight.  For females 69 years of age and older:   A BMI below 18.5 is considered underweight.  A BMI of 18.5 to 24.9 is normal.  A BMI of 25 to 29.9 is considered overweight.  A BMI of 30 and above is considered obese.  Watch levels of cholesterol and blood  lipids  You should start having your blood tested for lipids and cholesterol at 70  years of age, then have this test every 5 years.  You may need to have your cholesterol levels checked more often if:  Your lipid or cholesterol levels are high.  You are older than 70 years of age.  You are at high risk for heart disease.  CANCER SCREENING   Lung Cancer  Lung cancer screening is recommended for adults 17-73 years old who are at high risk for lung cancer because of a history of smoking.  A yearly low-dose CT scan of the lungs is recommended for people who:  Currently smoke.  Have quit within the past 15 years.  Have at least a 30-pack-year history of smoking. A pack year is smoking an average of one pack of cigarettes a day for 1 year.  Yearly screening should continue until it has been 15 years since you quit.  Yearly screening should stop if you develop a health problem that would prevent you from having lung cancer treatment.  Breast Cancer  Practice breast self-awareness. This means understanding how your breasts normally appear and feel.  It also means doing regular breast self-exams. Let your health care provider know about any changes, no matter how small.  If you are in your 20s or 30s, you should have a clinical breast exam (CBE) by a health care provider every 1-3 years as part of a regular health exam.  If you are 22 or older, have a CBE every year. Also consider having a breast X-ray (mammogram) every year.  If you have a family history of breast cancer, talk to your health care provider about genetic screening.  If you are at high risk for breast cancer, talk to your health care provider about having an MRI and a mammogram every year.  Breast cancer gene (BRCA) assessment is recommended for women who have family members with BRCA-related cancers. BRCA-related cancers include:  Breast.  Ovarian.  Tubal.  Peritoneal cancers.  Results of the assessment will determine the need for genetic counseling and BRCA1 and BRCA2 testing. Cervical  Cancer Your health care provider may recommend that you be screened regularly for cancer of the pelvic organs (ovaries, uterus, and vagina). This screening involves a pelvic examination, including checking for microscopic changes to the surface of your cervix (Pap test). You may be encouraged to have this screening done every 3 years, beginning at age 22.  For women ages 22-65, health care providers may recommend pelvic exams and Pap testing every 3 years, or they may recommend the Pap and pelvic exam, combined with testing for human papilloma virus (HPV), every 5 years. Some types of HPV increase your risk of cervical cancer. Testing for HPV may also be done on women of any age with unclear Pap test results.  Other health care providers may not recommend any screening for nonpregnant women who are considered low risk for pelvic cancer and who do not have symptoms. Ask your health care provider if a screening pelvic exam is right for you.  If you have had past treatment for cervical cancer or a condition that could lead to cancer, you need Pap tests and screening for cancer for at least 20 years after your treatment. If Pap tests have been discontinued, your risk factors (such as having a new sexual partner) need to be reassessed to determine if screening should resume. Some women have medical problems that increase the chance of getting cervical  cancer. In these cases, your health care provider may recommend more frequent screening and Pap tests. Colorectal Cancer  This type of cancer can be detected and often prevented.  Routine colorectal cancer screening usually begins at 70 years of age and continues through 70 years of age.  Your health care provider may recommend screening at an earlier age if you have risk factors for colon cancer.  Your health care provider may also recommend using home test kits to check for hidden blood in the stool.  A small camera at the end of a tube can be used to  examine your colon directly (sigmoidoscopy or colonoscopy). This is done to check for the earliest forms of colorectal cancer.  Routine screening usually begins at age 81.  Direct examination of the colon should be repeated every 5-10 years through 70 years of age. However, you may need to be screened more often if early forms of precancerous polyps or small growths are found. Skin Cancer  Check your skin from head to toe regularly.  Tell your health care provider about any new moles or changes in moles, especially if there is a change in a mole's shape or color.  Also tell your health care provider if you have a mole that is larger than the size of a pencil eraser.  Always use sunscreen. Apply sunscreen liberally and repeatedly throughout the day.  Protect yourself by wearing long sleeves, pants, a wide-brimmed hat, and sunglasses whenever you are outside. HEART DISEASE, DIABETES, AND HIGH BLOOD PRESSURE   High blood pressure causes heart disease and increases the risk of stroke. High blood pressure is more likely to develop in:  People who have blood pressure in the high end of the normal range (130-139/85-89 mm Hg).  People who are overweight or obese.  People who are African American.  If you are 23-17 years of age, have your blood pressure checked every 3-5 years. If you are 7 years of age or older, have your blood pressure checked every year. You should have your blood pressure measured twice--once when you are at a hospital or clinic, and once when you are not at a hospital or clinic. Record the average of the two measurements. To check your blood pressure when you are not at a hospital or clinic, you can use:  An automated blood pressure machine at a pharmacy.  A home blood pressure monitor.  If you are between 68 years and 85 years old, ask your health care provider if you should take aspirin to prevent strokes.  Have regular diabetes screenings. This involves taking a  blood sample to check your fasting blood sugar level.  If you are at a normal weight and have a low risk for diabetes, have this test once every three years after 70 years of age.  If you are overweight and have a high risk for diabetes, consider being tested at a younger age or more often. PREVENTING INFECTION  Hepatitis B  If you have a higher risk for hepatitis B, you should be screened for this virus. You are considered at high risk for hepatitis B if:  You were born in a country where hepatitis B is common. Ask your health care provider which countries are considered high risk.  Your parents were born in a high-risk country, and you have not been immunized against hepatitis B (hepatitis B vaccine).  You have HIV or AIDS.  You use needles to inject street drugs.  You live with someone  who has hepatitis B.  You have had sex with someone who has hepatitis B.  You get hemodialysis treatment.  You take certain medicines for conditions, including cancer, organ transplantation, and autoimmune conditions. Hepatitis C  Blood testing is recommended for:  Everyone born from [birth year] through 1965.  Anyone with known risk factors for hepatitis C. Sexually transmitted infections (STIs)  You should be screened for sexually transmitted infections (STIs) including gonorrhea and chlamydia if:  You are sexually active and are younger than 70 years of age.  You are older than 70 years of age and your health care provider tells you that you are at risk for this type of infection.  Your sexual activity has changed since you were last screened and you are at an increased risk for chlamydia or gonorrhea. Ask your health care provider if you are at risk.  If you do not have HIV, but are at risk, it may be recommended that you take a prescription medicine daily to prevent HIV infection. This is called pre-exposure prophylaxis (PrEP). You are considered at risk if:  You are sexually active and do  not regularly use condoms or know the HIV status of your partner(s).  You take drugs by injection.  You are sexually active with a partner who has HIV. Talk with your health care provider about whether you are at high risk of being infected with HIV. If you choose to begin PrEP, you should first be tested for HIV. You should then be tested every 3 months for as long as you are taking PrEP.  PREGNANCY   If you are premenopausal and you may become pregnant, ask your health care provider about preconception counseling.  If you may become pregnant, take 400 to 800 micrograms (mcg) of folic acid every day.  If you want to prevent pregnancy, talk to your health care provider about birth control (contraception). OSTEOPOROSIS AND MENOPAUSE   Osteoporosis is a disease in which the bones lose minerals and strength with aging. This can result in serious bone fractures. Your risk for osteoporosis can be identified using a bone density scan.  If you are 12 years of age or older, or if you are at risk for osteoporosis and fractures, ask your health care provider if you should be screened.  Ask your health care provider whether you should take a calcium or vitamin D supplement to lower your risk for osteoporosis.  Menopause may have certain physical symptoms and risks.  Hormone replacement therapy may reduce some of these symptoms and risks. Talk to your health care provider about whether hormone replacement therapy is right for you.  HOME CARE INSTRUCTIONS   Schedule regular health, dental, and eye exams.  Stay current with your immunizations.   Do not use any tobacco products including cigarettes, chewing tobacco, or electronic cigarettes.  If you are pregnant, do not drink alcohol.  If you are breastfeeding, limit how much and how often you drink alcohol.  Limit alcohol intake to no more than 1 drink per day for nonpregnant women. One drink equals 12 ounces of beer, 5 ounces of wine, or 1  ounces of hard liquor.  Do not use street drugs.  Do not share needles.  Ask your health care provider for help if you need support or information about quitting drugs.  Tell your health care provider if you often feel depressed.  Tell your health care provider if you have ever been abused or do not feel safe at home.  This information is not intended to replace advice given to you by your health care provider. Make sure you discuss any questions you have with your health care provider.   Document Released: 12/29/2010 Document Revised: 07/06/2014 Document Reviewed: 05/17/2013 Elsevier Interactive Patient Education [employer].

## 2015-07-19 NOTE — Progress Notes (Signed)
Medical screening examination/treatment/procedure(s) were performed by non-physician practitioner and as supervising physician I was immediately available for consultation/collaboration. I agree with above. Elizabeth A Crawford, MD 

## 2015-07-23 ENCOUNTER — Ambulatory Visit (INDEPENDENT_AMBULATORY_CARE_PROVIDER_SITE_OTHER)
Admission: RE | Admit: 2015-07-23 | Discharge: 2015-07-23 | Disposition: A | Discharge status: Home / Self Care | Payer: Commercial Managed Care - HMO | Source: Ambulatory Visit | Attending: Internal Medicine | Admitting: Internal Medicine

## 2015-07-23 DIAGNOSIS — Z780 Asymptomatic menopausal state: Secondary | ICD-10-CM | POA: Diagnosis not present

## 2015-09-17 DIAGNOSIS — H25041 Posterior subcapsular polar age-related cataract, right eye: Secondary | ICD-10-CM | POA: Diagnosis not present

## 2015-09-17 DIAGNOSIS — H2511 Age-related nuclear cataract, right eye: Secondary | ICD-10-CM | POA: Diagnosis not present

## 2015-09-23 ENCOUNTER — Other Ambulatory Visit: Payer: Self-pay | Admitting: Internal Medicine

## 2015-10-03 DIAGNOSIS — N8111 Cystocele, midline: Secondary | ICD-10-CM | POA: Diagnosis not present

## 2015-10-03 DIAGNOSIS — N362 Urethral caruncle: Secondary | ICD-10-CM | POA: Diagnosis not present

## 2015-10-03 DIAGNOSIS — Z0000 Encounter for general adult medical examination without abnormal findings: Secondary | ICD-10-CM | POA: Diagnosis not present

## 2015-10-03 DIAGNOSIS — N952 Postmenopausal atrophic vaginitis: Secondary | ICD-10-CM | POA: Diagnosis not present

## 2015-10-31 ENCOUNTER — Ambulatory Visit: Payer: Medicare HMO | Admitting: Internal Medicine

## 2015-11-07 DIAGNOSIS — Z961 Presence of intraocular lens: Secondary | ICD-10-CM | POA: Diagnosis not present

## 2015-11-07 DIAGNOSIS — H2511 Age-related nuclear cataract, right eye: Secondary | ICD-10-CM | POA: Diagnosis not present

## 2015-11-15 ENCOUNTER — Ambulatory Visit (INDEPENDENT_AMBULATORY_CARE_PROVIDER_SITE_OTHER): Payer: Commercial Managed Care - HMO | Admitting: Internal Medicine

## 2015-11-15 ENCOUNTER — Other Ambulatory Visit (INDEPENDENT_AMBULATORY_CARE_PROVIDER_SITE_OTHER): Payer: Commercial Managed Care - HMO

## 2015-11-15 ENCOUNTER — Encounter: Payer: Self-pay | Admitting: Internal Medicine

## 2015-11-15 VITALS — BP 130/78 | HR 70 | Temp 98.5°F | Resp 16 | Ht 60.0 in | Wt 151.0 lb

## 2015-11-15 DIAGNOSIS — Z1159 Encounter for screening for other viral diseases: Secondary | ICD-10-CM | POA: Diagnosis not present

## 2015-11-15 DIAGNOSIS — E785 Hyperlipidemia, unspecified: Secondary | ICD-10-CM

## 2015-11-15 LAB — LIPID PANEL
CHOLESTEROL: 214 mg/dL — AB (ref 0–200)
HDL: 48.5 mg/dL (ref >39.00)
LDL CALC: 133 mg/dL — AB (ref 0–99)
NonHDL: 165.58
Total CHOL/HDL Ratio: 4
Triglycerides: 163 mg/dL — ABNORMAL HIGH (ref 0.0–149.0)
VLDL: 32.6 mg/dL (ref 0.0–40.0)

## 2015-11-15 LAB — COMPREHENSIVE METABOLIC PANEL
ALBUMIN: 4.2 g/dL (ref 3.5–5.2)
ALT: 27 U/L (ref 0–35)
AST: 26 U/L (ref 0–37)
Alkaline Phosphatase: 59 U/L (ref 39–117)
BUN: 15 mg/dL (ref 6–23)
CHLORIDE: 102 meq/L (ref 96–112)
CO2: 30 mEq/L (ref 19–32)
CREATININE: 0.69 mg/dL (ref 0.40–1.20)
Calcium: 10.1 mg/dL (ref 8.4–10.5)
GFR: 108.14 mL/min (ref >60.00)
GLUCOSE: 92 mg/dL (ref 70–99)
Potassium: 3.7 mEq/L (ref 3.5–5.1)
SODIUM: 140 meq/L (ref 135–145)
Total Bilirubin: 1 mg/dL (ref 0.2–1.2)
Total Protein: 7 g/dL (ref 6.0–8.3)

## 2015-11-15 NOTE — Progress Notes (Signed)
Pre visit review using our clinic review tool, if applicable. No additional management support is needed unless otherwise documented below in the visit note. 

## 2015-11-15 NOTE — Patient Instructions (Signed)
We will check the labs today and call you back with the results.   We are checking on the cholesterol and the enzyme levels.   Keep up the good work with the exercise and work to do that at least 5 times per week.

## 2015-11-16 LAB — HEPATITIS C ANTIBODY: HCV Ab: NEGATIVE

## 2015-11-17 NOTE — Assessment & Plan Note (Signed)
Taking crestor twice weekly and checking lipid panel today. Adjust as needed. She may be willing to increase to 3 times per week but is hesitant to increase more due to side effects in the past with a different statin.

## 2015-11-17 NOTE — Progress Notes (Signed)
   Subjective:    Patient ID: [name], female    DOB: 04-07-1946, 70 y.o.   MRN: [id number]  HPI The patient is a 70 YO female coming in for follow up of her cholesterol. Since last visit she has started crestor and increased to twice per week. She was supposed to continue increasing gradually per our instructions but she did not for fear of symptoms. She denies muscle cramps or other symptoms. About the same diet and exercise as previously. Could exercise more.   Review of Systems  Constitutional: Negative for fever, activity change, appetite change, fatigue and unexpected weight change.  Respiratory: Negative for cough, chest tightness, shortness of breath and wheezing.   Cardiovascular: Negative for chest pain, palpitations and leg swelling.  Gastrointestinal: Negative for nausea, abdominal pain, diarrhea, constipation and abdominal distention.  Musculoskeletal: Positive for arthralgias. Negative for myalgias.  Neurological: Negative.       Objective:   Physical Exam  Constitutional: She is oriented to person, place, and time. She appears well-developed and well-nourished.  Overweight  HENT:  Head: Normocephalic and atraumatic.  Right Ear: External ear normal.  Left Ear: External ear normal.  Eyes: EOM are normal.  Neck: Normal range of motion.  Cardiovascular: Normal rate and regular rhythm.   Pulmonary/Chest: Effort normal and breath sounds normal. No respiratory distress. She has no wheezes. She has no rales.  Abdominal: Soft. Bowel sounds are normal. She exhibits no distension. There is no tenderness. There is no rebound.  Musculoskeletal: She exhibits no edema.  Neurological: She is alert and oriented to person, place, and time. Coordination normal.  Skin: Skin is warm and dry.   Filed Vitals:   11/15/15 1017  BP: 130/78  Pulse: 70  Temp: 98.5 F (36.9 C)  TempSrc: Oral  Resp: 16  Height: 5' (1.524 m)  Weight: 151 lb (68.493 kg)  SpO2: 97%      Assessment &  Plan:

## 2015-11-22 ENCOUNTER — Other Ambulatory Visit: Payer: Self-pay | Admitting: Internal Medicine

## 2016-01-25 ENCOUNTER — Other Ambulatory Visit: Payer: Self-pay | Admitting: Internal Medicine

## 2016-01-29 ENCOUNTER — Ambulatory Visit: Payer: Commercial Managed Care - HMO | Admitting: Family

## 2016-01-29 ENCOUNTER — Telehealth: Payer: Self-pay | Admitting: Internal Medicine

## 2016-01-29 NOTE — Telephone Encounter (Signed)
Patient no showed for acute stomach pain visit 8/2 with Marya Amsler.  Please advise.

## 2016-01-30 ENCOUNTER — Telehealth: Payer: Self-pay | Admitting: Internal Medicine

## 2016-01-30 NOTE — Telephone Encounter (Signed)
Ok thanks 

## 2016-01-30 NOTE — Telephone Encounter (Signed)
She came in today, she was under the impression her appt was today.  Just fyi she did not internally miss the appt.  We were going to rec but it was a little ways out.

## 2016-01-30 NOTE — Telephone Encounter (Signed)
Pt came in and said that she as spoke to about some stomach issue she has been having and would like to see if you could put in a referral to GI upstairs.  She said that she has had digustive issues for years.

## 2016-01-30 NOTE — Telephone Encounter (Signed)
Ok, fine. 

## 2016-01-30 NOTE — Telephone Encounter (Signed)
She would need visit to discuss referral and no showed for visit yesterday. Can reschedule.

## 2016-02-26 ENCOUNTER — Ambulatory Visit (INDEPENDENT_AMBULATORY_CARE_PROVIDER_SITE_OTHER): Payer: Commercial Managed Care - HMO | Admitting: Internal Medicine

## 2016-02-26 ENCOUNTER — Encounter: Payer: Self-pay | Admitting: Internal Medicine

## 2016-02-26 VITALS — BP 130/72 | HR 71 | Temp 97.8°F | Resp 18 | Ht 60.0 in | Wt 150.0 lb

## 2016-02-26 DIAGNOSIS — Z23 Encounter for immunization: Secondary | ICD-10-CM

## 2016-02-26 DIAGNOSIS — K219 Gastro-esophageal reflux disease without esophagitis: Secondary | ICD-10-CM | POA: Diagnosis not present

## 2016-02-26 MED ORDER — FAMOTIDINE 20 MG PO TABS: 20.0000 mg | ORAL_TABLET | Freq: Every day | ORAL | 6 refills | Status: DC

## 2016-02-26 NOTE — Patient Instructions (Signed)
We have sent in pepcid that you can take in the evening to help more with the symptoms.   We have given you the flu shot today.  We will get you in with the GI doctor.

## 2016-02-26 NOTE — Progress Notes (Signed)
   Subjective:    Patient ID: [name], female    DOB: Dec 17, 1945, 70 y.o.   MRN: [id number]  HPI The patient is a 70 YO female coming in for GI concerns. She has had these for some time. She describes it more as GERD. She does take 2 OTC nexium daily. She still has some gas and bloating even with this medicine. She has been on several other agents in the past which did not do as well. She cannot recall the names of them. She has also tried zantac in the past and did not feel that it helped. She does try to avoid foods that flare her but sometimes eats them. Denies diarrhea or constipation. Had endoscopy back in 2013.   Review of Systems  Constitutional: Negative for activity change, appetite change, fatigue, fever and unexpected weight change.  Respiratory: Negative for cough, chest tightness, shortness of breath and wheezing.   Cardiovascular: Negative for chest pain, palpitations and leg swelling.  Gastrointestinal: Positive for abdominal distention. Negative for abdominal pain, constipation, diarrhea and nausea.       GERD symptoms  Musculoskeletal: Positive for arthralgias. Negative for myalgias.      Objective:   Physical Exam  Constitutional: She is oriented to person, place, and time. She appears well-developed and well-nourished.  Overweight  HENT:  Head: Normocephalic and atraumatic.  Right Ear: External ear normal.  Left Ear: External ear normal.  Eyes: EOM are normal.  Neck: Normal range of motion.  Cardiovascular: Normal rate and regular rhythm.   Pulmonary/Chest: Effort normal and breath sounds normal. No respiratory distress. She has no wheezes. She has no rales.  Abdominal: Soft. Bowel sounds are normal. She exhibits no distension and no mass. There is no tenderness. There is no rebound and no guarding.  Neurological: She is alert and oriented to person, place, and time. Coordination normal.  Skin: Skin is warm and dry.   Vitals:   02/26/16 0930  BP: 130/72    Pulse: 71  Resp: 18  Temp: 97.8 F (36.6 C)  TempSrc: Oral  SpO2: 98%  Weight: 150 lb (68 kg)  Height: 5' (1.524 m)      Assessment & Plan:  High dose flu shot given at visit.

## 2016-02-26 NOTE — Assessment & Plan Note (Signed)
Will refer to GI, S/P esophageal dilation in 2013 by Dr. Sharlett Iles. Taking nexium OTC (40 mg) and will add pepcid qhs. Talked to her about avoiding foods that bother the GERD.

## 2016-02-26 NOTE — Progress Notes (Signed)
Pre visit review using our clinic review tool, if applicable. No additional management support is needed unless otherwise documented below in the visit note. 

## 2016-02-28 ENCOUNTER — Telehealth: Payer: Self-pay | Admitting: Internal Medicine

## 2016-02-28 NOTE — Telephone Encounter (Signed)
[facility]    --------------------------------------------------------------------------------   Patient Name: [name]  Gender: Female  DOB: [birth year]   Age: 70 Y 34 M 24 D  Return Phone Number: [phone] (Primary), [phone] (Secondary)  Address:     City/State/Zip:  [place]     Client [facility] - Client  Client Site [facility] - Day  Physician Pricilla Holm- MD  Contact Type Call  Who Is Calling Patient / Member / Family / Caregiver  Call Type Triage / Clinical  Relationship To Patient Self  Return Phone Number [phone] (Primary)  Chief Complaint Neck Pain  Reason for Call Symptomatic / Request for [place] states she had a flu shot on Wednesday in the left arm and now is having soreness in the neck area.   Appointment Disposition EMR Appointment Not Necessary  Info pasted into Epic Yes  PreDisposition Go to Urgent Care/Walk-In Clinic  Translation No       Nurse Assessment  Nurse: Venetia Maxon, RN, Manuela Schwartz Date/Time (Eastern Time): 02/28/2016 2:55:29 PM  Confirm and document reason for call. If symptomatic, describe symptoms. You must click the next button to save text entered. ---Caller states she had a flu shot on Wednesday 02/26/16 in the left arm and now is having soreness in the neck area. Pain is really soreness and 5/10 is on left side toward chin. She even had trouble getting up from her bed. She took Tylenol night. No fever or chills.    Has the patient traveled out of the country within the last 30 days? ---No    Does the patient have any new or worsening symptoms? ---Yes    Will a triage be completed? ---Yes    Related visit to physician within the last 2 weeks? ---No    Does the PT have any chronic conditions? (i.e. diabetes, asthma, etc.) ---Yes    List chronic conditions. ---cholesterol,  fluid pill. HCTZ esophageal reflux, takes Nexium and RX pepcid. new med    Is this a behavioral health or substance abuse call? ---No           Guidelines          Guideline Title Affirmed Question Affirmed Notes Nurse Date/Time (Eastern Time)  Immunization Reactions [1] Pain, tenderness, or swelling at the injection site AND [2] persists > 3 days    Foster Simpson 02/28/2016 2:58:08 PM    Disp. Time Eilene Ghazi Time) Disposition Final User    02/28/2016 2:21:11 PM Send To Clinical Follow Up Yehuda Budd, RNVaughan Basta    02/28/2016 2:53:15 PM Send To RN Personal   Venetia Maxon, RN, Manuela Schwartz      02/28/2016 3:03:52 PM See PCP When Office is Open (within 3 days) Yes Venetia Maxon, RN, Edwena Bunde Understands: Yes  Disagree/Comply: Comply       Care Advice Given Per Guideline        SEE PCP WITHIN 3 DAYS: CALL BACK IF: * You become worse. * Fever occurs CARE ADVICE given per Immunization Reactions (Adult) guideline. LOCAL HEAT: Apply warm wet washcloth or a heating pad for 20 minutes four times a day for pain relief. * Acetaminophen is thought to be safer than ibuprofen or naproxen for people over 86 years old. Acetaminophen is in many OTC and prescription medicines. It  might be in more than one medicine that you are taking. You need to be careful and not take an overdose. An acetaminophen overdose can hurt the liver.        --------------------------------------------------------------------------------            Referrals  [facility]

## 2016-02-29 ENCOUNTER — Ambulatory Visit (INDEPENDENT_AMBULATORY_CARE_PROVIDER_SITE_OTHER): Payer: Commercial Managed Care - HMO | Admitting: Family Medicine

## 2016-02-29 ENCOUNTER — Encounter: Payer: Self-pay | Admitting: Family Medicine

## 2016-02-29 DIAGNOSIS — M791 Myalgia: Secondary | ICD-10-CM

## 2016-02-29 DIAGNOSIS — M7918 Myalgia, other site: Secondary | ICD-10-CM

## 2016-02-29 DIAGNOSIS — M25512 Pain in left shoulder: Secondary | ICD-10-CM | POA: Insufficient documentation

## 2016-02-29 MED ORDER — TRAMADOL HCL 50 MG PO TABS: 50.0000 mg | ORAL_TABLET | Freq: Three times a day (TID) | ORAL | 0 refills | Status: DC | PRN

## 2016-02-29 NOTE — Assessment & Plan Note (Signed)
New acute problem. MSK in origin.  No evidence of adverse reaction to flu vaccine. Treating with tramadol.

## 2016-02-29 NOTE — Progress Notes (Signed)
Pre visit review using our clinic review tool, if applicable. No additional management support is needed unless otherwise documented below in the visit note. 

## 2016-02-29 NOTE — Patient Instructions (Signed)
Use the medication as directed.  Be sure to move around to help with the soreness/pain.  Warm compress is fine.  Follow up as needed.   Take care  Dr. Lacinda Axon

## 2016-02-29 NOTE — Progress Notes (Signed)
Subjective:  Patient ID: [name], female    DOB: 1946/04/12  Age: 70 y.o. MRN: [id number]  CC: L arm/shoulder/neck pain  HPI:  70 year old female presents with the above complaints.  Patient states that she developed pain in her arm/shoulder on Wednesday. She states that it began after she had a flu shot. She states she's also having pain in her posterior and left side of the neck. Decreased range of motion with left rotation. She reports associated pain. Pain is moderate in severity. She's taken over-the-counter Tylenol and use hot compresses with little relief. Exacerbated by movement. No other associated symptoms. No other complaints.   Social Hx   Social History   Social History  . Marital status: Married    Spouse name: N/A  . Number of children: N/A  . Years of education: N/A   Social History Main Topics  . Smoking status: Never Smoker  . Smokeless tobacco: Never Used  . Alcohol use No     Comment: no  . Drug use: No  . Sexual activity: Not Currently    Birth control/ protection: Surgical   Other Topics Concern  . None   Social History Narrative   Married '68. 2 son - '69, '70; 1 son - '71: 2 grandchildren. 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    Review of Systems  Constitutional: Negative.   Musculoskeletal: Positive for myalgias and neck pain.   Objective:  BP 120/76 (BP Location: Right Arm, Patient Position: Sitting, Cuff Size: Normal)   Pulse 72   Temp 98.3 F (36.8 C) (Oral)   Resp 20   Ht 5' (1.524 m)   Wt 149 lb (67.6 kg)   SpO2 98%   BMI 29.10 kg/m   BP/Weight 02/29/2016 02/26/2016 [id number]  Systolic BP [id number]  Diastolic BP 76 72 78  Wt. (Lbs) 149 150 151  BMI 29.1 29.29 29.49   Physical Exam  Constitutional: She appears well-developed. No distress.  Cardiovascular: Normal rate and regular rhythm.   Pulmonary/Chest: Effort normal. She has no wheezes. She has no rales.  Musculoskeletal:  Left arm - no redness or evidence  of local reaction from flu vaccine. Decreased range of motion of the shoulder secondary to pain. Tenderness and tightness of the left trapezius muscle. Decreased range of motion of the neck and left rotation.  Neurological: She is alert.  Psychiatric: She has a normal mood and affect.  Vitals reviewed.  Lab Results  Component Value Date   WBC 6.4 02/22/2015   HGB 13.9 02/22/2015   HCT 41.8 02/22/2015   PLT 313.0 02/22/2015   GLUCOSE 92 11/15/2015   CHOL 214 (H) 11/15/2015   TRIG 163.0 (H) 11/15/2015   HDL 48.50 11/15/2015   LDLDIRECT 175.3 04/24/2008   LDLCALC 133 (H) 11/15/2015   ALT 27 11/15/2015   AST 26 11/15/2015   NA 140 11/15/2015   K 3.7 11/15/2015   CL 102 11/15/2015   CREATININE 0.69 11/15/2015   BUN 15 11/15/2015   CO2 30 11/15/2015   TSH 1.87 01/22/2014   HGBA1C 6.2 05/03/2015    Assessment & Plan:   Problem List Items Addressed This Visit    Musculoskeletal pain    New acute problem. MSK in origin.  No evidence of adverse reaction to flu vaccine. Treating with tramadol.       Other Visit Diagnoses   None.     Meds ordered this encounter  Medications  . traMADol (ULTRAM) 50 MG tablet  Sig: Take 1 tablet (50 mg total) by mouth every 8 (eight) hours as needed.    Dispense:  30 tablet    Refill:  0    Follow-up: PRN  JAARS

## 2016-03-25 ENCOUNTER — Other Ambulatory Visit: Payer: Self-pay | Admitting: Internal Medicine

## 2016-03-31 ENCOUNTER — Ambulatory Visit (HOSPITAL_COMMUNITY)
Admission: EM | Admit: 2016-03-31 | Discharge: 2016-03-31 | Disposition: A | Discharge status: Home / Self Care | Payer: Commercial Managed Care - HMO | Attending: Family Medicine | Admitting: Family Medicine

## 2016-03-31 ENCOUNTER — Encounter (HOSPITAL_COMMUNITY): Payer: Self-pay | Admitting: Emergency Medicine

## 2016-03-31 DIAGNOSIS — S0093XA Contusion of unspecified part of head, initial encounter: Secondary | ICD-10-CM | POA: Diagnosis not present

## 2016-03-31 NOTE — ED Provider Notes (Signed)
[name]    CSN: [id number] Arrival date & time: 03/31/16  1848     History   Chief Complaint No chief complaint on file.   HPI [name] is a 70 y.o. female.   The history is provided by the patient.  Head Injury  Location:  Frontal Time since incident:  6 hours Mechanism of injury: direct blow and fall   Fall:    Fall occurred: in a hurry to get grdaughter and foot caught on a box and fell forward onto knees and hit head against clothes hamper,   Point of impact:  Knees   Entrapped after fall: no   Pain details:    Quality:  Sharp   Severity:  Mild   Progression:  Improving Chronicity:  New Relieved by:  None tried Worsened by:  Nothing Ineffective treatments:  None tried Associated symptoms: no blurred vision, no disorientation, no double vision, no headaches, no loss of consciousness, no nausea, no neck pain, no numbness, no seizures and no vomiting   Risk factors: being elderly     Past Medical History:  Diagnosis Date  . Arthritis    KNEES  . CTS (carpal tunnel syndrome)    BILATERAL LEFT > RIGHT (OCC. HAND NUMBNESS)  . Female cystocele   . GERD (gastroesophageal reflux disease)   . H/O hiatal hernia   . History of cervical dysplasia    CIN II  . History of peritonitis    2006--  S/P PERFORATED BOWEL INJURY DURING SURGERY  . Hyperlipidemia     Patient Active Problem List   Diagnosis Date Noted  . Musculoskeletal pain 02/29/2016  . Essential hypertension 10/03/2014  . Impingement syndrome of left shoulder region 10/03/2014  . Routine general medical examination at a health care facility 08/27/2014  . Elevated CK 02/02/2014  . Cystocele 03/17/2012  . Impaired fasting glucose 02/17/2012  . CARPAL TUNNEL SYNDROME 03/17/2009  . GERD 03/17/2009  . Hyperlipidemia 02/11/2009  . Left knee pain 02/11/2009    Past Surgical History:  Procedure Laterality Date  . ABDOMINAL HYSTERECTOMY  1991   W/ BILATERAL SALPINGOOPHORECTOMY  .  LAPAROSCOPIC CHOLECYSTECTOMY  2006   W/ PERFERATED BOWEL (INJURY)  . NASAL SEPTUM SURGERY  70's   REPAIR DEVIATED SEPTUM  . TUBAL LIGATION  1980'S  . VAGINAL PROLAPSE REPAIR N/A 08/15/2012   Procedure: VAGINAL VAULT SUSPENSION;  Surgeon: Ailene Rud, MD;  Location: [facility];  Service: Urology;  Laterality: N/A;  ANTERIOR VAULT REPAIR WITH XENFORE    OB History    No data available       Home Medications    Prior to Admission medications   Medication Sig Start Date End Date Taking? Authorizing Provider  acetaminophen (TYLENOL) 500 MG tablet Take 500-750 mg by mouth every 6 (six) hours as needed for pain.    Historical Provider, MD  Esomeprazole Magnesium (NEXIUM PO) Take by mouth daily. OTC    Historical Provider, MD  estradiol (ESTRACE) 0.1 MG/GM vaginal cream Place 2 g vaginally 3 (three) times a week.    Historical Provider, MD  famotidine (PEPCID) 20 MG tablet Take 1 tablet (20 mg total) by mouth at bedtime. 02/26/16   Hoyt Koch, MD  hydrochlorothiazide (MICROZIDE) 12.5 MG capsule TAKE 1 CAPSULE (12.5 MG TOTAL) BY MOUTH DAILY. 03/25/16   Hoyt Koch, MD  hydrocortisone (ANUSOL-HC) 2.5 % rectal cream Place 1 application rectally 2 (two) times daily. 02/22/15   Hendricks Limes, MD  Multiple Vitamin (MULTIVITAMIN) tablet Take 1 tablet by mouth every other day.     Historical Provider, MD  Omega-3 Fatty Acids (FISH OIL PO) Take by mouth.    Historical Provider, MD  OVER THE COUNTER MEDICATION Place 1 suppository rectally daily as needed (for hemmorhoids).    Historical Provider, MD  promethazine-codeine (PHENERGAN WITH CODEINE) 6.25-10 MG/5ML syrup Take 5 mLs by mouth every 6 (six) hours as needed for cough. 07/11/15   Evie Lacks Plotnikov, MD  rosuvastatin (CRESTOR) 20 MG tablet Take 1 tablet (20 mg total) by mouth daily. 05/03/15   Hoyt Koch, MD  tetrahydrozoline (VISINE) 0.05 % ophthalmic solution Place 2 drops into both eyes daily  as needed (for dry eyes).    Historical Provider, MD  traMADol (ULTRAM) 50 MG tablet Take 1 tablet (50 mg total) by mouth every 8 (eight) hours as needed. 02/29/16   Coral Spikes, DO    Family History Family History  Problem Relation Age of Onset  . Coronary artery disease Mother   . Other Mother     cva  . Hypertension Mother   . Heart disease Mother   . Other Father     CVA  . Stroke Father   . Diabetes Other   . Coronary artery disease Other   . Hypertension Other   . Stroke Other   . Cancer Other     breast  . Cancer Sister     breast  . Cancer Brother     prancreatic  . Cancer Sister     breast  . Colon cancer Neg Hx     Social History Social History  Substance Use Topics  . Smoking status: Never Smoker  . Smokeless tobacco: Never Used  . Alcohol use No     Comment: no     Allergies   Zocor [simvastatin]; Advil [ibuprofen]; Bran [wheat bran]; Hydrocodone; Hydrocodone-acetaminophen; Lipitor [atorvastatin]; and Pravachol [pravastatin sodium]   Review of Systems Review of Systems  Constitutional: Negative.   Eyes: Negative.  Negative for blurred vision and double vision.  Gastrointestinal: Negative for nausea and vomiting.  Musculoskeletal: Negative for neck pain.  Neurological: Negative for dizziness, seizures, loss of consciousness, speech difficulty, weakness, light-headedness, numbness and headaches.  All other systems reviewed and are negative.    Physical Exam Triage Vital Signs ED Triage Vitals  Enc Vitals Group     BP      Pulse      Resp      Temp      Temp src      SpO2      Weight      Height      Head Circumference      Peak Flow      Pain Score      Pain Loc      Pain Edu?      Excl. in [place]?    No data found.   Updated Vital Signs There were no vitals taken for this visit.  Visual Acuity Right Eye Distance:   Left Eye Distance:   Bilateral Distance:    Right Eye Near:   Left Eye Near:    Bilateral Near:     Physical  Exam  Constitutional: She is oriented to person, place, and time. She appears well-developed and well-nourished.  HENT:  Head: Normocephalic.  Right Ear: External ear normal.  Left Ear: External ear normal.  sm linear hematoma to right temporal forehead, no defect.  Eyes:  EOM are normal. Pupils are equal, round, and reactive to light.  Neck: Normal range of motion. Neck supple.  Cardiovascular: Normal rate.   Pulmonary/Chest: Effort normal and breath sounds normal.  Neurological: She is alert and oriented to person, place, and time. No cranial nerve deficit. Coordination normal.  Skin: Skin is warm and dry.  Nursing note and vitals reviewed.    UC Treatments / Results  Labs (all labs ordered are listed, but only abnormal results are displayed) Labs Reviewed - No data to display  EKG  EKG Interpretation None       Radiology No results found.  Procedures Procedures (including critical care time)  Medications Ordered in UC Medications - No data to display   Initial Impression / Assessment and Plan / UC Course  I have reviewed the triage vital signs and the nursing notes.  Pertinent labs & imaging results that were available during my care of the patient were reviewed by me and considered in my medical decision making (see chart for details).  Clinical Course      Final Clinical Impressions(s) / UC Diagnoses   Final diagnoses:  None    New Prescriptions New Prescriptions   No medications on file     Billy Fischer, MD 03/31/16 2026

## 2016-03-31 NOTE — Discharge Instructions (Signed)
Ice, tylenol, light diet tonight rest, return if needed or go to ER if vomiting, confusion, dizziness or other concerns develop.

## 2016-03-31 NOTE — ED Triage Notes (Signed)
Pt. Stated, I fell over some clothes on my knees 1st and hit my head. Pt has a knot on the right side of forehead.

## 2016-04-14 ENCOUNTER — Ambulatory Visit (INDEPENDENT_AMBULATORY_CARE_PROVIDER_SITE_OTHER): Payer: Commercial Managed Care - HMO | Admitting: Gastroenterology

## 2016-04-14 ENCOUNTER — Encounter: Payer: Self-pay | Admitting: Gastroenterology

## 2016-04-14 ENCOUNTER — Encounter (INDEPENDENT_AMBULATORY_CARE_PROVIDER_SITE_OTHER): Payer: Self-pay

## 2016-04-14 VITALS — BP 126/70 | HR 64 | Ht 58.5 in | Wt 149.5 lb

## 2016-04-14 DIAGNOSIS — R143 Flatulence: Secondary | ICD-10-CM | POA: Diagnosis not present

## 2016-04-14 DIAGNOSIS — K219 Gastro-esophageal reflux disease without esophagitis: Secondary | ICD-10-CM | POA: Diagnosis not present

## 2016-04-14 NOTE — Patient Instructions (Signed)
You can start phazyme over the counter four times a day as needed. Samples have been given to get you started.   You have been given a gas prevention diet.   Patient advised to avoid spicy, acidic, citrus, chocolate, mints, fruit and fruit juices.  Limit the intake of caffeine, alcohol and Soda.  Don't exercise too soon after eating.  Don't lie down within 3-4 hours of eating.  Elevate the head of your bed.  Thank you for choosing me and [facility].  Pricilla Riffle. Dagoberto Ligas., MD., Marval Regal

## 2016-04-14 NOTE — Progress Notes (Signed)
    History of Present Illness: This is a 70 year old female referred by Hoyt Koch, * for the evaluation of GERD, gas, belching, flatulence. She was previously followed by Dr. Sharlett Iles and last saw him in September 2013. Reflux symptoms have improved with the addition of famotidine at night. Currently her most bothersome complaint is gas, belching and flatulence.  EGD in 02/2012 showed a 4 cm hiatal hernia and mild nodular changes in the fundus biopsy showing mild chronic inflammation.  Colonoscopy 02/2012 was normal except for a small polypoid area with biopsies showed normal mucosa with a lymphoid aggregate  Review of Systems: Pertinent positive and negative review of systems were noted in the above HPI section. All other review of systems were otherwise negative.  Current Medications, Allergies, Past Medical History, Past Surgical History, Family History and Social History were reviewed in [employer] record.  Physical Exam: General: Well developed, well nourished, no acute distress Head: Normocephalic and atraumatic Eyes:  sclerae anicteric, EOMI Ears: Normal auditory acuity Mouth: No deformity or lesions Neck: Supple, no masses or thyromegaly Lungs: Clear throughout to auscultation Heart: Regular rate and rhythm; no murmurs, rubs or bruits Abdomen: Soft, non tender and non distended. No masses, hepatosplenomegaly or hernias noted. Normal Bowel sounds Musculoskeletal: Symmetrical with no gross deformities  Skin: No lesions on visible extremities Pulses:  Normal pulses noted Extremities: No clubbing, cyanosis, edema or deformities noted Neurological: Alert oriented x 4, grossly nonfocal Cervical Nodes:  No significant cervical adenopathy Inguinal Nodes: No significant inguinal adenopathy Psychological:  Alert and cooperative. Normal mood and affect  Assessment and Recommendations:  1. GERD, gas, flatulence, belching. Continue Nexium 40 mg daily  and Pepcid hs. Begin Phazyme prn, antireflux diet, low gas diet. Call if symptoms not improved in 1 month.   cc: Hoyt Koch, MD [address] [place], [place] [address]

## 2016-05-12 ENCOUNTER — Other Ambulatory Visit: Payer: Self-pay | Admitting: Internal Medicine

## 2016-05-12 DIAGNOSIS — Z1231 Encounter for screening mammogram for malignant neoplasm of breast: Secondary | ICD-10-CM

## 2016-05-21 ENCOUNTER — Other Ambulatory Visit: Payer: Self-pay | Admitting: Internal Medicine

## 2016-05-25 ENCOUNTER — Other Ambulatory Visit: Payer: Self-pay | Admitting: Internal Medicine

## 2016-06-03 ENCOUNTER — Encounter: Payer: Self-pay | Admitting: Primary Care

## 2016-06-03 ENCOUNTER — Ambulatory Visit (INDEPENDENT_AMBULATORY_CARE_PROVIDER_SITE_OTHER): Payer: Commercial Managed Care - HMO | Admitting: Primary Care

## 2016-06-03 VITALS — BP 144/90 | HR 66 | Temp 97.7°F | Wt 148.8 lb

## 2016-06-03 DIAGNOSIS — J069 Acute upper respiratory infection, unspecified: Secondary | ICD-10-CM

## 2016-06-03 DIAGNOSIS — B9789 Other viral agents as the cause of diseases classified elsewhere: Secondary | ICD-10-CM

## 2016-06-03 DIAGNOSIS — J029 Acute pharyngitis, unspecified: Secondary | ICD-10-CM

## 2016-06-03 LAB — POCT RAPID STREP A (OFFICE): RAPID STREP A SCREEN: NEGATIVE

## 2016-06-03 NOTE — Progress Notes (Signed)
Pre visit review using our clinic review tool, if applicable. No additional management support is needed unless otherwise documented below in the visit note. 

## 2016-06-03 NOTE — Patient Instructions (Signed)
Your symptoms are representative of a viral illness which will resolve on its own over time. Our goal is to treat your symptoms in order to aid your body in the healing process and to make you more comfortable.   Consider starting an antihistamine such as Zyrtec, Claritin, or Allegra. This will help to dry up the drainage in the back of your throat and sinuses.   Nasal Congestion: Try using Flonase (fluticasone) nasal spray. Instill 1 spray in each nostril twice daily.   Please notify me if you develop persistent fevers of 101, start coughing up green mucous, notice increased fatigue or weakness, or feel worse after 1 week of onset of symptoms.   Increase consumption of water intake and rest.  It was a pleasure meeting you!

## 2016-06-03 NOTE — Progress Notes (Signed)
Subjective:    Patient ID: [name], female    DOB: 04-15-1946, 70 y.o.   MRN: [id number]  HPI  [name] is a 70 year old female who presents today with a chief complaint of sore throat. She also reports nasal congestion, mild cough, and post nasal drip. Her symptoms began yesterday. She's been applying Vicks vapor rub and taking Alka-Selzer Plus with some improvement. She's feeling better today. She denies fevers, chills, sick contacts. She thinks she may have been getting a sinus infection.  Review of Systems  Constitutional: Negative for chills, fatigue and fever.  HENT: Positive for congestion, postnasal drip, sinus pressure and sore throat. Negative for ear pain.   Respiratory: Negative for cough and shortness of breath.   Cardiovascular: Negative for chest pain.       Past Medical History:  Diagnosis Date  . Arthritis    KNEES  . CTS (carpal tunnel syndrome)    BILATERAL LEFT > RIGHT (OCC. HAND NUMBNESS)  . Female cystocele   . GERD (gastroesophageal reflux disease)   . H/O hiatal hernia   . History of cervical dysplasia    CIN II  . History of peritonitis    2006--  S/P PERFORATED BOWEL INJURY DURING SURGERY  . Hyperlipidemia      Social History   Social History  . Marital status: Married    Spouse name: N/A  . Number of children: N/A  . Years of education: N/A   Occupational History  . Not on file.   Social History Main Topics  . Smoking status: Never Smoker  . Smokeless tobacco: Never Used  . Alcohol use No     Comment: no  . Drug use: No  . Sexual activity: Not Currently    Birth control/ protection: Surgical   Other Topics Concern  . Not on file   Social History Narrative   Married '68. 2 son - '69, '70; 1 son - '71: 2 grandchildren. 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    Past Surgical History:  Procedure Laterality Date  . ABDOMINAL HYSTERECTOMY  1991   W/ BILATERAL SALPINGOOPHORECTOMY  . LAPAROSCOPIC CHOLECYSTECTOMY   2006   W/ PERFERATED BOWEL (INJURY)  . NASAL SEPTUM SURGERY  70's   REPAIR DEVIATED SEPTUM  . TUBAL LIGATION  1980'S  . VAGINAL PROLAPSE REPAIR N/A 08/15/2012   Procedure: VAGINAL VAULT SUSPENSION;  Surgeon: Ailene Rud, MD;  Location: [facility];  Service: Urology;  Laterality: N/A;  ANTERIOR VAULT REPAIR WITH XENFORE    Family History  Problem Relation Age of Onset  . Coronary artery disease Mother   . Other Mother     cva  . Hypertension Mother   . Heart disease Mother   . Other Father     CVA  . Stroke Father   . Cancer Sister     breast  . Cancer Brother     prancreatic  . Cancer Sister     breast  . Diabetes Other   . Coronary artery disease Other   . Hypertension Other   . Stroke Other   . Cancer Other     breast  . Colon cancer Neg Hx     Allergies  Allergen Reactions  . Zocor [Simvastatin]     12/2013 CK 528  . Advil [Ibuprofen] Swelling    Tongue swells  . Bran [Wheat Bran] Other (See Comments)    Very bad GI pain  . Hydrocodone Nausea Only and Other (  See Comments)     TACHYCARDIA  . Hydrocodone-Acetaminophen Nausea Only    Other reaction(s): Increased Heart Rate (intolerance)  . Lipitor [Atorvastatin] Other (See Comments)    Bad dreams  . Pravachol [Pravastatin Sodium] Other (See Comments)    Bad dreams    Current Outpatient Prescriptions on File Prior to Visit  Medication Sig Dispense Refill  . acetaminophen (TYLENOL) 500 MG tablet Take 500-750 mg by mouth every 6 (six) hours as needed for pain.    . Esomeprazole Magnesium (NEXIUM PO) Take by mouth daily. OTC    . estradiol (ESTRACE) 0.1 MG/GM vaginal cream Place 2 g vaginally 3 (three) times a week.    . famotidine (PEPCID) 20 MG tablet Take 1 tablet (20 mg total) by mouth at bedtime. 30 tablet 6  . hydrochlorothiazide (MICROZIDE) 12.5 MG capsule TAKE 1 CAPSULE (12.5 MG TOTAL) BY MOUTH DAILY. 30 capsule 1  . Multiple Vitamin (MULTIVITAMIN) tablet Take 1 tablet by mouth  every other day.     . Omega-3 Fatty Acids (FISH OIL PO) Take by mouth.    Marland Kitchen OVER THE COUNTER MEDICATION Place 1 suppository rectally daily as needed (for hemmorhoids).    . rosuvastatin (CRESTOR) 20 MG tablet Take 1 tablet (20 mg total) by mouth daily. 90 tablet 3  . tetrahydrozoline (VISINE) 0.05 % ophthalmic solution Place 2 drops into both eyes daily as needed (for dry eyes).    . hydrocortisone (ANUSOL-HC) 2.5 % rectal cream Place 1 application rectally 2 (two) times daily. (Patient not taking: Reported on 06/03/2016) 30 g 0   No current facility-administered medications on file prior to visit.     BP (!) 144/90   Pulse 66   Temp 97.7 F (36.5 C) (Oral)   Wt 148 lb 12.8 oz (67.5 kg)   SpO2 98%   BMI 30.57 kg/m    Objective:   Physical Exam  Constitutional: She appears well-nourished. She does not appear ill.  HENT:  Right Ear: Tympanic membrane and ear canal normal.  Left Ear: Tympanic membrane and ear canal normal.  Nose: No mucosal edema. Right sinus exhibits no maxillary sinus tenderness and no frontal sinus tenderness. Left sinus exhibits no maxillary sinus tenderness and no frontal sinus tenderness.  Mouth/Throat: Oropharynx is clear and moist.  Eyes: Conjunctivae are normal.  Neck: Neck supple.  Cardiovascular: Normal rate and regular rhythm.   Pulmonary/Chest: Effort normal and breath sounds normal. She has no wheezes. She has no rales.  Lymphadenopathy:    She has no cervical adenopathy.  Skin: Skin is warm and dry.          Assessment & Plan:  Sore Throat:  Also with post nasal drip, sinus pressure.  Improvement with OTC treatment, feeling better today. Exam unremarkable. Lungs clear, throat without erythema and edema. Patient requesting strep test which was negative. Suspect sore throat secondary to PND. Could be either allergy or viral involvement. Will treat with supportive measures. Zyrtec at bedtime, continue Vicks vapor rub. Discussed use of Flonase  PRN. Fluids, rest, follow up PRN.  Sheral Flow, NP

## 2016-06-03 NOTE — Addendum Note (Signed)
Addended by: Jacqualin Combes on: 06/03/2016 05:21 PM   Modules accepted: Orders

## 2016-06-05 ENCOUNTER — Ambulatory Visit: Payer: Commercial Managed Care - HMO | Admitting: Obstetrics and Gynecology

## 2016-06-08 ENCOUNTER — Telehealth: Payer: Self-pay

## 2016-06-08 DIAGNOSIS — J069 Acute upper respiratory infection, unspecified: Secondary | ICD-10-CM

## 2016-06-08 MED ORDER — AZITHROMYCIN 250 MG PO TABS: ORAL_TABLET | ORAL | 0 refills | Status: DC

## 2016-06-08 NOTE — Telephone Encounter (Signed)
At this point I still suspect her symptoms are related to a viral infection which sometimes take 7-10 days for resolve. I have sent in an antibiotic for Azithromycin tablets. Take 2 tablets by mouth on day 1, then 1 tablet daily for 4 additional days. Please notify patient that I strongly recommend she wait to take the antibiotic until Wednesday only if she's not feeling better.

## 2016-06-08 NOTE — Telephone Encounter (Signed)
Pt left v/m; pt was seen on 06/03/16; since seen symptoms have worsened; temp is not greater than 101 but now pt has prod cough with green phlegm. Pt request med sent to [facility]. Pt request cb.

## 2016-06-09 NOTE — Telephone Encounter (Signed)
Patient notified and has already started the abx. She said she is starting to feel better.

## 2016-06-24 ENCOUNTER — Ambulatory Visit
Admission: RE | Admit: 2016-06-24 | Discharge: 2016-06-24 | Disposition: A | Discharge status: Home / Self Care | Payer: Commercial Managed Care - HMO | Source: Ambulatory Visit | Attending: Internal Medicine | Admitting: Internal Medicine

## 2016-06-24 DIAGNOSIS — Z1231 Encounter for screening mammogram for malignant neoplasm of breast: Secondary | ICD-10-CM | POA: Diagnosis not present

## 2016-06-29 ENCOUNTER — Other Ambulatory Visit: Payer: Self-pay | Admitting: Internal Medicine

## 2016-07-02 ENCOUNTER — Other Ambulatory Visit (HOSPITAL_COMMUNITY)
Admission: RE | Admit: 2016-07-02 | Discharge: 2016-07-02 | Disposition: A | Discharge status: Home / Self Care | Payer: Commercial Managed Care - HMO | Source: Ambulatory Visit | Attending: Obstetrics and Gynecology | Admitting: Obstetrics and Gynecology

## 2016-07-02 ENCOUNTER — Encounter: Payer: Self-pay | Admitting: Obstetrics and Gynecology

## 2016-07-02 ENCOUNTER — Ambulatory Visit (INDEPENDENT_AMBULATORY_CARE_PROVIDER_SITE_OTHER): Payer: Commercial Managed Care - HMO | Admitting: Obstetrics and Gynecology

## 2016-07-02 VITALS — BP 105/66 | HR 88 | Ht 59.0 in | Wt 148.0 lb

## 2016-07-02 DIAGNOSIS — Z0000 Encounter for general adult medical examination without abnormal findings: Secondary | ICD-10-CM | POA: Diagnosis not present

## 2016-07-02 DIAGNOSIS — Z01419 Encounter for gynecological examination (general) (routine) without abnormal findings: Secondary | ICD-10-CM | POA: Insufficient documentation

## 2016-07-02 MED ORDER — ESTRADIOL 0.1 MG/GM VA CREA: 2.0000 g | TOPICAL_CREAM | VAGINAL | 6 refills | Status: DC

## 2016-07-02 NOTE — Addendum Note (Signed)
Addended by: Gretchen Short on: 07/02/2016 09:46 AM   Modules accepted: Orders

## 2016-07-02 NOTE — Progress Notes (Signed)
Normal Pap 05/2015 - She does not want Pap this year - She needs refill on Estrace and samples if possible Normal MM 05/2016 Flu Vaccine 01/2016

## 2016-07-02 NOTE — Progress Notes (Signed)
Subjective:     [name] is a 71 y.o. female [phone] who is here for a comprehensive physical exam. The patient reports some vaginal pruritis which started 3 days ago. She is concerned that she may have a yeast infection. She was recently treated for a sinus infection and dental abscess. She denies any vaginal discharge. She is not sexually active. She denies any urinary incontinence.  Past Medical History:  Diagnosis Date  . Arthritis    KNEES  . CTS (carpal tunnel syndrome)    BILATERAL LEFT > RIGHT (OCC. HAND NUMBNESS)  . Female cystocele   . GERD (gastroesophageal reflux disease)   . H/O hiatal hernia   . History of cervical dysplasia    CIN II  . History of peritonitis    2006--  S/P PERFORATED BOWEL INJURY DURING SURGERY  . Hyperlipidemia    Past Surgical History:  Procedure Laterality Date  . ABDOMINAL HYSTERECTOMY  1991   W/ BILATERAL SALPINGOOPHORECTOMY  . LAPAROSCOPIC CHOLECYSTECTOMY  2006   W/ PERFERATED BOWEL (INJURY)  . NASAL SEPTUM SURGERY  70's   REPAIR DEVIATED SEPTUM  . TUBAL LIGATION  1980'S  . VAGINAL PROLAPSE REPAIR N/A 08/15/2012   Procedure: VAGINAL VAULT SUSPENSION;  Surgeon: Ailene Rud, MD;  Location: [facility];  Service: Urology;  Laterality: N/A;  ANTERIOR VAULT REPAIR WITH XENFORE   Family History  Problem Relation Age of Onset  . Coronary artery disease Mother   . Other Mother     cva  . Hypertension Mother   . Heart disease Mother   . Other Father     CVA  . Stroke Father   . Cancer Sister 49    breast  . Cancer Brother     prancreatic  . Cancer Sister 8    breast  . Diabetes Other   . Coronary artery disease Other   . Hypertension Other   . Stroke Other   . Cancer Other     breast  . Colon cancer Neg Hx     Social History   Social History  . Marital status: Married    Spouse name: N/A  . Number of children: N/A  . Years of education: N/A   Occupational History  . Not on file.   Social  History Main Topics  . Smoking status: Never Smoker  . Smokeless tobacco: Never Used  . Alcohol use No     Comment: no  . Drug use: No  . Sexual activity: Not Currently    Birth control/ protection: Surgical   Other Topics Concern  . Not on file   Social History Narrative   Married '68. 2 son - '69, '70; 1 son - '71: 2 grandchildren. 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   Health Maintenance  Topic Date Due  . ZOSTAVAX  10/04/2005  . MAMMOGRAM  06/24/2018  . TETANUS/TDAP  11/18/2020  . COLONOSCOPY  03/14/2022  . INFLUENZA VACCINE  Completed  . DEXA SCAN  Completed  . Hepatitis C Screening  Completed  . PNA vac Low Risk Adult  Completed     Review of Systems Pertinent items are noted in HPI.   Objective:  Blood pressure 105/66, pulse 88, height 4\' 11"  (1.499 m), weight 148 lb (67.1 kg).     GENERAL: Well-developed, well-nourished female in no acute distress.  HEENT: Normocephalic, atraumatic. Sclerae anicteric.  NECK: Supple. Normal thyroid.  LUNGS: Clear to auscultation bilaterally.  HEART: Regular rate and rhythm. BREASTS: Symmetric  in size. No palpable masses or lymphadenopathy, skin changes, or nipple drainage. ABDOMEN: Soft, nontender, nondistended. No organomegaly. PELVIC: Normal external female genitalia. Vagina is pink.  Normal discharge. No adnexal mass or tenderness. EXTREMITIES: No cyanosis, clubbing, or edema, 2+ distal pulses.    Assessment:    Healthy female exam.      Plan:    Wet prep collected Patient will have labs done with PCP Normal mammogram in 05/2016 RTC in 1 year or prn Patient will be contacted with any abnormal results See After Visit Summary for Counseling Recommendations

## 2016-07-03 LAB — CERVICOVAGINAL ANCILLARY ONLY
Bacterial vaginitis: NEGATIVE
CANDIDA VAGINITIS: NEGATIVE
TRICH (WINDOWPATH): NEGATIVE

## 2016-07-26 ENCOUNTER — Encounter (HOSPITAL_COMMUNITY): Payer: Self-pay | Admitting: Oncology

## 2016-07-26 ENCOUNTER — Emergency Department (HOSPITAL_COMMUNITY)
Admission: EM | Admit: 2016-07-26 | Discharge: 2016-07-27 | Disposition: A | Discharge status: Home / Self Care | Payer: Medicare HMO | Attending: Emergency Medicine | Admitting: Emergency Medicine

## 2016-07-26 DIAGNOSIS — L509 Urticaria, unspecified: Secondary | ICD-10-CM | POA: Diagnosis not present

## 2016-07-26 DIAGNOSIS — I10 Essential (primary) hypertension: Secondary | ICD-10-CM | POA: Diagnosis not present

## 2016-07-26 MED ORDER — DIPHENHYDRAMINE HCL 25 MG PO CAPS
25.0000 mg | ORAL_CAPSULE | Freq: Once | ORAL | Status: AC
  Administered 2016-07-26: 25 mg via ORAL
  Filled 2016-07-26: qty 1

## 2016-07-26 MED ORDER — PREDNISONE 20 MG PO TABS
60.0000 mg | ORAL_TABLET | Freq: Once | ORAL | Status: AC
  Administered 2016-07-26: 60 mg via ORAL
  Filled 2016-07-26: qty 3

## 2016-07-26 MED ORDER — PREDNISONE 50 MG PO TABS: ORAL_TABLET | ORAL | 0 refills | Status: DC

## 2016-07-26 NOTE — ED Provider Notes (Signed)
[place] DEPT Provider Note   CSN: [id number] Arrival date & time: 07/26/16  2311  By signing my name below, I, Neta Mends, attest that this documentation has been prepared under the direction and in the presence of Ripley Fraise, MD . Electronically Signed: Neta Mends, ED Scribe. 07/26/2016. 11:34 PM.    History   Chief Complaint Chief Complaint  Patient presents with  . Urticaria    The history is provided by the patient. No language interpreter was used.  Urticaria  This is a new problem. The current episode started 6 to 12 hours ago. The problem occurs constantly. The problem has been gradually worsening. Pertinent negatives include no chest pain, no abdominal pain, no headaches and no shortness of breath. Nothing aggravates the symptoms. Nothing relieves the symptoms. She has tried nothing for the symptoms. The treatment provided no relief.   HPI Comments:  [name] is a 71 y.o. female who presents to the Emergency Department complaining of a gradually worsening, upper body rash that began earlier this afternoon. Pt states that the rash became itchy at first on her back and has now spread to the rest of her torso, arms and face. Pt denies using any new medications, soaps, detergents. No alleviating factors noted. Pt denies any hx of previous similar rashes. Pt denies tongue swelling, difficulty swallowing, vomiting, diarrhea. .  Past Medical History:  Diagnosis Date  . Arthritis    KNEES  . CTS (carpal tunnel syndrome)    BILATERAL LEFT > RIGHT (OCC. HAND NUMBNESS)  . Female cystocele   . GERD (gastroesophageal reflux disease)   . H/O hiatal hernia   . History of cervical dysplasia    CIN II  . History of peritonitis    2006--  S/P PERFORATED BOWEL INJURY DURING SURGERY  . Hyperlipidemia     Patient Active Problem List   Diagnosis Date Noted  . Musculoskeletal pain 02/29/2016  . Essential hypertension 10/03/2014  . Impingement syndrome of  left shoulder region 10/03/2014  . Routine general medical examination at a health care facility 08/27/2014  . Elevated CK 02/02/2014  . Cystocele 03/17/2012  . Impaired fasting glucose 02/17/2012  . CARPAL TUNNEL SYNDROME 03/17/2009  . GERD 03/17/2009  . Hyperlipidemia 02/11/2009  . Left knee pain 02/11/2009    Past Surgical History:  Procedure Laterality Date  . ABDOMINAL HYSTERECTOMY  1991   W/ BILATERAL SALPINGOOPHORECTOMY  . LAPAROSCOPIC CHOLECYSTECTOMY  2006   W/ PERFERATED BOWEL (INJURY)  . NASAL SEPTUM SURGERY  70's   REPAIR DEVIATED SEPTUM  . TUBAL LIGATION  1980'S  . VAGINAL PROLAPSE REPAIR N/A 08/15/2012   Procedure: VAGINAL VAULT SUSPENSION;  Surgeon: Ailene Rud, MD;  Location: [facility];  Service: Urology;  Laterality: N/A;  ANTERIOR VAULT REPAIR WITH XENFORE    OB History    Gravida Para Term Preterm AB Living   3 3 2 1  0 3   SAB TAB Ectopic Multiple Live Births   0 0 0 0 3       Home Medications    Prior to Admission medications   Medication Sig Start Date End Date Taking? Authorizing Provider  acetaminophen (TYLENOL) 500 MG tablet Take 500-750 mg by mouth every 6 (six) hours as needed for pain.    Historical Provider, MD  amoxicillin (AMOXIL) 500 MG capsule TAKE ONE CAPSULE BY MOUTH EVERY 8 HOURS UNTIL FINISHED 06/16/16   Historical Provider, MD  Esomeprazole Magnesium (NEXIUM PO) Take by mouth daily.  OTC    Historical Provider, MD  estradiol (ESTRACE) 0.1 MG/GM vaginal cream Place 2 g vaginally 3 (three) times a week. 07/03/16   Peggy Constant, MD  famotidine (PEPCID) 20 MG tablet Take 1 tablet (20 mg total) by mouth at bedtime. 02/26/16   Hoyt Koch, MD  hydrochlorothiazide (MICROZIDE) 12.5 MG capsule TAKE 1 CAPSULE (12.5 MG TOTAL) BY MOUTH DAILY. 05/26/16   Hoyt Koch, MD  hydrocortisone (ANUSOL-HC) 2.5 % rectal cream Place 1 application rectally 2 (two) times daily. 02/22/15   Hendricks Limes, MD  Multiple  Vitamin (MULTIVITAMIN) tablet Take 1 tablet by mouth every other day.     Historical Provider, MD  Omega-3 Fatty Acids (FISH OIL PO) Take by mouth.    Historical Provider, MD  OVER THE COUNTER MEDICATION Place 1 suppository rectally daily as needed (for hemmorhoids).    Historical Provider, MD  rosuvastatin (CRESTOR) 20 MG tablet TAKE 1 TABLET (20 MG TOTAL) BY MOUTH DAILY. 06/30/16   Hoyt Koch, MD  tetrahydrozoline (VISINE) 0.05 % ophthalmic solution Place 2 drops into both eyes daily as needed (for dry eyes).    Historical Provider, MD    Family History Family History  Problem Relation Age of Onset  . Coronary artery disease Mother   . Other Mother     cva  . Hypertension Mother   . Heart disease Mother   . Other Father     CVA  . Stroke Father   . Cancer Sister 44    breast  . Cancer Brother     prancreatic  . Cancer Sister 67    breast  . Diabetes Other   . Coronary artery disease Other   . Hypertension Other   . Stroke Other   . Cancer Other     breast  . Colon cancer Neg Hx     Social History Social History  Substance Use Topics  . Smoking status: Never Smoker  . Smokeless tobacco: Never Used  . Alcohol use No     Comment: no     Allergies   Zocor [simvastatin]; Advil [ibuprofen]; Bran [wheat bran]; Hydrocodone; Hydrocodone-acetaminophen; Lipitor [atorvastatin]; and Pravachol [pravastatin sodium]   Review of Systems Review of Systems  HENT: Negative for facial swelling and trouble swallowing.   Respiratory: Negative for shortness of breath.   Cardiovascular: Negative for chest pain.  Gastrointestinal: Negative for abdominal pain, diarrhea and vomiting.  Skin: Positive for rash.  Neurological: Negative for headaches.     Physical Exam Updated Vital Signs BP 138/69 (BP Location: Left Arm)   Pulse 84   Temp 98.1 F (36.7 C) (Oral)   Resp 18   SpO2 96%   Physical Exam CONSTITUTIONAL: Well developed/well nourished HEAD:  Normocephalic/atraumatic EYES: EOMI/PERRL ENMT: Mucous membranes moist; no angioedema  NECK: supple no meningeal signs SPINE/BACK:entire spine nontender CV: S1/S2 noted, no murmurs/rubs/gallops noted LUNGS: Lungs are clear to auscultation bilaterally, no apparent distress ABDOMEN: soft, nontender, no rebound or guarding, bowel sounds noted throughout abdomen GU:no cva tenderness NEURO: Pt is awake/alert/appropriate, moves all extremitiesx4.  No facial droop.   EXTREMITIES: pulses normal/equal, full ROM SKIN: warm, color normal; scattered areas of urticaria noted to torso and arms.  PSYCH: no abnormalities of mood noted, alert and oriented to situation   ED Treatments / Results  DIAGNOSTIC STUDIES:  Oxygen Saturation is 96% on RA, normal by my interpretation.    COORDINATION OF CARE:  11:31 PM Will prescribe prednisone and benadryl. Discussed treatment plan  with pt at bedside and pt agreed to plan.   Labs (all labs ordered are listed, but only abnormal results are displayed) Labs Reviewed - No data to display  EKG  EKG Interpretation None       Radiology No results found.  Procedures Procedures (including critical care time)  Medications Ordered in ED Medications  diphenhydrAMINE (BENADRYL) capsule 25 mg (25 mg Oral Given 07/26/16 2356)  predniSONE (DELTASONE) tablet 60 mg (60 mg Oral Given 07/26/16 2356)     Initial Impression / Assessment and Plan / ED Course  I have reviewed the triage vital signs and the nursing notes.     Pt stable Allergic rxn confined to skin No angioedema No wheezing Will d/c home  Final Clinical Impressions(s) / ED Diagnoses   Final diagnoses:  Urticaria    New Prescriptions Discharge Medication List as of 07/26/2016 11:43 PM    START taking these medications   Details  predniSONE (DELTASONE) 50 MG tablet 1 tablet PO QD X4 days, Print      I personally performed the services described in this documentation, which was  scribed in my presence. The recorded information has been reviewed and is accurate.        Ripley Fraise, MD 07/27/16 [phone]

## 2016-07-26 NOTE — ED Triage Notes (Signed)
Pt has hives to her arms and torso.  Denies any changes in foods, detergents, soaps or perfume.  Pt did not take benadryl PTA.

## 2016-07-29 ENCOUNTER — Encounter: Payer: Self-pay | Admitting: Internal Medicine

## 2016-07-29 ENCOUNTER — Ambulatory Visit (INDEPENDENT_AMBULATORY_CARE_PROVIDER_SITE_OTHER): Payer: Medicare HMO | Admitting: Internal Medicine

## 2016-07-29 ENCOUNTER — Other Ambulatory Visit (INDEPENDENT_AMBULATORY_CARE_PROVIDER_SITE_OTHER): Payer: Medicare HMO

## 2016-07-29 VITALS — BP 120/76 | HR 76 | Temp 98.5°F | Resp 14 | Ht 59.0 in | Wt 147.8 lb

## 2016-07-29 DIAGNOSIS — L509 Urticaria, unspecified: Secondary | ICD-10-CM

## 2016-07-29 DIAGNOSIS — R7301 Impaired fasting glucose: Secondary | ICD-10-CM

## 2016-07-29 LAB — HEMOGLOBIN A1C: HEMOGLOBIN A1C: 6.1 % (ref 4.6–6.5)

## 2016-07-29 NOTE — Progress Notes (Signed)
Pre visit review using our clinic review tool, if applicable. No additional management support is needed unless otherwise documented below in the visit note. 

## 2016-07-29 NOTE — Progress Notes (Signed)
   Subjective:    Patient ID: [name], female    DOB: Sep 15, 1945, 71 y.o.   MRN: [id number]  HPI The patient is a 71 YO female coming in for er visit follow up (she was in for hives after eating dinner about 1 week ago). They gave her rx for prednisone and she took the complete course. She finished yesterday. The hives are now gone. They were never on her face but were on her trunk, arms, and legs. She denies facial swelling or problems breathing or swallowing. She was eating normal foods that she always eats before the hives showed up. She had trout and vegetables before the hives came several hours later. She has never had these in the past. The hives did not scar when they healed.   Review of Systems  Constitutional: Negative for activity change, appetite change, diaphoresis, fatigue, fever and unexpected weight change.  Respiratory: Negative.   Cardiovascular: Negative.   Gastrointestinal: Negative.   Musculoskeletal: Negative.   Skin: Positive for rash. Negative for color change, pallor and wound.       Now resolved      Objective:   Physical Exam  Constitutional: She is oriented to person, place, and time. She appears well-developed and well-nourished.  HENT:  Head: Normocephalic and atraumatic.  Mouth/Throat: Oropharynx is clear and moist.  Eyes: EOM are normal.  Neck: Normal range of motion.  Cardiovascular: Normal rate and regular rhythm.   Pulmonary/Chest: Effort normal and breath sounds normal.  Abdominal: Soft. She exhibits no distension. There is no tenderness. There is no rebound.  Musculoskeletal: She exhibits no edema.  Neurological: She is alert and oriented to person, place, and time.  Skin: Skin is warm and dry.   Vitals:   07/29/16 1042  BP: 120/76  Pulse: 76  Resp: 14  Temp: 98.5 F (36.9 C)  TempSrc: Oral  SpO2: 99%  Weight: 147 lb 12 oz (67 kg)  Height: 4\' 11"  (1.499 m)      Assessment & Plan:

## 2016-07-29 NOTE — Patient Instructions (Addendum)
We will have you take the last prednisone pill.   We are checking the labs today.

## 2016-07-31 DIAGNOSIS — L509 Urticaria, unspecified: Secondary | ICD-10-CM | POA: Insufficient documentation

## 2016-07-31 NOTE — Assessment & Plan Note (Signed)
She does not know the trigger as the foods were all typical for her. She declines allergy referral at this time for testing due to cost. If she has recurrent hives would recommend. No epi-pen prescribed today as she did not have an anaphylactic reaction and no facial swelling or changes.

## 2016-08-01 ENCOUNTER — Other Ambulatory Visit: Payer: Self-pay | Admitting: Internal Medicine

## 2016-09-21 ENCOUNTER — Ambulatory Visit (INDEPENDENT_AMBULATORY_CARE_PROVIDER_SITE_OTHER): Payer: Medicare HMO | Admitting: Family

## 2016-09-21 ENCOUNTER — Encounter: Payer: Self-pay | Admitting: Family

## 2016-09-21 VITALS — BP 130/82 | HR 81 | Temp 98.4°F | Resp 16 | Ht 59.0 in | Wt 145.4 lb

## 2016-09-21 DIAGNOSIS — K2100 Gastro-esophageal reflux disease with esophagitis, without bleeding: Secondary | ICD-10-CM

## 2016-09-21 NOTE — Patient Instructions (Addendum)
Thank you for choosing [facility].  SUMMARY AND INSTRUCTIONS:  May increase your Pepcid to 20 mg twice daily.  Continue Nexium.   Follow GERD related diet.  Continue Phazeyme.  Medication:  Your prescription(s) have been submitted to your pharmacy or been printed and provided for you. Please take as directed and contact our office if you believe you are having problem(s) with the medication(s) or have any questions.  Follow up:  If your symptoms worsen or fail to improve, please contact our office for further instruction, or in case of emergency go directly to the emergency room at the closest medical facility.    Food Choices for Gastroesophageal Reflux Disease, Adult When you have gastroesophageal reflux disease (GERD), the foods you eat and your eating habits are very important. Choosing the right foods can help ease your discomfort. What guidelines do I need to follow?  Choose fruits, vegetables, whole grains, and low-fat dairy products.  Choose low-fat meat, fish, and poultry.  Limit fats such as oils, salad dressings, butter, nuts, and avocado.  Keep a food diary. This helps you identify foods that cause symptoms.  Avoid foods that cause symptoms. These may be different for everyone.  Eat small meals often instead of 3 large meals a day.  Eat your meals slowly, in a place where you are relaxed.  Limit fried foods.  Cook foods using methods other than frying.  Avoid drinking alcohol.  Avoid drinking large amounts of liquids with your meals.  Avoid bending over or lying down until 2-3 hours after eating. What foods are not recommended? These are some foods and drinks that may make your symptoms worse: Vegetables  Tomatoes. Tomato juice. Tomato and spaghetti sauce. Chili peppers. Onion and garlic. Horseradish. Fruits  Oranges, grapefruit, and lemon (fruit and juice). Meats  High-fat meats, fish, and poultry. This includes hot dogs, ribs, ham, sausage,  salami, and bacon. Dairy  Whole milk and chocolate milk. Sour cream. Cream. Butter. Ice cream. Cream cheese. Drinks  Coffee and tea. Bubbly (carbonated) drinks or energy drinks. Condiments  Hot sauce. Barbecue sauce. Sweets/Desserts  Chocolate and cocoa. Donuts. Peppermint and spearmint. Fats and Oils  High-fat foods. This includes [place] fries and potato chips. Other  Vinegar. Strong spices. This includes black pepper, white pepper, red pepper, cayenne, curry powder, cloves, ginger, and chili powder. The items listed above may not be a complete list of foods and drinks to avoid. Contact your dietitian for more information.  This information is not intended to replace advice given to you by your health care provider. Make sure you discuss any questions you have with your health care provider. Document Released: 12/15/2011 Document Revised: 11/21/2015 Document Reviewed: 04/19/2013 Elsevier Interactive Patient Education  2017 [employer].

## 2016-09-21 NOTE — Progress Notes (Signed)
Subjective:    Patient ID: [name], female    DOB: 1946-01-24, 71 y.o.   MRN: [id number]  Chief Complaint  Patient presents with  . Chest Pain    feels a pain in the center of her chest that she feels on and off     HPI:  [name] is a 71 y.o. female who  has a past medical history of Arthritis; CTS (carpal tunnel syndrome); Female cystocele; GERD (gastroesophageal reflux disease); H/O hiatal hernia; History of cervical dysplasia; History of peritonitis; and Hyperlipidemia. and presents today for an acute office visit.  This is a new problem. Associated symptom of pain located in the center of her chest has been waxing and waning for about 1 week. Pain is described as an aggrevating discomfort. Denies any attempted treatments or modifying factors. She has noted that she is belching more often. Denies any chest pain with activity. Episodes occurred when sitting still. Denies PND, heart palpitations, or shortness of breath. No abdominal pain, nausea, vomiting, diarrhea or constipation. Currently on esomeprazole and famotidine for GERD and increased gas levels. Most recently seen by Gastroenterology in October 2017 and diagnosed with GERD with instructions to follow up if symptoms worsen.   Allergies  Allergen Reactions  . Zocor [Simvastatin]     12/2013 CK 528  . Advil [Ibuprofen] Swelling    Tongue swells  . Bran [Wheat Bran] Other (See Comments)    Very bad GI pain  . Hydrocodone Nausea Only and Other (See Comments)     TACHYCARDIA  . Hydrocodone-Acetaminophen Nausea Only    Other reaction(s): Increased Heart Rate (intolerance)  . Lipitor [Atorvastatin] Other (See Comments)    Bad dreams  . Pravachol [Pravastatin Sodium] Other (See Comments)    Bad dreams      Outpatient Medications Prior to Visit  Medication Sig Dispense Refill  . acetaminophen (TYLENOL) 500 MG tablet Take 500-750 mg by mouth every 6 (six) hours as needed for pain.    . Esomeprazole Magnesium  (NEXIUM PO) Take by mouth daily. OTC    . estradiol (ESTRACE) 0.1 MG/GM vaginal cream Place 2 g vaginally 3 (three) times a week. 42.5 g 6  . famotidine (PEPCID) 20 MG tablet Take 1 tablet (20 mg total) by mouth at bedtime. 30 tablet 6  . hydrochlorothiazide (MICROZIDE) 12.5 MG capsule TAKE 1 CAPSULE (12.5 MG TOTAL) BY MOUTH DAILY. 30 capsule 1  . Multiple Vitamin (MULTIVITAMIN) tablet Take 1 tablet by mouth every other day.     . Omega-3 Fatty Acids (FISH OIL PO) Take by mouth.    Marland Kitchen OVER THE COUNTER MEDICATION Place 1 suppository rectally daily as needed (for hemmorhoids).    . rosuvastatin (CRESTOR) 20 MG tablet TAKE 1 TABLET (20 MG TOTAL) BY MOUTH DAILY. 90 tablet 2  . tetrahydrozoline (VISINE) 0.05 % ophthalmic solution Place 2 drops into both eyes daily as needed (for dry eyes).    Marland Kitchen amoxicillin (AMOXIL) 500 MG capsule TAKE ONE CAPSULE BY MOUTH EVERY 8 HOURS UNTIL FINISHED  0  . hydrochlorothiazide (MICROZIDE) 12.5 MG capsule TAKE 1 CAPSULE (12.5 MG TOTAL) BY MOUTH DAILY. 30 capsule 1  . hydrocortisone (ANUSOL-HC) 2.5 % rectal cream Place 1 application rectally 2 (two) times daily. 30 g 0  . predniSONE (DELTASONE) 50 MG tablet 1 tablet PO QD X4 days 4 tablet 0   No facility-administered medications prior to visit.       Past Surgical History:  Procedure Laterality Date  .  ABDOMINAL HYSTERECTOMY  1991   W/ BILATERAL SALPINGOOPHORECTOMY  . LAPAROSCOPIC CHOLECYSTECTOMY  2006   W/ PERFERATED BOWEL (INJURY)  . NASAL SEPTUM SURGERY  70's   REPAIR DEVIATED SEPTUM  . TUBAL LIGATION  1980'S  . VAGINAL PROLAPSE REPAIR N/A 08/15/2012   Procedure: VAGINAL VAULT SUSPENSION;  Surgeon: Ailene Rud, MD;  Location: [facility];  Service: Urology;  Laterality: N/A;  ANTERIOR VAULT REPAIR WITH XENFORE      Past Medical History:  Diagnosis Date  . Arthritis    KNEES  . CTS (carpal tunnel syndrome)    BILATERAL LEFT > RIGHT (OCC. HAND NUMBNESS)  . Female cystocele   .  GERD (gastroesophageal reflux disease)   . H/O hiatal hernia   . History of cervical dysplasia    CIN II  . History of peritonitis    2006--  S/P PERFORATED BOWEL INJURY DURING SURGERY  . Hyperlipidemia       Review of Systems  Constitutional: Negative for chills and fever.  Eyes:       Negative for changes in vision  Respiratory: Negative for cough, chest tightness and wheezing.   Cardiovascular: Positive for chest pain. Negative for palpitations and leg swelling.  Neurological: Negative for dizziness, weakness and light-headedness.      Objective:    BP 130/82 (BP Location: Left Arm, Patient Position: Sitting, Cuff Size: Normal)   Pulse 81   Temp 98.4 F (36.9 C) (Oral)   Resp 16   Ht 4\' 11"  (1.499 m)   Wt 145 lb 6.4 oz (66 kg)   SpO2 97%   BMI 29.37 kg/m  Nursing note and vital signs reviewed.  Physical Exam  Constitutional: She is oriented to person, place, and time. She appears well-developed and well-nourished. No distress.  Cardiovascular: Normal rate, regular rhythm, normal heart sounds and intact distal pulses.  Exam reveals no gallop and no friction rub.   No murmur heard. Pulmonary/Chest: Effort normal and breath sounds normal.  Abdominal: Soft. Bowel sounds are normal. She exhibits no distension and no mass. There is no tenderness. There is no rebound and no guarding.  Neurological: She is alert and oriented to person, place, and time.  Skin: Skin is warm and dry.  Psychiatric: She has a normal mood and affect. Her behavior is normal. Judgment and thought content normal.       Assessment & Plan:   Problem List Items Addressed This Visit      Digestive   GERD - Primary    In office EKG shows normal sinus rhythm with no evidence of ACS. Does have risk factors for ACS including hyperlipidemia and hypertension which appear adequately controlled with current medication regimen. Symptoms and exam are consistent with exacerbation of GERD most likely related to  nutritional intake. Continue current dosage of esomeprazole. May trial increase of Famotidine to 20 mg twice daily. Continue anti-gas medications as needed. Information on GERD related diet provided. Seek further care for symptom worsening.       Relevant Orders   EKG 12-Lead (Completed)       I have discontinued Ms. Massingale's hydrocortisone, amoxicillin, and predniSONE. I am also having her maintain her multivitamin, acetaminophen, tetrahydrozoline, OVER THE COUNTER MEDICATION, Esomeprazole Magnesium (NEXIUM PO), Omega-3 Fatty Acids (FISH OIL PO), famotidine, rosuvastatin, estradiol, and hydrochlorothiazide.   Follow-up: Return if symptoms worsen or fail to improve.  Mauricio Po, FNP

## 2016-09-21 NOTE — Assessment & Plan Note (Signed)
In office EKG shows normal sinus rhythm with no evidence of ACS. Does have risk factors for ACS including hyperlipidemia and hypertension which appear adequately controlled with current medication regimen. Symptoms and exam are consistent with exacerbation of GERD most likely related to nutritional intake. Continue current dosage of esomeprazole. May trial increase of Famotidine to 20 mg twice daily. Continue anti-gas medications as needed. Information on GERD related diet provided. Seek further care for symptom worsening.

## 2016-09-30 ENCOUNTER — Encounter: Payer: Self-pay | Admitting: Internal Medicine

## 2016-09-30 ENCOUNTER — Ambulatory Visit (INDEPENDENT_AMBULATORY_CARE_PROVIDER_SITE_OTHER): Payer: Medicare HMO | Admitting: Internal Medicine

## 2016-09-30 VITALS — BP 130/84 | HR 84 | Temp 99.3°F | Wt 140.0 lb

## 2016-09-30 DIAGNOSIS — J019 Acute sinusitis, unspecified: Secondary | ICD-10-CM | POA: Insufficient documentation

## 2016-09-30 MED ORDER — HYDROCODONE-HOMATROPINE 5-1.5 MG/5ML PO SYRP: 5.0000 mL | ORAL_SOLUTION | Freq: Four times a day (QID) | ORAL | 0 refills | Status: DC | PRN

## 2016-09-30 MED ORDER — AZITHROMYCIN 250 MG PO TABS: ORAL_TABLET | ORAL | 1 refills | Status: DC

## 2016-09-30 MED ORDER — PROMETHAZINE-CODEINE 6.25-10 MG/5ML PO SYRP: 5.0000 mL | ORAL_SOLUTION | ORAL | 0 refills | Status: AC | PRN

## 2016-09-30 NOTE — Progress Notes (Signed)
Pre visit review using our clinic review tool, if applicable. No additional management support is needed unless otherwise documented below in the visit note. 

## 2016-09-30 NOTE — Patient Instructions (Signed)
Please take all new medication as prescribed - the antibiotic, and cough medicine if needed  Please continue all other medications as before, and refills have been done if requested.  Please have the pharmacy call with any other refills you may need.  Please keep your appointments with your specialists as you may have planned     

## 2016-09-30 NOTE — Progress Notes (Signed)
   Subjective:    Patient ID: [name], female    DOB: 1946/04/21, 71 y.o.   MRN: [id number]  HPI   Here with 2-3 days acute onset fever, facial pain, pressure, headache, general weakness and malaise, and greenish d/c, with mild ST and cough, but pt denies chest pain, wheezing, increased sob or doe, orthopnea, PND, increased LE swelling, palpitations, dizziness or syncope.  Pt denies polydipsia, polyuria. Past Medical History:  Diagnosis Date  . Arthritis    KNEES  . CTS (carpal tunnel syndrome)    BILATERAL LEFT > RIGHT (OCC. HAND NUMBNESS)  . Female cystocele   . GERD (gastroesophageal reflux disease)   . H/O hiatal hernia   . History of cervical dysplasia    CIN II  . History of peritonitis    2006--  S/P PERFORATED BOWEL INJURY DURING SURGERY  . Hyperlipidemia    Past Surgical History:  Procedure Laterality Date  . ABDOMINAL HYSTERECTOMY  1991   W/ BILATERAL SALPINGOOPHORECTOMY  . LAPAROSCOPIC CHOLECYSTECTOMY  2006   W/ PERFERATED BOWEL (INJURY)  . NASAL SEPTUM SURGERY  70's   REPAIR DEVIATED SEPTUM  . TUBAL LIGATION  1980'S  . VAGINAL PROLAPSE REPAIR N/A 08/15/2012   Procedure: VAGINAL VAULT SUSPENSION;  Surgeon: Ailene Rud, MD;  Location: [facility];  Service: Urology;  Laterality: N/A;  ANTERIOR VAULT REPAIR WITH XENFORE    reports that she has never smoked. She has never used smokeless tobacco. She reports that she does not drink alcohol or use drugs. family history includes Cancer in her brother and other; Cancer (age of onset: 47) in her sister; Cancer (age of onset: 32) in her sister; Coronary artery disease in her mother and other; Diabetes in her other; Heart disease in her mother; Hypertension in her mother and other; Other in her father and mother; Stroke in her father and other. Allergies  Allergen Reactions  . Zocor [Simvastatin]     12/2013 CK 528  . Advil [Ibuprofen] Swelling    Tongue swells  . Bran [Wheat Bran] Other (See  Comments)    Very bad GI pain  . Hydrocodone Nausea Only and Other (See Comments)     TACHYCARDIA  . Hydrocodone-Acetaminophen Nausea Only    Other reaction(s): Increased Heart Rate (intolerance)  . Lipitor [Atorvastatin] Other (See Comments)    Bad dreams  . Pravachol [Pravastatin Sodium] Other (See Comments)    Bad dreams    Review of Systems All otherwise neg per pt     Objective:   Physical Exam BP 130/84   Pulse 84   Temp 99.3 F (37.4 C) (Oral)   Wt 140 lb (63.5 kg)   SpO2 98%   BMI 28.28 kg/m  VS noted,  Constitutional: Pt appears in NAD HENT: Head: NCAT.  Right Ear: External ear normal.  Left Ear: External ear normal.  Eyes: . Pupils are equal, round, and reactive to light. Conjunctivae and EOM are normal Nose: without d/c or deformity Bilat tm's with mild erythema.  Max sinus areas mild tender.  Pharynx with mild erythema, no exudate Neck: Neck supple. Gross normal ROM Cardiovascular: Normal rate and regular rhythm.   Pulmonary/Chest: Effort normal and breath sounds without rales or wheezing.  Neurological: Pt is alert. At baseline orientation, motor grossly intact Skin: Skin is warm. No rashes, other new lesions, no LE edema Psychiatric: Pt behavior is normal without agitation  No other exam findings    Assessment & Plan:

## 2016-10-04 NOTE — Assessment & Plan Note (Signed)
Mild to mod, for antibx course,  to f/u any worsening symptoms or concerns 

## 2016-10-15 ENCOUNTER — Other Ambulatory Visit: Payer: Self-pay | Admitting: Internal Medicine

## 2016-10-21 DIAGNOSIS — N8111 Cystocele, midline: Secondary | ICD-10-CM | POA: Diagnosis not present

## 2016-10-21 DIAGNOSIS — N952 Postmenopausal atrophic vaginitis: Secondary | ICD-10-CM | POA: Diagnosis not present

## 2016-10-21 DIAGNOSIS — N362 Urethral caruncle: Secondary | ICD-10-CM | POA: Diagnosis not present

## 2016-11-12 ENCOUNTER — Ambulatory Visit (INDEPENDENT_AMBULATORY_CARE_PROVIDER_SITE_OTHER): Payer: Medicare HMO | Admitting: Internal Medicine

## 2016-11-12 ENCOUNTER — Encounter: Payer: Self-pay | Admitting: Internal Medicine

## 2016-11-12 DIAGNOSIS — M25512 Pain in left shoulder: Secondary | ICD-10-CM | POA: Diagnosis not present

## 2016-11-12 MED ORDER — LIDOCAINE 5 % EX PTCH: 1.0000 | MEDICATED_PATCH | CUTANEOUS | 0 refills | Status: DC

## 2016-11-12 NOTE — Assessment & Plan Note (Signed)
No indication for x-ray at this time. Rx for lidoderm patches. She is not able to take any NSAIDs due to allergic reaction. She can also keep using tylenol, heat, otc creams for pain as well. Likely sprain and if no resolution will call back and we will send her to sports medicine for evaluation.

## 2016-11-12 NOTE — Patient Instructions (Signed)
We have sent in the lidocaine patches for the shoulder to use for pain. It is also okay to use the over the counter creams that you have been using if they work better.   Shoulder Exercises Ask your health care provider which exercises are safe for you. Do exercises exactly as told by your health care provider and adjust them as directed. It is normal to feel mild stretching, pulling, tightness, or discomfort as you do these exercises, but you should stop right away if you feel sudden pain or your pain gets worse.Do not begin these exercises until told by your health care provider. RANGE OF MOTION EXERCISES  These exercises warm up your muscles and joints and improve the movement and flexibility of your shoulder. These exercises also help to relieve pain, numbness, and tingling. These exercises involve stretching your injured shoulder directly. Exercise A: Pendulum   1. Stand near a wall or a surface that you can hold onto for balance. 2. Bend at the waist and let your left / right arm hang straight down. Use your other arm to support you. Keep your back straight and do not lock your knees. 3. Relax your left / right arm and shoulder muscles, and move your hips and your trunk so your left / right arm swings freely. Your arm should swing because of the motion of your body, not because you are using your arm or shoulder muscles. 4. Keep moving your body so your arm swings in the following directions, as told by your health care provider:  Side to side.  Forward and backward.  In clockwise and counterclockwise circles. 5. Continue each motion for __________ seconds, or for as long as told by your health care provider. 6. Slowly return to the starting position. Repeat __________ times. Complete this exercise __________ times a day. Exercise B:Flexion, Standing   1. Stand and hold a broomstick, a cane, or a similar object. Place your hands a little more than shoulder-width apart on the object.  Your left / right hand should be palm-up, and your other hand should be palm-down. 2. Keep your elbow straight and keep your shoulder muscles relaxed. Push the stick down with your healthy arm to raise your left / right arm in front of your body, and then over your head until you feel a stretch in your shoulder.  Avoid shrugging your shoulder while you raise your arm. Keep your shoulder blade tucked down toward the middle of your back. 3. Hold for __________ seconds. 4. Slowly return to the starting position. Repeat __________ times. Complete this exercise __________ times a day. Exercise C: Abduction, Standing  1. Stand and hold a broomstick, a cane, or a similar object. Place your hands a little more than shoulder-width apart on the object. Your left / right hand should be palm-up, and your other hand should be palm-down. 2. While keeping your elbow straight and your shoulder muscles relaxed, push the stick across your body toward your left / right side. Raise your left / right arm to the side of your body and then over your head until you feel a stretch in your shoulder.  Do not raise your arm above shoulder height, unless your health care provider tells you to do that.  Avoid shrugging your shoulder while you raise your arm. Keep your shoulder blade tucked down toward the middle of your back. 3. Hold for __________ seconds. 4. Slowly return to the starting position. Repeat __________ times. Complete this exercise __________ times  a day. Exercise D:Internal Rotation   1. Place your left / right hand behind your back, palm-up. 2. Use your other hand to dangle an exercise band, a towel, or a similar object over your shoulder. Grasp the band with your left / right hand so you are holding onto both ends. 3. Gently pull up on the band until you feel a stretch in the front of your left / right shoulder.  Avoid shrugging your shoulder while you raise your arm. Keep your shoulder blade tucked down  toward the middle of your back. 4. Hold for __________ seconds. 5. Release the stretch by letting go of the band and lowering your hands. Repeat __________ times. Complete this exercise __________ times a day. STRETCHING EXERCISES  These exercises warm up your muscles and joints and improve the movement and flexibility of your shoulder. These exercises also help to relieve pain, numbness, and tingling. These exercises are done using your healthy shoulder to help stretch the muscles of your injured shoulder. Exercise E: Warehouse manager (External Rotation and Abduction)   1. Stand in a doorway with one of your feet slightly in front of the other. This is called a staggered stance. If you cannot reach your forearms to the door frame, stand facing a corner of a room. 2. Choose one of the following positions as told by your health care provider:  Place your hands and forearms on the door frame above your head.  Place your hands and forearms on the door frame at the height of your head.  Place your hands on the door frame at the height of your elbows. 3. Slowly move your weight onto your front foot until you feel a stretch across your chest and in the front of your shoulders. Keep your head and chest upright and keep your abdominal muscles tight. 4. Hold for __________ seconds. 5. To release the stretch, shift your weight to your back foot. Repeat __________ times. Complete this stretch __________ times a day. Exercise F:Extension, Standing  1. Stand and hold a broomstick, a cane, or a similar object behind your back.  Your hands should be a little wider than shoulder-width apart.  Your palms should face away from your back. 2. Keeping your elbows straight and keeping your shoulder muscles relaxed, move the stick away from your body until you feel a stretch in your shoulder.  Avoid shrugging your shoulders while you move the stick. Keep your shoulder blade tucked down toward the middle of your  back. 3. Hold for __________ seconds. 4. Slowly return to the starting position. Repeat __________ times. Complete this exercise __________ times a day. STRENGTHENING EXERCISES  These exercises build strength and endurance in your shoulder. Endurance is the ability to use your muscles for a long time, even after they get tired. Exercise G:External Rotation   1. Sit in a stable chair without armrests. 2. Secure an exercise band at elbow height on your left / right side. 3. Place a soft object, such as a folded towel or a small pillow, between your left / right upper arm and your body to move your elbow a few inches away (about 10 cm) from your side. 4. Hold the end of the band so it is tight and there is no slack. 5. Keeping your elbow pressed against the soft object, move your left / right forearm out, away from your abdomen. Keep your body steady so only your forearm moves. 6. Hold for __________ seconds. 7. Slowly return to  the starting position. Repeat __________ times. Complete this exercise __________ times a day. Exercise H:Shoulder Abduction   1. Sit in a stable chair without armrests, or stand. 2. Hold a __________ weight in your left / right hand, or hold an exercise band with both hands. 3. Start with your arms straight down and your left / right palm facing in, toward your body. 4. Slowly lift your left / right hand out to your side. Do not lift your hand above shoulder height unless your health care provider tells you that this is safe.  Keep your arms straight.  Avoid shrugging your shoulder while you do this movement. Keep your shoulder blade tucked down toward the middle of your back. 5. Hold for __________ seconds. 6. Slowly lower your arm, and return to the starting position. Repeat __________ times. Complete this exercise __________ times a day. Exercise I:Shoulder Extension  1. Sit in a stable chair without armrests, or stand. 2. Secure an exercise band to a  stable object in front of you where it is at shoulder height. 3. Hold one end of the exercise band in each hand. Your palms should face each other. 4. Straighten your elbows and lift your hands up to shoulder height. 5. Step back, away from the secured end of the exercise band, until the band is tight and there is no slack. 6. Squeeze your shoulder blades together as you pull your hands down to the sides of your thighs. Stop when your hands are straight down by your sides. Do not let your hands go behind your body. 7. Hold for __________ seconds. 8. Slowly return to the starting position. Repeat __________ times. Complete this exercise __________ times a day. Exercise J:Standing Shoulder Row  1. Sit in a stable chair without armrests, or stand. 2. Secure an exercise band to a stable object in front of you so it is at waist height. 3. Hold one end of the exercise band in each hand. Your palms should be in a thumbs-up position. 4. Bend each of your elbows to an "L" shape (about 90 degrees) and keep your upper arms at your sides. 5. Step back until the band is tight and there is no slack. 6. Slowly pull your elbows back behind you. 7. Hold for __________ seconds. 8. Slowly return to the starting position. Repeat __________ times. Complete this exercise __________ times a day. Exercise K:Shoulder Press-Ups   1. Sit in a stable chair that has armrests. Sit upright, with your feet flat on the floor. 2. Put your hands on the armrests so your elbows are bent and your fingers are pointing forward. Your hands should be about even with the sides of your body. 3. Push down on the armrests and use your arms to lift yourself off of the chair. Straighten your elbows and lift yourself up as much as you comfortably can.  Move your shoulder blades down, and avoid letting your shoulders move up toward your ears.  Keep your feet on the ground. As you get stronger, your feet should support less of your body  weight as you lift yourself up. 4. Hold for __________ seconds. 5. Slowly lower yourself back into the chair. Repeat __________ times. Complete this exercise __________ times a day. Exercise L: Wall Push-Ups   1. Stand so you are facing a stable wall. Your feet should be about one arm-length away from the wall. 2. Lean forward and place your palms on the wall at shoulder height. 3. Keep your  feet flat on the floor as you bend your elbows and lean forward toward the wall. 4. Hold for __________ seconds. 5. Straighten your elbows to push yourself back to the starting position. Repeat __________ times. Complete this exercise __________ times a day. This information is not intended to replace advice given to you by your health care provider. Make sure you discuss any questions you have with your health care provider. Document Released: 04/29/2005 Document Revised: 03/09/2016 Document Reviewed: 02/24/2015 Elsevier Interactive Patient Education  2017 [employer].

## 2016-11-12 NOTE — Progress Notes (Signed)
   Subjective:    Patient ID: [name], female    DOB: [birth year], 71 y.o.   MRN: [id number]  HPI The patient is a 71 YO female coming in for pain in her left shoulder. Started about 2 days ago. She has tried tylenol which was effective for the pain as well as some otc creams which were mildly helpful. She denies overuse or injury that she can recall prior to onset. No falls or trauma to the area. She denies old injury to the area. She denies ROM problems although it is sore to lift over her head.   Review of Systems  Constitutional: Negative.   Respiratory: Negative.   Cardiovascular: Negative.   Gastrointestinal: Negative.   Musculoskeletal: Positive for arthralgias and myalgias. Negative for back pain, gait problem, joint swelling, neck pain and neck stiffness.  Skin: Negative.   Neurological: Negative.       Objective:   Physical Exam  Constitutional: She is oriented to person, place, and time. She appears well-developed and well-nourished.  HENT:  Head: Normocephalic and atraumatic.  Eyes: EOM are normal.  Neck: Normal range of motion.  Cardiovascular: Normal rate and regular rhythm.   Pulmonary/Chest: Effort normal.  Abdominal: Soft. She exhibits no distension. There is no tenderness.  Musculoskeletal: She exhibits tenderness. She exhibits no edema.  Pain in the shoulder AC joint, some mild limitation of ROM due to pain but passive ROM complete.   Neurological: She is alert and oriented to person, place, and time.  Skin: Skin is warm and dry.   Vitals:   11/12/16 0845  BP: 110/70  Pulse: 74  Resp: 12  Temp: 98.3 F (36.8 C)  TempSrc: Oral  SpO2: 98%  Weight: 144 lb (65.3 kg)  Height: 4\' 11"  (1.499 m)      Assessment & Plan:

## 2016-11-20 ENCOUNTER — Telehealth: Payer: Self-pay | Admitting: *Deleted

## 2016-11-20 NOTE — Telephone Encounter (Signed)
Rec'd call pt states MD rx Lidoderm patches, but the insurance is requiring a PA. Would like MD to rx something else...[name]

## 2016-11-20 NOTE — Telephone Encounter (Signed)
Rec'd msg back stating your request has been denied PA Case: [id number], Status: Denied., reasons does not meet medicare guidelines...[name]

## 2016-11-20 NOTE — Telephone Encounter (Signed)
[name]... Have you seen PA for this pt on the Lidoderm patches should have been sent 11/13/16...Johny Chess

## 2016-11-20 NOTE — Telephone Encounter (Signed)
Completed PA on cover-my-meds received msg  [name] (Key: [id number])  The request has received a Pending outcome. Questionnaire submitted. PA Case [id number] Status: Pending review. Waiting on approval status,,,/lmb

## 2016-11-20 NOTE — Telephone Encounter (Signed)
I only have one PA request right now and its for a different patient, can pharmacy send again

## 2016-11-20 NOTE — Telephone Encounter (Signed)
Would recommend to try PA. This is safest medication.

## 2016-11-25 NOTE — Telephone Encounter (Signed)
Can we do a clinical appeal as she cannot really take other meds due to allergies.

## 2016-12-03 NOTE — Telephone Encounter (Signed)
Pt called back regarding this, please call back.  °

## 2016-12-04 NOTE — Telephone Encounter (Signed)
Forwarding msg to MD assistance to try and do a clinical appeal on PA. [name]... Dr. Sharlet Salina is wanting a clinical appeal for this pt since her Lidoderm patches was denied...[name]

## 2016-12-04 NOTE — Telephone Encounter (Signed)
Appeal done verbally, Mcarthur Rossetti stated it will take 5 business days, I also have to fax supporting documentation (allergiy list) to [phone]

## 2016-12-08 ENCOUNTER — Telehealth: Payer: Self-pay

## 2016-12-08 NOTE — Telephone Encounter (Signed)
Appeal denied for Lidocaine patches

## 2016-12-22 ENCOUNTER — Other Ambulatory Visit: Payer: Self-pay | Admitting: Internal Medicine

## 2016-12-31 ENCOUNTER — Other Ambulatory Visit: Payer: Self-pay | Admitting: Internal Medicine

## 2017-03-09 ENCOUNTER — Other Ambulatory Visit: Payer: Self-pay | Admitting: Family

## 2017-03-23 NOTE — Progress Notes (Signed)
[name] [address] [place], [place] [address] Phone: [phone] Subjective:    I'm seeing this patient by the request  of:  Hoyt Koch, MD   CC: Left shoulder pain  [id number]  [name] is a 71 y.o. female coming in with complaint of left shoulder pain. Patient explains her shoulder was hurting this morning. Her pain moves up and down her arm and sometimes she experiences pain in her neck. She uses over the counter creams for pain. She was prescribed an exercise program but she admitted that sometimes she forgets to do them. When she does do them she says she doesn't feel like they helped.   Onset- 2 years  Location- Posterior shoulder  Duration- Pain is off and on Character- Achy  Aggravating factors- Shoulder extension, Shoulder abduction  Reliving factors-   Therapies tried- Tylenol  Severity- at its worse 7      Past Medical History:  Diagnosis Date  . Arthritis    KNEES  . CTS (carpal tunnel syndrome)    BILATERAL LEFT > RIGHT (OCC. HAND NUMBNESS)  . Female cystocele   . GERD (gastroesophageal reflux disease)   . H/O hiatal hernia   . History of cervical dysplasia    CIN II  . History of peritonitis    2006--  S/P PERFORATED BOWEL INJURY DURING SURGERY  . Hyperlipidemia    Past Surgical History:  Procedure Laterality Date  . ABDOMINAL HYSTERECTOMY  1991   W/ BILATERAL SALPINGOOPHORECTOMY  . LAPAROSCOPIC CHOLECYSTECTOMY  2006   W/ PERFERATED BOWEL (INJURY)  . NASAL SEPTUM SURGERY  70's   REPAIR DEVIATED SEPTUM  . TUBAL LIGATION  1980'S  . VAGINAL PROLAPSE REPAIR N/A 08/15/2012   Procedure: VAGINAL VAULT SUSPENSION;  Surgeon: Ailene Rud, MD;  Location: [facility];  Service: Urology;  Laterality: N/A;  ANTERIOR VAULT REPAIR WITH XENFORE   Social History   Social History  . Marital status: Married    Spouse name: N/A  . Number of children: N/A  . Years of education: N/A    Social History Main Topics  . Smoking status: Never Smoker  . Smokeless tobacco: Never Used  . Alcohol use No     Comment: no  . Drug use: No  . Sexual activity: Not Currently    Birth control/ protection: Surgical   Other Topics Concern  . Not on file   Social History Narrative   Married '68. 2 son - '69, '70; 1 son - '71: 2 grandchildren. 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   Allergies  Allergen Reactions  . Zocor [Simvastatin]     12/2013 CK 528  . Advil [Ibuprofen] Swelling    Tongue swells  . Bran [Wheat Bran] Other (See Comments)    Very bad GI pain  . Hydrocodone Nausea Only and Other (See Comments)     TACHYCARDIA  . Hydrocodone-Acetaminophen Nausea Only    Other reaction(s): Increased Heart Rate (intolerance)  . Lipitor [Atorvastatin] Other (See Comments)    Bad dreams  . Pravachol [Pravastatin Sodium] Other (See Comments)    Bad dreams   Family History  Problem Relation Age of Onset  . Coronary artery disease Mother   . Other Mother        cva  . Hypertension Mother   . Heart disease Mother   . Other Father        CVA  . Stroke Father   . Cancer Sister 62  breast  . Cancer Brother        prancreatic  . Cancer Sister 59       breast  . Diabetes Other   . Coronary artery disease Other   . Hypertension Other   . Stroke Other   . Cancer Other        breast  . Colon cancer Neg Hx      Past medical history, social, surgical and family history all reviewed in electronic medical record.  No pertanent information unless stated regarding to the chief complaint.   Review of Systems:Review of systems updated and as accurate as of 03/23/17  No headache, visual changes, nausea, vomiting, diarrhea, constipation, dizziness, abdominal pain, skin rash, fevers, chills, night sweats, weight loss, swollen lymph nodes, body aches, joint swelling, muscle aches, chest pain, shortness of breath, mood changes.   Objective  There were no vitals taken  for this visit. Systems examined below as of 03/23/17   General: No apparent distress alert and oriented x3 mood and affect normal, dressed appropriately.  HEENT: Pupils equal, extraocular movements intact  Respiratory: Patient's speak in full sentences and does not appear short of breath  Cardiovascular: No lower extremity edema, non tender, no erythema  Skin: Warm dry intact with no signs of infection or rash on extremities or on axial skeleton.  Abdomen: Soft nontender  Neuro: Cranial nerves II through XII are intact, neurovascularly intact in all extremities with 2+ DTRs and 2+ pulses.  Lymph: No lymphadenopathy of posterior or anterior cervical chain or axillae bilaterally.  Gait normal with good balance and coordination.  MSK:  Non tender with full range of motion and good stability and symmetric strength and tone of elbows, wrist, hip, knee and ankles bilaterally.  Shoulder: left Inspection reveals mild atrophy. Palpation is normal with no tenderness over AC joint or bicipital groove. Lacks last 10 of external rotation Rotator cuff strength 4+ out of 5 compared to the contralateral side signs of impingement with positive Neer and Hawkin's tests, but negative empty can sign. Speeds and Yergason's tests normal. Positive O'Brien's Normal scapular function observed. No painful arc and no drop arm sign. No apprehension sign  MSK US performed of: left This study was ordered, performed, and interpreted by Charlann Boxer D.O.  Shoulder:   Degenerative tear of the supraspinatus noted. Patient also has some underlying osteophytic changes of the shoulder.  Impression: Rotator cuff arthropathy with no retraction   Procedure: Real-time Ultrasound Guided Injection of left glenohumeral joint Device: GE Logiq E  Ultrasound guided injection is preferred based studies that show increased duration, increased effect, greater accuracy, decreased procedural pain, increased response rate with  ultrasound guided versus blind injection.  Verbal informed consent obtained.  Time-out conducted.  Noted no overlying erythema, induration, or other signs of local infection.  Skin prepped in a sterile fashion.  Local anesthesia: Topical Ethyl chloride.  With sterile technique and under real time ultrasound guidance:  Joint visualized.  23g 1  inch needle inserted posterior approach. Pictures taken for needle placement. Patient did have injection of 2 cc of 1% lidocaine, 2 cc of 0.5% Marcaine, and 1.0 cc of Kenalog 40 mg/dL. Completed without difficulty  Pain immediately resolved suggesting accurate placement of the medication.  Advised to call if fevers/chills, erythema, induration, drainage, or persistent bleeding.  Images permanently stored and available for review in the ultrasound unit.  Impression: Technically successful ultrasound guided injection.  Procedure note 13086; 15 additional minutes spent for Therapeutic exercises as  stated in above notes.  This included exercises focusing on stretching, strengthening, with significant focus on eccentric aspects.   Long term goals include an improvement in range of motion, strength, endurance as well as avoiding reinjury. Patient's frequency would include in 1-2 times a day, 3-5 times a week for a duration of 6-12 weeks. Shoulder Exercises that included:  Basic scapular stabilization to include adduction and depression of scapula Scaption, focusing on proper movement and good control Internal and External rotation utilizing a theraband, with elbow tucked at side entire time Rows with theraband which was given today  .shoulder Proper technique shown and discussed handout in great detail with ATC.  All questions were discussed and answered.     Impression and Recommendations:     This case required medical decision making of moderate complexity.      Note: This dictation was prepared with Dragon dictation along with smaller phrase  technology. Any transcriptional errors that result from this process are unintentional.

## 2017-03-24 ENCOUNTER — Encounter: Payer: Self-pay | Admitting: Family Medicine

## 2017-03-24 ENCOUNTER — Ambulatory Visit (INDEPENDENT_AMBULATORY_CARE_PROVIDER_SITE_OTHER): Payer: Medicare HMO | Admitting: Family Medicine

## 2017-03-24 DIAGNOSIS — M12812 Other specific arthropathies, not elsewhere classified, left shoulder: Secondary | ICD-10-CM | POA: Diagnosis not present

## 2017-03-24 NOTE — Patient Instructions (Addendum)
Good to see you  Ice is your friend  Ice 20 minutes 2 times daily. Usually after activity and before bed. Exercises 3 times a week.  Over the counter get  Tylenol 500mg  3 times a day  Turmeric 500mg  daily  Tart cherry extract any dose at home.  Vitamin D 2000 IU daily  Injected shoulder today for rotator cuff and arthritis See me again in 4 weeks.

## 2017-03-24 NOTE — Assessment & Plan Note (Signed)
Injected today. Patient on ultrasound does have some arthritic changes as well as what appears to be a degenerative tear of the rotator cuff. Discussed with patient at great length and elected do an injection. Discussed home exercises, icing regimen, which activities doing which ones to avoid. Discussed the possibility of also this being a cervical radiculopathy and we will monitor with patient having very poor posture. Follow-up again in 4 weeks.

## 2017-04-20 ENCOUNTER — Ambulatory Visit (INDEPENDENT_AMBULATORY_CARE_PROVIDER_SITE_OTHER): Payer: Medicare HMO | Admitting: Family Medicine

## 2017-04-20 ENCOUNTER — Encounter: Payer: Self-pay | Admitting: Family Medicine

## 2017-04-20 DIAGNOSIS — M12812 Other specific arthropathies, not elsewhere classified, left shoulder: Secondary | ICD-10-CM

## 2017-04-20 NOTE — Progress Notes (Signed)
[name] [address] [place], [place] [address] Phone: [phone] Subjective:     CC: Left shoulder pain  [id number]  [name] is a 71 y.o. female coming in with complaint of left shoulder pain. Patient was found to have located rotator cuff arthropathy of the left shoulder. Injected in 03/24/2017. States that she is feeling 90% better. Still states certain movements causes a sharp pain that seems to resolve some significantly quicker. Still has some weakness. Unable to do daily activities.       Past Medical History:  Diagnosis Date  . Arthritis    KNEES  . CTS (carpal tunnel syndrome)    BILATERAL LEFT > RIGHT (OCC. HAND NUMBNESS)  . Female cystocele   . GERD (gastroesophageal reflux disease)   . H/O hiatal hernia   . History of cervical dysplasia    CIN II  . History of peritonitis    2006--  S/P PERFORATED BOWEL INJURY DURING SURGERY  . Hyperlipidemia    Past Surgical History:  Procedure Laterality Date  . ABDOMINAL HYSTERECTOMY  1991   W/ BILATERAL SALPINGOOPHORECTOMY  . LAPAROSCOPIC CHOLECYSTECTOMY  2006   W/ PERFERATED BOWEL (INJURY)  . NASAL SEPTUM SURGERY  70's   REPAIR DEVIATED SEPTUM  . TUBAL LIGATION  1980'S  . VAGINAL PROLAPSE REPAIR N/A 08/15/2012   Procedure: VAGINAL VAULT SUSPENSION;  Surgeon: Ailene Rud, MD;  Location: [facility];  Service: Urology;  Laterality: N/A;  ANTERIOR VAULT REPAIR WITH XENFORE   Social History   Social History  . Marital status: Married    Spouse name: N/A  . Number of children: N/A  . Years of education: N/A   Social History Main Topics  . Smoking status: Never Smoker  . Smokeless tobacco: Never Used  . Alcohol use No     Comment: no  . Drug use: No  . Sexual activity: Not Currently    Birth control/ protection: Surgical   Other Topics Concern  . None   Social History Narrative   Married '68. 2 son - '69, '70; 1 son - '71: 2 grandchildren.  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   Allergies  Allergen Reactions  . Zocor [Simvastatin]     12/2013 CK 528  . Advil [Ibuprofen] Swelling    Tongue swells  . Bran [Wheat Bran] Other (See Comments)    Very bad GI pain  . Hydrocodone Nausea Only and Other (See Comments)     TACHYCARDIA  . Hydrocodone-Acetaminophen Nausea Only    Other reaction(s): Increased Heart Rate (intolerance)  . Lipitor [Atorvastatin] Other (See Comments)    Bad dreams  . Pravachol [Pravastatin Sodium] Other (See Comments)    Bad dreams   Family History  Problem Relation Age of Onset  . Coronary artery disease Mother   . Other Mother        cva  . Hypertension Mother   . Heart disease Mother   . Other Father        CVA  . Stroke Father   . Cancer Sister 81       breast  . Cancer Brother        prancreatic  . Cancer Sister 39       breast  . Diabetes Other   . Coronary artery disease Other   . Hypertension Other   . Stroke Other   . Cancer Other        breast  . Colon cancer  Neg Hx      Past medical history, social, surgical and family history all reviewed in electronic medical record.  No pertanent information unless stated regarding to the chief complaint.   Review of Systems:Review of systems updated and as accurate as of 04/20/17  No headache, visual changes, nausea, vomiting, diarrhea, constipation, dizziness, abdominal pain, skin rash, fevers, chills, night sweats, weight loss, swollen lymph nodes, body aches, joint swelling, muscle aches, chest pain, shortness of breath, mood changes.   Objective  Blood pressure 134/80, pulse 73, height 5' (1.524 m), weight 146 lb (66.2 kg), SpO2 97 %. Systems examined below as of 04/20/17   General: No apparent distress alert and oriented x3 mood and affect normal, dressed appropriately.  HEENT: Pupils equal, extraocular movements intact  Respiratory: Patient's speak in full sentences and does not appear short of breath  Cardiovascular:  No lower extremity edema, non tender, no erythema  Skin: Warm dry intact with no signs of infection or rash on extremities or on axial skeleton.  Abdomen: Soft nontender  Neuro: Cranial nerves II through XII are intact, neurovascularly intact in all extremities with 2+ DTRs and 2+ pulses.  Lymph: No lymphadenopathy of posterior or anterior cervical chain or axillae bilaterally.  Gait normal with good balance and coordination.  MSK:  Non tender with full range of motion and good stability and symmetric strength and tone of , elbows, wrist, hip, knee and ankles bilaterally. Mild to moderate arthritic changes.  Shoulder: Left Inspection reveals mild atrophy Palpation is normal  Crepitus with range of motion external rotation only 5-10 Rotator cuff strength 3 out of 5 Positive impingement Speeds and Yergason's tests normal. No labral pathology noted with negative Obrien's, negative clunk and good stability. Positive painful arc Contralateral shoulder show some crepitus but full range of motion      Impression and Recommendations:     This case required medical decision making of moderate complexity.      Note: This dictation was prepared with Dragon dictation along with smaller phrase technology. Any transcriptional errors that result from this process are unintentional.

## 2017-04-20 NOTE — Assessment & Plan Note (Signed)
Discussed with patient at great length. Patient is doing very well. No change in management at this time. Continue the home exercises icing regimen. Follow-up with me again in 6-8 weeks.

## 2017-04-20 NOTE — Patient Instructions (Signed)
Good to see you  [name] is your friend.  Keep hands within peripheral vision.  Exercises 1-2 times a week See me again in 6-8 weeks.

## 2017-05-05 ENCOUNTER — Other Ambulatory Visit: Payer: Self-pay | Admitting: Internal Medicine

## 2017-05-14 ENCOUNTER — Other Ambulatory Visit (INDEPENDENT_AMBULATORY_CARE_PROVIDER_SITE_OTHER): Payer: Medicare HMO

## 2017-05-14 ENCOUNTER — Ambulatory Visit: Payer: Medicare HMO | Admitting: Internal Medicine

## 2017-05-14 ENCOUNTER — Encounter: Payer: Self-pay | Admitting: Internal Medicine

## 2017-05-14 VITALS — BP 122/80 | HR 71 | Temp 98.6°F | Ht 60.0 in | Wt 144.0 lb

## 2017-05-14 DIAGNOSIS — Z23 Encounter for immunization: Secondary | ICD-10-CM | POA: Diagnosis not present

## 2017-05-14 DIAGNOSIS — Z0000 Encounter for general adult medical examination without abnormal findings: Secondary | ICD-10-CM

## 2017-05-14 DIAGNOSIS — R7301 Impaired fasting glucose: Secondary | ICD-10-CM | POA: Diagnosis not present

## 2017-05-14 DIAGNOSIS — E785 Hyperlipidemia, unspecified: Secondary | ICD-10-CM

## 2017-05-14 DIAGNOSIS — I10 Essential (primary) hypertension: Secondary | ICD-10-CM

## 2017-05-14 LAB — CBC
HCT: 41.5 % (ref 36.0–46.0)
Hemoglobin: 13.8 g/dL (ref 12.0–15.0)
MCHC: 33.2 g/dL (ref 30.0–36.0)
MCV: 92.2 fl (ref 78.0–100.0)
Platelets: 306 10*3/uL (ref 150.0–400.0)
RBC: 4.5 Mil/uL (ref 3.87–5.11)
RDW: 14.3 % (ref 11.5–15.5)
WBC: 6 10*3/uL (ref 4.0–10.5)

## 2017-05-14 LAB — HEMOGLOBIN A1C: Hgb A1c MFr Bld: 6 % (ref 4.6–6.5)

## 2017-05-14 LAB — COMPREHENSIVE METABOLIC PANEL
ALT: 24 U/L (ref 0–35)
AST: 23 U/L (ref 0–37)
Albumin: 4.2 g/dL (ref 3.5–5.2)
Alkaline Phosphatase: 54 U/L (ref 39–117)
BUN: 13 mg/dL (ref 6–23)
CALCIUM: 10.2 mg/dL (ref 8.4–10.5)
CHLORIDE: 97 meq/L (ref 96–112)
CO2: 38 meq/L — AB (ref 19–32)
CREATININE: 0.74 mg/dL (ref 0.40–1.20)
GFR: 99.32 mL/min (ref >60.00)
GLUCOSE: 102 mg/dL — AB (ref 70–99)
Potassium: 3.4 mEq/L — ABNORMAL LOW (ref 3.5–5.1)
SODIUM: 141 meq/L (ref 135–145)
Total Bilirubin: 1 mg/dL (ref 0.2–1.2)
Total Protein: 7 g/dL (ref 6.0–8.3)

## 2017-05-14 LAB — LIPID PANEL
CHOL/HDL RATIO: 3
Cholesterol: 190 mg/dL (ref 0–200)
HDL: 62.1 mg/dL (ref >39.00)
LDL Cholesterol: 108 mg/dL — ABNORMAL HIGH (ref 0–99)
NONHDL: 127.9
Triglycerides: 99 mg/dL (ref 0.0–149.0)
VLDL: 19.8 mg/dL (ref 0.0–40.0)

## 2017-05-14 MED ORDER — HYDROCHLOROTHIAZIDE 12.5 MG PO CAPS: 12.5000 mg | ORAL_CAPSULE | Freq: Every day | ORAL | 3 refills | Status: DC

## 2017-05-14 NOTE — Assessment & Plan Note (Signed)
Checking HgA1c today and adjust as needed. No diabetes currently.

## 2017-05-14 NOTE — Assessment & Plan Note (Signed)
Flu shot given at visit, tetanus and pneumonia up to date. Colonoscopy and mammogram up to date. Counseled about regular exercise and sun safety with mole surveillance. Given 10 year screening recommendations.

## 2017-05-14 NOTE — Progress Notes (Signed)
   Subjective:    Patient ID: [name], female    DOB: July 28, 1945, 71 y.o.   MRN: [id number]  HPI Here for medicare wellness and physical, no new complaints. Please see A/P for status and treatment of chronic medical problems.   Diet: heart healthy Physical activity: sedentary Depression/mood screen: negative Hearing: intact to whispered voice Visual acuity: grossly normal, performs annual eye exam  ADLs: capable Fall risk: none Home safety: good Cognitive evaluation: intact to orientation, naming, recall and repetition EOL planning: adv directives discussed  I have personally reviewed and have noted 1. The patient's medical and social history - reviewed today no changes 2. Their use of alcohol, tobacco or illicit drugs 3. Their current medications and supplements 4. The patient's functional ability including ADL's, fall risks, home safety risks and hearing or visual impairment. 5. Diet and physical activities 6. Evidence for depression or mood disorders 7. Care team reviewed and updated (available in snapshot)  Review of Systems  Constitutional: Negative.   HENT: Negative.   Eyes: Negative.   Respiratory: Negative for cough, chest tightness and shortness of breath.   Cardiovascular: Negative for chest pain, palpitations and leg swelling.  Gastrointestinal: Negative for abdominal distention, abdominal pain, constipation, diarrhea, nausea and vomiting.  Musculoskeletal: Positive for arthralgias.  Skin: Negative.   Neurological: Negative.   Psychiatric/Behavioral: Negative.       Objective:   Physical Exam  Constitutional: She is oriented to person, place, and time. She appears well-developed and well-nourished.  HENT:  Head: Normocephalic and atraumatic.  Eyes: EOM are normal.  Neck: Normal range of motion.  Cardiovascular: Normal rate and regular rhythm.  Pulmonary/Chest: Effort normal and breath sounds normal. No respiratory distress. She has no wheezes. She has  no rales.  Abdominal: Soft. Bowel sounds are normal. She exhibits no distension. There is no tenderness. There is no rebound.  Musculoskeletal: She exhibits no edema.  Left shoulder tenderness  Neurological: She is alert and oriented to person, place, and time. Coordination normal.  Skin: Skin is warm and dry.  Psychiatric: She has a normal mood and affect.   Vitals:   05/14/17 0940  BP: 122/80  Pulse: 71  Temp: 98.6 F (37 C)  TempSrc: Oral  SpO2: 100%  Weight: 144 lb (65.3 kg)  Height: 5' (1.524 m)      Assessment & Plan:  Flu shot given at visit

## 2017-05-14 NOTE — Assessment & Plan Note (Signed)
Checking lipid panel and adjust her crestor as needed.

## 2017-05-14 NOTE — Assessment & Plan Note (Signed)
Taking hctz 12.5 mg daily, checking CMP and adjust as needed.

## 2017-05-14 NOTE — Patient Instructions (Signed)
We have given you the flu shot and will check the labs today.   Health Maintenance, Female Adopting a healthy lifestyle and getting preventive care can go a long way to promote health and wellness. Talk with your health care provider about what schedule of regular examinations is right for you. This is a good chance for you to check in with your provider about disease prevention and staying healthy. In between checkups, there are plenty of things you can do on your own. Experts have done a lot of research about which lifestyle changes and preventive measures are most likely to keep you healthy. Ask your health care provider for more information. Weight and diet Eat a healthy diet  Be sure to include plenty of vegetables, fruits, low-fat dairy products, and lean protein.  Do not eat a lot of foods high in solid fats, added sugars, or salt.  Get regular exercise. This is one of the most important things you can do for your health. ? Most adults should exercise for at least 150 minutes each week. The exercise should increase your heart rate and make you sweat (moderate-intensity exercise). ? Most adults should also do strengthening exercises at least twice a week. This is in addition to the moderate-intensity exercise.  Maintain a healthy weight  Body mass index (BMI) is a measurement that can be used to identify possible weight problems. It estimates body fat based on height and weight. Your health care provider can help determine your BMI and help you achieve or maintain a healthy weight.  For females 24 years of age and older: ? A BMI below 18.5 is considered underweight. ? A BMI of 18.5 to 24.9 is normal. ? A BMI of 25 to 29.9 is considered overweight. ? A BMI of 30 and above is considered obese.  Watch levels of cholesterol and blood lipids  You should start having your blood tested for lipids and cholesterol at 71 years of age, then have this test every 5 years.  You may need to  have your cholesterol levels checked more often if: ? Your lipid or cholesterol levels are high. ? You are older than 71 years of age. ? You are at high risk for heart disease.  Cancer screening Lung Cancer  Lung cancer screening is recommended for adults 28-84 years old who are at high risk for lung cancer because of a history of smoking.  A yearly low-dose CT scan of the lungs is recommended for people who: ? Currently smoke. ? Have quit within the past 15 years. ? Have at least a 30-pack-year history of smoking. A pack year is smoking an average of one pack of cigarettes a day for 1 year.  Yearly screening should continue until it has been 15 years since you quit.  Yearly screening should stop if you develop a health problem that would prevent you from having lung cancer treatment.  Breast Cancer  Practice breast self-awareness. This means understanding how your breasts normally appear and feel.  It also means doing regular breast self-exams. Let your health care provider know about any changes, no matter how small.  If you are in your 20s or 30s, you should have a clinical breast exam (CBE) by a health care provider every 1-3 years as part of a regular health exam.  If you are 3 or older, have a CBE every year. Also consider having a breast X-ray (mammogram) every year.  If you have a family history of breast cancer, talk  to your health care provider about genetic screening.  If you are at high risk for breast cancer, talk to your health care provider about having an MRI and a mammogram every year.  Breast cancer gene (BRCA) assessment is recommended for women who have family members with BRCA-related cancers. BRCA-related cancers include: ? Breast. ? Ovarian. ? Tubal. ? Peritoneal cancers.  Results of the assessment will determine the need for genetic counseling and BRCA1 and BRCA2 testing.  Cervical Cancer Your health care provider may recommend that you be screened  regularly for cancer of the pelvic organs (ovaries, uterus, and vagina). This screening involves a pelvic examination, including checking for microscopic changes to the surface of your cervix (Pap test). You may be encouraged to have this screening done every 3 years, beginning at age 21.  For women ages 30-65, health care providers may recommend pelvic exams and Pap testing every 3 years, or they may recommend the Pap and pelvic exam, combined with testing for human papilloma virus (HPV), every 5 years. Some types of HPV increase your risk of cervical cancer. Testing for HPV may also be done on women of any age with unclear Pap test results.  Other health care providers may not recommend any screening for nonpregnant women who are considered low risk for pelvic cancer and who do not have symptoms. Ask your health care provider if a screening pelvic exam is right for you.  If you have had past treatment for cervical cancer or a condition that could lead to cancer, you need Pap tests and screening for cancer for at least 20 years after your treatment. If Pap tests have been discontinued, your risk factors (such as having a new sexual partner) need to be reassessed to determine if screening should resume. Some women have medical problems that increase the chance of getting cervical cancer. In these cases, your health care provider may recommend more frequent screening and Pap tests.  Colorectal Cancer  This type of cancer can be detected and often prevented.  Routine colorectal cancer screening usually begins at 71 years of age and continues through 71 years of age.  Your health care provider may recommend screening at an earlier age if you have risk factors for colon cancer.  Your health care provider may also recommend using home test kits to check for hidden blood in the stool.  A small camera at the end of a tube can be used to examine your colon directly (sigmoidoscopy or colonoscopy). This is  done to check for the earliest forms of colorectal cancer.  Routine screening usually begins at age 50.  Direct examination of the colon should be repeated every 5-10 years through 71 years of age. However, you may need to be screened more often if early forms of precancerous polyps or small growths are found.  Skin Cancer  Check your skin from head to toe regularly.  Tell your health care provider about any new moles or changes in moles, especially if there is a change in a mole's shape or color.  Also tell your health care provider if you have a mole that is larger than the size of a pencil eraser.  Always use sunscreen. Apply sunscreen liberally and repeatedly throughout the day.  Protect yourself by wearing long sleeves, pants, a wide-brimmed hat, and sunglasses whenever you are outside.  Heart disease, diabetes, and high blood pressure  High blood pressure causes heart disease and increases the risk of stroke. High blood pressure is more   likely to develop in: ? People who have blood pressure in the high end of the normal range (130-139/85-89 mm Hg). ? People who are overweight or obese. ? People who are African American.  If you are 90-95 years of age, have your blood pressure checked every 3-5 years. If you are 4 years of age or older, have your blood pressure checked every year. You should have your blood pressure measured twice-once when you are at a hospital or clinic, and once when you are not at a hospital or clinic. Record the average of the two measurements. To check your blood pressure when you are not at a hospital or clinic, you can use: ? An automated blood pressure machine at a pharmacy. ? A home blood pressure monitor.  If you are between 44 years and 21 years old, ask your health care provider if you should take aspirin to prevent strokes.  Have regular diabetes screenings. This involves taking a blood sample to check your fasting blood sugar level. ? If you are  at a normal weight and have a low risk for diabetes, have this test once every three years after 71 years of age. ? If you are overweight and have a high risk for diabetes, consider being tested at a younger age or more often. Preventing infection Hepatitis B  If you have a higher risk for hepatitis B, you should be screened for this virus. You are considered at high risk for hepatitis B if: ? You were born in a country where hepatitis B is common. Ask your health care provider which countries are considered high risk. ? Your parents were born in a high-risk country, and you have not been immunized against hepatitis B (hepatitis B vaccine). ? You have HIV or AIDS. ? You use needles to inject street drugs. ? You live with someone who has hepatitis B. ? You have had sex with someone who has hepatitis B. ? You get hemodialysis treatment. ? You take certain medicines for conditions, including cancer, organ transplantation, and autoimmune conditions.  Hepatitis C  Blood testing is recommended for: ? Everyone born from [birth year] through 1965. ? Anyone with known risk factors for hepatitis C.  Sexually transmitted infections (STIs)  You should be screened for sexually transmitted infections (STIs) including gonorrhea and chlamydia if: ? You are sexually active and are younger than 71 years of age. ? You are older than 71 years of age and your health care provider tells you that you are at risk for this type of infection. ? Your sexual activity has changed since you were last screened and you are at an increased risk for chlamydia or gonorrhea. Ask your health care provider if you are at risk.  If you do not have HIV, but are at risk, it may be recommended that you take a prescription medicine daily to prevent HIV infection. This is called pre-exposure prophylaxis (PrEP). You are considered at risk if: ? You are sexually active and do not regularly use condoms or know the HIV status of your  partner(s). ? You take drugs by injection. ? You are sexually active with a partner who has HIV.  Talk with your health care provider about whether you are at high risk of being infected with HIV. If you choose to begin PrEP, you should first be tested for HIV. You should then be tested every 3 months for as long as you are taking PrEP. Pregnancy  If you are premenopausal and you may  become pregnant, ask your health care provider about preconception counseling.  If you may become pregnant, take 400 to 800 micrograms (mcg) of folic acid every day.  If you want to prevent pregnancy, talk to your health care provider about birth control (contraception). Osteoporosis and menopause  Osteoporosis is a disease in which the bones lose minerals and strength with aging. This can result in serious bone fractures. Your risk for osteoporosis can be identified using a bone density scan.  If you are 40 years of age or older, or if you are at risk for osteoporosis and fractures, ask your health care provider if you should be screened.  Ask your health care provider whether you should take a calcium or vitamin D supplement to lower your risk for osteoporosis.  Menopause may have certain physical symptoms and risks.  Hormone replacement therapy may reduce some of these symptoms and risks. Talk to your health care provider about whether hormone replacement therapy is right for you. Follow these instructions at home:  Schedule regular health, dental, and eye exams.  Stay current with your immunizations.  Do not use any tobacco products including cigarettes, chewing tobacco, or electronic cigarettes.  If you are pregnant, do not drink alcohol.  If you are breastfeeding, limit how much and how often you drink alcohol.  Limit alcohol intake to no more than 1 drink per day for nonpregnant women. One drink equals 12 ounces of beer, 5 ounces of wine, or 1 ounces of hard liquor.  Do not use street  drugs.  Do not share needles.  Ask your health care provider for help if you need support or information about quitting drugs.  Tell your health care provider if you often feel depressed.  Tell your health care provider if you have ever been abused or do not feel safe at home. This information is not intended to replace advice given to you by your health care provider. Make sure you discuss any questions you have with your health care provider. Document Released: 12/29/2010 Document Revised: 11/21/2015 Document Reviewed: 03/19/2015 Elsevier Interactive Patient Education  [employer].

## 2017-05-17 ENCOUNTER — Other Ambulatory Visit: Payer: Self-pay | Admitting: Obstetrics and Gynecology

## 2017-05-17 ENCOUNTER — Telehealth: Payer: Self-pay | Admitting: Internal Medicine

## 2017-05-17 DIAGNOSIS — Z1231 Encounter for screening mammogram for malignant neoplasm of breast: Secondary | ICD-10-CM

## 2017-05-17 NOTE — Telephone Encounter (Signed)
Pt given lab results from 11/16, expressed understating and has no questions

## 2017-05-27 ENCOUNTER — Encounter: Payer: Self-pay | Admitting: Radiology

## 2017-06-25 ENCOUNTER — Ambulatory Visit
Admission: RE | Admit: 2017-06-25 | Discharge: 2017-06-25 | Disposition: A | Discharge status: Home / Self Care | Payer: Medicare HMO | Source: Ambulatory Visit | Attending: Obstetrics and Gynecology | Admitting: Obstetrics and Gynecology

## 2017-06-25 DIAGNOSIS — Z1231 Encounter for screening mammogram for malignant neoplasm of breast: Secondary | ICD-10-CM

## 2017-07-06 ENCOUNTER — Ambulatory Visit (INDEPENDENT_AMBULATORY_CARE_PROVIDER_SITE_OTHER): Payer: Medicare HMO | Admitting: Obstetrics and Gynecology

## 2017-07-06 ENCOUNTER — Encounter: Payer: Self-pay | Admitting: Obstetrics and Gynecology

## 2017-07-06 VITALS — BP 124/71 | HR 71 | Ht 60.0 in | Wt 144.0 lb

## 2017-07-06 DIAGNOSIS — Z01419 Encounter for gynecological examination (general) (routine) without abnormal findings: Secondary | ICD-10-CM

## 2017-07-06 NOTE — Progress Notes (Signed)
Subjective:     [name] is a 72 y.o. female with BMI 28 who is here for a comprehensive physical exam. The patient reports no problems. She denies any bleeding or abnormal discharge. She experiences some vaginal dryness at times. She is not sexually active. She denies any urinary incontinence.  Past Medical History:  Diagnosis Date  . Arthritis    KNEES  . CTS (carpal tunnel syndrome)    BILATERAL LEFT > RIGHT (OCC. HAND NUMBNESS)  . Female cystocele   . GERD (gastroesophageal reflux disease)   . H/O hiatal hernia   . History of cervical dysplasia    CIN II  . History of peritonitis    2006--  S/P PERFORATED BOWEL INJURY DURING SURGERY  . Hyperlipidemia    Past Surgical History:  Procedure Laterality Date  . ABDOMINAL HYSTERECTOMY  1991   W/ BILATERAL SALPINGOOPHORECTOMY  . BREAST BIOPSY    . LAPAROSCOPIC CHOLECYSTECTOMY  2006   W/ PERFERATED BOWEL (INJURY)  . NASAL SEPTUM SURGERY  70's   REPAIR DEVIATED SEPTUM  . TUBAL LIGATION  1980'S  . VAGINAL PROLAPSE REPAIR N/A 08/15/2012   Procedure: VAGINAL VAULT SUSPENSION;  Surgeon: Ailene Rud, MD;  Location: [facility] [place];  Service: Urology;  Laterality: N/A;  ANTERIOR VAULT REPAIR WITH XENFORE   Family History  Problem Relation Age of Onset  . Coronary artery disease Mother   . Other Mother        cva  . Hypertension Mother   . Heart disease Mother   . Other Father        CVA  . Stroke Father   . Cancer Sister 98       breast  . Breast cancer Sister   . Cancer Brother        prancreatic  . Cancer Sister 59       breast  . Breast cancer Sister   . Diabetes Other   . Coronary artery disease Other   . Hypertension Other   . Stroke Other   . Cancer Other        breast  . Colon cancer Neg Hx     Social History   Socioeconomic History  . Marital status: Married    Spouse name: Not on file  . Number of children: Not on file  . Years of education: Not on file  . Highest education  level: Not on file  Social Needs  . Financial resource strain: Not on file  . Food insecurity - worry: Not on file  . Food insecurity - inability: Not on file  . Transportation needs - medical: Not on file  . Transportation needs - non-medical: Not on file  Occupational History  . Not on file  Tobacco Use  . Smoking status: Never Smoker  . Smokeless tobacco: Never Used  Substance and Sexual Activity  . Alcohol use: No    Alcohol/week: 0.0 oz    Comment: no  . Drug use: No  . Sexual activity: Not Currently    Birth control/protection: Surgical  Other Topics Concern  . Not on file  Social History Narrative   Married '68. 2 son - '69, '70; 1 son - '71: 2 grandchildren. 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   Health Maintenance  Topic Date Due  . MAMMOGRAM  06/26/2019  . TETANUS/TDAP  11/18/2020  . COLONOSCOPY  03/14/2022  . INFLUENZA VACCINE  Completed  . DEXA SCAN  Completed  . Hepatitis C Screening  Completed  . PNA vac Low Risk Adult  Completed    Review of Systems Pertinent items are noted in HPI.   Objective:  Blood pressure 124/71, pulse 71, height 5' (1.524 m), weight 144 lb (65.3 kg).     GENERAL: Well-developed, well-nourished female in no acute distress.  HEENT: Normocephalic, atraumatic. Sclerae anicteric.  NECK: Supple. Normal thyroid.  LUNGS: Clear to auscultation bilaterally.  HEART: Regular rate and rhythm. BREASTS: Symmetric in size. No palpable masses or lymphadenopathy, skin changes, or nipple drainage. ABDOMEN: Soft, nontender, nondistended. No organomegaly. PELVIC: Normal external female genitalia. Vagina is pink and rugated.  Normal discharge. Vaginal cuff intact. No adnexal mass or tenderness. EXTREMITIES: No cyanosis, clubbing, or edema, 2+ distal pulses.    Assessment:    Healthy female exam.      Plan:    Normal pelvic exam Patient pap smear no longer indicated Patient with normal screening mammogram in December [facility]  sample provided Follow up with PCP as planned See After Visit Summary for Counseling Recommendations

## 2017-10-22 ENCOUNTER — Other Ambulatory Visit: Payer: Self-pay | Admitting: Internal Medicine

## 2017-10-25 DIAGNOSIS — N952 Postmenopausal atrophic vaginitis: Secondary | ICD-10-CM | POA: Diagnosis not present

## 2017-10-25 DIAGNOSIS — N8111 Cystocele, midline: Secondary | ICD-10-CM | POA: Diagnosis not present

## 2017-10-28 ENCOUNTER — Ambulatory Visit: Payer: Self-pay

## 2017-10-28 NOTE — Telephone Encounter (Signed)
Pt c/o dizziness, "like her head is spinning. The dizziness comes and goes. She had an episode of dizziness this morning that went away and then again shortly before calling PCP at approximately 3:45 pm. When she has the episodes she feels like she can wwaalk but has to hold on to objects at her house to keep her stable. She stated last night she was reaching up to a high cabinet in her kitchen and a large plastic bowl with a heavy base hit the right side of her head towaord the fron of there head. She says she does not feel any lumps, bumps or cuts and denies any soreness. She c/o feeling her that her right ear "closed up" and stated the right side of her nose has some pain and pressure. Today she took 2 doses of Tylenol for the sinus pressure.  Earlier in the day she took her BP ans it was 149/89 and her HR was 89.   Care advice given per protocol. She was given an appt for 0800 tomorrow with Jodi Mourning NP. Advised pt to have someone drive her to the appt.   Reason for Disposition . [1] MILD dizziness (e.g., walking normally) AND [2] has NOT been evaluated by physician for this  (Exception: dizziness caused by heat exposure, sudden standing, or poor fluid intake)  Answer Assessment - Initial Assessment Questions 1. DESCRIPTION: "Describe your dizziness."    Feels like head is spinning BP 149/89 2. LIGHTHEADED: "Do you feel lightheaded?" (e.g., somewhat faint, woozy, weak upon standing)     Yes- woozy and weak with standing 3. VERTIGO: "Do you feel like either you or the room is spinning or tilting?" (i.e. vertigo)     She felt like she was spinning  4. SEVERITY: "How bad is it?"  "Do you feel like you are going to faint?" "Can you stand and walk?"   - MILD - walking normally   - MODERATE - interferes with normal activities (e.g., work, school)    - SEVERE - unable to stand, requires support to walk, feels like passing out now.      Mild but was having to hold on to kitchen sink then counter to  get to chair  5. ONSET:  "When did the dizziness begin?"     This am 0630 6. AGGRAVATING FACTORS: "Does anything make it worse?" (e.g., standing, change in head position)     standing 7. HEART RATE: "Can you tell me your heart rate?" "How many beats in 15 seconds?"  (Note: not all patients can do this)       Said she took her BP and it was 89 per minute 8. CAUSE: "What do you think is causing the dizziness?"     The bowl that fell out of the cabinet that hit the right side of head also  9. RECURRENT SYMPTOM: "Have you had dizziness before?" If so, ask: "When was the last time?" "What happened that time?"     Yes last year - went away on its own 10. OTHER SYMPTOMS: "Do you have any other symptoms?" (e.g., fever, chest pain, vomiting, diarrhea, bleeding)       No - felt that one ear is "closed up" right side of nose with pain and pressurre 11. PREGNANCY: "Is there any chance you are pregnant?" "When was your last menstrual period?"       n/a  Protocols used: DIZZINESS [facility]

## 2017-10-29 ENCOUNTER — Ambulatory Visit (INDEPENDENT_AMBULATORY_CARE_PROVIDER_SITE_OTHER): Payer: Medicare HMO | Admitting: Family

## 2017-10-29 ENCOUNTER — Encounter: Payer: Self-pay | Admitting: Family

## 2017-10-29 VITALS — BP 120/90 | HR 68 | Temp 98.1°F | Ht 60.0 in | Wt 141.1 lb

## 2017-10-29 DIAGNOSIS — R42 Dizziness and giddiness: Secondary | ICD-10-CM

## 2017-10-29 MED ORDER — MECLIZINE HCL 12.5 MG PO TABS: 12.5000 mg | ORAL_TABLET | Freq: Three times a day (TID) | ORAL | 0 refills | Status: DC | PRN

## 2017-10-29 MED ORDER — FLUTICASONE PROPIONATE 50 MCG/ACT NA SUSP: 2.0000 | Freq: Every day | NASAL | 6 refills | Status: DC

## 2017-10-29 NOTE — Patient Instructions (Signed)
Vertigo °Vertigo is the feeling that you or your surroundings are moving when they are not. Vertigo can be dangerous if it occurs while you are doing something that could endanger you or others, such as driving. °What are the causes? °This condition is caused by a disturbance in the signals that are sent by your body’s sensory systems to your brain. Different causes of a disturbance can lead to vertigo, including: °· Infections, especially in the inner ear. °· A bad reaction to a drug, or misuse of alcohol and medicines. °· Withdrawal from drugs or alcohol. °· Quickly changing positions, as when lying down or rolling over in bed. °· Migraine headaches. °· Decreased blood flow to the brain. °· Decreased blood pressure. °· Increased pressure in the brain from a head or neck injury, stroke, infection, tumor, or bleeding. °· Central nervous system disorders. ° °What are the signs or symptoms? °Symptoms of this condition usually occur when you move your head or your eyes in different directions. Symptoms may start suddenly, and they usually last for less than a minute. Symptoms may include: °· Loss of balance and falling. °· Feeling like you are spinning or moving. °· Feeling like your surroundings are spinning or moving. °· Nausea and vomiting. °· Blurred vision or double vision. °· Difficulty hearing. °· Slurred speech. °· Dizziness. °· Involuntary eye movement (nystagmus). ° °Symptoms can be mild and cause only slight annoyance, or they can be severe and interfere with daily life. Episodes of vertigo may return (recur) over time, and they are often triggered by certain movements. Symptoms may improve over time. °How is this diagnosed? °This condition may be diagnosed based on medical history and the quality of your nystagmus. Your health care provider may test your eye movements by asking you to quickly change positions to trigger the nystagmus. This may be called the Dix-Hallpike test, head thrust test, or roll test.  You may be referred to a health care provider who specializes in ear, nose, and throat (ENT) problems (otolaryngologist) or a provider who specializes in disorders of the central nervous system (neurologist). °You may have additional testing, including: °· A physical exam. °· Blood tests. °· MRI. °· A CT scan. °· An electrocardiogram (ECG). This records electrical activity in your heart. °· An electroencephalogram (EEG). This records electrical activity in your brain. °· Hearing tests. ° °How is this treated? °Treatment for this condition depends on the cause and the severity of the symptoms. Treatment options include: °· Medicines to treat nausea or vertigo. These are usually used for severe cases. Some medicines that are used to treat other conditions may also reduce or eliminate vertigo symptoms. These include: °? Medicines that control allergies (antihistamines). °? Medicines that control seizures (anticonvulsants). °? Medicines that relieve depression (antidepressants). °? Medicines that relieve anxiety (sedatives). °· Head movements to adjust your inner ear back to normal. If your vertigo is caused by an ear problem, your health care provider may recommend certain movements to correct the problem. °· Surgery. This is rare. ° °Follow these instructions at home: °Safety °· Move slowly.Avoid sudden body or head movements. °· Avoid driving. °· Avoid operating heavy machinery. °· Avoid doing any tasks that would cause danger to you or others if you would have a vertigo episode during the task. °· If you have trouble walking or keeping your balance, try using a cane for stability. If you feel dizzy or unstable, sit down right away. °· Return to your normal activities as told by your   health care provider. Ask your health care provider what activities are safe for you. °General instructions °· Take over-the-counter and prescription medicines only as told by your health care provider. °· Avoid certain positions or  movements as told by your health care provider. °· Drink enough fluid to keep your urine clear or pale yellow. °· Keep all follow-up visits as told by your health care provider. This is important. °Contact a health care provider if: °· Your medicines do not relieve your vertigo or they make it worse. °· You have a fever. °· Your condition gets worse or you develop new symptoms. °· Your family or friends notice any behavioral changes. °· Your nausea or vomiting gets worse. °· You have numbness or a “pins and needles” sensation in part of your body. °Get help right away if: °· You have difficulty moving or speaking. °· You are always dizzy. °· You faint. °· You develop severe headaches. °· You have weakness in your hands, arms, or legs. °· You have changes in your hearing or vision. °· You develop a stiff neck. °· You develop sensitivity to light. °This information is not intended to replace advice given to you by your health care provider. Make sure you discuss any questions you have with your health care provider. °Document Released: 03/25/2005 Document Revised: 11/27/2015 Document Reviewed: 10/08/2014 °Elsevier Interactive Patient Education © 2018 [employer] ° °

## 2017-10-29 NOTE — Progress Notes (Signed)
[name] is a 72 y.o. female with the following history as recorded in EpicCare:  Patient Active Problem List   Diagnosis Date Noted  . Rotator cuff arthropathy of left shoulder 03/24/2017  . Essential hypertension 10/03/2014  . Impingement syndrome of left shoulder region 10/03/2014  . Routine general medical examination at a health care facility 08/27/2014  . Elevated CK 02/02/2014  . Impaired fasting glucose 02/17/2012  . GERD 03/17/2009  . Hyperlipidemia 02/11/2009  . Left knee pain 02/11/2009    Current Outpatient Medications  Medication Sig Dispense Refill  . acetaminophen (TYLENOL) 500 MG tablet Take 500-750 mg by mouth every 6 (six) hours as needed for pain.    . Esomeprazole Magnesium (NEXIUM PO) Take by mouth daily. OTC    . estradiol (ESTRACE) 0.1 MG/GM vaginal cream Place 2 g vaginally 3 (three) times a week. 42.5 g 6  . famotidine (PEPCID) 20 MG tablet TAKE 1 TABLET (20 MG TOTAL) BY MOUTH AT BEDTIME. 30 tablet 2  . hydrochlorothiazide (MICROZIDE) 12.5 MG capsule Take 1 capsule (12.5 mg total) daily by mouth. Need office visit for further refills 90 capsule 3  . Multiple Vitamin (MULTIVITAMIN) tablet Take 1 tablet by mouth every other day.     . Omega-3 Fatty Acids (FISH OIL PO) Take by mouth.    Marland Kitchen OVER THE COUNTER MEDICATION Place 1 suppository rectally daily as needed (for hemmorhoids).    . rosuvastatin (CRESTOR) 20 MG tablet TAKE 1 TABLET (20 MG TOTAL) BY MOUTH DAILY. 90 tablet 1  . tetrahydrozoline (VISINE) 0.05 % ophthalmic solution Place 2 drops into both eyes daily as needed (for dry eyes).    . fluticasone (FLONASE) 50 MCG/ACT nasal spray Place 2 sprays into both nostrils daily. 16 g 6  . meclizine (ANTIVERT) 12.5 MG tablet Take 1 tablet (12.5 mg total) by mouth 3 (three) times daily as needed for dizziness. 30 tablet 0   No current facility-administered medications for this visit.     Allergies: Zocor [simvastatin]; Advil [ibuprofen]; Bran [wheat bran];  Hydrocodone; Hydrocodone-acetaminophen; Lipitor [atorvastatin]; and Pravachol [pravastatin sodium]  Past Medical History:  Diagnosis Date  . Arthritis    KNEES  . CTS (carpal tunnel syndrome)    BILATERAL LEFT > RIGHT (OCC. HAND NUMBNESS)  . Female cystocele   . GERD (gastroesophageal reflux disease)   . H/O hiatal hernia   . History of cervical dysplasia    CIN II  . History of peritonitis    2006--  S/P PERFORATED BOWEL INJURY DURING SURGERY  . Hyperlipidemia     Past Surgical History:  Procedure Laterality Date  . ABDOMINAL HYSTERECTOMY  1991   W/ BILATERAL SALPINGOOPHORECTOMY  . BREAST BIOPSY    . LAPAROSCOPIC CHOLECYSTECTOMY  2006   W/ PERFERATED BOWEL (INJURY)  . NASAL SEPTUM SURGERY  70's   REPAIR DEVIATED SEPTUM  . TUBAL LIGATION  1980'S  . VAGINAL PROLAPSE REPAIR N/A 08/15/2012   Procedure: VAGINAL VAULT SUSPENSION;  Surgeon: Ailene Rud, MD;  Location: [facility];  Service: Urology;  Laterality: N/A;  ANTERIOR VAULT REPAIR WITH XENFORE    Family History  Problem Relation Age of Onset  . Coronary artery disease Mother   . Other Mother        cva  . Hypertension Mother   . Heart disease Mother   . Other Father        CVA  . Stroke Father   . Cancer Sister 18  breast  . Breast cancer Sister   . Cancer Brother        prancreatic  . Cancer Sister 22       breast  . Breast cancer Sister   . Diabetes Other   . Coronary artery disease Other   . Hypertension Other   . Stroke Other   . Cancer Other        breast  . Colon cancer Neg Hx     Social History   Tobacco Use  . Smoking status: Never Smoker  . Smokeless tobacco: Never Used  Substance Use Topics  . Alcohol use: No    Alcohol/week: 0.0 oz    Comment: no    Subjective:  Patient woke up yesterday am feeling "dizzy." Of note, she had a plastic bowl fall out of the cabinet and hit her on the head on Wednesday evening; Notes that by the end of the day, she had started to  feel better; Describes the sensation that the "room was spinning around her." Denies any chest pain or shortness of breath or blurred vision; Notes that when she woke up this morning, she "felt back to normal." Does mention that yesterday, she felt like her right ear was clogged up some." Had taken 2 Tylenol Sinus yesterday; Of note, patient has not taken any of her medications today including her blood pressure medication; states that her blood pressure was normal at urologist earlier this week;   Objective:  Vitals:   10/29/17 0811  BP: 120/90  Pulse: 68  Temp: 98.1 F (36.7 C)  TempSrc: Oral  SpO2: 97%  Weight: 141 lb 1.9 oz (64 kg)  Height: 5' (1.524 m)    General: Well developed, well nourished, in no acute distress  Skin : Warm and dry.  Head: Normocephalic and atraumatic  Eyes: Sclera and conjunctiva clear; pupils round and reactive to light; extraocular movements intact  Ears: External normal; canals clear; tympanic membranes normal  Oropharynx: Pink, supple. No suspicious lesions  Neck: Supple without thyromegaly, adenopathy  Lungs: Respirations unlabored; clear to auscultation bilaterally without wheeze, rales, rhonchi  CVS exam: normal rate and regular rhythm.  Neurologic: Alert and oriented; speech intact; face symmetrical; moves all extremities well; CNII-XII intact without focal deficit   Assessment:  1. Vertigo     Plan:  Suspect benign vertigo; symptoms have almost completely resolved as of this morning; will try treating with Antivert and Flonase; if symptoms return, she is to call back and will update imaging and labs; follow-up as needed.   No follow-ups on file.  No orders of the defined types were placed in this encounter.   Requested Prescriptions   Signed Prescriptions Disp Refills  . meclizine (ANTIVERT) 12.5 MG tablet 30 tablet 0    Sig: Take 1 tablet (12.5 mg total) by mouth 3 (three) times daily as needed for dizziness.  . fluticasone (FLONASE) 50  MCG/ACT nasal spray 16 g 6    Sig: Place 2 sprays into both nostrils daily.

## 2017-11-01 ENCOUNTER — Telehealth: Payer: Self-pay | Admitting: Internal Medicine

## 2017-11-01 NOTE — Telephone Encounter (Signed)
I am glad she is feeling better; she can stop the medications since her symptoms have resolved; if they return, please let me know and we will get imaging done at that time.

## 2017-11-01 NOTE — Telephone Encounter (Signed)
FYI

## 2017-11-01 NOTE — Telephone Encounter (Signed)
Copied from [place] [id number]. Topic: Quick Communication - See Telephone Encounter >> Nov 01, 2017  4:50 PM Rutherford Nail, NT wrote: CRM for notification. See Telephone encounter for: 11/01/17. Patient states that Jodi Mourning wanted her to call back today(11/01/17) and see how she was doing. States she is doing much better. Felt a little bad Saturday(10/30/17) morning, but it corrected itself as the day went on. Still taking the meclizine and flonase.

## 2017-11-02 NOTE — Telephone Encounter (Signed)
Spoke with patient today and info given. 

## 2017-11-29 ENCOUNTER — Ambulatory Visit: Payer: Self-pay | Admitting: *Deleted

## 2017-11-29 NOTE — Telephone Encounter (Signed)
Pt reports still as symptoms of congested sensation in ear and dizziness.Had episode sev days ago and symptoms are milder than they were 1  Month ago. Appointment made for tommorow with laura Valere Dross in the office. Reason for Disposition . [1] MILD dizziness (e.g., walking normally) AND [2] has been evaluated by physician for this  Answer Assessment - Initial Assessment Questions 1. DESCRIPTION: "Describe your dizziness."       Had sensation of room spinning two days ago briefly  2. LIGHTHEADED: "Do you feel lightheaded?" (e.g., somewhat faint, woozy, weak upon standing)     Felt  Slightly woozy several days ago  3. VERTIGO: "Do you feel like either you or the room is spinning or tilting?" (i.e. vertigo)       No  4. SEVERITY: "How bad is it?"  "Do you feel like you are going to faint?" "Can you stand and walk?"   - MILD - walking normally   - MODERATE - interferes with normal activities (e.g., work, school)    - SEVERE - unable to stand, requires support to walk, feels like passing out now.      Mild 5. ONSET:  "When did the dizziness begin?"      Seen 1 month ago for inner ear  Took medications got   6. AGGRAVATING FACTORS: "Does anything make it worse?" (e.g., standing, change in head position)        Standing   7. HEART RATE: "Can you tell me your heart rate?" "How many beats in 15 seconds?"  (Note: not all patients can do this)           Unable  8. CAUSE: "What do you think is causing the dizziness?"         Possibly  Fluid   9. RECURRENT SYMPTOM: "Have you had dizziness before?" If so, ask: "When was the last time?" "What happened that time?"       1  Month    10. OTHER SYMPTOMS: "Do you have any other symptoms?" (e.g., fever, chest pain, vomiting, diarrhea, bleeding)       Sinus  Congestion    11. PREGNANCY: "Is there any chance you are pregnant?" "When was your last menstrual period?"       n/a  Protocols used: DIZZINESS [facility]

## 2017-11-30 ENCOUNTER — Ambulatory Visit (INDEPENDENT_AMBULATORY_CARE_PROVIDER_SITE_OTHER): Payer: Medicare HMO | Admitting: Family

## 2017-11-30 ENCOUNTER — Encounter: Payer: Self-pay | Admitting: Family

## 2017-11-30 VITALS — BP 118/80 | HR 85 | Temp 98.8°F | Ht 60.0 in | Wt 143.0 lb

## 2017-11-30 DIAGNOSIS — R42 Dizziness and giddiness: Secondary | ICD-10-CM | POA: Diagnosis not present

## 2017-11-30 DIAGNOSIS — R29818 Other symptoms and signs involving the nervous system: Secondary | ICD-10-CM | POA: Diagnosis not present

## 2017-11-30 MED ORDER — CEFDINIR 300 MG PO CAPS: 300.0000 mg | ORAL_CAPSULE | Freq: Two times a day (BID) | ORAL | 0 refills | Status: DC

## 2017-11-30 NOTE — Progress Notes (Signed)
[name] is a 72 y.o. female with the following history as recorded in EpicCare:  Patient Active Problem List   Diagnosis Date Noted  . Rotator cuff arthropathy of left shoulder 03/24/2017  . Essential hypertension 10/03/2014  . Impingement syndrome of left shoulder region 10/03/2014  . Routine general medical examination at a health care facility 08/27/2014  . Elevated CK 02/02/2014  . Impaired fasting glucose 02/17/2012  . GERD 03/17/2009  . Hyperlipidemia 02/11/2009  . Left knee pain 02/11/2009    Current Outpatient Medications  Medication Sig Dispense Refill  . acetaminophen (TYLENOL) 500 MG tablet Take 500-750 mg by mouth every 6 (six) hours as needed for pain.    . Esomeprazole Magnesium (NEXIUM PO) Take by mouth daily. OTC    . estradiol (ESTRACE) 0.1 MG/GM vaginal cream Place 2 g vaginally 3 (three) times a week. 42.5 g 6  . famotidine (PEPCID) 20 MG tablet TAKE 1 TABLET (20 MG TOTAL) BY MOUTH AT BEDTIME. 30 tablet 2  . fluticasone (FLONASE) 50 MCG/ACT nasal spray Place 2 sprays into both nostrils daily. 16 g 6  . hydrochlorothiazide (MICROZIDE) 12.5 MG capsule Take 1 capsule (12.5 mg total) daily by mouth. Need office visit for further refills 90 capsule 3  . meclizine (ANTIVERT) 12.5 MG tablet Take 1 tablet (12.5 mg total) by mouth 3 (three) times daily as needed for dizziness. 30 tablet 0  . Multiple Vitamin (MULTIVITAMIN) tablet Take 1 tablet by mouth every other day.     . Omega-3 Fatty Acids (FISH OIL PO) Take by mouth.    Marland Kitchen OVER THE COUNTER MEDICATION Place 1 suppository rectally daily as needed (for hemmorhoids).    . rosuvastatin (CRESTOR) 20 MG tablet TAKE 1 TABLET (20 MG TOTAL) BY MOUTH DAILY. 90 tablet 1  . tetrahydrozoline (VISINE) 0.05 % ophthalmic solution Place 2 drops into both eyes daily as needed (for dry eyes).    . cefdinir (OMNICEF) 300 MG capsule Take 1 capsule (300 mg total) by mouth 2 (two) times daily. 20 capsule 0   No current  facility-administered medications for this visit.     Allergies: Zocor [simvastatin]; Advil [ibuprofen]; Bran [wheat bran]; Hydrocodone; Hydrocodone-acetaminophen; Lipitor [atorvastatin]; and Pravachol [pravastatin sodium]  Past Medical History:  Diagnosis Date  . Arthritis    KNEES  . CTS (carpal tunnel syndrome)    BILATERAL LEFT > RIGHT (OCC. HAND NUMBNESS)  . Female cystocele   . GERD (gastroesophageal reflux disease)   . H/O hiatal hernia   . History of cervical dysplasia    CIN II  . History of peritonitis    2006--  S/P PERFORATED BOWEL INJURY DURING SURGERY  . Hyperlipidemia     Past Surgical History:  Procedure Laterality Date  . ABDOMINAL HYSTERECTOMY  1991   W/ BILATERAL SALPINGOOPHORECTOMY  . BREAST BIOPSY    . LAPAROSCOPIC CHOLECYSTECTOMY  2006   W/ PERFERATED BOWEL (INJURY)  . NASAL SEPTUM SURGERY  70's   REPAIR DEVIATED SEPTUM  . TUBAL LIGATION  1980'S  . VAGINAL PROLAPSE REPAIR N/A 08/15/2012   Procedure: VAGINAL VAULT SUSPENSION;  Surgeon: Ailene Rud, MD;  Location: [facility];  Service: Urology;  Laterality: N/A;  ANTERIOR VAULT REPAIR WITH XENFORE    Family History  Problem Relation Age of Onset  . Coronary artery disease Mother   . Other Mother        cva  . Hypertension Mother   . Heart disease Mother   . Other Father  CVA  . Stroke Father   . Cancer Sister 31       breast  . Breast cancer Sister   . Cancer Brother        prancreatic  . Cancer Sister 64       breast  . Breast cancer Sister   . Diabetes Other   . Coronary artery disease Other   . Hypertension Other   . Stroke Other   . Cancer Other        breast  . Colon cancer Neg Hx     Social History   Tobacco Use  . Smoking status: Never Smoker  . Smokeless tobacco: Never Used  Substance Use Topics  . Alcohol use: No    Alcohol/week: 0.0 oz    Comment: no    Subjective:  Patient was seen with episode of vertigo in early May. Symptoms improved  after taking Antivert for a few days and she had done well until this past weekend; felt similar sensation on Saturday afternoon while cooking dinner; took an Antivert and actually felt better by the evening; was concerned about recurrent frequency of symptoms however; does feel that her ears are clogged up/ increased sinus pain/ pressure; denies any blurred vision or difficulty walking/ speaking/ numbness or tingling when episode occurred;  Objective:  Vitals:   11/30/17 1339  BP: 118/80  Pulse: 85  Temp: 98.8 F (37.1 C)  TempSrc: Oral  SpO2: 96%  Weight: 143 lb 0.6 oz (64.9 kg)  Height: 5' (1.524 m)    General: Well developed, well nourished, in no acute distress  Skin : Warm and dry.  Head: Normocephalic and atraumatic  Eyes: Sclera and conjunctiva clear; pupils round and reactive to light; extraocular movements intact  Ears: External normal; canals clear; tympanic membranes congested bilaterally Oropharynx: Pink, supple. No suspicious lesions  Neck: Supple without thyromegaly, adenopathy  Lungs: Respirations unlabored; clear to auscultation bilaterally without wheeze, rales, rhonchi  CVS exam: normal rate and regular rhythm.  Neurologic: Alert and oriented; speech intact; face symmetrical; moves all extremities well; CNII-XII intact without focal deficit   Assessment:  1. Vertigo   2. Other symptoms and signs involving the nervous system     Plan:  Will update head CT due to recurrent symptoms; Rx for Omnicef 300 mg bid x 10 days; encouraged to start using Flonase regularly; follow-up to be determined- may need to refer to ENT or neurology if symptoms persist.  No follow-ups on file.  Orders Placed This Encounter  Procedures  . CT Head Wo Contrast    Standing Status:   Future    Standing Expiration Date:   03/03/2019    Order Specific Question:   Preferred imaging location?    Answer:   [place]    Order Specific Question:   Radiology Contrast Protocol - do  NOT remove file path    Answer:   [email or web address]    Requested Prescriptions   Signed Prescriptions Disp Refills  . cefdinir (OMNICEF) 300 MG capsule 20 capsule 0    Sig: Take 1 capsule (300 mg total) by mouth 2 (two) times daily.

## 2017-12-06 ENCOUNTER — Ambulatory Visit (INDEPENDENT_AMBULATORY_CARE_PROVIDER_SITE_OTHER)
Admission: RE | Admit: 2017-12-06 | Discharge: 2017-12-06 | Disposition: A | Discharge status: Home / Self Care | Payer: Medicare HMO | Source: Ambulatory Visit | Attending: Family | Admitting: Family

## 2017-12-06 ENCOUNTER — Telehealth: Payer: Self-pay | Admitting: Internal Medicine

## 2017-12-06 DIAGNOSIS — R29818 Other symptoms and signs involving the nervous system: Secondary | ICD-10-CM | POA: Diagnosis not present

## 2017-12-06 DIAGNOSIS — H2511 Age-related nuclear cataract, right eye: Secondary | ICD-10-CM | POA: Diagnosis not present

## 2017-12-06 DIAGNOSIS — Z961 Presence of intraocular lens: Secondary | ICD-10-CM | POA: Diagnosis not present

## 2017-12-06 DIAGNOSIS — H5201 Hypermetropia, right eye: Secondary | ICD-10-CM | POA: Diagnosis not present

## 2017-12-06 DIAGNOSIS — R51 Headache: Secondary | ICD-10-CM | POA: Diagnosis not present

## 2017-12-06 DIAGNOSIS — H25011 Cortical age-related cataract, right eye: Secondary | ICD-10-CM | POA: Diagnosis not present

## 2017-12-06 NOTE — Telephone Encounter (Signed)
Copied from [place] [id number]. Topic: General - Other >> Dec 06, 2017  4:37 PM Mcneil, Ja-Kwan wrote: Reason for CRM: Pt called back regarding ct scan. Pt requesting a call back. Cb# [phone]

## 2017-12-07 NOTE — Telephone Encounter (Signed)
(  FYI)  Spoke with patient today and went back over results. She has no dizziness at this current time. She knows that if it returns to call and next step would be ENT referral.

## 2017-12-07 NOTE — Telephone Encounter (Signed)
Patient was referred by  Mickel Baas for CT scan

## 2017-12-07 NOTE — Telephone Encounter (Signed)
Pt would like a call to go in more detail regarding her CT scan.

## 2018-01-18 DIAGNOSIS — H25011 Cortical age-related cataract, right eye: Secondary | ICD-10-CM | POA: Diagnosis not present

## 2018-01-18 DIAGNOSIS — H25811 Combined forms of age-related cataract, right eye: Secondary | ICD-10-CM | POA: Diagnosis not present

## 2018-01-18 DIAGNOSIS — H2511 Age-related nuclear cataract, right eye: Secondary | ICD-10-CM | POA: Diagnosis not present

## 2018-04-12 ENCOUNTER — Ambulatory Visit (INDEPENDENT_AMBULATORY_CARE_PROVIDER_SITE_OTHER): Payer: Medicare HMO | Admitting: *Deleted

## 2018-04-12 DIAGNOSIS — Z23 Encounter for immunization: Secondary | ICD-10-CM | POA: Diagnosis not present

## 2018-04-21 DIAGNOSIS — H43811 Vitreous degeneration, right eye: Secondary | ICD-10-CM | POA: Diagnosis not present

## 2018-05-11 ENCOUNTER — Ambulatory Visit: Payer: Medicare HMO | Admitting: Internal Medicine

## 2018-05-11 ENCOUNTER — Encounter: Payer: Self-pay | Admitting: Radiology

## 2018-05-12 ENCOUNTER — Ambulatory Visit: Payer: Medicare HMO | Admitting: Internal Medicine

## 2018-05-13 ENCOUNTER — Ambulatory Visit: Payer: Medicare HMO | Admitting: Internal Medicine

## 2018-05-16 ENCOUNTER — Encounter: Payer: Self-pay | Admitting: Internal Medicine

## 2018-05-16 ENCOUNTER — Other Ambulatory Visit (INDEPENDENT_AMBULATORY_CARE_PROVIDER_SITE_OTHER): Payer: Medicare HMO

## 2018-05-16 ENCOUNTER — Ambulatory Visit (INDEPENDENT_AMBULATORY_CARE_PROVIDER_SITE_OTHER): Payer: Medicare HMO | Admitting: Internal Medicine

## 2018-05-16 VITALS — BP 140/84 | HR 76 | Temp 98.5°F | Ht 60.0 in | Wt 145.0 lb

## 2018-05-16 DIAGNOSIS — R7301 Impaired fasting glucose: Secondary | ICD-10-CM | POA: Diagnosis not present

## 2018-05-16 DIAGNOSIS — I10 Essential (primary) hypertension: Secondary | ICD-10-CM | POA: Diagnosis not present

## 2018-05-16 DIAGNOSIS — K2100 Gastro-esophageal reflux disease with esophagitis, without bleeding: Secondary | ICD-10-CM

## 2018-05-16 DIAGNOSIS — E782 Mixed hyperlipidemia: Secondary | ICD-10-CM

## 2018-05-16 DIAGNOSIS — Z0000 Encounter for general adult medical examination without abnormal findings: Secondary | ICD-10-CM | POA: Diagnosis not present

## 2018-05-16 LAB — COMPREHENSIVE METABOLIC PANEL
ALBUMIN: 4.5 g/dL (ref 3.5–5.2)
ALT: 28 U/L (ref 0–35)
AST: 27 U/L (ref 0–37)
Alkaline Phosphatase: 55 U/L (ref 39–117)
BILIRUBIN TOTAL: 1.1 mg/dL (ref 0.2–1.2)
BUN: 12 mg/dL (ref 6–23)
CALCIUM: 10.5 mg/dL (ref 8.4–10.5)
CHLORIDE: 98 meq/L (ref 96–112)
CO2: 34 mEq/L — ABNORMAL HIGH (ref 19–32)
CREATININE: 0.78 mg/dL (ref 0.40–1.20)
GFR: 93.2 mL/min (ref >60.00)
Glucose, Bld: 96 mg/dL (ref 70–99)
Potassium: 3.4 mEq/L — ABNORMAL LOW (ref 3.5–5.1)
Sodium: 141 mEq/L (ref 135–145)
Total Protein: 7.3 g/dL (ref 6.0–8.3)

## 2018-05-16 LAB — CBC
HCT: 42.7 % (ref 36.0–46.0)
HEMOGLOBIN: 14.4 g/dL (ref 12.0–15.0)
MCHC: 33.7 g/dL (ref 30.0–36.0)
MCV: 90.2 fl (ref 78.0–100.0)
PLATELETS: 289 10*3/uL (ref 150.0–400.0)
RBC: 4.73 Mil/uL (ref 3.87–5.11)
RDW: 13.7 % (ref 11.5–15.5)
WBC: 5.6 10*3/uL (ref 4.0–10.5)

## 2018-05-16 LAB — LIPID PANEL
CHOLESTEROL: 227 mg/dL — AB (ref 0–200)
HDL: 58.8 mg/dL (ref >39.00)
LDL Cholesterol: 140 mg/dL — ABNORMAL HIGH (ref 0–99)
NonHDL: 168.29
TRIGLYCERIDES: 140 mg/dL (ref 0.0–149.0)
Total CHOL/HDL Ratio: 4
VLDL: 28 mg/dL (ref 0.0–40.0)

## 2018-05-16 LAB — HEMOGLOBIN A1C: Hgb A1c MFr Bld: 6.1 % (ref 4.6–6.5)

## 2018-05-16 LAB — TSH: TSH: 1.74 u[IU]/mL (ref 0.35–4.50)

## 2018-05-16 MED ORDER — ROSUVASTATIN CALCIUM 20 MG PO TABS: 20.0000 mg | ORAL_TABLET | Freq: Every day | ORAL | 3 refills | Status: DC

## 2018-05-16 MED ORDER — FAMOTIDINE 20 MG PO TABS: 20.0000 mg | ORAL_TABLET | Freq: Every day | ORAL | 2 refills | Status: DC

## 2018-05-16 MED ORDER — FLUTICASONE PROPIONATE 50 MCG/ACT NA SUSP: 2.0000 | Freq: Every day | NASAL | 6 refills | Status: DC

## 2018-05-16 MED ORDER — HYDROCHLOROTHIAZIDE 12.5 MG PO CAPS: 12.5000 mg | ORAL_CAPSULE | Freq: Every day | ORAL | 3 refills | Status: DC

## 2018-05-16 NOTE — Progress Notes (Signed)
   Subjective:    Patient ID: [name], female    DOB: 02-02-1946, 72 y.o.   MRN: [id number]  HPI Here for medicare wellness and physical, no new complaints. Please see A/P for status and treatment of chronic medical problems.   Diet: heart healthy Physical activity: sedentary Depression/mood screen: negative Hearing: intact to whispered voice Visual acuity: grossly normal, recent cataract surgery right eye, performs annual eye exam  ADLs: capable Fall risk: low Home safety: good Cognitive evaluation: intact to orientation, naming, recall and repetition EOL planning: adv directives discussed  I have personally reviewed and have noted 1. The patient's medical and social history - reviewed today no changes 2. Their use of alcohol, tobacco or illicit drugs 3. Their current medications and supplements 4. The patient's functional ability including ADL's, fall risks, home safety risks and hearing or visual impairment. 5. Diet and physical activities 6. Evidence for depression or mood disorders 7. Care team reviewed and updated (available in snapshot)  Review of Systems  Constitutional: Negative.   HENT: Negative.   Eyes: Negative.   Respiratory: Negative for cough, chest tightness and shortness of breath.   Cardiovascular: Negative for chest pain, palpitations and leg swelling.  Gastrointestinal: Negative for abdominal distention, abdominal pain, constipation, diarrhea, nausea and vomiting.  Musculoskeletal: Negative.   Skin: Negative.   Neurological: Negative.   Psychiatric/Behavioral: Negative.       Objective:   Physical Exam  Constitutional: She is oriented to person, place, and time. She appears well-developed and well-nourished.  HENT:  Head: Normocephalic and atraumatic.  Eyes: EOM are normal.  Neck: Normal range of motion.  Cardiovascular: Normal rate and regular rhythm.  Pulmonary/Chest: Effort normal and breath sounds normal. No respiratory distress. She has no  wheezes. She has no rales.  Abdominal: Soft. Bowel sounds are normal. She exhibits no distension. There is no tenderness. There is no rebound.  Musculoskeletal: She exhibits no edema.  Neurological: She is alert and oriented to person, place, and time. Coordination normal.  Skin: Skin is warm and dry.   Vitals:   05/16/18 1429  BP: 140/84  Pulse: 76  Temp: 98.5 F (36.9 C)  TempSrc: Oral  SpO2: 96%  Weight: 145 lb (65.8 kg)  Height: 5' (1.524 m)      Assessment & Plan:

## 2018-05-16 NOTE — Patient Instructions (Signed)

## 2018-05-17 NOTE — Assessment & Plan Note (Signed)
Checking CMP and adjust HCTZ as needed.

## 2018-05-17 NOTE — Assessment & Plan Note (Signed)
Flu shot up to date. Pneumonia complete. Shingrix counseled. Tetanus up to date. Colonoscopy up to date. Mammogram up to date, pap smear aged out and dexa up to date. Counseled about sun safety and mole surveillance. Counseled about the dangers of distracted driving. Given 10 year screening recommendations.

## 2018-05-17 NOTE — Assessment & Plan Note (Signed)
Taking crestor a few times per week. Checking lipid panel and adjust as needed.

## 2018-05-17 NOTE — Assessment & Plan Note (Signed)
Taking nexium and pepcid prn which is working well. Given prior dilation should stay on PPI indefinitely.

## 2018-05-17 NOTE — Assessment & Plan Note (Signed)
Checking HgA1c and adjust as needed.  

## 2018-05-23 ENCOUNTER — Other Ambulatory Visit: Payer: Self-pay | Admitting: Internal Medicine

## 2018-05-23 DIAGNOSIS — Z1231 Encounter for screening mammogram for malignant neoplasm of breast: Secondary | ICD-10-CM

## 2018-07-06 ENCOUNTER — Ambulatory Visit
Admission: RE | Admit: 2018-07-06 | Discharge: 2018-07-06 | Disposition: A | Discharge status: Home / Self Care | Payer: Medicare HMO | Source: Ambulatory Visit | Attending: Internal Medicine | Admitting: Internal Medicine

## 2018-07-06 DIAGNOSIS — Z1231 Encounter for screening mammogram for malignant neoplasm of breast: Secondary | ICD-10-CM

## 2018-07-13 ENCOUNTER — Encounter: Payer: Self-pay | Admitting: Family Medicine

## 2018-07-13 ENCOUNTER — Other Ambulatory Visit (HOSPITAL_COMMUNITY)
Admission: RE | Admit: 2018-07-13 | Discharge: 2018-07-13 | Disposition: A | Discharge status: Home / Self Care | Payer: Medicare HMO | Source: Ambulatory Visit | Attending: Family Medicine | Admitting: Family Medicine

## 2018-07-13 ENCOUNTER — Ambulatory Visit (INDEPENDENT_AMBULATORY_CARE_PROVIDER_SITE_OTHER): Payer: Medicare HMO | Admitting: Family Medicine

## 2018-07-13 VITALS — BP 120/72 | HR 72 | Wt 142.0 lb

## 2018-07-13 DIAGNOSIS — Z01419 Encounter for gynecological examination (general) (routine) without abnormal findings: Secondary | ICD-10-CM | POA: Insufficient documentation

## 2018-07-13 DIAGNOSIS — Z803 Family history of malignant neoplasm of breast: Secondary | ICD-10-CM | POA: Diagnosis not present

## 2018-07-13 DIAGNOSIS — I10 Essential (primary) hypertension: Secondary | ICD-10-CM | POA: Diagnosis not present

## 2018-07-13 DIAGNOSIS — Z90710 Acquired absence of both cervix and uterus: Secondary | ICD-10-CM

## 2018-07-13 NOTE — Progress Notes (Signed)
   GYNECOLOGY ANNUAL PREVENTATIVE CARE ENCOUNTER NOTE  Subjective:   [name] is a 73 y.o. [phone] female here for a routine annual gynecologic exam.  Current complaints: vaginal discharge- 2 weeks ago, no itching, burning, or irritation associated. Resolved at this point per patient..   Denies abnormal vaginal bleeding, discharge, pelvic pain, problems with intercourse or other gynecologic concerns.    Gynecologic History No LMP recorded. Patient has had a hysterectomy. Contraception: status post hysterectomy Last Pap: 2016 NIL Last mammogram: 06/2018. Results were: normal  The following portions of the patient's history were reviewed and updated as appropriate: allergies, current medications, past family history, past medical history, past social history, past surgical history and problem list.  Review of Systems Pertinent items are noted in HPI.   Objective:  BP 120/72   Pulse 72   Wt 142 lb (64.4 kg)   BMI 27.73 kg/m  CONSTITUTIONAL: Well-developed, well-nourished female in no acute distress.  HENT:  Normocephalic, atraumatic, External right and left ear normal. Oropharynx is clear and moist EYES:  No scleral icterus.  NECK: Normal range of motion, supple, no masses.  Normal thyroid.  SKIN: Skin is warm and dry. No rash noted. Not diaphoretic. No erythema. No pallor. NEUROLOGIC: Alert and oriented to person, place, and time. Normal reflexes, muscle tone coordination. No cranial nerve deficit noted. PSYCHIATRIC: Normal mood and affect. Normal behavior. Normal judgment and thought content. CARDIOVASCULAR: Normal heart rate noted, regular rhythm. 2+ distal pulses. RESPIRATORY: Effort and breath sounds normal, no problems with respiration noted. BREASTS: Symmetric in size. No masses, skin changes, nipple drainage, or lymphadenopathy. ABDOMEN: Soft,  no distention noted.  No tenderness, rebound or guarding.  PELVIC: Normal appearing external genitalia; atrophic vaginal mucosa  and cervix.  No abnormal discharge noted.  Pap smear obtained.  Surgically absent uterus, no adnexal tenderness. MUSCULOSKELETAL: Normal range of motion.    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 Will run pap for BV and yeast. Pap- performed today with HPV, this will be patient's last pap. 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 Hysterectomy was in 1991. This will be last pap due to being > 20 years from procedure and multiple documented negatives Dexa- 2017, up to date Mammo- 2020, up to date. Recommend shared decision making about when to stop screening the setting of family history  Colon cancer screening- up to date Yearly blood work with PCP Reviewed care gaps, none noted Routine preventative health maintenance measures emphasized.   Please refer to After Visit Summary for other counseling recommendations.   Return in about 1 year (around 07/14/2019) for Yearly wellness exam.  Caren Macadam, MD, MPH, ABFM Attending [place] for [facility]

## 2018-07-13 NOTE — Progress Notes (Signed)
Vaginal Discharge for a couple of days Mammogram completed 06/2018 negative

## 2018-07-14 LAB — CYTOLOGY - PAP
Bacterial vaginitis: NEGATIVE
Diagnosis: NEGATIVE
HPV (WINDOPATH): NOT DETECTED

## 2018-07-18 ENCOUNTER — Telehealth: Payer: Self-pay

## 2018-07-18 DIAGNOSIS — R300 Dysuria: Secondary | ICD-10-CM

## 2018-07-18 NOTE — Telephone Encounter (Signed)
Spoke with patient and she will wait until she comes in on Wed to do urine culture.

## 2018-07-18 NOTE — Telephone Encounter (Signed)
Copied from [place] [id number]. Topic: General - Other >> Jul 18, 2018 11:16 AM Yvette Rack wrote: Reason for CRM: pt calling stating that she went to the OBGYN on Wednesday and she was complaining of pain  when she is sitting on toilet the OBGYN advised pt to get urine checked by provider please call pt at [phone] >> Jul 18, 2018 11:23 AM Yvette Rack wrote: Pt has and appt on Wednesday with Valere Dross to check her thigh if the test can wait until that day she can stop by lab

## 2018-07-18 NOTE — Telephone Encounter (Signed)
She can stop by and drop of urine sample. I will put in U/A and urine culture order for her.

## 2018-07-20 ENCOUNTER — Ambulatory Visit (INDEPENDENT_AMBULATORY_CARE_PROVIDER_SITE_OTHER): Payer: Medicare HMO | Admitting: Family

## 2018-07-20 ENCOUNTER — Encounter: Payer: Self-pay | Admitting: Family

## 2018-07-20 ENCOUNTER — Other Ambulatory Visit (INDEPENDENT_AMBULATORY_CARE_PROVIDER_SITE_OTHER): Payer: Medicare HMO

## 2018-07-20 VITALS — BP 110/82 | HR 79 | Temp 98.5°F | Ht 60.0 in | Wt 143.4 lb

## 2018-07-20 DIAGNOSIS — R300 Dysuria: Secondary | ICD-10-CM | POA: Diagnosis not present

## 2018-07-20 DIAGNOSIS — D179 Benign lipomatous neoplasm, unspecified: Secondary | ICD-10-CM

## 2018-07-20 DIAGNOSIS — H43811 Vitreous degeneration, right eye: Secondary | ICD-10-CM | POA: Diagnosis not present

## 2018-07-20 LAB — URINALYSIS
Bilirubin Urine: NEGATIVE
Ketones, ur: NEGATIVE
Leukocytes, UA: NEGATIVE
Nitrite: NEGATIVE
Specific Gravity, Urine: <=1.005 — AB (ref 1.000–1.030)
TOTAL PROTEIN, URINE-UPE24: NEGATIVE
Urine Glucose: NEGATIVE
Urobilinogen, UA: 0.2 (ref 0.0–1.0)
pH: 6.5 (ref 5.0–8.0)

## 2018-07-20 NOTE — Progress Notes (Signed)
[name] is a 73 y.o. female with the following history as recorded in EpicCare:  Patient Active Problem List   Diagnosis Date Noted  . S/P hysterectomy- history of CIN2 07/13/2018  . Family history of breast cancer 07/13/2018  . Rotator cuff arthropathy of left shoulder 03/24/2017  . Essential hypertension 10/03/2014  . Impingement syndrome of left shoulder region 10/03/2014  . Elevated CK 02/02/2014  . Impaired fasting glucose 02/17/2012  . GERD 03/17/2009  . Hyperlipidemia 02/11/2009  . Left knee pain 02/11/2009    Current Outpatient Medications  Medication Sig Dispense Refill  . acetaminophen (TYLENOL) 500 MG tablet Take 500-750 mg by mouth every 6 (six) hours as needed for pain.    . Esomeprazole Magnesium (NEXIUM PO) Take by mouth daily. OTC    . estradiol (ESTRACE) 0.1 MG/GM vaginal cream Place 2 g vaginally 3 (three) times a week. 42.5 g 6  . famotidine (PEPCID) 20 MG tablet Take 1 tablet (20 mg total) by mouth at bedtime. 90 tablet 2  . fluticasone (FLONASE) 50 MCG/ACT nasal spray Place 2 sprays into both nostrils daily. 48 g 6  . hydrochlorothiazide (MICROZIDE) 12.5 MG capsule Take 1 capsule (12.5 mg total) by mouth daily. Need office visit for further refills 90 capsule 3  . meclizine (ANTIVERT) 12.5 MG tablet Take 1 tablet (12.5 mg total) by mouth 3 (three) times daily as needed for dizziness. 30 tablet 0  . Multiple Vitamin (MULTIVITAMIN) tablet Take 1 tablet by mouth every other day.     . Omega-3 Fatty Acids (FISH OIL PO) Take by mouth.    Marland Kitchen OVER THE COUNTER MEDICATION Place 1 suppository rectally daily as needed (for hemmorhoids).    . rosuvastatin (CRESTOR) 20 MG tablet Take 1 tablet (20 mg total) by mouth daily. 90 tablet 3  . tetrahydrozoline (VISINE) 0.05 % ophthalmic solution Place 2 drops into both eyes daily as needed (for dry eyes).     No current facility-administered medications for this visit.     Allergies: Zocor [simvastatin]; Advil [ibuprofen];  Bran [wheat bran]; Hydrocodone; Hydrocodone-acetaminophen; Lipitor [atorvastatin]; and Pravachol [pravastatin sodium]  Past Medical History:  Diagnosis Date  . Arthritis    KNEES  . CTS (carpal tunnel syndrome)    BILATERAL LEFT > RIGHT (OCC. HAND NUMBNESS)  . Female cystocele   . GERD (gastroesophageal reflux disease)   . H/O hiatal hernia   . History of cervical dysplasia    CIN II  . History of peritonitis    2006--  S/P PERFORATED BOWEL INJURY DURING SURGERY  . Hyperlipidemia     Past Surgical History:  Procedure Laterality Date  . ABDOMINAL HYSTERECTOMY  1991   W/ BILATERAL SALPINGOOPHORECTOMY  . BREAST BIOPSY    . LAPAROSCOPIC CHOLECYSTECTOMY  2006   W/ PERFERATED BOWEL (INJURY)  . NASAL SEPTUM SURGERY  70's   REPAIR DEVIATED SEPTUM  . TUBAL LIGATION  1980'S  . VAGINAL PROLAPSE REPAIR N/A 08/15/2012   Procedure: VAGINAL VAULT SUSPENSION;  Surgeon: Ailene Rud, MD;  Location[facility];  Service: Urology;  Laterality: N/A;  ANTERIOR VAULT REPAIR WITH XENFORE    Family History  Problem Relation Age of Onset  . Coronary artery disease Mother   . Other Mother        cva  . Hypertension Mother   . Heart disease Mother   . Other Father        CVA  . Stroke Father   . Cancer Sister 59  breast  . Breast cancer Sister   . Cancer Brother        prancreatic  . Cancer Sister 91       breast  . Breast cancer Sister   . Diabetes Other   . Coronary artery disease Other   . Hypertension Other   . Stroke Other   . Cancer Other        breast  . Colon cancer Neg Hx     Social History   Tobacco Use  . Smoking status: Never Smoker  . Smokeless tobacco: Never Used  Substance Use Topics  . Alcohol use: No    Alcohol/week: 0.0 standard drinks    Comment: no    Subjective:  Patient presents with 2 concerns:  1) has had "lump" on inner left thigh for at least 6 months/ "maybe longer"- became concerned that area has increased in size  recently; no pain at area of concern; no drainage or warmth at area or concern; 2) Wanted to make sure she does not have a UTI; not currently symptomatic but recently had some episodes of burning on urination; no blood in urine, no fever;    Objective:  Vitals:   07/20/18 0921  BP: 110/82  Pulse: 79  Temp: 98.5 F (36.9 C)  TempSrc: Oral  SpO2: 97%  Weight: 143 lb 6.4 oz (65 kg)  Height: 5' (1.524 m)    General: Well developed, well nourished, in no acute distress  Skin : Warm and dry. Suspect small lipoma on inner left thigh Head: Normocephalic and atraumatic  Eyes: Sclera and conjunctiva clear; pupils round and reactive to light; extraocular movements intact  Ears: External normal; canals clear; tympanic membranes normal  Oropharynx: Pink, supple. No suspicious lesions  Neck: Supple without thyromegaly, adenopathy  Lungs: Respirations unlabored;  Neurologic: Alert and oriented; speech intact; face symmetrical; moves all extremities well; CNII-XII intact without focal deficit   Assessment:  1. Lipoma, unspecified site   2. Dysuria     Plan:  1. Reassurance; will check ultrasound but suspect will be okay to "watch and wait"; 2. U/A not concerning- urine culture is pending; will be in touch with those results.  No follow-ups on file.  Orders Placed This Encounter  Procedures  . [place] LT LOWER EXTREM LTD SOFT TISSUE NON VASCULAR    Standing Status:   Future    Standing Expiration Date:   09/18/2019    Order Specific Question:   Reason for Exam (SYMPTOM  OR DIAGNOSIS REQUIRED)    Answer:   ? lipoma left inner thigh    Order Specific Question:   Preferred imaging location?    Answer:   [facility]    Requested Prescriptions    No prescriptions requested or ordered in this encounter

## 2018-07-20 NOTE — Patient Instructions (Signed)
Lipoma    A lipoma is a noncancerous (benign) tumor that is made up of fat cells. This is a very common type of soft-tissue growth. Lipomas are usually found under the skin (subcutaneous). They may occur in any tissue of the body that contains fat. Common areas for lipomas to appear include the back, shoulders, buttocks, and thighs.   Lipomas grow slowly, and they are usually painless. Most lipomas do not cause problems and do not require treatment.  What are the causes?  The cause of this condition is not known.  What increases the risk?  You are more likely to develop this condition if:   You are 40-60 years old.   You have a family history of lipomas.  What are the signs or symptoms?  A lipoma usually appears as a small, round bump under the skin. In most cases, the lump will:   Feel soft or rubbery.   Not cause pain or other symptoms.  However, if a lipoma is located in an area where it pushes on nerves, it can become painful or cause other symptoms.  How is this diagnosed?  A lipoma can usually be diagnosed with a physical exam. You may also have tests to confirm the diagnosis and to rule out other conditions. Tests may include:   Imaging tests, such as a CT scan or MRI.   Removal of a tissue sample to be looked at under a microscope (biopsy).  How is this treated?  Treatment for this condition depends on the size of the lipoma and whether it is causing any symptoms.   For small lipomas that are not causing problems, no treatment is needed.   If a lipoma is bigger or it causes problems, surgery may be done to remove the lipoma. Lipomas can also be removed to improve appearance. Most often, the procedure is done after applying a medicine that numbs the area (local anesthetic).  Follow these instructions at home:   Watch your lipoma for any changes.   Keep all follow-up visits as told by your health care provider. This is important.  Contact a health care provider if:   Your lipoma becomes larger or  hard.   Your lipoma becomes painful, red, or increasingly swollen. These could be signs of infection or a more serious condition.  Get help right away if:   You develop tingling or numbness in an area near the lipoma. This could indicate that the lipoma is causing nerve damage.  Summary   A lipoma is a noncancerous tumor that is made up of fat cells.   Most lipomas do not cause problems and do not require treatment.   If a lipoma is bigger or it causes problems, surgery may be done to remove the lipoma.  This information is not intended to replace advice given to you by your health care provider. Make sure you discuss any questions you have with your health care provider.  Document Released: 06/05/2002 Document Revised: 06/01/2017 Document Reviewed: 06/01/2017  Elsevier Interactive Patient Education  2019 Elsevier Inc.

## 2018-07-21 LAB — URINE CULTURE
MICRO NUMBER:: 88956
Result:: NO GROWTH
SPECIMEN QUALITY:: ADEQUATE

## 2018-07-22 ENCOUNTER — Ambulatory Visit
Admission: RE | Admit: 2018-07-22 | Discharge: 2018-07-22 | Disposition: A | Discharge status: Home / Self Care | Payer: Medicare HMO | Source: Ambulatory Visit | Attending: Family | Admitting: Family

## 2018-07-22 DIAGNOSIS — R2242 Localized swelling, mass and lump, left lower limb: Secondary | ICD-10-CM | POA: Diagnosis not present

## 2018-07-22 DIAGNOSIS — D179 Benign lipomatous neoplasm, unspecified: Secondary | ICD-10-CM

## 2018-07-22 NOTE — Progress Notes (Signed)
Patient aware.

## 2018-09-29 ENCOUNTER — Ambulatory Visit: Payer: Self-pay | Admitting: Internal Medicine

## 2018-09-29 NOTE — Telephone Encounter (Signed)
Patient informed of MD response and stated understanding  

## 2018-09-29 NOTE — Telephone Encounter (Signed)
Can take tylenol for neck pain and stiffness. Are there any broken teeth or swollen gums or pus draining in her mouth? We can do virtual visit if she likes.

## 2018-09-29 NOTE — Telephone Encounter (Signed)
Pt. Reports she started having left sided neck pain last week, but is getting worse. Felt like this is dental pain and called her dentist. They told her they are not seeing pt.'s, and told her to call her PCP. Reports she rinsed her mouth with peroxide last night and her mouth and neck feel better. Is concerned she may need an antibiotic if she has an infection. No injury to neck. No answer on Flow coordinator line. Will forward triage for review. Please advise pt.  Answer Assessment - Initial Assessment Questions 1. ONSET: "When did the pain begin?"      Started last week but is getting worse 2. LOCATION: "Where does it hurt?"      Left neck and mouth - had dental work in Feb. 3. PATTERN "Does the pain come and go, or has it been constant since it started?"       Comes and goes 4. SEVERITY: "How bad is the pain?"  (Scale 1-10; or mild, moderate, severe)   - MILD (1-3): doesn't interfere with normal activities    - MODERATE (4-7): interferes with normal activities or awakens from sleep    - SEVERE (8-10):  excruciating pain, unable to do any normal activities      Tylenol helps   3 5. RADIATION: "Does the pain go anywhere else, shoot into your arms?"     No 6. CORD SYMPTOMS: "Any weakness or numbness of the arms or legs?"     No 7. CAUSE: "What do you think is causing the neck pain?"     Maybe dental 8. NECK OVERUSE: "Any recent activities that involved turning or twisting the neck?"     NO 9. OTHER SYMPTOMS: "Do you have any other symptoms?" (e.g., headache, fever, chest pain, difficulty breathing, neck swelling)     Lower left tooth pain 10. PREGNANCY: "Is there any chance you are pregnant?" "When was your last menstrual period?"       No  Protocols used: NECK PAIN OR STIFFNESS-A-AH

## 2018-10-12 ENCOUNTER — Telehealth: Payer: Self-pay | Admitting: *Deleted

## 2018-10-12 NOTE — Telephone Encounter (Signed)
Yes that could casue the neck pain for sure.  Also neck can cause the shoulder pain.  Difficult to assess

## 2018-10-12 NOTE — Telephone Encounter (Signed)
Discussed with pt

## 2018-10-12 NOTE — Telephone Encounter (Signed)
Copied from [place] [phone]. Topic: General - Other >> Oct 11, 2018 12:26 PM Celene Kras A wrote: Reason for CRM: Pt called about some problems with her rotator cuff and was wondering if that could be the cause of her neck stiffness. Pt would like a call back. Please advise.

## 2018-10-17 ENCOUNTER — Encounter: Payer: Self-pay | Admitting: Internal Medicine

## 2018-10-17 ENCOUNTER — Ambulatory Visit: Payer: Self-pay | Admitting: *Deleted

## 2018-10-17 ENCOUNTER — Ambulatory Visit (INDEPENDENT_AMBULATORY_CARE_PROVIDER_SITE_OTHER): Payer: Medicare HMO | Admitting: Internal Medicine

## 2018-10-17 DIAGNOSIS — R6883 Chills (without fever): Secondary | ICD-10-CM

## 2018-10-17 NOTE — Telephone Encounter (Signed)
I have scheduled patient for 3:20pm with Dr. Sharlet Salina.  Patient is going to use her son's smart phone if he is able to get to her house in time.  If not, patient is going to call back to reschedule for a time that will work for her son.

## 2018-10-17 NOTE — Telephone Encounter (Signed)
Noted  

## 2018-10-17 NOTE — Assessment & Plan Note (Signed)
Does sound more like being cold with low temps at night time. No other symptoms to suggest coronavirus. Counseled about symptoms and monitoring. Advised on the importance of social distancing and contacting the office if any symptoms of coronavirus develop.

## 2018-10-17 NOTE — Progress Notes (Signed)
Virtual Visit via Video Note  I connected with [name] on 10/17/18 at  3:20 PM EDT by a video enabled telemedicine application and verified that I am speaking with the correct person using two identifiers.   I discussed the limitations of evaluation and management by telemedicine and the availability of in person appointments. The patient expressed understanding and agreed to proceed.  History of Present Illness: The patient is a 73 y.o. female with visit for chills and neck pain. Started last week with the neck pain and the chills started last night. She had another chill this morning. Checked her temperature today and it was 98.1 F. She denies fevers or SOB or cough. Denies headaches or body aches. Has not as much problems with the neck pain now. Denies taking any tylenol or ibuprofen prior to the temperature check today. Overall it is improving. Has tried nothing  Observations/Objective: Appearance: normal, breathing appears normal, casual grooming, abdomen does not appear distended, throat normal, mental status is A and O times 3  Assessment and Plan: See problem oriented charting  Follow Up Instructions: monitor health and does not sound typical for coronavirus and is likely related to low temps at night recently rather than illness  I discussed the assessment and treatment plan with the patient. The patient was provided an opportunity to ask questions and all were answered. The patient agreed with the plan and demonstrated an understanding of the instructions.   The patient was advised to call back or seek an in-person evaluation if the symptoms worsen or if the condition fails to improve as anticipated.  Hoyt Koch, MD

## 2018-10-17 NOTE — Telephone Encounter (Signed)
Pt left message on COVID voicemail stating she was having "coldness or chills"; the pt as-lso said that she had neck pain last week; she would like to know if these are symptoms;she answers "no" to all questions on [facility] Corona Virus Eval; nurse triage protocol initiated, and recommendations made per protocol; she verbalized understanding; she normally sees DR Pricilla Holm, [facility]; will route to office for notification  Reason for Disposition . COVID-19, questions about  Answer Assessment - Initial Assessment Questions 1. COVID-19 DIAGNOSIS: "Who made your Coronavirus (COVID-19) diagnosis?" "Was it confirmed by a positive lab test?" If not diagnosed by a HCP, ask "Are there lots of cases (community spread) where you live?" (See public health department website, if unsure)   * MAJOR community spread: high number of cases; numbers of cases are increasing; many people hospitalized.   * MINOR community spread: low number of cases; not increasing; few or no people hospitalized     major . ONSET: "When did the COVID-19 symptoms start?"     10/16/2018 3. WORST SYMPTOM: "What is your worst symptom?" (e.g., cough, fever, shortness of breath, muscle aches)     chills 4. COUGH: "How bad is the cough?"    yes, sinus drainage; mild 5. FEVER: "Do you have a fever?" If so, ask: "What is your temperature, how was it measured, and when did it start?"    Not taken 6. RESPIRATORY STATUS: "Describe your breathing?" (e.g., shortness of breath, wheezing, unable to speak)      mild 7. BETTER-SAME-WORSE: "Are you getting better, staying the same or getting worse compared to yesterday?"  If getting worse, ask, "In what way?"     worse 8. HIGH RISK DISEASE: "Do you have any chronic medical problems?" (e.g., asthma, heart or lung disease, weak immune system, etc.)    hypertension 9. PREGNANCY: "Is there any chance you are pregnant?" "When was your last menstrual period?"    no 10. OTHER SYMPTOMS: "Do you have  any other symptoms?"  (e.g., runny nose, headache, sore throat, loss of smell)       no  Protocols used: CORONAVIRUS (COVID-19) DIAGNOSED OR SUSPECTED-A-AH

## 2018-11-14 ENCOUNTER — Telehealth: Payer: Self-pay | Admitting: Internal Medicine

## 2018-11-14 MED ORDER — ESOMEPRAZOLE MAGNESIUM 40 MG PO CPDR: 40.0000 mg | DELAYED_RELEASE_CAPSULE | Freq: Two times a day (BID) | ORAL | 1 refills | Status: DC

## 2018-11-14 NOTE — Telephone Encounter (Signed)
Okay to refill nexium?

## 2018-11-14 NOTE — Telephone Encounter (Signed)
Sent in nexium 40mg  BID which is what was prescribed in the past

## 2018-11-14 NOTE — Telephone Encounter (Signed)
Copied from [place] [id number]. Topic: Quick Communication - Rx Refill/Question >> Nov 14, 2018 11:35 AM Mathis Bud wrote: Medication: Esomeprazole Magnesium ([place]   Has the patient contacted their pharmacy? Over the counter. Patient would like to try a generic version.  Patient wants to know how much.    Preferred Pharmacy (with phone number or street name): [id number] - [place], [place] [phone] (Phone) [phone] (Fax)    Agent: Please be advised that RX refills may take up to 3 business days. We ask that you follow-up with your pharmacy.

## 2018-11-14 NOTE — Telephone Encounter (Signed)
Fine

## 2018-11-23 ENCOUNTER — Ambulatory Visit: Payer: Self-pay | Admitting: *Deleted

## 2018-11-23 NOTE — Telephone Encounter (Signed)
Patient's son has potential exposure at work and he has been tested. They are awaiting results and patient has questions about what she should be doing.   Reason for Disposition . [1] No COVID-19 EXPOSURE BUT [2] living with someone who was exposed and who has no fever or cough  Answer Assessment - Initial Assessment Questions 1. CLOSE CONTACT: "Who is the person with the confirmed or suspected COVID-19 infection that you were exposed to?"     Son had possible work exposure 2. PLACE of CONTACT: "Where were you when you were exposed to COVID-19?" (e.g., home, school, medical waiting room; which city?)     Home- if any 3. TYPE of CONTACT: "How much contact was there?" (e.g., sitting next to, live in same house, work in same office, same building)     Home- if any- awaiting son's results 4. DURATION of CONTACT: "How long were you in contact with the COVID-19 patient?" (e.g., a few seconds, passed by person, a few minutes, live with the patient)     Lives in home 5. DATE of CONTACT: "When did you have contact with a COVID-19 patient?" (e.g., how many days ago)     daily 6. TRAVEL: "Have you traveled out of the country recently?" If so, "When and where?"     * Also ask about out-of-state travel, since the CDC has identified some high risk cities for community spread in the [place].     * Note: Travel becomes less relevant if there is widespread community transmission where the patient lives.     no 7. COMMUNITY SPREAD: "Are there lots of cases of COVID-19 (community spread) where you live?" (See public health department website, if unsure)   * MAJOR community spread: high number of cases; numbers of cases are increasing; many people hospitalized.   * MINOR community spread: low number of cases; not increasing; few or no people hospitalized     minor 8. SYMPTOMS: "Do you have any symptoms?" (e.g., fever, cough, breathing difficulty)     no 9. PREGNANCY OR POSTPARTUM: "Is there any chance you are  pregnant?" "When was your last menstrual period?" "Did you deliver in the last 2 weeks?"     n/a 10. HIGH RISK: "Do you have any heart or lung problems? Do you have a weak immune system?" (e.g., CHF, COPD, asthma, HIV positive, chemotherapy, renal failure, diabetes mellitus, sickle cell anemia)       age  Protocols used: CORONAVIRUS (COVID-19) EXPOSURE-A-AH

## 2018-11-24 NOTE — Telephone Encounter (Signed)
Patient informed that she should self quarantine and gave the number for health department to call if wanting to talk about being tested

## 2018-12-06 DIAGNOSIS — N952 Postmenopausal atrophic vaginitis: Secondary | ICD-10-CM | POA: Diagnosis not present

## 2019-02-06 ENCOUNTER — Encounter: Payer: Self-pay | Admitting: Internal Medicine

## 2019-02-06 ENCOUNTER — Ambulatory Visit (INDEPENDENT_AMBULATORY_CARE_PROVIDER_SITE_OTHER): Payer: Medicare HMO | Admitting: Internal Medicine

## 2019-02-06 ENCOUNTER — Other Ambulatory Visit: Payer: Self-pay

## 2019-02-06 DIAGNOSIS — Z20822 Contact with and (suspected) exposure to covid-19: Secondary | ICD-10-CM | POA: Insufficient documentation

## 2019-02-06 DIAGNOSIS — R05 Cough: Secondary | ICD-10-CM

## 2019-02-06 DIAGNOSIS — R070 Pain in throat: Secondary | ICD-10-CM

## 2019-02-06 DIAGNOSIS — Z20828 Contact with and (suspected) exposure to other viral communicable diseases: Secondary | ICD-10-CM

## 2019-02-06 NOTE — Progress Notes (Signed)
Virtual Visit via Audio Note  I connected with [name] on 02/06/19 at 10:40 AM EDT by an audio-only enabled telemedicine application and verified that I am speaking with the correct person using two identifiers.  The patient and the provider were at separate locations throughout the entire encounter.   I discussed the limitations of evaluation and management by telemedicine and the availability of in person appointments. The patient expressed understanding and agreed to proceed.  History of Present Illness: The patient is a 73 y.o. female with visit for scratchy throat/sore throat. Started last week. Comes and goes. Tried sinus medicine which did not help. Is also having sinus drainage. Has daughter negative for covid-19 but positive for antibodies. Denies fevers or chills. Denies bad cough but some coughing. Denies SOB. Overall it is stable since onset. Has tried sinus medicine and teas without much benefit. Taking flonase which is also not helping.  Observations/Objective: Voice strong, no coughing or dyspnea during the visit.  Assessment and Plan: See problem oriented charting  Follow Up Instructions: covid-19 testing  Visit time 12 minutes: that time was spent in direct counseling and coordination of care with the patient: counseled about need to quarantine after covid-19 testing until results return as well as expected course and treatment options.   I discussed the assessment and treatment plan with the patient. The patient was provided an opportunity to ask questions and all were answered. The patient agreed with the plan and demonstrated an understanding of the instructions.   The patient was advised to call back or seek an in-person evaluation if the symptoms worsen or if the condition fails to improve as anticipated.  Hoyt Koch, MD

## 2019-02-06 NOTE — Assessment & Plan Note (Signed)
Checking Covid-19 testing and advised about quarantine needed until results return as well as expected course and timeline.

## 2019-02-07 LAB — NOVEL CORONAVIRUS, NAA: SARS-CoV-2, NAA: NOT DETECTED

## 2019-02-08 ENCOUNTER — Telehealth: Payer: Self-pay | Admitting: Internal Medicine

## 2019-02-08 NOTE — Telephone Encounter (Signed)
Generally sinus infections do not require antibiotics until more than 2 weeks of symptoms. Hers has only been going on 1 week per our last week.

## 2019-02-08 NOTE — Telephone Encounter (Signed)
Relation to pt: self  Call back number: Pharmacy: [id number] - [place], [place] [address] [phone] (Phone) [phone] (Fax)     Reason for call:  Patient experiencing sore throat / sinus infection / cough requesting antibiotics, patient last seen by PCP 02/06/2019, patient informed COVID results are negative, please advise regarding Rx

## 2019-02-08 NOTE — Telephone Encounter (Signed)
Patient informed of MD response and stated understanding  

## 2019-02-22 ENCOUNTER — Ambulatory Visit (INDEPENDENT_AMBULATORY_CARE_PROVIDER_SITE_OTHER): Payer: Medicare HMO | Admitting: Family Medicine

## 2019-02-22 ENCOUNTER — Ambulatory Visit: Payer: Self-pay

## 2019-02-22 ENCOUNTER — Other Ambulatory Visit: Payer: Self-pay

## 2019-02-22 ENCOUNTER — Encounter: Payer: Self-pay | Admitting: Family Medicine

## 2019-02-22 VITALS — BP 110/70 | HR 86 | Ht 60.0 in | Wt 142.0 lb

## 2019-02-22 DIAGNOSIS — M25512 Pain in left shoulder: Secondary | ICD-10-CM

## 2019-02-22 DIAGNOSIS — J320 Chronic maxillary sinusitis: Secondary | ICD-10-CM | POA: Diagnosis not present

## 2019-02-22 DIAGNOSIS — G8929 Other chronic pain: Secondary | ICD-10-CM | POA: Diagnosis not present

## 2019-02-22 DIAGNOSIS — M12812 Other specific arthropathies, not elsewhere classified, left shoulder: Secondary | ICD-10-CM

## 2019-02-22 DIAGNOSIS — J329 Chronic sinusitis, unspecified: Secondary | ICD-10-CM | POA: Insufficient documentation

## 2019-02-22 MED ORDER — AMOXICILLIN-POT CLAVULANATE 875-125 MG PO TABS: 1.0000 | ORAL_TABLET | Freq: Two times a day (BID) | ORAL | 0 refills | Status: AC

## 2019-02-22 NOTE — Assessment & Plan Note (Signed)
Augmentin given.  Discussed posture and ergonomics, discussed which activities to do which was to avoid.  Follow-up again in 4 to 8 weeks

## 2019-02-22 NOTE — Assessment & Plan Note (Signed)
patient given another injection today.  Tolerated the procedure well.  Discussed icing regimen and home exercises.  We discussed that this will be continued.  There is no problem from time to time.  Patient will follow-up with me in 4 to 8 weeks

## 2019-02-22 NOTE — Progress Notes (Signed)
[name] [place], [place] [address] Phone: [phone] Subjective:   I [name] am serving as a Education administrator for Dr. Hulan Saas.  CC: Left shoulder pain  [id number]   04/20/2017 Discussed with patient at great length. Patient is doing very well. No change in management at this time. Continue the home exercises icing regimen. Follow-up with me again in 6-8 weeks.  02/22/2019 [name] is a 73 y.o. female coming in with complaint of left shoulder pain. States it is the same issue with before. Limited ROM.   Onset- chronic  Patient has some left shoulder pain.  Was seen previously had more of a rotator cuff arthropathy, has responded well initially greater than 2 years ago was given an injection.  Over the course the last several weeks having increasing discomfort and pain.  Patient is left-handed is having difficulty even doing some daily activities as well as waking up at night.     Past Medical History:  Diagnosis Date  . Arthritis    KNEES  . CTS (carpal tunnel syndrome)    BILATERAL LEFT > RIGHT (OCC. HAND NUMBNESS)  . Female cystocele   . GERD (gastroesophageal reflux disease)   . H/O hiatal hernia   . History of cervical dysplasia    CIN II  . History of peritonitis    2006--  S/P PERFORATED BOWEL INJURY DURING SURGERY  . Hyperlipidemia    Past Surgical History:  Procedure Laterality Date  . ABDOMINAL HYSTERECTOMY  1991   W/ BILATERAL SALPINGOOPHORECTOMY  . BREAST BIOPSY    . LAPAROSCOPIC CHOLECYSTECTOMY  2006   W/ PERFERATED BOWEL (INJURY)  . NASAL SEPTUM SURGERY  70's   REPAIR DEVIATED SEPTUM  . TUBAL LIGATION  1980'S  . VAGINAL PROLAPSE REPAIR N/A 08/15/2012   Procedure: VAGINAL VAULT SUSPENSION;  Surgeon: Ailene Rud, MD;  Location: [facility];  Service: Urology;  Laterality: N/A;  ANTERIOR VAULT REPAIR WITH XENFORE   Social History   Socioeconomic History  . Marital status:  Married    Spouse name: Not on file  . Number of children: Not on file  . Years of education: Not on file  . Highest education level: Not on file  Occupational History  . Not on file  Social Needs  . Financial resource strain: Not on file  . Food insecurity    Worry: Not on file    Inability: Not on file  . Transportation needs    Medical: Not on file    Non-medical: Not on file  Tobacco Use  . Smoking status: Never Smoker  . Smokeless tobacco: Never Used  Substance and Sexual Activity  . Alcohol use: No    Alcohol/week: 0.0 standard drinks    Comment: no  . Drug use: No  . Sexual activity: Not Currently    Birth control/protection: Surgical  Lifestyle  . Physical activity    Days per week: Not on file    Minutes per session: Not on file  . Stress: Not on file  Relationships  . Social Herbalist on phone: Not on file    Gets together: Not on file    Attends religious service: Not on file    Active member of club or organization: Not on file    Attends meetings of clubs or organizations: Not on file    Relationship status: Not on file  Other Topics Concern  . Not on file  Social History Narrative   Married '68. 2 son - '69, '70; 1 son - '71: 2 grandchildren. 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   Allergies  Allergen Reactions  . Zocor [Simvastatin]     12/2013 CK 528  . Advil [Ibuprofen] Swelling    Tongue swells  . Bran [Wheat Bran] Other (See Comments)    Very bad GI pain  . Hydrocodone Nausea Only and Other (See Comments)     TACHYCARDIA  . Hydrocodone-Acetaminophen Nausea Only    Other reaction(s): Increased Heart Rate (intolerance)  . Lipitor [Atorvastatin] Other (See Comments)    Bad dreams  . Pravachol [Pravastatin Sodium] Other (See Comments)    Bad dreams   Family History  Problem Relation Age of Onset  . Coronary artery disease Mother   . Other Mother        cva  . Hypertension Mother   . Heart disease Mother   . Other  Father        CVA  . Stroke Father   . Cancer Sister 26       breast  . Breast cancer Sister   . Cancer Brother        prancreatic  . Cancer Sister 34       breast  . Breast cancer Sister   . Diabetes Other   . Coronary artery disease Other   . Hypertension Other   . Stroke Other   . Cancer Other        breast  . Colon cancer Neg Hx      Current Outpatient Medications (Cardiovascular):  .  hydrochlorothiazide (MICROZIDE) 12.5 MG capsule, Take 1 capsule (12.5 mg total) by mouth daily. Need office visit for further refills .  rosuvastatin (CRESTOR) 20 MG tablet, Take 1 tablet (20 mg total) by mouth daily.  Current Outpatient Medications (Respiratory):  .  fluticasone (FLONASE) 50 MCG/ACT nasal spray, Place 2 sprays into both nostrils daily.  Current Outpatient Medications (Analgesics):  .  acetaminophen (TYLENOL) 500 MG tablet, Take 500-750 mg by mouth every 6 (six) hours as needed for pain.   Current Outpatient Medications (Other):  .  esomeprazole (NEXIUM) 40 MG capsule, Take 1 capsule (40 mg total) by mouth 2 (two) times daily before a meal. OTC .  estradiol (ESTRACE) 0.1 MG/GM vaginal cream, Place 2 g vaginally 3 (three) times a week. .  famotidine (PEPCID) 20 MG tablet, Take 1 tablet (20 mg total) by mouth at bedtime. .  meclizine (ANTIVERT) 12.5 MG tablet, Take 1 tablet (12.5 mg total) by mouth 3 (three) times daily as needed for dizziness. .  Multiple Vitamin (MULTIVITAMIN) tablet, Take 1 tablet by mouth every other day.  .  Omega-3 Fatty Acids (FISH OIL PO), Take by mouth. Marland Kitchen  OVER THE COUNTER MEDICATION, Place 1 suppository rectally daily as needed (for hemmorhoids). Marland Kitchen  tetrahydrozoline (VISINE) 0.05 % ophthalmic solution, Place 2 drops into both eyes daily as needed (for dry eyes). Marland Kitchen  amoxicillin-clavulanate (AUGMENTIN) 875-125 MG tablet, Take 1 tablet by mouth 2 (two) times daily for 10 days.    Past medical history, social, surgical and family history all  reviewed in electronic medical record.  No pertanent information unless stated regarding to the chief complaint.   Review of Systems:  No headache, visual changes, nausea, vomiting, diarrhea, constipation, dizziness, abdominal pain, skin rash, fevers, chills, night sweats, weight loss, swollen lymph nodes, body aches, joint swelling,chest pain, shortness of breath, mood changes.  Positive muscle aches  Objective  Blood pressure 110/70, pulse 86, height 5' (1.524 m), weight 142 lb (64.4 kg), SpO2 98 %.    General: No apparent distress alert and oriented x3 mood and affect normal, dressed appropriately.  HEENT: Pupils equal, extraocular movements intact frontal sinuses severely tender to palpation and percussion Respiratory: Patient's speak in full sentences and does not appear short of breath  Cardiovascular: No lower extremity edema, non tender, no erythema  Skin: Warm dry intact with no signs of infection or rash on extremities or on axial skeleton.  Abdomen: Soft nontender  Neuro: Cranial nerves II through XII are intact, neurovascularly intact in all extremities with 2+ DTRs and 2+ pulses.  Lymph: No lymphadenopathy of posterior or anterior cervical chain or axillae bilaterally.  Gait antalgic.  MSK:  tender with mild limited range of motion and good stability and symmetric strength and tone of , elbows, wrist, hip, knee and ankles bilaterally.  Arthritic changes of multiple joints Left shoulder exam shows the patient does have some atrophy of the musculature of the shoulder girdle crepitus noted.  The patient has rotator cuff strength 3+ out of 5 compared to 4+ out of 5 on the contralateral side.  Patient has a positive drop arm and painful arc.  Procedure: Real-time Ultrasound Guided Injection of left glenohumeral joint Device: GE Logiq E  Ultrasound guided injection is preferred based studies that show increased duration, increased effect, greater accuracy, decreased procedural pain,  increased response rate with ultrasound guided versus blind injection.  Verbal informed consent obtained.  Time-out conducted.  Noted no overlying erythema, induration, or other signs of local infection.  Skin prepped in a sterile fashion.  Local anesthesia: Topical Ethyl chloride.  With sterile technique and under real time ultrasound guidance:  Joint visualized.  21g 2 inch needle inserted posterior approach. Pictures taken for needle placement. Patient did have injection of 2 cc of 0.5% Marcaine, and 1cc of Kenalog 40 mg/dL. Completed without difficulty  Pain immediately resolved suggesting accurate placement of the medication.  Advised to call if fevers/chills, erythema, induration, drainage, or persistent bleeding.  Images permanently stored and available for review in the ultrasound unit.  Impression: Technically successful ultrasound guided injection.   Impression and Recommendations:     This case required medical decision making of moderate complexity. The above documentation has been reviewed and is accurate and complete Kimberly Pulley, DO       Note: This dictation was prepared with Dragon dictation along with smaller phrase technology. Any transcriptional errors that result from this process are unintentional.

## 2019-02-22 NOTE — Patient Instructions (Addendum)
Good to see you Augmentin 2 times a day for 10 days Voltaren gel over the counter 2 times daily Continue exercises but keep hands in peripheral vision See me again in 6 weeks if better we will change to virtual

## 2019-03-01 ENCOUNTER — Ambulatory Visit: Payer: Medicare HMO | Admitting: Family Medicine

## 2019-03-30 ENCOUNTER — Other Ambulatory Visit: Payer: Self-pay

## 2019-03-30 ENCOUNTER — Ambulatory Visit (INDEPENDENT_AMBULATORY_CARE_PROVIDER_SITE_OTHER): Payer: Medicare HMO | Admitting: Internal Medicine

## 2019-03-30 ENCOUNTER — Other Ambulatory Visit (INDEPENDENT_AMBULATORY_CARE_PROVIDER_SITE_OTHER): Payer: Medicare HMO

## 2019-03-30 ENCOUNTER — Encounter: Payer: Self-pay | Admitting: Internal Medicine

## 2019-03-30 VITALS — BP 120/82 | HR 84 | Temp 98.7°F | Ht 60.0 in | Wt 142.0 lb

## 2019-03-30 DIAGNOSIS — M25551 Pain in right hip: Secondary | ICD-10-CM | POA: Diagnosis not present

## 2019-03-30 DIAGNOSIS — Z23 Encounter for immunization: Secondary | ICD-10-CM

## 2019-03-30 DIAGNOSIS — I10 Essential (primary) hypertension: Secondary | ICD-10-CM

## 2019-03-30 DIAGNOSIS — K6289 Other specified diseases of anus and rectum: Secondary | ICD-10-CM | POA: Diagnosis not present

## 2019-03-30 LAB — COMPREHENSIVE METABOLIC PANEL
ALT: 23 U/L (ref 0–35)
AST: 22 U/L (ref 0–37)
Albumin: 4.3 g/dL (ref 3.5–5.2)
Alkaline Phosphatase: 55 U/L (ref 39–117)
BUN: 15 mg/dL (ref 6–23)
CO2: 31 mEq/L (ref 19–32)
Calcium: 10.1 mg/dL (ref 8.4–10.5)
Chloride: 102 mEq/L (ref 96–112)
Creatinine, Ser: 0.85 mg/dL (ref 0.40–1.20)
GFR: 79.22 mL/min (ref >60.00)
Glucose, Bld: 87 mg/dL (ref 70–99)
Potassium: 3.4 mEq/L — ABNORMAL LOW (ref 3.5–5.1)
Sodium: 141 mEq/L (ref 135–145)
Total Bilirubin: 0.9 mg/dL (ref 0.2–1.2)
Total Protein: 7.2 g/dL (ref 6.0–8.3)

## 2019-03-30 LAB — CBC
HCT: 40.8 % (ref 36.0–46.0)
Hemoglobin: 13.7 g/dL (ref 12.0–15.0)
MCHC: 33.5 g/dL (ref 30.0–36.0)
MCV: 91 fl (ref 78.0–100.0)
Platelets: 310 10*3/uL (ref 150.0–400.0)
RBC: 4.48 Mil/uL (ref 3.87–5.11)
RDW: 13.9 % (ref 11.5–15.5)
WBC: 6.2 10*3/uL (ref 4.0–10.5)

## 2019-03-30 MED ORDER — ESOMEPRAZOLE MAGNESIUM 40 MG PO CPDR: 40.0000 mg | DELAYED_RELEASE_CAPSULE | Freq: Two times a day (BID) | ORAL | 3 refills | Status: DC

## 2019-03-30 MED ORDER — ROSUVASTATIN CALCIUM 20 MG PO TABS: 20.0000 mg | ORAL_TABLET | ORAL | 3 refills | Status: DC

## 2019-03-30 NOTE — Patient Instructions (Signed)
We will check the blood work today.    Hip Bursitis Rehab Ask your health care provider which exercises are safe for you. Do exercises exactly as told by your health care provider and adjust them as directed. It is normal to feel mild stretching, pulling, tightness, or discomfort as you do these exercises. Stop right away if you feel sudden pain or your pain gets worse. Do not begin these exercises until told by your health care provider. Stretching exercise This exercise warms up your muscles and joints and improves the movement and flexibility of your hip. This exercise also helps to relieve pain and stiffness. Iliotibial band stretch An iliotibial band is a strong band of muscle tissue that runs from the outer side of your hip to the outer side of your thigh and knee. 1. Lie on your side with your left / right leg in the top position. 2. Bend your left / right knee and grab your ankle. Stretch out your bottom arm to help you balance. 3. Slowly bring your knee back so your thigh is behind your body. 4. Slowly lower your knee toward the floor until you feel a gentle stretch on the outside of your left / right thigh. If you do not feel a stretch and your knee will not fall farther, place the heel of your other foot on top of your knee and pull your knee down toward the floor with your foot. 5. Hold this position for __________ seconds. 6. Slowly return to the starting position. Repeat __________ times. Complete this exercise __________ times a day. Strengthening exercises These exercises build strength and endurance in your hip and pelvis. Endurance is the ability to use your muscles for a long time, even after they get tired. Bridge This exercise strengthens the muscles that move your thigh backward (hip extensors). 1. Lie on your back on a firm surface with your knees bent and your feet flat on the floor. 2. Tighten your buttocks muscles and lift your buttocks off the floor until your trunk is  level with your thighs. ? Do not arch your back. ? You should feel the muscles working in your buttocks and the back of your thighs. If you do not feel these muscles, slide your feet 1-2 inches (2.5-5 cm) farther away from your buttocks. ? If this exercise is too easy, try doing it with your arms crossed over your chest. 3. Hold this position for __________ seconds. 4. Slowly lower your hips to the starting position. 5. Let your muscles relax completely after each repetition. Repeat __________ times. Complete this exercise __________ times a day. Squats This exercise strengthens the muscles in front of your thigh and knee (quadriceps). 1. Stand in front of a table, with your feet and knees pointing straight ahead. You may rest your hands on the table for balance but not for support. 2. Slowly bend your knees and lower your hips like you are going to sit in a chair. ? Keep your weight over your heels, not over your toes. ? Keep your lower legs upright so they are parallel with the table legs. ? Do not let your hips go lower than your knees. ? Do not bend lower than told by your health care provider. ? If your hip pain increases, do not bend as low. 3. Hold the squat position for __________ seconds. 4. Slowly push with your legs to return to standing. Do not use your hands to pull yourself to standing. Repeat __________ times. Complete  this exercise __________ times a day. Hip hike 1. Stand sideways on a bottom step. Stand on your left / right leg with your other foot unsupported next to the step. You can hold on to the railing or wall for balance if needed. 2. Keep your knees straight and your torso square. Then lift your left / right hip up toward the ceiling. 3. Hold this position for __________ seconds. 4. Slowly let your left / right hip lower toward the floor, past the starting position. Your foot should get closer to the floor. Do not lean or bend your knees. Repeat __________ times.  Complete this exercise __________ times a day. Single leg stand 1. Without shoes, stand near a railing or in a doorway. You may hold on to the railing or door frame as needed for balance. 2. Squeeze your left / right buttock muscles, then lift up your other foot. ? Do not let your left / right hip push out to the side. ? It is helpful to stand in front of a mirror for this exercise so you can watch your hip. 3. Hold this position for __________ seconds. Repeat __________ times. Complete this exercise __________ times a day. This information is not intended to replace advice given to you by your health care provider. Make sure you discuss any questions you have with your health care provider. Document Released: 07/23/2004 Document Revised: 10/10/2018 Document Reviewed: 10/10/2018 Elsevier Patient Education  2020 [employer].

## 2019-03-30 NOTE — Assessment & Plan Note (Signed)
Suspect bursitis and is overall improving. Will use otc creams and given stretching exercises to help.

## 2019-03-30 NOTE — Assessment & Plan Note (Signed)
Could be related to hemorrhoids, last colonoscopy 2013 without changes. Checking CMP and CBC. Since improving she declines digital exam today and will if not improving or gone.

## 2019-03-30 NOTE — Progress Notes (Signed)
   Subjective:   Patient ID: [name], female    DOB: 1946/04/03, 73 y.o.   MRN: [id number]  HPI The patient is a 73 YO female coming in for concerns about pain in rectum area (started about 3 weeks ago, denies constipation but some diarrhea at times, overall is improving gradually, happens randomly and a couple times while having BM, sometimes just lying around, lasts 5-10 minutes, pain sharp to start, prior bladder tuck years ago, prior hysterectomy, denies abdominal pain or bloating, denies fevers or chills, does have hemorrhoids and sometimes they do protrude) and pain in the right hip region (on the side of the hip, started about 2 weeks ago, overall improving, has been using some cream otc on this, worse in the morning and gradually improves over the day, denies pain in the groin, denies numbness or tingling down back, denies pain in low back, denies overuse or injury at onset).   Review of Systems  Constitutional: Negative.   HENT: Negative.   Eyes: Negative.   Respiratory: Negative for cough, chest tightness and shortness of breath.   Cardiovascular: Negative for chest pain, palpitations and leg swelling.  Gastrointestinal: Positive for rectal pain. Negative for abdominal distention, abdominal pain, constipation, diarrhea, nausea and vomiting.  Musculoskeletal: Positive for arthralgias and myalgias. Negative for back pain, gait problem, joint swelling and neck pain.  Skin: Negative.   Neurological: Negative.   Psychiatric/Behavioral: Negative.     Objective:  Physical Exam Constitutional:      Appearance: She is well-developed.  HENT:     Head: Normocephalic and atraumatic.  Neck:     Musculoskeletal: Normal range of motion.  Cardiovascular:     Rate and Rhythm: Normal rate and regular rhythm.  Pulmonary:     Effort: Pulmonary effort is normal. No respiratory distress.     Breath sounds: Normal breath sounds. No wheezing or rales.  Abdominal:     General: Bowel sounds  are normal. There is no distension.     Palpations: Abdomen is soft.     Tenderness: There is no abdominal tenderness. There is no rebound.  Musculoskeletal:        General: No tenderness.  Skin:    General: Skin is warm and dry.  Neurological:     Mental Status: She is alert and oriented to person, place, and time.     Coordination: Coordination normal.     Vitals:   03/30/19 1032  BP: 120/82  Pulse: 84  Temp: 98.7 F (37.1 C)  TempSrc: Oral  SpO2: 96%  Weight: 142 lb (64.4 kg)  Height: 5' (1.524 m)    Assessment & Plan:  Flu shot given

## 2019-04-03 NOTE — Progress Notes (Addendum)
[name] [place], [place] [address] Phone: [phone] Subjective:   Fontaine No, am serving as a scribe for Dr. Hulan Saas.   CC: L shoulder  [id number]    02/22/19: patient given another injection today.  Tolerated the procedure well.  Discussed icing regimen and home exercises.  We discussed that this will be continued.  There is no problem from time to time.  Patient will follow-up with me in 4 to 8 weeks  Update- 04/04/19: [name] is a 72 y.o. female coming in with complaint of left shoulder pain. Only had 10 days of relief from her pain. Pain continues in anterior aspect of shoulder. Pain with putting on clothing when arm is in flexion. Is using Voltaren gel.   Also having pain in lateral aspect of right hip. Pain increases with walking. Was told by her PCP that she has bursitis.      Past Medical History:  Diagnosis Date  . Arthritis    KNEES  . CTS (carpal tunnel syndrome)    BILATERAL LEFT > RIGHT (OCC. HAND NUMBNESS)  . Female cystocele   . GERD (gastroesophageal reflux disease)   . H/O hiatal hernia   . History of cervical dysplasia    CIN II  . History of peritonitis    2006--  S/P PERFORATED BOWEL INJURY DURING SURGERY  . Hyperlipidemia    Past Surgical History:  Procedure Laterality Date  . ABDOMINAL HYSTERECTOMY  1991   W/ BILATERAL SALPINGOOPHORECTOMY  . BREAST BIOPSY    . LAPAROSCOPIC CHOLECYSTECTOMY  2006   W/ PERFERATED BOWEL (INJURY)  . NASAL SEPTUM SURGERY  70's   REPAIR DEVIATED SEPTUM  . TUBAL LIGATION  1980'S  . VAGINAL PROLAPSE REPAIR N/A 08/15/2012   Procedure: VAGINAL VAULT SUSPENSION;  Surgeon: Ailene Rud, MD;  Location: [facility];  Service: Urology;  Laterality: N/A;  ANTERIOR VAULT REPAIR WITH XENFORE   Social History   Socioeconomic History  . Marital status: Married    Spouse name: Not on file  . Number of children: Not on file  . Years of  education: Not on file  . Highest education level: Not on file  Occupational History  . Not on file  Social Needs  . Financial resource strain: Not on file  . Food insecurity    Worry: Not on file    Inability: Not on file  . Transportation needs    Medical: Not on file    Non-medical: Not on file  Tobacco Use  . Smoking status: Never Smoker  . Smokeless tobacco: Never Used  Substance and Sexual Activity  . Alcohol use: No    Alcohol/week: 0.0 standard drinks    Comment: no  . Drug use: No  . Sexual activity: Not Currently    Birth control/protection: Surgical  Lifestyle  . Physical activity    Days per week: Not on file    Minutes per session: Not on file  . Stress: Not on file  Relationships  . Social Herbalist on phone: Not on file    Gets together: Not on file    Attends religious service: Not on file    Active member of club or organization: Not on file    Attends meetings of clubs or organizations: Not on file    Relationship status: Not on file  Other Topics Concern  . Not on file  Social History Narrative  Married '68. 2 son - '69, '70; 1 son - '71: 2 grandchildren. 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   Allergies  Allergen Reactions  . Zocor [Simvastatin]     12/2013 CK 528  . Advil [Ibuprofen] Swelling    Tongue swells  . Bran [Wheat Bran] Other (See Comments)    Very bad GI pain  . Hydrocodone Nausea Only and Other (See Comments)     TACHYCARDIA  . Hydrocodone-Acetaminophen Nausea Only    Other reaction(s): Increased Heart Rate (intolerance)  . Lipitor [Atorvastatin] Other (See Comments)    Bad dreams  . Pravachol [Pravastatin Sodium] Other (See Comments)    Bad dreams   Family History  Problem Relation Age of Onset  . Coronary artery disease Mother   . Other Mother        cva  . Hypertension Mother   . Heart disease Mother   . Other Father        CVA  . Stroke Father   . Cancer Sister 54       breast  . Breast  cancer Sister   . Cancer Brother        prancreatic  . Cancer Sister 52       breast  . Breast cancer Sister   . Diabetes Other   . Coronary artery disease Other   . Hypertension Other   . Stroke Other   . Cancer Other        breast  . Colon cancer Neg Hx      Current Outpatient Medications (Cardiovascular):  .  hydrochlorothiazide (MICROZIDE) 12.5 MG capsule, Take 1 capsule (12.5 mg total) by mouth daily. Need office visit for further refills .  rosuvastatin (CRESTOR) 20 MG tablet, Take 1 tablet (20 mg total) by mouth 3 (three) times a week.  Current Outpatient Medications (Respiratory):  .  fluticasone (FLONASE) 50 MCG/ACT nasal spray, Place 2 sprays into both nostrils daily.  Current Outpatient Medications (Analgesics):  .  acetaminophen (TYLENOL) 500 MG tablet, Take 500-750 mg by mouth every 6 (six) hours as needed for pain.   Current Outpatient Medications (Other):  .  esomeprazole (NEXIUM) 40 MG capsule, Take 1 capsule (40 mg total) by mouth 2 (two) times daily before a meal. .  estradiol (ESTRACE) 0.1 MG/GM vaginal cream, Place 2 g vaginally 3 (three) times a week. .  famotidine (PEPCID) 20 MG tablet, Take 1 tablet (20 mg total) by mouth at bedtime. .  meclizine (ANTIVERT) 12.5 MG tablet, Take 1 tablet (12.5 mg total) by mouth 3 (three) times daily as needed for dizziness. .  Multiple Vitamin (MULTIVITAMIN) tablet, Take 1 tablet by mouth every other day.  .  Omega-3 Fatty Acids (FISH OIL PO), Take by mouth. Marland Kitchen  OVER THE COUNTER MEDICATION, Place 1 suppository rectally daily as needed (for hemmorhoids). Marland Kitchen  tetrahydrozoline (VISINE) 0.05 % ophthalmic solution, Place 2 drops into both eyes daily as needed (for dry eyes).    Past medical history, social, surgical and family history all reviewed in electronic medical record.  No pertanent information unless stated regarding to the chief complaint.   Review of Systems:  No headache, visual changes, nausea, vomiting,  diarrhea, constipation, dizziness, abdominal pain, skin rash, fevers, chills, night sweats, weight loss, swollen lymph nodes, body aches, joint swelling, chest pain, shortness of breath, mood changes.  Positive muscle aches  Objective  Blood pressure 124/72, pulse 82, height 5' (1.524 m), weight 143 lb (64.9 kg), SpO2 97 %.  General: No apparent distress alert and oriented x3 mood and affect normal, dressed appropriately.  HEENT: Pupils equal, extraocular movements intact  Respiratory: Patient's speak in full sentences and does not appear short of breath  Cardiovascular: No lower extremity edema, non tender, no erythema  Skin: Warm dry intact with no signs of infection or rash on extremities or on axial skeleton.  Abdomen: Soft nontender  Neuro: Cranial nerves II through XII are intact, neurovascularly intact in all extremities with 2+ DTRs and 2+ pulses.  Lymph: No lymphadenopathy of posterior or anterior cervical chain or axillae bilaterally.  Gait antalgic.  MSK:  tender with full range of motion and good stability and symmetric strength and tone of  elbows, wrist, , knee and ankles bilaterally.  Left shoulder exam does show the patient does have some mild crepitus.  Patient does have weakness in 4-5 strength of the rotator cuff, decreased range of motion in all planes and has some mild atrophy of the musculature of the shoulder noted. Positive crossover as well.  Contralateral shoulder mild impingement  Right hip pain with severe tenderness over the greater trochanteric area.  Positive Faber with pain on the lateral aspect of the hip.  Minimal pain over the sacroiliac joint.  Scoliosis of the lumbar spine noted.  Tightness of the hamstrings but no radicular symptoms noted with straight leg test.   Procedure: Real-time Ultrasound Guided Injection of left acromioclavicular joint Device: GE Logiq Q7 Ultrasound guided injection is preferred based studies that show increased duration,  increased effect, greater accuracy, decreased procedural pain, increased response rate, and decreased cost with ultrasound guided versus blind injection.  Verbal informed consent obtained.  Time-out conducted.  Noted no overlying erythema, induration, or other signs of local infection.  Skin prepped in a sterile fashion.  Local anesthesia: Topical Ethyl chloride.  With sterile technique and under real time ultrasound guidance: With a 25-gauge half inch needle injected with 0.5 cc of 0.5% Marcaine and 0.5 cc of Kenalog 40 mg/mL Completed without difficulty  Pain immediately resolved suggesting accurate placement of the medication.  Advised to call if fevers/chills, erythema, induration, drainage, or persistent bleeding.  Images permanently stored and available for review in the ultrasound unit.  Impression: Technically successful ultrasound guided injection.    Procedure: Real-time Ultrasound Guided Injection of right greater trochanteric bursitis secondary to patient's body habitus Device: GE Logiq Q7 Ultrasound guided injection is preferred based studies that show increased duration, increased effect, greater accuracy, decreased procedural pain, increased response rate, and decreased cost with ultrasound guided versus blind injection.  Verbal informed consent obtained.  Time-out conducted.  Noted no overlying erythema, induration, or other signs of local infection.  Skin prepped in a sterile fashion.  Local anesthesia: Topical Ethyl chloride.  With sterile technique and under real time ultrasound guidance:  Greater trochanteric area was visualized and patient's bursa was noted. A 22-gauge 3 inch needle was inserted and 4 cc of 0.5% Marcaine and 1 cc of Kenalog 40 mg/dL was injected. Pictures taken Completed without difficulty  Pain immediately resolved suggesting accurate placement of the medication.  Advised to call if fevers/chills, erythema, induration, drainage, or persistent bleeding.   Images permanently stored and available for review in the ultrasound unit.  Impression: Technically successful ultrasound guided injection.    Impression and Recommendations:     This case required medical decision making of moderate complexity. The above documentation has been reviewed and is accurate and complete Lyndal Pulley, DO  Note: This dictation was prepared with Dragon dictation along with smaller phrase technology. Any transcriptional errors that result from this process are unintentional.

## 2019-04-04 ENCOUNTER — Ambulatory Visit: Payer: Self-pay

## 2019-04-04 ENCOUNTER — Encounter: Payer: Self-pay | Admitting: Family Medicine

## 2019-04-04 ENCOUNTER — Ambulatory Visit (INDEPENDENT_AMBULATORY_CARE_PROVIDER_SITE_OTHER): Payer: Medicare HMO | Admitting: Family Medicine

## 2019-04-04 ENCOUNTER — Other Ambulatory Visit: Payer: Self-pay

## 2019-04-04 VITALS — BP 124/72 | HR 82 | Ht 60.0 in | Wt 143.0 lb

## 2019-04-04 DIAGNOSIS — M25512 Pain in left shoulder: Secondary | ICD-10-CM | POA: Diagnosis not present

## 2019-04-04 DIAGNOSIS — G8929 Other chronic pain: Secondary | ICD-10-CM

## 2019-04-04 DIAGNOSIS — M25551 Pain in right hip: Secondary | ICD-10-CM | POA: Diagnosis not present

## 2019-04-04 DIAGNOSIS — M19012 Primary osteoarthritis, left shoulder: Secondary | ICD-10-CM

## 2019-04-04 DIAGNOSIS — M12812 Other specific arthropathies, not elsewhere classified, left shoulder: Secondary | ICD-10-CM | POA: Diagnosis not present

## 2019-04-04 NOTE — Assessment & Plan Note (Signed)
Patient given injection today, tolerated the procedure well.  Discussed icing regimen and home exercises, increase activity slowly.  Follow-up again in 4 to 8 weeks.  Likely the underlying rotator cuff arthropathy is contributing.

## 2019-04-04 NOTE — Assessment & Plan Note (Addendum)
Not as much improvement.  Discussed which activities to do which wants to avoid.  Increase activity as tolerated.  Will likely need injections intermittently.  Last injection was 10 weeks ago.  Hopefully patient will respond better to this.  Could be a candidate for PRP.  Patient wants to avoid any surgical intervention.  Follow-up 10 weeks

## 2019-04-04 NOTE — Patient Instructions (Addendum)
Injected hip and AC joint today Voltaren gel  Exercises 3x a week See me again in 6 weeks

## 2019-04-04 NOTE — Assessment & Plan Note (Signed)
GT injection given today.  Tolerated the procedure well.  We discussed the muscle imbalances.  History of scoliosis that could be contributing.  Discussed posture and ergonomics, patient will increase activity.  Follow-up again in 4 to 8 weeks

## 2019-04-07 ENCOUNTER — Telehealth: Payer: Self-pay | Admitting: Internal Medicine

## 2019-04-07 NOTE — Telephone Encounter (Signed)
Patient calling and states that the pharmacy is needing a prescription for the esomeprazole (NEXIUM) 40 MG capsule States that it needs to be 1 capsule 1x a day. Please advise.

## 2019-04-10 MED ORDER — ESOMEPRAZOLE MAGNESIUM 40 MG PO CPDR: 40.0000 mg | DELAYED_RELEASE_CAPSULE | Freq: Every day | ORAL | 3 refills | Status: DC

## 2019-04-10 NOTE — Telephone Encounter (Signed)
Sent!

## 2019-04-10 NOTE — Telephone Encounter (Signed)
Okay to change #90 3 refills

## 2019-04-10 NOTE — Telephone Encounter (Signed)
Okay to change from 2 times daily to 1 a day?

## 2019-04-20 ENCOUNTER — Ambulatory Visit: Payer: Self-pay | Admitting: *Deleted

## 2019-04-20 NOTE — Telephone Encounter (Signed)
FYI, unable to reach patient.

## 2019-04-20 NOTE — Telephone Encounter (Addendum)
Summary: Ear pain/balance   Pt stated she has been having inner ear pain as well as being off balance. She would like to talk with NT about these symptoms.      Attempted to call patient back 3 times. No answer. Left message to call the office back to discuss her symptoms. And if she finds the symptoms are to the point she cant walk to go to the hospital. Routing to [facility] at [facility] for review.

## 2019-04-20 NOTE — Telephone Encounter (Signed)
Patient is complaining of dizziness with ear "roaoring", leg unstable at times and comes and goes. At first patient thought it was new medication- Nexium- but she stopped it Tuesday and she is still feeling dizziness. Patient vitals are good- T 97.6, BP 127/79, and P 81.  Reason for Disposition . [1] MODERATE dizziness (e.g., vertigo; feels very unsteady, interferes with normal activities) AND [2] has been evaluated by physician for this  Answer Assessment - Initial Assessment Questions 1. DESCRIPTION: "Describe your dizziness."     Off balance some 2. VERTIGO: "Do you feel like either you or the room is spinning or tilting?"      no 3. LIGHTHEADED: "Do you feel lightheaded?" (e.g., somewhat faint, woozy, weak upon standing)     No- a little like that yesterday 4. SEVERITY: "How bad is it?"  "Can you walk?"   - MILD - Feels unsteady but walking normally.   - MODERATE - Feels very unsteady when walking, but not falling; interferes with normal activities (e.g., school, work) .   - SEVERE - Unable to walk without falling (requires assistance).     mild 5. ONSET:  "When did the dizziness begin?"     Started Saturday 6. AGGRAVATING FACTORS: "Does anything make it worse?" (e.g., standing, change in head position)     Feels mostly when standing 7. CAUSE: "What do you think is causing the dizziness?"     Patient thought it was new medication- stopped the medication on Tuesday- last dose, patient feels she may have inner ear problem 8. RECURRENT SYMPTOM: "Have you had dizziness before?" If so, ask: "When was the last time?" "What happened that time?"     Yes- hx vertigo 9. OTHER SYMPTOMS: "Do you have any other symptoms?" (e.g., headache, weakness, numbness, vomiting, earache)     Leg feels " light"- feels like she has had a tranquilizer- both legs, roaring in ears 10. PREGNANCY: "Is there any chance you are pregnant?" "When was your last menstrual period?"       n/a  Protocols used: DIZZINESS -  VERTIGO-A-AH

## 2019-04-20 NOTE — Telephone Encounter (Signed)
noted 

## 2019-04-21 ENCOUNTER — Ambulatory Visit (INDEPENDENT_AMBULATORY_CARE_PROVIDER_SITE_OTHER): Payer: Medicare HMO | Admitting: Internal Medicine

## 2019-04-21 ENCOUNTER — Other Ambulatory Visit: Payer: Self-pay

## 2019-04-21 ENCOUNTER — Encounter: Payer: Self-pay | Admitting: Internal Medicine

## 2019-04-21 ENCOUNTER — Other Ambulatory Visit: Payer: Self-pay | Admitting: Internal Medicine

## 2019-04-21 VITALS — BP 120/86 | HR 76 | Temp 98.8°F | Ht 60.0 in | Wt 143.0 lb

## 2019-04-21 DIAGNOSIS — R42 Dizziness and giddiness: Secondary | ICD-10-CM | POA: Insufficient documentation

## 2019-04-21 MED ORDER — MECLIZINE HCL 12.5 MG PO TABS: 12.5000 mg | ORAL_TABLET | Freq: Three times a day (TID) | ORAL | 0 refills | Status: DC | PRN

## 2019-04-21 NOTE — Telephone Encounter (Signed)
Requested medication (s) are due for refill today: yes  Requested medication (s) are on the active medication list: yes  Last refill:  11/08/2017  Future visit scheduled: yes  Notes to clinic: refill cannot be delegated   Requested Prescriptions  Pending Prescriptions Disp Refills   meclizine (ANTIVERT) 12.5 MG tablet 30 tablet 0    Sig: Take 1 tablet (12.5 mg total) by mouth 3 (three) times daily as needed for dizziness.     Not Delegated - Gastroenterology: Antiemetics Failed - 04/21/2019 10:28 AM      Failed - This refill cannot be delegated      Passed - Valid encounter within last 6 months    Recent Outpatient Visits          Today Gu-Win, Elizabeth A, MD   2 weeks ago Chronic left shoulder pain   [place], [place], DO   3 weeks ago Essential hypertension   [place], Elizabeth A, MD   1 month ago Chronic left shoulder pain   [place], DO   2 months ago Suspected Covid-19 Virus Infection   [place], [place], MD      Future Appointments            In 3 weeks Lyndal Pulley, [place], [facility]

## 2019-04-21 NOTE — Assessment & Plan Note (Signed)
Could be related to medication. Asked her to continue not taking it and if no resolution within 1-2 weeks let us know. Taking pepcid for GERD currently with acceptable control.

## 2019-04-21 NOTE — Telephone Encounter (Signed)
Medication Refill - Medication: meclizine (ANTIVERT) 12.5 MG tablet    Has the patient contacted their pharmacy? Yes.   (Agent: If no, request that the patient contact the pharmacy for the refill.) (Agent: If yes, when and what did the pharmacy advise?)  Preferred Pharmacy (with phone number or street name):  [id number] - [place] [place] [address]  Phone: [phone] Fax: [phone]  Not a 24 hour pharmacy; exact hours not known.     Agent: Please be advised that RX refills may take up to 3 business days. We ask that you follow-up with your pharmacy.

## 2019-04-21 NOTE — Patient Instructions (Signed)
It could be coming from the medicine.   If you do not feel better let us know.

## 2019-04-21 NOTE — Telephone Encounter (Signed)
Routing to CMA 

## 2019-04-21 NOTE — Progress Notes (Signed)
   Subjective:   Patient ID: [name], female    DOB: 1945/11/03, 73 y.o.   MRN: [id number]  HPI The patient is a 73 YO female coming in for concerns about dizziness. She has had this since Saturday which is when she started the prescription nexium 40 mg daily. She had this daily off and on. Not predictable when it would happen. More with standing and does not feel like room is spinning. Does not feel like she will pass out. Feels more just weak with standing in her legs also. She stopped taking nexium Monday and has not seen much difference. This is the only change recently. No change to diet or exercise or water intake. No other changes to medications. Lasts for a minute or two and denies falls. She is being careful.   Review of Systems  Constitutional: Negative.   HENT: Negative.   Eyes: Negative.   Respiratory: Negative for cough, chest tightness and shortness of breath.   Cardiovascular: Negative for chest pain, palpitations and leg swelling.  Gastrointestinal: Negative for abdominal distention, abdominal pain, constipation, diarrhea, nausea and vomiting.  Musculoskeletal: Negative.   Skin: Negative.   Neurological: Positive for weakness.  Psychiatric/Behavioral: Negative.     Objective:  Physical Exam Constitutional:      Appearance: She is well-developed.  HENT:     Head: Normocephalic and atraumatic.  Neck:     Musculoskeletal: Normal range of motion.  Cardiovascular:     Rate and Rhythm: Normal rate and regular rhythm.  Pulmonary:     Effort: Pulmonary effort is normal. No respiratory distress.     Breath sounds: Normal breath sounds. No wheezing or rales.  Abdominal:     General: Bowel sounds are normal. There is no distension.     Palpations: Abdomen is soft.     Tenderness: There is no abdominal tenderness. There is no rebound.  Skin:    General: Skin is warm and dry.  Neurological:     Mental Status: She is alert and oriented to person, place, and time.   Coordination: Coordination normal.     Vitals:   04/21/19 0918  BP: 120/86  Pulse: 76  Temp: 98.8 F (37.1 C)  TempSrc: Oral  SpO2: 97%  Weight: 143 lb (64.9 kg)  Height: 5' (1.524 m)    Assessment & Plan:

## 2019-05-05 ENCOUNTER — Telehealth: Payer: Self-pay | Admitting: Internal Medicine

## 2019-05-05 MED ORDER — HYDROCHLOROTHIAZIDE 12.5 MG PO TABS: 12.5000 mg | ORAL_TABLET | Freq: Every day | ORAL | 3 refills | Status: DC

## 2019-05-05 NOTE — Telephone Encounter (Signed)
Pharmacy needed a new rx that said tablet in order to dispense. Sent in 12.5 tabs daily PO for patient

## 2019-05-05 NOTE — Telephone Encounter (Signed)
Copied from [place] [id number]. Topic: General - Other >> May 05, 2019 11:47 AM Keene Breath wrote: Reason for CRM: Patient called to inform the doctor that she is allergic to the hydrochlorothiazide (MICROZIDE) 12.5 MG capsule, turquoise coloring.  She stated that she can take the tablets that are white.  Please call patient to discuss at [phone]

## 2019-05-05 NOTE — Telephone Encounter (Signed)
I'm not sure why patient is calling us. We do not control which supply of hctz she gets that would be the pharmacy so she can call them and I'm sure they will be able to get ones she is okay to take.

## 2019-05-11 ENCOUNTER — Other Ambulatory Visit: Payer: Self-pay | Admitting: Internal Medicine

## 2019-05-16 ENCOUNTER — Ambulatory Visit (INDEPENDENT_AMBULATORY_CARE_PROVIDER_SITE_OTHER)
Admission: RE | Admit: 2019-05-16 | Discharge: 2019-05-16 | Disposition: A | Discharge status: Home / Self Care | Payer: Medicare HMO | Source: Ambulatory Visit | Attending: Family Medicine | Admitting: Family Medicine

## 2019-05-16 ENCOUNTER — Other Ambulatory Visit: Payer: Self-pay

## 2019-05-16 ENCOUNTER — Ambulatory Visit (INDEPENDENT_AMBULATORY_CARE_PROVIDER_SITE_OTHER): Payer: Medicare HMO | Admitting: Family Medicine

## 2019-05-16 ENCOUNTER — Encounter: Payer: Self-pay | Admitting: Family Medicine

## 2019-05-16 VITALS — BP 110/64 | HR 84 | Ht 60.0 in | Wt 139.0 lb

## 2019-05-16 DIAGNOSIS — G8929 Other chronic pain: Secondary | ICD-10-CM

## 2019-05-16 DIAGNOSIS — M19012 Primary osteoarthritis, left shoulder: Secondary | ICD-10-CM | POA: Diagnosis not present

## 2019-05-16 DIAGNOSIS — M25512 Pain in left shoulder: Secondary | ICD-10-CM

## 2019-05-16 DIAGNOSIS — M12812 Other specific arthropathies, not elsewhere classified, left shoulder: Secondary | ICD-10-CM

## 2019-05-16 NOTE — Patient Instructions (Addendum)
Good to see you Pennsaid small amount over most painful spot 2 times a day Xray downstairs See me again in 8 weeks

## 2019-05-16 NOTE — Progress Notes (Signed)
[name] [place], [place] [address] Phone: [phone] Subjective:   Fontaine No, am serving as a scribe for Dr. Hulan Saas.  I'm seeing this patient by the request  of:    CC: Left shoulder pain follow-up  [id number]   04/04/2019 Not as much improvement.  Discussed which activities to do which wants to avoid.  Increase activity as tolerated.  Will likely need injections intermittently.  Last injection was 10 weeks ago.  Hopefully patient will respond better to this.  Could be a candidate for PRP.  Patient wants to avoid any surgical intervention.  Follow-up 10 weeks  GT injection given today.  Tolerated the procedure well.  We discussed the muscle imbalances.  History of scoliosis that could be contributing.  Discussed posture and ergonomics, patient will increase activity.  Follow-up again in 4 to 8 weeks  Patient given injection today, tolerated the procedure well.  Discussed icing regimen and home exercises, increase activity slowly.  Follow-up again in 4 to 8 weeks.  Likely the underlying rotator cuff arthropathy is contributing.  Update 05/16/2019 [name] is a 73 y.o. female coming in with complaint of left shoulder pain and right hip pain. Hip pain has improved. Left shoulder continues to bother her especially with flexion. Denies any radiating symptoms. Anterior and superior pain.  Patient states the neck continues to give her discomfort on a fairly regular basis.  Last injection did not seem to be as helpful.  Patient states that the hip pain now is doing significantly better.     Past Medical History:  Diagnosis Date  . Arthritis    KNEES  . CTS (carpal tunnel syndrome)    BILATERAL LEFT > RIGHT (OCC. HAND NUMBNESS)  . Female cystocele   . GERD (gastroesophageal reflux disease)   . H/O hiatal hernia   . History of cervical dysplasia    CIN II  . History of peritonitis    2006--  S/P PERFORATED BOWEL INJURY  DURING SURGERY  . Hyperlipidemia    Past Surgical History:  Procedure Laterality Date  . ABDOMINAL HYSTERECTOMY  1991   W/ BILATERAL SALPINGOOPHORECTOMY  . BREAST BIOPSY    . LAPAROSCOPIC CHOLECYSTECTOMY  2006   W/ PERFERATED BOWEL (INJURY)  . NASAL SEPTUM SURGERY  70's   REPAIR DEVIATED SEPTUM  . TUBAL LIGATION  1980'S  . VAGINAL PROLAPSE REPAIR N/A 08/15/2012   Procedure: VAGINAL VAULT SUSPENSION;  Surgeon: Ailene Rud, MD;  Location: [facility];  Service: Urology;  Laterality: N/A;  ANTERIOR VAULT REPAIR WITH XENFORE   Social History   Socioeconomic History  . Marital status: Married    Spouse name: Not on file  . Number of children: Not on file  . Years of education: Not on file  . Highest education level: Not on file  Occupational History  . Not on file  Social Needs  . Financial resource strain: Not on file  . Food insecurity    Worry: Not on file    Inability: Not on file  . Transportation needs    Medical: Not on file    Non-medical: Not on file  Tobacco Use  . Smoking status: Never Smoker  . Smokeless tobacco: Never Used  Substance and Sexual Activity  . Alcohol use: No    Alcohol/week: 0.0 standard drinks    Comment: no  . Drug use: No  . Sexual activity: Not Currently    Birth control/protection:  Surgical  Lifestyle  . Physical activity    Days per week: Not on file    Minutes per session: Not on file  . Stress: Not on file  Relationships  . Social Herbalist on phone: Not on file    Gets together: Not on file    Attends religious service: Not on file    Active member of club or organization: Not on file    Attends meetings of clubs or organizations: Not on file    Relationship status: Not on file  Other Topics Concern  . Not on file  Social History Narrative   Married '68. 2 son - '69, '70; 1 son - '71: 2 grandchildren. 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   Allergies  Allergen Reactions  . Zocor  [Simvastatin]     12/2013 CK 528  . Advil [Ibuprofen] Swelling    Tongue swells  . Bran [Wheat Bran] Other (See Comments)    Very bad GI pain  . Hydrocodone Nausea Only and Other (See Comments)     TACHYCARDIA  . Hydrocodone-Acetaminophen Nausea Only    Other reaction(s): Increased Heart Rate (intolerance)  . Lipitor [Atorvastatin] Other (See Comments)    Bad dreams  . Pravachol [Pravastatin Sodium] Other (See Comments)    Bad dreams   Family History  Problem Relation Age of Onset  . Coronary artery disease Mother   . Other Mother        cva  . Hypertension Mother   . Heart disease Mother   . Other Father        CVA  . Stroke Father   . Cancer Sister 67       breast  . Breast cancer Sister   . Cancer Brother        prancreatic  . Cancer Sister 52       breast  . Breast cancer Sister   . Diabetes Other   . Coronary artery disease Other   . Hypertension Other   . Stroke Other   . Cancer Other        breast  . Colon cancer Neg Hx      Current Outpatient Medications (Cardiovascular):  .  hydrochlorothiazide (HYDRODIURIL) 12.5 MG tablet, Take 1 tablet (12.5 mg total) by mouth daily. .  rosuvastatin (CRESTOR) 20 MG tablet, Take 1 tablet (20 mg total) by mouth 3 (three) times a week.  Current Outpatient Medications (Respiratory):  .  fluticasone (FLONASE) 50 MCG/ACT nasal spray, Place 2 sprays into both nostrils daily.  Current Outpatient Medications (Analgesics):  .  acetaminophen (TYLENOL) 500 MG tablet, Take 500-750 mg by mouth every 6 (six) hours as needed for pain.   Current Outpatient Medications (Other):  .  esomeprazole (NEXIUM) 40 MG capsule, Take 1 capsule (40 mg total) by mouth daily. Marland Kitchen  estradiol (ESTRACE) 0.1 MG/GM vaginal cream, Place 2 g vaginally 3 (three) times a week. .  famotidine (PEPCID) 20 MG tablet, Take 1 tablet (20 mg total) by mouth at bedtime. .  meclizine (ANTIVERT) 12.5 MG tablet, Take 1 tablet (12.5 mg total) by mouth 3 (three) times  daily as needed for dizziness. .  Multiple Vitamin (MULTIVITAMIN) tablet, Take 1 tablet by mouth every other day.  .  Omega-3 Fatty Acids (FISH OIL PO), Take by mouth. Marland Kitchen  OVER THE COUNTER MEDICATION, Place 1 suppository rectally daily as needed (for hemmorhoids). Marland Kitchen  tetrahydrozoline (VISINE) 0.05 % ophthalmic solution, Place 2 drops into both eyes daily as  needed (for dry eyes).    Past medical history, social, surgical and family history all reviewed in electronic medical record.  No pertanent information unless stated regarding to the chief complaint.   Review of Systems:  No headache, visual changes, nausea, vomiting, diarrhea, constipation, dizziness, abdominal pain, skin rash, fevers, chills, night sweats, weight loss, swollen lymph nodes, body aches, joint swelling,  chest pain, shortness of breath, mood changes.  Positive muscle aches  Objective  Blood pressure 110/64, pulse 84, height 5' (1.524 m), weight 139 lb (63 kg), SpO2 98 %.    General: No apparent distress alert and oriented x3 mood and affect normal, dressed appropriately.  HEENT: Pupils equal, extraocular movements intact  Respiratory: Patient's speak in full sentences and does not appear short of breath  Cardiovascular: No lower extremity edema, non tender, no erythema  Skin: Warm dry intact with no signs of infection or rash on extremities or on axial skeleton.  Abdomen: Soft nontender  Neuro: Cranial nerves II through XII are intact, neurovascularly intact in all extremities with 2+ DTRs and 2+ pulses.  Lymph: No lymphadenopathy of posterior or anterior cervical chain or axillae bilaterally.   Gait mild antalgic MSK:  tender with full range of motion and good stability and symmetric strength and tone of , elbows, wrist, hip, knee and ankles bilaterally.  Arthritic changes of multiple joints Left shoulder exam shows the patient does have some atrophy of the musculature.  Patient does have some limited range of motion of  the shoulder in all planes with some mild crepitus.  After informed written and verbal consent, patient was seated on exam table. Left shoulder was prepped with alcohol swab and utilizing posterior approach, patient's right glenohumeral space was injected with 4:1  marcaine 0.5%: Kenalog 40mg /dL. Patient tolerated the procedure well without immediate complications.   Impression and Recommendations:     This case required medical decision making of moderate complexity. The above documentation has been reviewed and is accurate and complete Kimberly Pulley, DO       Note: This dictation was prepared with Dragon dictation along with smaller phrase technology. Any transcriptional errors that result from this process are unintentional.

## 2019-05-16 NOTE — Assessment & Plan Note (Signed)
Repeat injection given today.  Discussed the rotator cuff arthropathy, patient wants to avoid surgical intervention.  Discussed icing regimen and home exercises.  Patient will increase activity as tolerated.  Follow-up with me again 8 to 10 weeks.

## 2019-06-05 ENCOUNTER — Other Ambulatory Visit: Payer: Self-pay | Admitting: Internal Medicine

## 2019-06-05 DIAGNOSIS — Z1231 Encounter for screening mammogram for malignant neoplasm of breast: Secondary | ICD-10-CM

## 2019-06-13 ENCOUNTER — Other Ambulatory Visit: Payer: Self-pay | Admitting: Internal Medicine

## 2019-06-13 ENCOUNTER — Telehealth: Payer: Self-pay | Admitting: Internal Medicine

## 2019-06-13 MED ORDER — FAMOTIDINE 20 MG PO TABS: 20.0000 mg | ORAL_TABLET | Freq: Every day | ORAL | 2 refills | Status: DC

## 2019-06-13 NOTE — Telephone Encounter (Signed)
Copied from [place] [id number]. Topic: General - Other >> Jun 13, 2019  4:05 PM Keene Breath wrote: Reason for CRM: Patient would like to know if there is an alternative medication for Nexium that she can take.  CB# [phone]

## 2019-06-13 NOTE — Telephone Encounter (Signed)
Medication Refill - Medication: famotidine (PEPCID) 20 MG tablet   Preferred Pharmacy (with phone number or street name):  [id number] - [place], [place] Phone:  [phone]  Fax:  [phone]       Agent: Please be advised that RX refills may take up to 3 business days. We ask that you follow-up with your pharmacy.

## 2019-06-14 MED ORDER — FAMOTIDINE 20 MG PO TABS: 20.0000 mg | ORAL_TABLET | Freq: Two times a day (BID) | ORAL | 1 refills | Status: DC

## 2019-06-14 NOTE — Telephone Encounter (Signed)
Is she wanting to change due to cost? Not working? Other reason?

## 2019-06-14 NOTE — Telephone Encounter (Signed)
Pt informed of below. New updated Rx sent. See meds.

## 2019-06-14 NOTE — Telephone Encounter (Signed)
I called pt- she states she had an allergic reaction to esomeprazole (mainly the dye). She states it made her feel like she "had taken a tranquilizer." She states she stopped the medication and her sxs improved. She states she has tried Protonix and omeprazole and neither worked. She is requesting a generic also due to cost. She is still taking famotidine qd.

## 2019-06-14 NOTE — Telephone Encounter (Signed)
Can increase famotidine to BID. If not working would recommend she call us back and we can have her see GI specialist.

## 2019-06-19 ENCOUNTER — Encounter: Payer: Self-pay | Admitting: Radiology

## 2019-06-28 ENCOUNTER — Ambulatory Visit: Payer: Medicare HMO | Admitting: Family Medicine

## 2019-06-29 ENCOUNTER — Other Ambulatory Visit: Payer: Self-pay

## 2019-06-29 ENCOUNTER — Encounter: Payer: Self-pay | Admitting: Family Medicine

## 2019-06-29 ENCOUNTER — Ambulatory Visit (INDEPENDENT_AMBULATORY_CARE_PROVIDER_SITE_OTHER): Payer: Medicare HMO | Admitting: Family Medicine

## 2019-06-29 ENCOUNTER — Other Ambulatory Visit (HOSPITAL_COMMUNITY)
Admission: RE | Admit: 2019-06-29 | Discharge: 2019-06-29 | Disposition: A | Discharge status: Home / Self Care | Payer: Medicare HMO | Source: Ambulatory Visit | Attending: Family Medicine | Admitting: Family Medicine

## 2019-06-29 ENCOUNTER — Ambulatory Visit: Payer: Medicare HMO

## 2019-06-29 VITALS — BP 122/72 | HR 76 | Wt 143.0 lb

## 2019-06-29 DIAGNOSIS — N898 Other specified noninflammatory disorders of vagina: Secondary | ICD-10-CM

## 2019-06-29 DIAGNOSIS — R3915 Urgency of urination: Secondary | ICD-10-CM | POA: Diagnosis not present

## 2019-06-29 LAB — POCT URINALYSIS DIPSTICK: Leukocytes, UA: NEGATIVE

## 2019-06-29 NOTE — Progress Notes (Addendum)
    Subjective:    Patient ID: [name] is a 73 y.o. female presenting with Vaginitis and Urinary Retention  on 06/29/2019  HPI: Feels some pain related to her urination, and has similar feeling while sitting. Notes some vaginal discharge. Some vaginal irritation and discharge x 3 wks. Took cranberry juice which helps some.  Review of Systems  Constitutional: Negative for chills and fever.  Respiratory: Negative for shortness of breath.   Cardiovascular: Negative for chest pain.  Gastrointestinal: Negative for abdominal pain, nausea and vomiting.  Genitourinary: Negative for dysuria.  Skin: Negative for rash.      Objective:    BP 122/72   Pulse 76   Wt 143 lb (64.9 kg)   BMI 27.93 kg/m  Physical Exam Constitutional:      General: She is not in acute distress.    Appearance: She is well-developed.  HENT:     Head: Normocephalic and atraumatic.  Eyes:     General: No scleral icterus. Cardiovascular:     Rate and Rhythm: Normal rate.  Pulmonary:     Effort: Pulmonary effort is normal.  Abdominal:     Palpations: Abdomen is soft.  Genitourinary:    Comments: BUS normal, vagina is atrophic, urethra is prolapsed.  Musculoskeletal:     Cervical back: Neck supple.  Skin:    General: Skin is warm and dry.  Neurological:     Mental Status: She is alert and oriented to person, place, and time.    Urinalysis    Component Value Date/Time   COLORURINE YELLOW 07/20/2018 [place] 07/20/2018 0844   LABSPEC <=1.005 (A) 07/20/2018 0844   PHURINE 6.5 07/20/2018 0844   GLUCOSEU NEGATIVE 07/20/2018 0844   HGBUR TRACE-INTACT (A) 07/20/2018 0844   BILIRUBINUR NEGATIVE 07/20/2018 0844   BILIRUBINUR neg 08/27/2014 0849   KETONESUR NEGATIVE 07/20/2018 0844   PROTEINUR neg 08/27/2014 0849   PROTEINUR NEGATIVE 11/18/2012 2347   UROBILINOGEN 0.2 07/20/2018 0844   NITRITE NEGATIVE 07/20/2018 0844   LEUKOCYTESUR Negative 06/29/2019 0958            Assessment & Plan:   Problem List Items Addressed This Visit    None    Visit Diagnoses    Urinary urgency    -  Primary   some blood in uring--check culture and treat appropriately.   Relevant Orders   POCT Urinalysis Dipstick (Completed)   Urine Culture   Vaginal discharge       Check wet prep and treat appropriately.   Relevant Orders   Cervicovaginal ancillary only( [place])      Total face-to-face time with patient: 10 minutes. Over 50% of encounter was spent on counseling and coordination of care. Return if symptoms worsen or fail to improve.  Donnamae Jude 06/29/2019 11:07 AM

## 2019-06-29 NOTE — Progress Notes (Signed)
Urinary urgency, but that can't urinate and small amount of vaginal discharge X 3 weeks

## 2019-06-30 LAB — URINE CULTURE: Organism ID, Bacteria: NO GROWTH

## 2019-07-03 LAB — CERVICOVAGINAL ANCILLARY ONLY
Bacterial Vaginitis (gardnerella): NEGATIVE
Candida Glabrata: NEGATIVE
Candida Vaginitis: NEGATIVE
Comment: NEGATIVE
Comment: NEGATIVE
Comment: NEGATIVE

## 2019-07-17 DIAGNOSIS — R300 Dysuria: Secondary | ICD-10-CM | POA: Diagnosis not present

## 2019-07-17 DIAGNOSIS — N952 Postmenopausal atrophic vaginitis: Secondary | ICD-10-CM | POA: Diagnosis not present

## 2019-07-24 DIAGNOSIS — Z961 Presence of intraocular lens: Secondary | ICD-10-CM | POA: Diagnosis not present

## 2019-07-24 DIAGNOSIS — H0014 Chalazion left upper eyelid: Secondary | ICD-10-CM | POA: Diagnosis not present

## 2019-07-26 ENCOUNTER — Encounter: Payer: Self-pay | Admitting: Family Medicine

## 2019-07-26 ENCOUNTER — Ambulatory Visit
Admission: RE | Admit: 2019-07-26 | Discharge: 2019-07-26 | Disposition: A | Discharge status: Home / Self Care | Payer: Medicare HMO | Source: Ambulatory Visit | Attending: Internal Medicine | Admitting: Internal Medicine

## 2019-07-26 ENCOUNTER — Ambulatory Visit (INDEPENDENT_AMBULATORY_CARE_PROVIDER_SITE_OTHER): Payer: Medicare HMO | Admitting: Family Medicine

## 2019-07-26 ENCOUNTER — Other Ambulatory Visit: Payer: Self-pay

## 2019-07-26 VITALS — BP 108/69 | HR 90 | Wt 143.0 lb

## 2019-07-26 DIAGNOSIS — Z1231 Encounter for screening mammogram for malignant neoplasm of breast: Secondary | ICD-10-CM | POA: Diagnosis not present

## 2019-07-26 DIAGNOSIS — N952 Postmenopausal atrophic vaginitis: Secondary | ICD-10-CM | POA: Diagnosis not present

## 2019-07-26 DIAGNOSIS — Z0000 Encounter for general adult medical examination without abnormal findings: Secondary | ICD-10-CM | POA: Diagnosis not present

## 2019-07-26 NOTE — Progress Notes (Deleted)
   GYNECOLOGY ANNUAL PREVENTATIVE CARE ENCOUNTER NOTE  Subjective:   [name] is a 74 y.o. [phone] female here for a routine annual gynecologic exam.  Current complaints: ***.   Denies abnormal vaginal bleeding, discharge, pelvic pain, problems with intercourse or other gynecologic concerns.    Gynecologic History No LMP recorded. Patient has had a hysterectomy. Contraception: {method:5051} Last Pap: ***. Results were: {norm/abn:16337} Last mammogram: ***. Results were: {norm/abn:16337}  The following portions of the patient's history were reviewed and updated as appropriate: allergies, current medications, past family history, past medical history, past social history, past surgical history and problem list.  Review of Systems {ros; complete:30496}   Objective:  BP 108/69   Pulse 90   Wt 143 lb (64.9 kg)   BMI 27.93 kg/m  CONSTITUTIONAL: Well-developed, well-nourished female in no acute distress.  HENT:  Normocephalic, atraumatic, External right and left ear normal. Oropharynx is clear and moist EYES:  No scleral icterus.  NECK: Normal range of motion, supple, no masses.  Normal thyroid.  SKIN: Skin is warm and dry. No rash noted. Not diaphoretic. No erythema. No pallor. NEUROLOGIC: Alert and oriented to person, place, and time. Normal reflexes, muscle tone coordination. No cranial nerve deficit noted. PSYCHIATRIC: Normal mood and affect. Normal behavior. Normal judgment and thought content. CARDIOVASCULAR: Normal heart rate noted, regular rhythm. 2+ distal pulses. RESPIRATORY: Effort and breath sounds normal, no problems with respiration noted. BREASTS: Symmetric in size. No masses, skin changes, nipple drainage, or lymphadenopathy. ABDOMEN: Soft,  no distention noted.  No tenderness, rebound or guarding.  PELVIC: not indicated, recently had exam MUSCULOSKELETAL: Normal range of motion.      Assessment and Plan:  1) Annual gynecologic examination ***with pap smear:   Will follow up results of pap smear and manage accordingly. ***STI screen also ordered today.  Routine preventative health maintenance measures emphasized.  2) Contraception counseling: Reviewed all forms of birth control options available including abstinence; over the counter/barrier methods; hormonal contraceptive medication including pill, patch, ring, injection,contraceptive implant; hormonal and nonhormonal IUDs; permanent sterilization options including vasectomy and the various tubal sterilization modalities. Risks and benefits reviewed.  Questions were answered.  Written information was also given to the patient to review.  Patient desires ***, this was prescribed for patient. She will follow up in  *** for surveillance.  She was told to call with any further questions, or with any concerns about this method of contraception.  Emphasized use of condoms 100% of the time for STI prevention.   Please refer to After Visit Summary for other counseling recommendations.   Return in about 1 year (around 07/25/2020) for Yearly wellness exam.  Caren Macadam, MD, MPH, ABFM Attending Physician Center for [facility]

## 2019-07-26 NOTE — Progress Notes (Signed)
Subjective:     [name] is a 74 y.o. female here at [facility] for a routine exam.  Current complaints: vaginal dryness.    Patient was recently seen by Dr. Kennon Rounds on 06/29/19, for concerns of vaginal dryness and discharge, likely 2/2 patient stopping estrace cream. She reports she ran out of refills for the cream and had been unable to go to the doctor to get a prescription. At that time physical exam and UA/wetprep was normal, patient was given new prescription.  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 She had a mammogram this morning.   Gynecologic History No LMP recorded. Patient has had a hysterectomy. Contraception: post menopausal status Last Pap: 2016. Results were: normal Last mammogram: Tpday (07/26/19). Results were: pending  Obstetric History OB History  Gravida Para Term Preterm AB Living  3 3 2 1  0 3  SAB TAB Ectopic Multiple Live Births  0 0 0 0 3    # Outcome Date GA Lbr Len/2nd Weight Sex Delivery Anes PTL Lv  3 Preterm      Vag-Spont   LIV  2 Term      Vag-Spont   LIV  1 Term      Vag-Spont   LIV    Review of Systems Pertinent items noted in HPI and remainder of comprehensive ROS otherwise negative.    Objective:   VS reviewed, nursing note reviewed,  Constitutional: well developed, well nourished, no distress HEENT: normocephalic Pulm/chest wall: normal effort Abdomen: soft Neuro: alert and oriented x 3 Skin: warm, dry Psych: affect normal Breast: No masses, tenderness, asymmetry, nipple discharge or axillary lymphadenopathy. Left breast has ~1cm hypopigmented macule just medial to the nipple and three hyperpigmented 34mm areolar moles, at 5 and 7 o'clock from the nipple, and centered on the nipple.      Assessment/Plan:  1. Well women with no gyn exam Patient deferred pelvic exam given normal exam on 06/29/19. Given  patient's recent lapse in utilization of estrace cream and lack of other symptoms or lab findings, patient's dryness is most likely from atrophic vaginitis. Patient understands and will call if symptoms do not improve.    -Continue estrace cream 3x weekly   Follow up in: 1 year or as needed.   Phill Mutter, Medical Student 2:52 PM

## 2019-07-28 ENCOUNTER — Other Ambulatory Visit: Payer: Self-pay | Admitting: Internal Medicine

## 2019-07-28 DIAGNOSIS — R928 Other abnormal and inconclusive findings on diagnostic imaging of breast: Secondary | ICD-10-CM

## 2019-08-04 ENCOUNTER — Ambulatory Visit
Admission: RE | Admit: 2019-08-04 | Discharge: 2019-08-04 | Disposition: A | Discharge status: Home / Self Care | Payer: Medicare HMO | Source: Ambulatory Visit | Attending: Internal Medicine | Admitting: Internal Medicine

## 2019-08-04 ENCOUNTER — Other Ambulatory Visit: Payer: Self-pay

## 2019-08-04 DIAGNOSIS — R928 Other abnormal and inconclusive findings on diagnostic imaging of breast: Secondary | ICD-10-CM

## 2019-08-04 DIAGNOSIS — N6011 Diffuse cystic mastopathy of right breast: Secondary | ICD-10-CM | POA: Diagnosis not present

## 2019-08-11 ENCOUNTER — Ambulatory Visit: Payer: Medicare HMO | Attending: Internal Medicine

## 2019-08-11 DIAGNOSIS — Z23 Encounter for immunization: Secondary | ICD-10-CM

## 2019-08-11 NOTE — Progress Notes (Signed)
   Covid-19 Vaccination Clinic  Name:  [name]    MRN: [id number] DOB: 1946-05-05  08/11/2019  [name] was observed post Covid-19 immunization for 30 minutes based on pre-vaccination screening without incidence. She was provided with Vaccine Information Sheet and instruction to access the V-Safe system.   [name] was instructed to call 911 with any severe reactions post vaccine: Marland Kitchen Difficulty breathing  . Swelling of your face and throat  . A fast heartbeat  . A bad rash all over your body  . Dizziness and weakness    Immunizations Administered    Name Date Dose VIS Date Route   Pfizer COVID-19 Vaccine 08/11/2019  8:21 AM 0.3 mL 06/09/2019 Intramuscular   Manufacturer: [place]   Lot: [id number]   [place]: [id number]

## 2019-08-18 ENCOUNTER — Telehealth: Payer: Self-pay | Admitting: Internal Medicine

## 2019-08-18 NOTE — Progress Notes (Signed)
°  Chronic Care Management   Outreach Note  08/18/2019 Name: [name] MRN: [id number] DOB: [birth year]  Referred by: Hoyt Koch, MD Reason for referral : No chief complaint on file.   An unsuccessful telephone outreach was attempted today. The patient was referred to the pharmacist for assistance with care management and care coordination.   Follow Up Plan:   [name] [employer] Scheduler

## 2019-08-18 NOTE — Chronic Care Management (AMB) (Signed)
  Chronic Care Management   Note  08/18/2019 Name: MADDISYN HEGWOOD MRN: [id number] DOB: 1946/03/27  NOVALYN LAJARA is a 74 y.o. year old female who is a primary care patient of Hoyt Koch, MD. I reached out to [name] by phone today in response to a referral sent by Ms. Regenia Skeeter Fillingim's PCP, Hoyt Koch, MD.   Ms. Stangeland was given information about Chronic Care Management services today including:  1. CCM service includes personalized support from designated clinical staff supervised by her physician, including individualized plan of care and coordination with other care providers 2. 24/7 contact phone numbers for assistance for urgent and routine care needs. 3. Service will only be billed when office clinical staff spend 20 minutes or more in a month to coordinate care. 4. Only one practitioner may furnish and bill the service in a calendar month. 5. The patient may stop CCM services at any time (effective at the end of the month) by phone call to the office staff. 6. The patient will be responsible for cost sharing (co-pay) of up to 20% of the service fee (after annual deductible is met).  Patient agreed to services and verbal consent obtained.   Follow up plan:   [name] [employer] Scheduler

## 2019-08-30 NOTE — Chronic Care Management (AMB) (Signed)
Chronic Care Management Pharmacy  Name: [name]  MRN: [id number] DOB: [birth year]   Chief Complaint/ HPI  [name],  74 y.o. , female presents for their Initial CCM visit with the clinical pharmacist via telephone due to COVID-19 Pandemic.  PCP : Hoyt Koch, MD  Their chronic conditions include: HTN, HLD, GERD, arthritis  Goes to [employer], helps with collection on Fridays. Not visiting much with family right now. Has a twin sister. Lives with 1 of her sons. Grandchildren 17.  Office Visits: 04/21/19 Dr Sharlet Salina OV: c/o dizziness, possibly d/t Nexium which was started recently. Continue holding.  03/30/19 Dr Sharlet Salina OV: R hip pain - suspected bursitis, OTC creams and stretching.   Consult Visit: 07/26/19 Dr Ernestina Patches (OB/GYN): well woman exam, continue estrace cream for atrophic vaginitis.   05/16/19 Dr Tamala Julian (sports medicine): injection for rotator cuff arthropathy  Medications: Outpatient Encounter Medications as of 08/31/2019  Medication Sig Note  . acetaminophen (TYLENOL) 500 MG tablet Take 500-750 mg by mouth every 6 (six) hours as needed for pain.   Marland Kitchen alum & mag hydroxide-simeth (MAALOX/MYLANTA) 200-200-20 MG/5ML suspension Take 15 mLs by mouth as needed for indigestion or heartburn.   . esomeprazole (NEXIUM) 40 MG capsule Take 1 capsule (40 mg total) by mouth daily. 08/31/2019: Using OTC Nexium due tolerability issues with Rx version  . estradiol (ESTRACE) 0.1 MG/GM vaginal cream Place 2 g vaginally 3 (three) times a week.   . famotidine (PEPCID) 20 MG tablet Take 1 tablet (20 mg total) by mouth 2 (two) times daily.   . fluticasone (FLONASE) 50 MCG/ACT nasal spray Place 2 sprays into both nostrils daily.   . hydrochlorothiazide (HYDRODIURIL) 12.5 MG tablet Take 1 tablet (12.5 mg total) by mouth daily.   . meclizine (ANTIVERT) 12.5 MG tablet Take 1 tablet (12.5 mg total) by mouth 3 (three) times daily as needed for dizziness.   . Multiple  Vitamin (MULTIVITAMIN) tablet Take 1 tablet by mouth every other day.    . Omega-3 Fatty Acids (FISH OIL PO) Take by mouth.   Vladimir Faster Glycol-Propyl Glycol (SYSTANE) 0.4-0.3 % SOLN Apply to eye.   . rosuvastatin (CRESTOR) 20 MG tablet Take 1 tablet (20 mg total) by mouth 3 (three) times a week.   . tetrahydrozoline (VISINE) 0.05 % ophthalmic solution Place 2 drops into both eyes daily as needed (for dry eyes).   Marland Kitchen OVER THE COUNTER MEDICATION Place 1 suppository rectally daily as needed (for hemmorhoids).    No facility-administered encounter medications on file as of 08/31/2019.     Current Diagnosis/Assessment:  Goals Addressed            This Visit's Progress   . Pharmacy Care Plan       Current Barriers:  . Chronic Disease Management support, education, and care coordination needs related to HTN, HLD, and GERD, allergies  Pharmacist Clinical Goal(s):  Marland Kitchen Maintain LDL (bad cholesterol) < 100 . Maintain BP < 130/80 . Ensure safety, efficacy, and affordability of medications  Interventions: . Comprehensive medication review performed. . Discussed how diet changes can help prevent heart disease and diabetes . Obtain OTC Nexium from [facility] at no cost . Take famotidine ideally 15-30 minutes before food that may cause heartburn  Patient Self Care Activities:  . Self administers medications as prescribed, Calls pharmacy for medication refills, and Calls provider office for new concerns or questions  Initial goal documentation       Hypertension   Office  blood pressures are  BP Readings from Last 3 Encounters:  07/26/19 108/69  06/29/19 122/72  05/16/19 110/64    Patient has failed these meds in the past: n/a Patient is currently controlled on the following medications: HCTZ 12.5 mg daily  Patient checks BP at home 3-5x per week  Patient home BP readings are ranging: 129/80 yesterday  We discussed diet and exercise extensively, BP goals and assured patient she is  currently at goal.   Plan  Continue current medications and control with diet and exercise   Hyperlipidemia   Lipid Panel     Component Value Date/Time   CHOL 227 (H) 05/16/2018 1502   TRIG 140.0 05/16/2018 1502   HDL 58.80 05/16/2018 1502   CHOLHDL 4 05/16/2018 1502   VLDL 28.0 05/16/2018 1502   LDLCALC 140 (H) 05/16/2018 1502    ASCVD 10-year risk: 12%  Patient has failed these meds in past: simvastatin Patient is currently uncontrolled on the following medications: rosuvastatin 20 mg 3x per week, OTC fish oil  We discussed:  diet and exercise extensively, discussed role of cholesterol in heart disease, mechanism of statins, benefits of statin outside of just lowering cholesterol. Discussed plate method and foods to avoid.  Pt reports she was previously taken off statins due to elevated "enzymes", per chart CK was elevated on simvastatin. Last CK was 391 in 2016, prior to initiating rosuvastatin.   Plan  Continue current medications and control with diet and exercise  Recommend to check lipid panel and CK at next PCP visit to determine if statin adjustments are needed  Prediabetes   Recent Relevant Labs: Lab Results  Component Value Date/Time   HGBA1C 6.1 05/16/2018 03:02 PM   HGBA1C 6.0 05/14/2017 10:14 AM   No medication indicated.  We discussed: pt expressed concern over statins causing diabetes, discussed evidence. Counseled on diet and exercise extensively.  Plan  Continue control with diet and exercise    GERD   Patient has failed these meds in past: rx esomeprazole (dizziness), pantoprazole (vivid dreams), omeprazole (dreams) Patient is currently controlled on the following medications: OTC Nexium, famotidine 20 mg BID, Mylanta  We discussed: pt has not tolerated several other PPIs, but can tolerate OTC Nexium which costs ~$15 for 28 tablets. Discussed [employer]'s OTC med delivery program, pt agreed to try this to get Nexium for free. Also discussed optimal  timing of famotidine and Mylanta to manage GERD symptoms.  Plan  Continue current medications  Get OTC Nexium through [place]   Patient has failed these meds in past: n/a Patient is currently controlled on the following medications: Flonase, Visine  We discussed:  Pt uses Flonase infrequently, allergies are currently controlled.  Plan  Continue current medications   Postmenopausal symptoms   Patient has failed these meds in past: n/a Patient is currently controlled on the following medications: estradiol 0.1 mg/gm vaginal crm TIW, 100% cranberry juice  We discussed:  Pt recently restarted Estrace cream, it is helping a little bit now. She was told to drink cranberry juice to help with urinary health, she has been using 100% cranberry juice that is very bitter. Recommended to try cranberry extract tablets, and avoid cranberry juice cocktail d/t high sugar content.  Plan  Continue current medications Replace cranberry juice with cranberry tablets  Health Maintenance   Patient is currently controlled on the following medications: Tylenol, meclizine 12.5 mg TID prn, multivitamin  We discussed:  Pt is satisfied with OTC regimen, may use [employer]  for OTC products.  Plan  Continue current medications   Medication Management   Pt uses [facility] for all medications Does not use pill box Pt endorses compliances with meds  We discussed:  Benefits of medication synchronization, packaging and delivery with [employer]. Pt elects to [facility] with [facility] for now.  Plan  Continue current med management strategy   Follow up:  6 month phone visit  Charlene Brooke, PharmD Clinical Pharmacist [facility] at [facility] [phone]

## 2019-08-31 ENCOUNTER — Other Ambulatory Visit: Payer: Self-pay

## 2019-08-31 ENCOUNTER — Ambulatory Visit: Payer: Medicare HMO | Admitting: Pharmacist

## 2019-08-31 DIAGNOSIS — R7301 Impaired fasting glucose: Secondary | ICD-10-CM

## 2019-08-31 DIAGNOSIS — K2100 Gastro-esophageal reflux disease with esophagitis, without bleeding: Secondary | ICD-10-CM

## 2019-08-31 DIAGNOSIS — I10 Essential (primary) hypertension: Secondary | ICD-10-CM

## 2019-08-31 DIAGNOSIS — E782 Mixed hyperlipidemia: Secondary | ICD-10-CM

## 2019-08-31 NOTE — Patient Instructions (Addendum)
Visit Information  Thank you for meeting with me to discuss your medications! I look forward to working with you to achieve your health care goals. Below is a summary of what we talked about during the visit:  Goals Addressed            This Visit's Progress   . Pharmacy Care Plan       Current Barriers:  . Chronic Disease Management support, education, and care coordination needs related to HTN, HLD, and GERD, allergies  Pharmacist Clinical Goal(s):  Marland Kitchen Maintain LDL (bad cholesterol) < 100 . Maintain BP < 130/80 . Ensure safety, efficacy, and affordability of medications  Interventions: . Comprehensive medication review performed. . Discussed how diet changes can help prevent heart disease and diabetes . Obtain OTC Nexium from [facility] at no cost . Take famotidine ideally 15-30 minutes before food that may cause heartburn  Patient Self Care Activities:  . Self administers medications as prescribed, Calls pharmacy for medication refills, and Calls provider office for new concerns or questions  Initial goal documentation       [name] was given information about Chronic Care Management services today including:  1. CCM service includes personalized support from designated clinical staff supervised by her physician, including individualized plan of care and coordination with other care providers 2. 24/7 contact phone numbers for assistance for urgent and routine care needs. 3. Service will only be billed when office clinical staff spend 20 minutes or more in a month to coordinate care. 4. Only one practitioner may furnish and bill the service in a calendar month. 5. The patient may stop CCM services at any time (effective at the end of the month) by phone call to the office staff. 6. Depending on plan coverage, the patient may be responsible for cost sharing (co-pay) of up to 20% of the service fee (after annual deductible is met).  Patient agreed to services and verbal consent  obtained.   The patient verbalized understanding of instructions provided today and agreed to receive a mailed copy of patient instruction and/or educational materials. Telephone follow up appointment with pharmacy team member scheduled for: 03/07/20 @ 11:30am  Charlene Brooke, PharmD Clinical Pharmacist [facility] at [facility] [phone]  Heart-Healthy Eating Plan Heart-healthy meal planning includes:  Eating less unhealthy fats.  Eating more healthy fats.  Making other changes in your diet. Talk with your doctor or a diet specialist (dietitian) to create an eating plan that is right for you. What are tips for following this plan? Cooking Avoid frying your food. Try to bake, boil, grill, or broil it instead. You can also reduce fat by:  Removing the skin from poultry.  Removing all visible fats from meats.  Steaming vegetables in water or broth. Meal planning   At meals, divide your plate into four equal parts: ? Fill one-half of your plate with vegetables and green salads. ? Fill one-fourth of your plate with whole grains. ? Fill one-fourth of your plate with lean protein foods.  Eat 4-5 servings of vegetables per day. A serving of vegetables is: ? 1 cup of raw or cooked vegetables. ? 2 cups of raw leafy greens.  Eat 4-5 servings of fruit per day. A serving of fruit is: ? 1 medium whole fruit. ?  cup of dried fruit. ?  cup of fresh, frozen, or canned fruit. ?  cup of 100% fruit juice.  Eat more foods that have soluble fiber. These are apples, broccoli, carrots, beans,  peas, and barley. Try to get 20-30 g of fiber per day.  Eat 4-5 servings of nuts, legumes, and seeds per week: ? 1 serving of dried beans or legumes equals  cup after being cooked. ? 1 serving of nuts is  cup. ? 1 serving of seeds equals 1 tablespoon. General information  Eat more home-cooked food. Eat less restaurant, buffet, and fast food.  Limit or avoid alcohol.  Limit  foods that are high in starch and sugar.  Avoid fried foods.  Lose weight if you are overweight.  Keep track of how much salt (sodium) you eat. This is important if you have high blood pressure. Ask your doctor to tell you more about this.  Try to add vegetarian meals each week. Fats  Choose healthy fats. These include olive oil and canola oil, flaxseeds, walnuts, almonds, and seeds.  Eat more omega-3 fats. These include salmon, mackerel, sardines, tuna, flaxseed oil, and ground flaxseeds. Try to eat fish at least 2 times each week.  Check food labels. Avoid foods with trans fats or high amounts of saturated fat.  Limit saturated fats. ? These are often found in animal products, such as meats, butter, and cream. ? These are also found in plant foods, such as palm oil, palm kernel oil, and coconut oil.  Avoid foods with partially hydrogenated oils in them. These have trans fats. Examples are stick margarine, some tub margarines, cookies, crackers, and other baked goods. What foods can I eat? Fruits All fresh, canned (in natural juice), or frozen fruits. Vegetables Fresh or frozen vegetables (raw, steamed, roasted, or grilled). Green salads. Grains Most grains. Choose whole wheat and whole grains most of the time. Rice and pasta, including brown rice and pastas made with whole wheat. Meats and other proteins Lean, well-trimmed beef, veal, pork, and lamb. Chicken and [place] without skin. All fish and shellfish. Wild duck, rabbit, pheasant, and venison. Egg whites or low-cholesterol egg substitutes. Dried beans, peas, lentils, and tofu. Seeds and most nuts. Dairy Low-fat or nonfat cheeses, including ricotta and mozzarella. Skim or 1% milk that is liquid, powdered, or evaporated. Buttermilk that is made with low-fat milk. Nonfat or low-fat yogurt. Fats and oils Non-hydrogenated (trans-free) margarines. Vegetable oils, including soybean, sesame, sunflower, olive, peanut, safflower, corn,  canola, and cottonseed. Salad dressings or mayonnaise made with a vegetable oil. Beverages Mineral water. Coffee and tea. Diet carbonated beverages. Sweets and desserts Sherbet, gelatin, and fruit ice. Small amounts of dark chocolate. Limit all sweets and desserts. Seasonings and condiments All seasonings and condiments. The items listed above may not be a complete list of foods and drinks you can eat. Contact a dietitian for more options. What foods should I avoid? Fruits Canned fruit in heavy syrup. Fruit in cream or butter sauce. Fried fruit. Limit coconut. Vegetables Vegetables cooked in cheese, cream, or butter sauce. Fried vegetables. Grains Breads that are made with saturated or trans fats, oils, or whole milk. Croissants. Sweet rolls. Donuts. High-fat crackers, such as cheese crackers. Meats and other proteins Fatty meats, such as hot dogs, ribs, sausage, bacon, rib-eye roast or steak. High-fat deli meats, such as salami and bologna. Caviar. Domestic duck and goose. Organ meats, such as liver. Dairy Cream, sour cream, cream cheese, and creamed cottage cheese. Whole-milk cheeses. Whole or 2% milk that is liquid, evaporated, or condensed. Whole buttermilk. Cream sauce or high-fat cheese sauce. Yogurt that is made from whole milk. Fats and oils Meat fat, or shortening. Cocoa butter, hydrogenated oils, palm  oil, coconut oil, palm kernel oil. Solid fats and shortenings, including bacon fat, salt pork, lard, and butter. Nondairy cream substitutes. Salad dressings with cheese or sour cream. Beverages Regular sodas and juice drinks with added sugar. Sweets and desserts Frosting. Pudding. Cookies. Cakes. Pies. Milk chocolate or white chocolate. Buttered syrups. Full-fat ice cream or ice cream drinks. The items listed above may not be a complete list of foods and drinks to avoid. Contact a dietitian for more information. Summary  Heart-healthy meal planning includes eating less unhealthy  fats, eating more healthy fats, and making other changes in your diet.  Eat a balanced diet. This includes fruits and vegetables, low-fat or nonfat dairy, lean protein, nuts and legumes, whole grains, and heart-healthy oils and fats. This information is not intended to replace advice given to you by your health care provider. Make sure you discuss any questions you have with your health care provider. Document Revised: 08/19/2017 Document Reviewed: 07/23/2017 Elsevier Patient Education  2020 [employer].

## 2019-09-02 ENCOUNTER — Ambulatory Visit: Payer: Medicare HMO | Attending: Internal Medicine

## 2019-09-02 DIAGNOSIS — Z23 Encounter for immunization: Secondary | ICD-10-CM | POA: Insufficient documentation

## 2019-09-02 NOTE — Addendum Note (Signed)
Addended by: Aviva Signs M on: 09/02/2019 10:27 AM   Modules accepted: Orders

## 2019-09-02 NOTE — Progress Notes (Signed)
   Covid-19 Vaccination Clinic  Name:  [name]    MRN: [id number] DOB: 11/13/1945  09/02/2019  [name] was observed post Covid-19 immunization for 30 minutes based on pre-vaccination screening without incident. She was provided with Vaccine Information Sheet and instruction to access the V-Safe system.   [name] was instructed to call 911 with any severe reactions post vaccine: Marland Kitchen Difficulty breathing  . Swelling of face and throat  . A fast heartbeat  . A bad rash all over body  . Dizziness and weakness   Immunizations Administered    Name Date Dose VIS Date Route   Pfizer COVID-19 Vaccine 09/02/2019  2:16 PM 0.3 mL 06/09/2019 Intramuscular   Manufacturer: [place]   Lot: [id number]   [place]: [id number]

## 2019-11-23 DIAGNOSIS — N952 Postmenopausal atrophic vaginitis: Secondary | ICD-10-CM | POA: Diagnosis not present

## 2019-11-23 DIAGNOSIS — R300 Dysuria: Secondary | ICD-10-CM | POA: Diagnosis not present

## 2019-11-23 DIAGNOSIS — R102 Pelvic and perineal pain: Secondary | ICD-10-CM | POA: Diagnosis not present

## 2019-12-12 DIAGNOSIS — M62838 Other muscle spasm: Secondary | ICD-10-CM | POA: Diagnosis not present

## 2019-12-12 DIAGNOSIS — M6289 Other specified disorders of muscle: Secondary | ICD-10-CM | POA: Diagnosis not present

## 2019-12-12 DIAGNOSIS — R102 Pelvic and perineal pain: Secondary | ICD-10-CM | POA: Diagnosis not present

## 2019-12-12 DIAGNOSIS — R300 Dysuria: Secondary | ICD-10-CM | POA: Diagnosis not present

## 2019-12-18 DIAGNOSIS — M6289 Other specified disorders of muscle: Secondary | ICD-10-CM | POA: Diagnosis not present

## 2019-12-18 DIAGNOSIS — R102 Pelvic and perineal pain: Secondary | ICD-10-CM | POA: Diagnosis not present

## 2019-12-18 DIAGNOSIS — M62838 Other muscle spasm: Secondary | ICD-10-CM | POA: Diagnosis not present

## 2019-12-18 DIAGNOSIS — M6281 Muscle weakness (generalized): Secondary | ICD-10-CM | POA: Diagnosis not present

## 2020-01-17 DIAGNOSIS — R102 Pelvic and perineal pain: Secondary | ICD-10-CM | POA: Diagnosis not present

## 2020-01-17 DIAGNOSIS — M62838 Other muscle spasm: Secondary | ICD-10-CM | POA: Diagnosis not present

## 2020-01-17 DIAGNOSIS — R300 Dysuria: Secondary | ICD-10-CM | POA: Diagnosis not present

## 2020-01-17 DIAGNOSIS — M6289 Other specified disorders of muscle: Secondary | ICD-10-CM | POA: Diagnosis not present

## 2020-01-17 DIAGNOSIS — M6281 Muscle weakness (generalized): Secondary | ICD-10-CM | POA: Diagnosis not present

## 2020-02-14 ENCOUNTER — Telehealth: Payer: Self-pay | Admitting: Internal Medicine

## 2020-02-14 NOTE — Telephone Encounter (Signed)
Noted  

## 2020-02-14 NOTE — Telephone Encounter (Signed)
[facility] REPORT/CALL : Caller has cough and nasal congestion. Caller wants to know if she needs to get tested; she may have been exposed to covid.  Advise by nurse - HOME CARE given.

## 2020-03-07 ENCOUNTER — Ambulatory Visit: Payer: Medicare HMO | Admitting: Pharmacist

## 2020-03-07 ENCOUNTER — Other Ambulatory Visit: Payer: Self-pay

## 2020-03-07 DIAGNOSIS — I10 Essential (primary) hypertension: Secondary | ICD-10-CM

## 2020-03-07 DIAGNOSIS — K2100 Gastro-esophageal reflux disease with esophagitis, without bleeding: Secondary | ICD-10-CM

## 2020-03-07 DIAGNOSIS — E782 Mixed hyperlipidemia: Secondary | ICD-10-CM

## 2020-03-07 NOTE — Chronic Care Management (AMB) (Signed)
Chronic Care Management Pharmacy  Name: KASY IANNACONE  MRN: [id number] DOB: 01/11/1946   Chief Complaint/ HPI  [name],  74 y.o. , female presents for their Follow-Up CCM visit with the clinical pharmacist via telephone due to COVID-19 Pandemic.  PCP : Hoyt Koch, MD  Their chronic conditions include: HTN, HLD, GERD, arthritis  Pt goes to [employer], helps with collection on Fridays. Not visiting much with family right now. Has a twin sister. Lives with 1 of her sons. Has 17 grandchildren.  Office Visits: 04/21/19 Dr Sharlet Salina OV: c/o dizziness, possibly d/t Nexium which was started recently. Continue holding.  03/30/19 Dr Sharlet Salina OV: R hip pain - suspected bursitis, OTC creams and stretching.   Consult Visit: 07/26/19 Dr Ernestina Patches (OB/GYN): well woman exam, continue estrace cream for atrophic vaginitis.   05/16/19 Dr Tamala Julian (sports medicine): injection for rotator cuff arthropathy  Allergies  Allergen Reactions  . Zocor [Simvastatin]     12/2013 CK 528  . Advil [Ibuprofen] Swelling    Tongue swells  . Bran [Wheat Bran] Other (See Comments)    Very bad GI pain  . Hydrocodone Nausea Only and Other (See Comments)     TACHYCARDIA  . Hydrocodone-Acetaminophen Nausea Only    Other reaction(s): Increased Heart Rate (intolerance)  . Lipitor [Atorvastatin] Other (See Comments)    Bad dreams  . Pravachol [Pravastatin Sodium] Other (See Comments)    Bad dreams   Medications: Outpatient Encounter Medications as of 03/07/2020  Medication Sig Note  . acetaminophen (TYLENOL) 500 MG tablet Take 500-750 mg by mouth every 6 (six) hours as needed for pain.   Marland Kitchen alum & mag hydroxide-simeth (MAALOX/MYLANTA) 200-200-20 MG/5ML suspension Take 15 mLs by mouth as needed for indigestion or heartburn.   . esomeprazole (NEXIUM) 40 MG capsule Take 1 capsule (40 mg total) by mouth daily. 08/31/2019: Using OTC Nexium due tolerability issues with Rx version  . estradiol  (ESTRACE) 0.1 MG/GM vaginal cream Place 2 g vaginally 3 (three) times a week.   . famotidine (PEPCID) 20 MG tablet Take 1 tablet (20 mg total) by mouth 2 (two) times daily.   . fluticasone (FLONASE) 50 MCG/ACT nasal spray Place 2 sprays into both nostrils daily.   . hydrochlorothiazide (HYDRODIURIL) 12.5 MG tablet Take 1 tablet (12.5 mg total) by mouth daily.   . meclizine (ANTIVERT) 12.5 MG tablet Take 1 tablet (12.5 mg total) by mouth 3 (three) times daily as needed for dizziness.   . Multiple Vitamin (MULTIVITAMIN) tablet Take 1 tablet by mouth every other day.    . Omega-3 Fatty Acids (FISH OIL PO) Take by mouth.   Marland Kitchen OVER THE COUNTER MEDICATION Place 1 suppository rectally daily as needed (for hemmorhoids).   Vladimir Faster Glycol-Propyl Glycol (SYSTANE) 0.4-0.3 % SOLN Apply to eye.   . rosuvastatin (CRESTOR) 20 MG tablet Take 1 tablet (20 mg total) by mouth 3 (three) times a week.   . tetrahydrozoline (VISINE) 0.05 % ophthalmic solution Place 2 drops into both eyes daily as needed (for dry eyes).    No facility-administered encounter medications on file as of 03/07/2020.   Wt Readings from Last 3 Encounters:  07/26/19 143 lb (64.9 kg)  06/29/19 143 lb (64.9 kg)  05/16/19 139 lb (63 kg)    Current Diagnosis/Assessment:  Goals Addressed   None     Hypertension   BP goal < 130/80  Office blood pressures are  BP Readings from Last 3 Encounters:  07/26/19 108/69  06/29/19 122/72  05/16/19 110/64   Kidney Function Lab Results  Component Value Date/Time   CREATININE 0.85 03/30/2019 11:30 AM   CREATININE 0.78 05/16/2018 03:02 PM   GFR 79.22 03/30/2019 11:30 AM   GFRNONAA 90 (L) 11/21/2012 05:25 AM   GFRAA >90 11/21/2012 05:25 AM   K 3.4 (L) 03/30/2019 11:30 AM   K 3.4 (L) 05/16/2018 03:02 PM   Patient checks BP at home 3-5x per week  Patient home BP readings are ranging: "normal"  Patient has failed these meds in the past: n/a Patient is currently controlled on the  following medications:   HCTZ 12.5 mg daily  We discussed diet and exercise extensively, BP goals and assured patient she is currently at goal.   Plan  Continue current medications and control with diet and exercise   Hyperlipidemia   LDL goal < 100  Lipid Panel     Component Value Date/Time   CHOL 227 (H) 05/16/2018 1502   CHOL 272 (H) 05/22/2014 0956   TRIG 140.0 05/16/2018 1502   TRIG 111 05/22/2014 0956   HDL 58.80 05/16/2018 1502   HDL 67 05/22/2014 0956   CHOLHDL 4 05/16/2018 1502   VLDL 28.0 05/16/2018 1502   LDLCALC 140 (H) 05/16/2018 1502   LDLCALC 183 (H) 05/22/2014 0956   LDLDIRECT 175.3 04/24/2008 0848   Hepatic Function Latest Ref Rng & Units 03/30/2019 05/16/2018 05/14/2017  Total Protein 6.0 - 8.3 g/dL 7.2 7.3 7.0  Albumin 3.5 - 5.2 g/dL 4.3 4.5 4.2  AST 0 - 37 U/L $Remo'22 27 23  'Qhckx$ ALT 0 - 35 U/L $Remo'23 28 24  'TIAID$ Alk Phosphatase 39 - 117 U/L 55 55 54  Total Bilirubin 0.2 - 1.2 mg/dL 0.9 1.1 1.0  Bilirubin, Direct 0.0 - 0.3 mg/dL - - -     Ref. Range 01/22/2014 15:32 05/22/2014 09:56 08/27/2014 09:20  CK Total Latest Ref Range: 7.0 - 177.0 U/L 528 (H) 277 (H) 391 (H)   The 10-year ASCVD risk score Mikey Bussing DC Jr., et al., 2013) is: 12.9%   Values used to calculate the score:     Age: 24 years     Sex: Female     Is Non-Hispanic African American: Yes     Diabetic: No     Tobacco smoker: No     Systolic Blood Pressure: 962 mmHg     Is BP treated: Yes     HDL Cholesterol: 58.8 mg/dL     Total Cholesterol: 227 mg/dL  Patient has failed these meds in past: simvastatin Patient is currently uncontrolled on the following medications:   rosuvastatin 20 mg 3x per week,   OTC fish oil  We discussed:  diet and exercise extensively, discussed role of cholesterol in heart disease, mechanism of statins, benefits of statin outside of just lowering cholesterol. Discussed plate method and foods to avoid.  Pt reports she was previously taken off statins due to elevated  "enzymes", per chart CK was elevated on simvastatin. Last CK was 391 in 2016, prior to initiating rosuvastatin.   Plan  Continue current medications and control with diet and exercise  Recommend to check lipid panel and CK at next PCP visit to determine if statin adjustments are needed  GERD   Patient has failed these meds in past: rx esomeprazole (dizziness), pantoprazole (vivid dreams), omeprazole (dreams) Patient is currently controlled on the following medications:   OTC esomeprazole 20 mg daily  famotidine 20 mg BID,   Mylanta PRN  We discussed: pt  has not tolerated several other PPIs, but can tolerate OTC Nexium which costs ~$15 for 28 tablets. Discussed [employer]'s OTC med delivery program, pt agreed to try this to get Nexium for free. Also discussed optimal timing of famotidine and Mylanta to manage GERD symptoms.  Plan  Continue current medications  Get OTC Nexium through [place]   Patient has failed these meds in past: n/a Patient is currently controlled on the following medications:  Flonase PRN  Visine PRN  We discussed:  Pt uses Flonase infrequently, allergies are currently controlled.  Plan  Continue current medications   Postmenopausal symptoms   Patient has failed these meds in past: n/a Patient is currently controlled on the following medications:   estradiol 0.1 mg/gm vaginal crm TIW,   100% cranberry juice  We discussed:  Pt recently restarted Estrace cream, it is helping a little bit now. She was told to drink cranberry juice to help with urinary health, she has been using 100% cranberry juice that is very bitter. Recommended to try cranberry extract tablets, and avoid cranberry juice cocktail d/t high sugar content.  Plan  Continue current medications Replace cranberry juice with cranberry tablets  Health Maintenance   Patient is currently controlled on the following medications:   Tylenol PRN  meclizine 12.5 mg TID prn,    multivitamin  We discussed:  Pt is satisfied with OTC regimen, may use [employer] for OTC products.  Plan  Continue current medications   Medication Management   Pt uses [facility] for all medications Does not use pill box Pt endorses compliances with meds  We discussed:  Benefits of medication synchronization, packaging and delivery with [employer]. Pt elects to [facility] with [facility] for now.  Plan  Continue current med management strategy   Follow up:  6 month phone visit  Charlene Brooke, PharmD, [facility] Clinical Pharmacist [facility] [place] [phone]

## 2020-03-07 NOTE — Patient Instructions (Addendum)
Visit Information  Phone number for Pharmacist: [phone]  Goals Addressed            This Visit's Progress   . Pharmacy Care Plan       CARE PLAN ENTRY (see longitudinal plan of care for additional care plan information)  Current Barriers:  . Chronic Disease Management support, education, and care coordination needs related to Hypertension, Hyperlipidemia, and GERD   Hypertension BP Readings from Last 3 Encounters:  07/26/19 108/69  06/29/19 122/72  05/16/19 110/64 .  Pharmacist Clinical Goal(s): o Over the next 180 days, patient will work with PharmD and providers to maintain BP goal <130/80 . Current regimen:  o HCTZ 12.5 mg daily . Interventions: o Discussed BP goals and benefits of medications for prevention of heart attack / stroke . Patient self care activities - Over the next 180 days, patient will: o Check BP 2-3 times weekly, document, and provide at future appointments o Ensure daily salt intake < 2300 mg/day  Hyperlipidemia Lab Results  Component Value Date/Time   LDLCALC 140 (H) 05/16/2018 03:02 PM   LDLCALC 183 (H) 05/22/2014 09:56 AM   LDLDIRECT 175.3 04/24/2008 08:48 AM .  Pharmacist Clinical Goal(s): o Over the next 180 days, patient will work with PharmD and providers to achieve LDL goal < 130 . Current regimen:  o rosuvastatin 20 mg 3x per week o OTC fish oil . Interventions: o Discussed cholesterol goals and benefits of medications for prevention of heart attack / stroke . Patient self care activities - Over the next 180 days, patient will: o Continue medication as prescribed  GERD . Pharmacist Clinical Goal(s) o Over the next 180 days, patient will work with PharmD and providers to optimize therapy . Current regimen:  o OTC esomeprazole 20 mg daily o famotidine 20 mg twice a day o Mylanta PRN . Interventions: o Recommend using [employer] OTC benefit to obtain Nexium for free o Recommend taking famotidine 15-30 min prior to food that may  cause heartburn for best results . Patient self care activities - Over the next 180 days, patient will: o Continue medications as instructed  Medication management . Pharmacist Clinical Goal(s): o Over the next 180 days, patient will work with PharmD and providers to maintain optimal medication adherence . [facility] . Interventions o Comprehensive medication review performed. o Continue current medication management strategy . Patient self care activities - Over the next 180 days, patient will: o Focus on medication adherence by fill date o Take medications as prescribed o Report any questions or concerns to PharmD and/or provider(s)  Please see past updates related to this goal by clicking on the "Past Updates" button in the selected goal       Patient verbalizes understanding of instructions provided today.  Telephone follow up appointment with pharmacy team member scheduled for: 6 months  Charlene Brooke, PharmD, BCACP Clinical Pharmacist [facility] at [facility] [phone]  Health Maintenance, Female Adopting a healthy lifestyle and getting preventive care are important in promoting health and wellness. Ask your health care provider about:  The right schedule for you to have regular tests and exams.  Things you can do on your own to prevent diseases and keep yourself healthy. What should I know about diet, weight, and exercise? Eat a healthy diet   Eat a diet that includes plenty of vegetables, fruits, low-fat dairy products, and lean protein.  Do not eat a lot of foods that are high in solid fats, added  sugars, or sodium. Maintain a healthy weight Body mass index (BMI) is used to identify weight problems. It estimates body fat based on height and weight. Your health care provider can help determine your BMI and help you achieve or maintain a healthy weight. Get regular exercise Get regular exercise. This is one of the most important things  you can do for your health. Most adults should:  Exercise for at least 150 minutes each week. The exercise should increase your heart rate and make you sweat (moderate-intensity exercise).  Do strengthening exercises at least twice a week. This is in addition to the moderate-intensity exercise.  Spend less time sitting. Even light physical activity can be beneficial. Watch cholesterol and blood lipids Have your blood tested for lipids and cholesterol at 74 years of age, then have this test every 5 years. Have your cholesterol levels checked more often if:  Your lipid or cholesterol levels are high.  You are older than 74 years of age.  You are at high risk for heart disease. What should I know about cancer screening? Depending on your health history and family history, you may need to have cancer screening at various ages. This may include screening for:  Breast cancer.  Cervical cancer.  Colorectal cancer.  Skin cancer.  Lung cancer. What should I know about heart disease, diabetes, and high blood pressure? Blood pressure and heart disease  High blood pressure causes heart disease and increases the risk of stroke. This is more likely to develop in people who have high blood pressure readings, are of African descent, or are overweight.  Have your blood pressure checked: ? Every 3-5 years if you are 10-18 years of age. ? Every year if you are 59 years old or older. Diabetes Have regular diabetes screenings. This checks your fasting blood sugar level. Have the screening done:  Once every three years after age 101 if you are at a normal weight and have a low risk for diabetes.  More often and at a younger age if you are overweight or have a high risk for diabetes. What should I know about preventing infection? Hepatitis B If you have a higher risk for hepatitis B, you should be screened for this virus. Talk with your health care provider to find out if you are at risk for  hepatitis B infection. Hepatitis C Testing is recommended for:  Everyone born from [birth year] through 1965.  Anyone with known risk factors for hepatitis C. Sexually transmitted infections (STIs)  Get screened for STIs, including gonorrhea and chlamydia, if: ? You are sexually active and are younger than 74 years of age. ? You are older than 74 years of age and your health care provider tells you that you are at risk for this type of infection. ? Your sexual activity has changed since you were last screened, and you are at increased risk for chlamydia or gonorrhea. Ask your health care provider if you are at risk.  Ask your health care provider about whether you are at high risk for HIV. Your health care provider may recommend a prescription medicine to help prevent HIV infection. If you choose to take medicine to prevent HIV, you should first get tested for HIV. You should then be tested every 3 months for as long as you are taking the medicine. Pregnancy  If you are about to stop having your period (premenopausal) and you may become pregnant, seek counseling before you get pregnant.  Take 400 to 800 micrograms (  mcg) of folic acid every day if you become pregnant.  Ask for birth control (contraception) if you want to prevent pregnancy. Osteoporosis and menopause Osteoporosis is a disease in which the bones lose minerals and strength with aging. This can result in bone fractures. If you are 71 years old or older, or if you are at risk for osteoporosis and fractures, ask your health care provider if you should:  Be screened for bone loss.  Take a calcium or vitamin D supplement to lower your risk of fractures.  Be given hormone replacement therapy (HRT) to treat symptoms of menopause. Follow these instructions at home: Lifestyle  Do not use any products that contain nicotine or tobacco, such as cigarettes, e-cigarettes, and chewing tobacco. If you need help quitting, ask your health care  provider.  Do not use street drugs.  Do not share needles.  Ask your health care provider for help if you need support or information about quitting drugs. Alcohol use  Do not drink alcohol if: ? Your health care provider tells you not to drink. ? You are pregnant, may be pregnant, or are planning to become pregnant.  If you drink alcohol: ? Limit how much you use to 0-1 drink a day. ? Limit intake if you are breastfeeding.  Be aware of how much alcohol is in your drink. In the [place] one drink equals one 12 oz bottle of beer (355 mL), one 5 oz glass of wine (148 mL), or one 1 oz glass of hard liquor (44 mL). General instructions  Schedule regular health, dental, and eye exams.  Stay current with your vaccines.  Tell your health care provider if: ? You often feel depressed. ? You have ever been abused or do not feel safe at home. Summary  Adopting a healthy lifestyle and getting preventive care are important in promoting health and wellness.  Follow your health care provider's instructions about healthy diet, exercising, and getting tested or screened for diseases.  Follow your health care provider's instructions on monitoring your cholesterol and blood pressure. This information is not intended to replace advice given to you by your health care provider. Make sure you discuss any questions you have with your health care provider. Document Revised: 06/08/2018 Document Reviewed: 06/08/2018 Elsevier Patient Education  2020 [employer].

## 2020-04-27 ENCOUNTER — Ambulatory Visit: Payer: Medicare HMO | Attending: Internal Medicine

## 2020-04-27 ENCOUNTER — Other Ambulatory Visit: Payer: Self-pay

## 2020-04-27 DIAGNOSIS — Z23 Encounter for immunization: Secondary | ICD-10-CM

## 2020-04-27 NOTE — Progress Notes (Signed)
   Covid-19 Vaccination Clinic  Name:  INES REBEL    MRN: [id number] DOB: 26-Mar-1946  04/27/2020  Ms. Razzano was observed post Covid-19 immunization for 15 minutes without incident. She was provided with Vaccine Information Sheet and instruction to access the V-Safe system.   Ms. Leming was instructed to call 911 with any severe reactions post vaccine: Marland Kitchen Difficulty breathing  . Swelling of face and throat  . A fast heartbeat  . A bad rash all over body  . Dizziness and weakness

## 2020-04-28 ENCOUNTER — Other Ambulatory Visit: Payer: Self-pay | Admitting: Internal Medicine

## 2020-05-04 ENCOUNTER — Other Ambulatory Visit: Payer: Self-pay | Admitting: Internal Medicine

## 2020-05-07 ENCOUNTER — Encounter: Payer: Self-pay | Admitting: Radiology

## 2020-05-10 ENCOUNTER — Ambulatory Visit (INDEPENDENT_AMBULATORY_CARE_PROVIDER_SITE_OTHER): Payer: Medicare HMO | Admitting: Internal Medicine

## 2020-05-10 ENCOUNTER — Ambulatory Visit (INDEPENDENT_AMBULATORY_CARE_PROVIDER_SITE_OTHER): Payer: Medicare HMO

## 2020-05-10 ENCOUNTER — Encounter: Payer: Self-pay | Admitting: Internal Medicine

## 2020-05-10 ENCOUNTER — Other Ambulatory Visit: Payer: Self-pay

## 2020-05-10 VITALS — BP 124/70 | HR 70 | Temp 98.2°F | Ht 60.0 in | Wt 142.2 lb

## 2020-05-10 VITALS — BP 124/70 | HR 70 | Temp 98.2°F | Wt 142.2 lb

## 2020-05-10 DIAGNOSIS — Z0000 Encounter for general adult medical examination without abnormal findings: Secondary | ICD-10-CM | POA: Diagnosis not present

## 2020-05-10 DIAGNOSIS — I10 Essential (primary) hypertension: Secondary | ICD-10-CM | POA: Diagnosis not present

## 2020-05-10 DIAGNOSIS — E782 Mixed hyperlipidemia: Secondary | ICD-10-CM

## 2020-05-10 LAB — LIPID PANEL
Cholesterol: 203 mg/dL — ABNORMAL HIGH (ref 0–200)
HDL: 57 mg/dL (ref >39.00)
LDL Cholesterol: 130 mg/dL — ABNORMAL HIGH (ref 0–99)
NonHDL: 146.3
Total CHOL/HDL Ratio: 4
Triglycerides: 82 mg/dL (ref 0.0–149.0)
VLDL: 16.4 mg/dL (ref 0.0–40.0)

## 2020-05-10 LAB — COMPREHENSIVE METABOLIC PANEL
ALT: 22 U/L (ref 0–35)
AST: 24 U/L (ref 0–37)
Albumin: 4.2 g/dL (ref 3.5–5.2)
Alkaline Phosphatase: 56 U/L (ref 39–117)
BUN: 19 mg/dL (ref 6–23)
CO2: 35 mEq/L — ABNORMAL HIGH (ref 19–32)
Calcium: 9.7 mg/dL (ref 8.4–10.5)
Chloride: 100 mEq/L (ref 96–112)
Creatinine, Ser: 0.81 mg/dL (ref 0.40–1.20)
GFR: 71.42 mL/min (ref >60.00)
Glucose, Bld: 98 mg/dL (ref 70–99)
Potassium: 3.4 mEq/L — ABNORMAL LOW (ref 3.5–5.1)
Sodium: 141 mEq/L (ref 135–145)
Total Bilirubin: 1.2 mg/dL (ref 0.2–1.2)
Total Protein: 6.9 g/dL (ref 6.0–8.3)

## 2020-05-10 LAB — URINALYSIS, ROUTINE W REFLEX MICROSCOPIC
Bilirubin Urine: NEGATIVE
Ketones, ur: NEGATIVE
Leukocytes,Ua: NEGATIVE
Nitrite: NEGATIVE
Specific Gravity, Urine: 1.01 (ref 1.000–1.030)
Total Protein, Urine: NEGATIVE
Urine Glucose: NEGATIVE
Urobilinogen, UA: 0.2 (ref 0.0–1.0)
pH: 7.5 (ref 5.0–8.0)

## 2020-05-10 LAB — CBC
HCT: 40 % (ref 36.0–46.0)
Hemoglobin: 13.3 g/dL (ref 12.0–15.0)
MCHC: 33.3 g/dL (ref 30.0–36.0)
MCV: 89.3 fl (ref 78.0–100.0)
Platelets: 288 10*3/uL (ref 150.0–400.0)
RBC: 4.48 Mil/uL (ref 3.87–5.11)
RDW: 13.5 % (ref 11.5–15.5)
WBC: 6.3 10*3/uL (ref 4.0–10.5)

## 2020-05-10 NOTE — Patient Instructions (Signed)
Health Maintenance, Female Adopting a healthy lifestyle and getting preventive care are important in promoting health and wellness. Ask your health care provider about:  The right schedule for you to have regular tests and exams.  Things you can do on your own to prevent diseases and keep yourself healthy. What should I know about diet, weight, and exercise? Eat a healthy diet   Eat a diet that includes plenty of vegetables, fruits, low-fat dairy products, and lean protein.  Do not eat a lot of foods that are high in solid fats, added sugars, or sodium. Maintain a healthy weight Body mass index (BMI) is used to identify weight problems. It estimates body fat based on height and weight. Your health care provider can help determine your BMI and help you achieve or maintain a healthy weight. Get regular exercise Get regular exercise. This is one of the most important things you can do for your health. Most adults should:  Exercise for at least 150 minutes each week. The exercise should increase your heart rate and make you sweat (moderate-intensity exercise).  Do strengthening exercises at least twice a week. This is in addition to the moderate-intensity exercise.  Spend less time sitting. Even light physical activity can be beneficial. Watch cholesterol and blood lipids Have your blood tested for lipids and cholesterol at 74 years of age, then have this test every 5 years. Have your cholesterol levels checked more often if:  Your lipid or cholesterol levels are high.  You are older than 74 years of age.  You are at high risk for heart disease. What should I know about cancer screening? Depending on your health history and family history, you may need to have cancer screening at various ages. This may include screening for:  Breast cancer.  Cervical cancer.  Colorectal cancer.  Skin cancer.  Lung cancer. What should I know about heart disease, diabetes, and high blood  pressure? Blood pressure and heart disease  High blood pressure causes heart disease and increases the risk of stroke. This is more likely to develop in people who have high blood pressure readings, are of African descent, or are overweight.  Have your blood pressure checked: ? Every 3-5 years if you are 18-39 years of age. ? Every year if you are 40 years old or older. Diabetes Have regular diabetes screenings. This checks your fasting blood sugar level. Have the screening done:  Once every three years after age 40 if you are at a normal weight and have a low risk for diabetes.  More often and at a younger age if you are overweight or have a high risk for diabetes. What should I know about preventing infection? Hepatitis B If you have a higher risk for hepatitis B, you should be screened for this virus. Talk with your health care provider to find out if you are at risk for hepatitis B infection. Hepatitis C Testing is recommended for:  Everyone born from 1945 through 1965.  Anyone with known risk factors for hepatitis C. Sexually transmitted infections (STIs)  Get screened for STIs, including gonorrhea and chlamydia, if: ? You are sexually active and are younger than 74 years of age. ? You are older than 74 years of age and your health care provider tells you that you are at risk for this type of infection. ? Your sexual activity has changed since you were last screened, and you are at increased risk for chlamydia or gonorrhea. Ask your health care provider if   you are at risk.  Ask your health care provider about whether you are at high risk for HIV. Your health care provider may recommend a prescription medicine to help prevent HIV infection. If you choose to take medicine to prevent HIV, you should first get tested for HIV. You should then be tested every 3 months for as long as you are taking the medicine. Pregnancy  If you are about to stop having your period (premenopausal) and  you may become pregnant, seek counseling before you get pregnant.  Take 400 to 800 micrograms (mcg) of folic acid every day if you become pregnant.  Ask for birth control (contraception) if you want to prevent pregnancy. Osteoporosis and menopause Osteoporosis is a disease in which the bones lose minerals and strength with aging. This can result in bone fractures. If you are 65 years old or older, or if you are at risk for osteoporosis and fractures, ask your health care provider if you should:  Be screened for bone loss.  Take a calcium or vitamin D supplement to lower your risk of fractures.  Be given hormone replacement therapy (HRT) to treat symptoms of menopause. Follow these instructions at home: Lifestyle  Do not use any products that contain nicotine or tobacco, such as cigarettes, e-cigarettes, and chewing tobacco. If you need help quitting, ask your health care provider.  Do not use street drugs.  Do not share needles.  Ask your health care provider for help if you need support or information about quitting drugs. Alcohol use  Do not drink alcohol if: ? Your health care provider tells you not to drink. ? You are pregnant, may be pregnant, or are planning to become pregnant.  If you drink alcohol: ? Limit how much you use to 0-1 drink a day. ? Limit intake if you are breastfeeding.  Be aware of how much alcohol is in your drink. In the [place] one drink equals one 12 oz bottle of beer (355 mL), one 5 oz glass of wine (148 mL), or one 1 oz glass of hard liquor (44 mL). General instructions  Schedule regular health, dental, and eye exams.  Stay current with your vaccines.  Tell your health care provider if: ? You often feel depressed. ? You have ever been abused or do not feel safe at home. Summary  Adopting a healthy lifestyle and getting preventive care are important in promoting health and wellness.  Follow your health care provider's instructions about healthy  diet, exercising, and getting tested or screened for diseases.  Follow your health care provider's instructions on monitoring your cholesterol and blood pressure. This information is not intended to replace advice given to you by your health care provider. Make sure you discuss any questions you have with your health care provider. Document Revised: 06/08/2018 Document Reviewed: 06/08/2018 Elsevier Patient Education  2020 [employer]  

## 2020-05-10 NOTE — Patient Instructions (Signed)
[name] , Thank you for taking time to come for your Medicare Wellness Visit. I appreciate your ongoing commitment to your health goals. Please review the following plan we discussed and let me know if I can assist you in the future.   Screening recommendations/referrals: Colonoscopy: 03/14/2012; due every 10 years Mammogram: 08/04/2019 Bone Density: 07/23/2015; due every 3 years Recommended yearly ophthalmology/optometry visit for glaucoma screening and checkup Recommended yearly dental visit for hygiene and checkup  Vaccinations: Influenza vaccine: 03/29/2020 Pneumococcal vaccine: up to date Tdap vaccine: 11/19/2010; due every 10 years (due 2022) Shingles vaccine: never done Covid-19: up to date with booster  Advanced directives: Advance directive discussed with you today. Even though you declined this today please call our office should you change your mind and we can give you the proper paperwork for you to fill out.  Conditions/risks identified: Yes; Reviewed health maintenance screenings with patient today and relevant education, vaccines, and/or referrals were provided. Please continue to do your personal lifestyle choices by: daily care of teeth and gums, regular physical activity (goal should be 5 days a week for 30 minutes), eat a healthy diet, avoid tobacco and drug use, limiting any alcohol intake, taking a low-dose aspirin (if not allergic or have been advised by your provider otherwise) and taking vitamins and minerals as recommended by your provider. Continue doing brain stimulating activities (puzzles, reading, adult coloring books, staying active) to keep memory sharp. Continue to eat heart healthy diet (full of fruits, vegetables, whole grains, lean protein, water--limit salt, fat, and sugar intake) and increase physical activity as tolerated.  Next appointment: Please schedule your next Medicare Wellness Visit with your Nurse Health Advisor in 1 year by calling  [phone].  Preventive Care 100 Years and Older, Female Preventive care refers to lifestyle choices and visits with your health care provider that can promote health and wellness. What does preventive care include?  A yearly physical exam. This is also called an annual well check.  Dental exams once or twice a year.  Routine eye exams. Ask your health care provider how often you should have your eyes checked.  Personal lifestyle choices, including:  Daily care of your teeth and gums.  Regular physical activity.  Eating a healthy diet.  Avoiding tobacco and drug use.  Limiting alcohol use.  Practicing safe sex.  Taking low-dose aspirin every day.  Taking vitamin and mineral supplements as recommended by your health care provider. What happens during an annual well check? The services and screenings done by your health care provider during your annual well check will depend on your age, overall health, lifestyle risk factors, and family history of disease. Counseling  Your health care provider may ask you questions about your:  Alcohol use.  Tobacco use.  Drug use.  Emotional well-being.  Home and relationship well-being.  Sexual activity.  Eating habits.  History of falls.  Memory and ability to understand (cognition).  Work and work Statistician.  Reproductive health. Screening  You may have the following tests or measurements:  Height, weight, and BMI.  Blood pressure.  Lipid and cholesterol levels. These may be checked every 5 years, or more frequently if you are over 68 years old.  Skin check.  Lung cancer screening. You may have this screening every year starting at age 41 if you have a 30-pack-year history of smoking and currently smoke or have quit within the past 15 years.  Fecal occult blood test (FOBT) of the stool. You may have this  test every year starting at age 76.  Flexible sigmoidoscopy or colonoscopy. You may have a sigmoidoscopy  every 5 years or a colonoscopy every 10 years starting at age 76.  Hepatitis C blood test.  Hepatitis B blood test.  Sexually transmitted disease (STD) testing.  Diabetes screening. This is done by checking your blood sugar (glucose) after you have not eaten for a while (fasting). You may have this done every 1-3 years.  Bone density scan. This is done to screen for osteoporosis. You may have this done starting at age 59.  Mammogram. This may be done every 1-2 years. Talk to your health care provider about how often you should have regular mammograms. Talk with your health care provider about your test results, treatment options, and if necessary, the need for more tests. Vaccines  Your health care provider may recommend certain vaccines, such as:  Influenza vaccine. This is recommended every year.  Tetanus, diphtheria, and acellular pertussis (Tdap, Td) vaccine. You may need a Td booster every 10 years.  Zoster vaccine. You may need this after age 47.  Pneumococcal 13-valent conjugate (PCV13) vaccine. One dose is recommended after age 40.  Pneumococcal polysaccharide (PPSV23) vaccine. One dose is recommended after age 22. Talk to your health care provider about which screenings and vaccines you need and how often you need them. This information is not intended to replace advice given to you by your health care provider. Make sure you discuss any questions you have with your health care provider. Document Released: 07/12/2015 Document Revised: 03/04/2016 Document Reviewed: 04/16/2015 Elsevier Interactive Patient Education  2017 [place] Prevention in the Home Falls can cause injuries. They can happen to people of all ages. There are many things you can do to make your home safe and to help prevent falls. What can I do on the outside of my home?  Regularly fix the edges of walkways and driveways and fix any cracks.  Remove anything that might make you trip as you walk  through a door, such as a raised step or threshold.  Trim any bushes or trees on the path to your home.  Use bright outdoor lighting.  Clear any walking paths of anything that might make someone trip, such as rocks or tools.  Regularly check to see if handrails are loose or broken. Make sure that both sides of any steps have handrails.  Any raised decks and porches should have guardrails on the edges.  Have any leaves, snow, or ice cleared regularly.  Use sand or salt on walking paths during winter.  Clean up any spills in your garage right away. This includes oil or grease spills. What can I do in the bathroom?  Use night lights.  Install grab bars by the toilet and in the tub and shower. Do not use towel bars as grab bars.  Use non-skid mats or decals in the tub or shower.  If you need to sit down in the shower, use a plastic, non-slip stool.  Keep the floor dry. Clean up any water that spills on the floor as soon as it happens.  Remove soap buildup in the tub or shower regularly.  Attach bath mats securely with double-sided non-slip rug tape.  Do not have throw rugs and other things on the floor that can make you trip. What can I do in the bedroom?  Use night lights.  Make sure that you have a light by your bed that is easy to reach.  Do not use any sheets or blankets that are too big for your bed. They should not hang down onto the floor.  Have a firm chair that has side arms. You can use this for support while you get dressed.  Do not have throw rugs and other things on the floor that can make you trip. What can I do in the kitchen?  Clean up any spills right away.  Avoid walking on wet floors.  Keep items that you use a lot in easy-to-reach places.  If you need to reach something above you, use a strong step stool that has a grab bar.  Keep electrical cords out of the way.  Do not use floor polish or wax that makes floors slippery. If you must use wax,  use non-skid floor wax.  Do not have throw rugs and other things on the floor that can make you trip. What can I do with my stairs?  Do not leave any items on the stairs.  Make sure that there are handrails on both sides of the stairs and use them. Fix handrails that are broken or loose. Make sure that handrails are as long as the stairways.  Check any carpeting to make sure that it is firmly attached to the stairs. Fix any carpet that is loose or worn.  Avoid having throw rugs at the top or bottom of the stairs. If you do have throw rugs, attach them to the floor with carpet tape.  Make sure that you have a light switch at the top of the stairs and the bottom of the stairs. If you do not have them, ask someone to add them for you. What else can I do to help prevent falls?  Wear shoes that:  Do not have high heels.  Have rubber bottoms.  Are comfortable and fit you well.  Are closed at the toe. Do not wear sandals.  If you use a stepladder:  Make sure that it is fully opened. Do not climb a closed stepladder.  Make sure that both sides of the stepladder are locked into place.  Ask someone to hold it for you, if possible.  Clearly mark and make sure that you can see:  Any grab bars or handrails.  First and last steps.  Where the edge of each step is.  Use tools that help you move around (mobility aids) if they are needed. These include:  Canes.  Walkers.  Scooters.  Crutches.  Turn on the lights when you go into a dark area. Replace any light bulbs as soon as they burn out.  Set up your furniture so you have a clear path. Avoid moving your furniture around.  If any of your floors are uneven, fix them.  If there are any pets around you, be aware of where they are.  Review your medicines with your doctor. Some medicines can make you feel dizzy. This can increase your chance of falling. Ask your doctor what other things that you can do to help prevent  falls. This information is not intended to replace advice given to you by your health care provider. Make sure you discuss any questions you have with your health care provider. Document Released: 04/11/2009 Document Revised: 11/21/2015 Document Reviewed: 07/20/2014 Elsevier Interactive Patient Education  2017 [employer].

## 2020-05-10 NOTE — Progress Notes (Signed)
Subjective:   [name] is a 74 y.o. female who presents for Medicare Annual (Subsequent) preventive examination.  Review of Systems    No ROS. Medicare Wellness Visit. Additional risk factors are reflected in social history. Sleep Patterns: No sleep issues, feels rested on waking and sleeps 8 hours nightly. Home Safety/Smoke Alarms: Feels safe in home; uses home alarm. Smoke alarms in place. Living environment: 2-story home; Lives with spouse; no needs for DME; good support system. Seat Belt Safety/Bike Helmet: Wears seat belt. Cardiac Risk Factors include: advanced age (>71men, >46 women);dyslipidemia;hypertension;family history of premature cardiovascular disease     Objective:    Today's Vitals   05/10/20 0951  BP: 124/70  Pulse: 70  Temp: 98.2 F (36.8 C)  SpO2: 97%  Weight: 142 lb 3.2 oz (64.5 kg)  Height: 5' (1.524 m)   Body mass index is 27.77 kg/m.  Advanced Directives 05/10/2020 07/26/2016 07/18/2015 11/19/2012 08/15/2012  Does Patient Have a Medical Advance Directive? No No Yes Patient does not have advance directive;Patient would not like information Patient does not have advance directive;Patient would not like information  Copy of [place] in Chart? - - Yes - -  Would patient like information on creating a medical advance directive? No - Patient declined No - Patient declined - - -  Pre-existing out of facility DNR order (yellow form or pink MOST form) - - - No No    Current Medications (verified) Outpatient Encounter Medications as of 05/10/2020  Medication Sig  . acetaminophen (TYLENOL) 500 MG tablet Take 500-750 mg by mouth every 6 (six) hours as needed for pain.  Marland Kitchen alum & mag hydroxide-simeth (MAALOX/MYLANTA) 200-200-20 MG/5ML suspension Take 15 mLs by mouth as needed for indigestion or heartburn.  . esomeprazole (NEXIUM) 40 MG capsule Take 1 capsule (40 mg total) by mouth daily. (Patient taking differently: Take 20 mg by mouth  daily. )  . estradiol (ESTRACE) 0.1 MG/GM vaginal cream Place 2 g vaginally 3 (three) times a week.  . famotidine (PEPCID) 20 MG tablet Take 1 tablet (20 mg total) by mouth 2 (two) times daily.  . fluticasone (FLONASE) 50 MCG/ACT nasal spray Place 2 sprays into both nostrils daily.  . hydrochlorothiazide (HYDRODIURIL) 12.5 MG tablet Take 1 tablet (12.5 mg total) by mouth daily.  . meclizine (ANTIVERT) 12.5 MG tablet Take 1 tablet (12.5 mg total) by mouth 3 (three) times daily as needed for dizziness.  . Multiple Vitamin (MULTIVITAMIN) tablet Take 1 tablet by mouth every other day.   . Omega-3 Fatty Acids (FISH OIL PO) Take by mouth.  Marland Kitchen OVER THE COUNTER MEDICATION Place 1 suppository rectally daily as needed (for hemmorhoids).  Vladimir Faster Glycol-Propyl Glycol (SYSTANE) 0.4-0.3 % SOLN Apply to eye.  . rosuvastatin (CRESTOR) 20 MG tablet TAKE 1 TABLET (20 MG TOTAL) BY MOUTH 3 (THREE) TIMES A WEEK.  Marland Kitchen tetrahydrozoline (VISINE) 0.05 % ophthalmic solution Place 2 drops into both eyes daily as needed (for dry eyes).  . [DISCONTINUED] FLUAD QUADRIVALENT 0.5 ML injection    No facility-administered encounter medications on file as of 05/10/2020.    Allergies (verified) Zocor [simvastatin], Advil [ibuprofen], Bran [wheat bran], Hydrocodone, Hydrocodone-acetaminophen, Lipitor [atorvastatin], and Pravachol [pravastatin sodium]   History: Past Medical History:  Diagnosis Date  . Arthritis    KNEES  . CTS (carpal tunnel syndrome)    BILATERAL LEFT > RIGHT (OCC. HAND NUMBNESS)  . Female cystocele   . GERD (gastroesophageal reflux disease)   . H/O  hiatal hernia   . History of cervical dysplasia    CIN II  . History of peritonitis    2006--  S/P PERFORATED BOWEL INJURY DURING SURGERY  . Hyperlipidemia    Past Surgical History:  Procedure Laterality Date  . ABDOMINAL HYSTERECTOMY  1991   W/ BILATERAL SALPINGOOPHORECTOMY  . BREAST BIOPSY    . LAPAROSCOPIC CHOLECYSTECTOMY  2006   W/ PERFERATED  BOWEL (INJURY)  . NASAL SEPTUM SURGERY  70's   REPAIR DEVIATED SEPTUM  . TUBAL LIGATION  1980'S  . VAGINAL PROLAPSE REPAIR N/A 08/15/2012   Procedure: VAGINAL VAULT SUSPENSION;  Surgeon: Ailene Rud, MD;  Location: [facility];  Service: Urology;  Laterality: N/A;  ANTERIOR VAULT REPAIR WITH XENFORE   Family History  Problem Relation Age of Onset  . Coronary artery disease Mother   . Other Mother        cva  . Hypertension Mother   . Heart disease Mother   . Other Father        CVA  . Stroke Father   . Cancer Sister 53       breast  . Breast cancer Sister   . Cancer Brother        prancreatic  . Cancer Sister 12       breast  . Breast cancer Sister   . Diabetes Other   . Coronary artery disease Other   . Hypertension Other   . Stroke Other   . Cancer Other        breast  . Colon cancer Neg Hx    Social History   Socioeconomic History  . Marital status: Married    Spouse name: Not on file  . Number of children: Not on file  . Years of education: Not on file  . Highest education level: Not on file  Occupational History  . Not on file  Tobacco Use  . Smoking status: Never Smoker  . Smokeless tobacco: Never Used  Vaping Use  . Vaping Use: Never used  Substance and Sexual Activity  . Alcohol use: No    Alcohol/week: 0.0 standard drinks    Comment: no  . Drug use: No  . Sexual activity: Not Currently    Birth control/protection: Surgical  Other Topics Concern  . Not on file  Social History Narrative   Married '68. 2 son - '69, '70; 1 son - '71: 2 grandchildren. 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   Social Determinants of Health   Financial Resource Strain:   . Difficulty of Paying Living Expenses: Not on file  Food Insecurity:   . Worried About Charity fundraiser in the Last Year: Not on file  . Ran Out of Food in the Last Year: Not on file  Transportation Needs: No Transportation Needs  . Lack of Transportation  (Medical): No  . Lack of Transportation (Non-Medical): No  Physical Activity: Inactive  . Days of Exercise per Week: 0 days  . Minutes of Exercise per Session: 0 min  Stress:   . Feeling of Stress : Not on file  Social Connections: Socially Integrated  . Frequency of Communication with Friends and Family: More than three times a week  . Frequency of Social Gatherings with Friends and Family: Once a week  . Attends Religious Services: 1 to 4 times per year  . Active Member of Clubs or Organizations: Yes  . Attends Archivist Meetings: More than 4 times per year  .  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 no  Interpreter Needed?: No  Information entered by :: Lisette Abu, LPN   Activities of Daily Living In your present state of health, do you have any difficulty performing the following activities: 05/10/2020  Hearing? N  Vision? N  Difficulty concentrating or making decisions? N  Walking or climbing stairs? N  Dressing or bathing? N  Doing errands, shopping? N  Preparing Food and eating ? N  Using the Toilet? N  In the past six months, have you accidently leaked urine? N  Do you have problems with loss of bowel control? N  Managing your Medications? N  Managing your Finances? N  Housekeeping or managing your Housekeeping? N  Some recent data might be hidden    Patient Care Team: Hoyt Koch, MD as PCP - General (Internal Medicine) Charlton Haws, [facility] as Pharmacist  (Pharmacist)  Indicate any recent Medical Services you may have received from other than [facility] providers in the past year (date may be approximate).     Assessment:   This is a routine wellness examination for Anael.  Hearing/Vision screen No exam data present  Dietary issues and exercise activities discussed: Current Exercise Habits: The patient does not participate in regular exercise at present, Exercise limited by: None identified  Goals    .  patient (pt-stated)      To have peace; Celebrate the good family times; continue to develop spiritually      .  Pharmacy Care Plan      CARE PLAN ENTRY (see longitudinal plan of care for additional care plan information)  Current Barriers:  . Chronic Disease Management support, education, and care coordination needs related to Hypertension, Hyperlipidemia, and GERD   Hypertension BP Readings from Last 3 Encounters:  07/26/19 108/69  06/29/19 122/72  05/16/19 110/64 .  Pharmacist Clinical Goal(s): o Over the next 180 days, patient will work with PharmD and providers to maintain BP goal <130/80 . Current regimen:  o HCTZ 12.5 mg daily . Interventions: o Discussed BP goals and benefits of medications for prevention of heart attack / stroke . Patient self care activities - Over the next 180 days, patient will: o Check BP 2-3 times weekly, document, and provide at future appointments o Ensure daily salt intake < 2300 mg/day  Hyperlipidemia Lab Results  Component Value Date/Time   LDLCALC 140 (H) 05/16/2018 03:02 PM   LDLCALC 183 (H) 05/22/2014 09:56 AM   LDLDIRECT 175.3 04/24/2008 08:48 AM .  Pharmacist Clinical Goal(s): o Over the next 180 days, patient will work with PharmD and providers to achieve LDL goal < 130 . Current regimen:  o rosuvastatin 20 mg 3x per week o OTC fish oil . Interventions: o Discussed cholesterol goals and benefits of medications for prevention of heart attack / stroke . Patient self care activities  - Over the next 180 days, patient will: o Continue medication as prescribed  GERD . Pharmacist Clinical Goal(s) o Over the next 180 days, patient will  work with PharmD and providers to optimize therapy . Current regimen:  o OTC esomeprazole 20 mg daily o famotidine 20 mg twice a day o Mylanta PRN . Interventions: o Recommend using [employer] OTC benefit to obtain Nexium for free o Recommend taking famotidine 15-30 min prior to food that may cause heartburn for best results . Patient self care activities - Over the next 180 days, patient will: o Continue medications as instructed  Medication management . Pharmacist Clinical Goal(s): o Over the next 180 days, patient will work with PharmD and providers to maintain optimal medication adherence . [facility] . Interventions o Comprehensive medication review performed. o Continue current medication management strategy . Patient self care activities - Over the next 180 days, patient will: o Focus on medication adherence by fill date o Take medications as prescribed o Report any questions or concerns to PharmD and/or provider(s)  Please see past updates related to this goal by clicking on the "Past Updates" button in the selected goal       Depression Screen PHQ 2/9 Scores 05/10/2020 03/30/2019 05/16/2018 09/30/2016 07/18/2015 10/03/2014  PHQ - 2 Score 0 0 0 0 0 0    Fall Risk Fall Risk  05/10/2020 03/30/2019 11/30/2017 10/29/2017 09/30/2016  Falls in the past year? 0 0 No No No  Number falls in past yr: 0 - - - -  Injury with Fall? 0 - - - -  Risk for fall due to : No Fall Risks - - - -  Follow up Falls evaluation completed - - - -    Any stairs in or around the home? Yes  If so, are there any without handrails? No  Home free of loose throw rugs in walkways, pet beds, electrical cords, etc? Yes  Adequate lighting in your home to reduce risk of falls? Yes   ASSISTIVE DEVICES UTILIZED TO PREVENT FALLS:  Life alert? No  Use of a  cane, walker or w/c? No  Grab bars in the bathroom? No  Shower chair or bench in shower? No  Elevated toilet seat or a handicapped toilet? Yes   TIMED UP AND GO:  Was the test performed? No .  Length of time to ambulate 10 feet: 0 sec.   Gait steady and fast without use of assistive device  Cognitive Function:     6CIT Screen 05/10/2020  What Year? 0 points  What month? 0 points  What time? 0 points  Count back from 20 0 points  Months in reverse 0 points  Repeat phrase 0 points  Total Score 0    Immunizations Immunization History  Administered Date(s) Administered  . Fluad Quad(high Dose 65+) 03/30/2019, 03/29/2020  . Influenza Split 04/23/2011  . Influenza Whole 05/02/2012  . Influenza, High Dose Seasonal PF 02/26/2016, 05/14/2017, 04/12/2018  . Influenza,inj,Quad PF,6+ Mos 05/10/2013, 06/08/2014, 05/03/2015  . PFIZER SARS-COV-2 Vaccination 08/11/2019, 09/02/2019, 04/27/2020  . Pneumococcal Conjugate-13 05/03/2015  . Pneumococcal Polysaccharide-23 11/19/2010  . Tdap 11/19/2010    TDAP status: Up to date Flu Vaccine status: Up to date Pneumococcal vaccine status: Up to date Covid-19 vaccine status: Completed vaccines  Qualifies for Shingles Vaccine? Yes   Zostavax completed Yes   Shingrix Completed?: No.    Education has been provided regarding the importance of this vaccine. Patient has been advised to call insurance company to determine out of pocket expense if they have not yet received this vaccine. Advised may also receive vaccine at local pharmacy or Health Dept. Verbalized acceptance  and understanding.  Screening Tests Health Maintenance  Topic Date Due  . TETANUS/TDAP  11/18/2020  . MAMMOGRAM  07/25/2021  . COLONOSCOPY  03/14/2022  . INFLUENZA VACCINE  Completed  . DEXA SCAN  Completed  . COVID-19 Vaccine  Completed  . Hepatitis C Screening  Completed  . PNA vac Low Risk Adult  Completed    Health Maintenance  There are no preventive care  reminders to display for this patient.  Colorectal cancer screening: Completed 03/14/2012. Repeat every 10 years Mammogram status: Completed 08/04/2019. Repeat every year Bone Density status: Completed 07/23/2015. Results reflect: Bone density results: OSTEOPENIA. Repeat every 3 years.  Lung Cancer Screening: (Low Dose CT Chest recommended if Age 16-80 years, 30 pack-year currently smoking OR have quit w/in 15years.) does not qualify.   Lung Cancer Screening Referral: no  Additional Screening:  Hepatitis C Screening: does qualify; Completed yes  Vision Screening: Recommended annual ophthalmology exams for early detection of glaucoma and other disorders of the eye. Is the patient up to date with their annual eye exam?  Yes  Who is the provider or what is the name of the office in which the patient attends annual eye exams? Katy Apo, MD If pt is not established with a provider, would they like to be referred to a provider to establish care? No .   Dental Screening: Recommended annual dental exams for proper oral hygiene  Community Resource Referral / Chronic Care Management: CRR required this visit?  No   CCM required this visit?  No      Plan:     I have personally reviewed and noted the following in the patient's chart:   . Medical and social history . Use of alcohol, tobacco or illicit drugs  . Current medications and supplements . Functional ability and status . Nutritional status . Physical activity . Advanced directives . List of other physicians . Hospitalizations, surgeries, and ER visits in previous 12 months . Vitals . Screenings to include cognitive, depression, and falls . Referrals and appointments  In addition, I have reviewed and discussed with patient certain preventive protocols, quality metrics, and best practice recommendations. A written personalized care plan for preventive services as well as general preventive health recommendations were provided to  patient.     Sheral Flow, LPN   [id number]   Nurse Notes: n/a

## 2020-05-10 NOTE — Assessment & Plan Note (Signed)
Flu shot up to date. Covid-19 up to date. Pneumonia complete. Shingrix counseled to get. Tetanus due 2022. Colonoscopy due 2023. Mammogram due 2023, pap smear aged out and dexa declines. Counseled about sun safety and mole surveillance. Counseled about the dangers of distracted driving. Given 10 year screening recommendations.

## 2020-05-10 NOTE — Assessment & Plan Note (Signed)
BP at goal on her hctz, checking CMP and adjust as needed.

## 2020-05-10 NOTE — Progress Notes (Signed)
   Subjective:   Patient ID: [name], female    DOB: Oct 15, 1945, 74 y.o.   MRN: [id number]  HPI The patient is a 74 YO female coming in for physical.   PMH, [place], social history reviewed and updated  Review of Systems  Constitutional: Negative.   HENT: Negative.   Eyes: Negative.   Respiratory: Negative for cough, chest tightness and shortness of breath.   Cardiovascular: Negative for chest pain, palpitations and leg swelling.  Gastrointestinal: Negative for abdominal distention, abdominal pain, constipation, diarrhea, nausea and vomiting.  Musculoskeletal: Negative.   Skin: Negative.   Neurological: Negative.   Psychiatric/Behavioral: Negative.     Objective:  Physical Exam Constitutional:      Appearance: She is well-developed.  HENT:     Head: Normocephalic and atraumatic.  Cardiovascular:     Rate and Rhythm: Normal rate and regular rhythm.  Pulmonary:     Effort: Pulmonary effort is normal. No respiratory distress.     Breath sounds: Normal breath sounds. No wheezing or rales.  Abdominal:     General: Bowel sounds are normal. There is no distension.     Palpations: Abdomen is soft.     Tenderness: There is no abdominal tenderness. There is no rebound.  Musculoskeletal:     Cervical back: Normal range of motion.  Skin:    General: Skin is warm and dry.  Neurological:     Mental Status: She is alert and oriented to person, place, and time.     Coordination: Coordination normal.     Vitals:   05/10/20 0927  BP: 124/70  Pulse: 70  Temp: 98.2 F (36.8 C)  TempSrc: Oral  SpO2: 97%  Weight: 142 lb 3.2 oz (64.5 kg)   EKG: Rate 73, axis normal, intervals normal, sinus, no st or t wave changes, no significant change since last 2018  This visit occurred during the SARS-CoV-2 public health emergency.  Safety protocols were in place, including screening questions prior to the visit, additional usage of staff PPE, and extensive cleaning of exam room while  observing appropriate contact time as indicated for disinfecting solutions.   Assessment & Plan:

## 2020-05-10 NOTE — Assessment & Plan Note (Signed)
Checking lipid panel and adjust crestor as needed.

## 2020-05-13 ENCOUNTER — Telehealth: Payer: Self-pay | Admitting: Internal Medicine

## 2020-05-13 NOTE — Telephone Encounter (Signed)
hydrochlorothiazide (HYDRODIURIL) 12.5 MG tablet [id number] [place], [place] Phone:  [phone]  Fax:  [phone]     Patient requesting a refill, out of medication

## 2020-05-15 ENCOUNTER — Other Ambulatory Visit: Payer: Self-pay

## 2020-05-15 ENCOUNTER — Telehealth: Payer: Self-pay | Admitting: Pharmacist

## 2020-05-15 MED ORDER — HYDROCHLOROTHIAZIDE 12.5 MG PO TABS
12.5000 mg | ORAL_TABLET | Freq: Every day | ORAL | 3 refills | Status: DC
Start: 2020-05-15 — End: 2021-04-07

## 2020-05-15 NOTE — Progress Notes (Signed)
    Chronic Care Management Pharmacy Assistant   Name: MADAI NUCCIO  MRN: [id number] DOB: Oct 05, 1945  Reason for Encounter: Chart review   PCP : Hoyt Koch, MD  Allergies:   Allergies  Allergen Reactions  . Zocor [Simvastatin]     12/2013 CK 528  . Advil [Ibuprofen] Swelling    Tongue swells  . Bran [Wheat Bran] Other (See Comments)    Very bad GI pain  . Hydrocodone Nausea Only and Other (See Comments)     TACHYCARDIA  . Hydrocodone-Acetaminophen Nausea Only    Other reaction(s): Increased Heart Rate (intolerance)  . Lipitor [Atorvastatin] Other (See Comments)    Bad dreams  . Pravachol [Pravastatin Sodium] Other (See Comments)    Bad dreams    Medications: Outpatient Encounter Medications as of 05/15/2020  Medication Sig Note  . acetaminophen (TYLENOL) 500 MG tablet Take 500-750 mg by mouth every 6 (six) hours as needed for pain.   Marland Kitchen alum & mag hydroxide-simeth (MAALOX/MYLANTA) 200-200-20 MG/5ML suspension Take 15 mLs by mouth as needed for indigestion or heartburn.   . esomeprazole (NEXIUM) 40 MG capsule Take 1 capsule (40 mg total) by mouth daily. (Patient taking differently: Take 20 mg by mouth daily. ) 08/31/2019: Using OTC Nexium due tolerability issues with Rx version  . estradiol (ESTRACE) 0.1 MG/GM vaginal cream Place 2 g vaginally 3 (three) times a week.   . famotidine (PEPCID) 20 MG tablet Take 1 tablet (20 mg total) by mouth 2 (two) times daily.   . fluticasone (FLONASE) 50 MCG/ACT nasal spray Place 2 sprays into both nostrils daily.   . hydrochlorothiazide (HYDRODIURIL) 12.5 MG tablet Take 1 tablet (12.5 mg total) by mouth daily.   . meclizine (ANTIVERT) 12.5 MG tablet Take 1 tablet (12.5 mg total) by mouth 3 (three) times daily as needed for dizziness.   . Multiple Vitamin (MULTIVITAMIN) tablet Take 1 tablet by mouth every other day.    . Omega-3 Fatty Acids (FISH OIL PO) Take by mouth.   Marland Kitchen OVER THE COUNTER MEDICATION Place 1 suppository rectally daily  as needed (for hemmorhoids).   Vladimir Faster Glycol-Propyl Glycol (SYSTANE) 0.4-0.3 % SOLN Apply to eye.   . rosuvastatin (CRESTOR) 20 MG tablet TAKE 1 TABLET (20 MG TOTAL) BY MOUTH 3 (THREE) TIMES A WEEK.   Marland Kitchen tetrahydrozoline (VISINE) 0.05 % ophthalmic solution Place 2 drops into both eyes daily as needed (for dry eyes).    No facility-administered encounter medications on file as of 05/15/2020.    Current Diagnosis: Patient Active Problem List   Diagnosis Date Noted  . Arthritis of left acromioclavicular joint 04/04/2019  . Right hip pain 03/30/2019  . Rotator cuff arthropathy of left shoulder 03/24/2017  . Essential hypertension 10/03/2014  . Impingement syndrome of left shoulder region 10/03/2014  . Routine general medical examination at a health care facility 08/27/2014  . Elevated CK 02/02/2014  . Impaired fasting glucose 02/17/2012  . GERD 03/17/2009  . Hyperlipidemia 02/11/2009  . Left knee pain 02/11/2009    Goals Addressed   None     Follow-Up:  Pharmacist Review    Reviewed patients chart to schedule the patient for a general adherence call for December to check on GERD and which PPI helps the patient.    Rosendo Gros, [facility]  Practice Team Manager/ CPA (Clinical Pharmacist Assistant) [phone]

## 2020-06-03 ENCOUNTER — Telehealth: Payer: Self-pay | Admitting: Pharmacist

## 2020-06-05 NOTE — Progress Notes (Signed)
Chronic Care Management Pharmacy Assistant   Name: [name]  MRN: [id number] DOB: September 27, 1945  Reason for Encounter: General Adherence Call   PCP : Hoyt Koch, MD  Allergies:   Allergies  Allergen Reactions  . Zocor [Simvastatin]     12/2013 CK 528  . Advil [Ibuprofen] Swelling    Tongue swells  . Bran [Wheat Bran] Other (See Comments)    Very bad GI pain  . Hydrocodone Nausea Only and Other (See Comments)     TACHYCARDIA  . Hydrocodone-Acetaminophen Nausea Only    Other reaction(s): Increased Heart Rate (intolerance)  . Lipitor [Atorvastatin] Other (See Comments)    Bad dreams  . Pravachol [Pravastatin Sodium] Other (See Comments)    Bad dreams    Medications: Outpatient Encounter Medications as of 06/03/2020  Medication Sig Note  . acetaminophen (TYLENOL) 500 MG tablet Take 500-750 mg by mouth every 6 (six) hours as needed for pain.   Marland Kitchen alum & mag hydroxide-simeth (MAALOX/MYLANTA) 200-200-20 MG/5ML suspension Take 15 mLs by mouth as needed for indigestion or heartburn.   . esomeprazole (NEXIUM) 40 MG capsule Take 1 capsule (40 mg total) by mouth daily. (Patient taking differently: Take 20 mg by mouth daily. ) 08/31/2019: Using OTC Nexium due tolerability issues with Rx version  . estradiol (ESTRACE) 0.1 MG/GM vaginal cream Place 2 g vaginally 3 (three) times a week.   . famotidine (PEPCID) 20 MG tablet Take 1 tablet (20 mg total) by mouth 2 (two) times daily.   . fluticasone (FLONASE) 50 MCG/ACT nasal spray Place 2 sprays into both nostrils daily.   . hydrochlorothiazide (HYDRODIURIL) 12.5 MG tablet Take 1 tablet (12.5 mg total) by mouth daily.   . meclizine (ANTIVERT) 12.5 MG tablet Take 1 tablet (12.5 mg total) by mouth 3 (three) times daily as needed for dizziness.   . Multiple Vitamin (MULTIVITAMIN) tablet Take 1 tablet by mouth every other day.    . Omega-3 Fatty Acids (FISH OIL PO) Take by mouth.   Marland Kitchen OVER THE COUNTER MEDICATION Place 1 suppository  rectally daily as needed (for hemmorhoids).   Vladimir Faster Glycol-Propyl Glycol (SYSTANE) 0.4-0.3 % SOLN Apply to eye.   . rosuvastatin (CRESTOR) 20 MG tablet TAKE 1 TABLET (20 MG TOTAL) BY MOUTH 3 (THREE) TIMES A WEEK.   Marland Kitchen tetrahydrozoline (VISINE) 0.05 % ophthalmic solution Place 2 drops into both eyes daily as needed (for dry eyes).    No facility-administered encounter medications on file as of 06/03/2020.    Current Diagnosis: Patient Active Problem List   Diagnosis Date Noted  . Arthritis of left acromioclavicular joint 04/04/2019  . Right hip pain 03/30/2019  . Rotator cuff arthropathy of left shoulder 03/24/2017  . Essential hypertension 10/03/2014  . Impingement syndrome of left shoulder region 10/03/2014  . Routine general medical examination at a health care facility 08/27/2014  . Elevated CK 02/02/2014  . Impaired fasting glucose 02/17/2012  . GERD 03/17/2009  . Hyperlipidemia 02/11/2009  . Left knee pain 02/11/2009    Goals Addressed   None     Follow-Up:  Pharmacist Review   A general adherence call was made to [name] for a wellness call to follow up to see how she has been doing since her last visit with the clinical pharmacist Mendel Ryder. The patient states that she is doing well except for some pain in her left shoulder which she thinks is arthritis. She did states that she is taking famotidine and esomeprazole for GERD.  The patient did state that she believes she is allergic to the esomeprazole and thinks she can only take Nexium. She also mentioned that she would like to talk with a Gastroenterologists to have a colonoscopy done, because of some bleeding from rectum, which may be her hemorrhoids. She states that she has a little discomfort when going to the bathroom and would like to set up an appointment to see a urologist. Lastly the patient states that she needs a new prescription for her Hydrochlorothiazide patient states that she only has about 3 or 4 pills left.  She states that the pharmacy has been trying to get in touch with the office to get a refill but has not been successful in doing so , so the pharmacist at [facility] gave her a 30 supply. I let the patient know that I will pass along this information to the clinical pharmacist Mendel Ryder.    Wendy Poet, Clinical Pharmacist Assistant [facility]

## 2020-06-06 ENCOUNTER — Telehealth: Payer: Self-pay | Admitting: Internal Medicine

## 2020-06-06 ENCOUNTER — Other Ambulatory Visit: Payer: Self-pay | Admitting: Internal Medicine

## 2020-06-06 NOTE — Telephone Encounter (Signed)
Returned patients call to notify her of the last note left by Dr. Sharlet Salina.

## 2020-06-06 NOTE — Telephone Encounter (Signed)
If they were both wearing masks this likely does not represent a true exposure. If she develops any symptoms should get covid-19 testing.

## 2020-06-06 NOTE — Telephone Encounter (Signed)
Patient called and said she was exposed to Covid 19 this morning. She said she and the other person had masks on. The person had a rapid test and it came back positive. She was wondering what she needed to do next. She said she does not have any symptoms.  Please call her at [phone]

## 2020-06-17 IMAGING — MG DIGITAL SCREENING BILAT W/ TOMO W/ CAD
8 series · 9 of 24 positions shown · non-contrast
Comparison: Previous exam(s).

CLINICAL DATA: Screening.

EXAM:
DIGITAL SCREENING BILATERAL MAMMOGRAM WITH TOMO AND CAD

[R CC synth-2D]
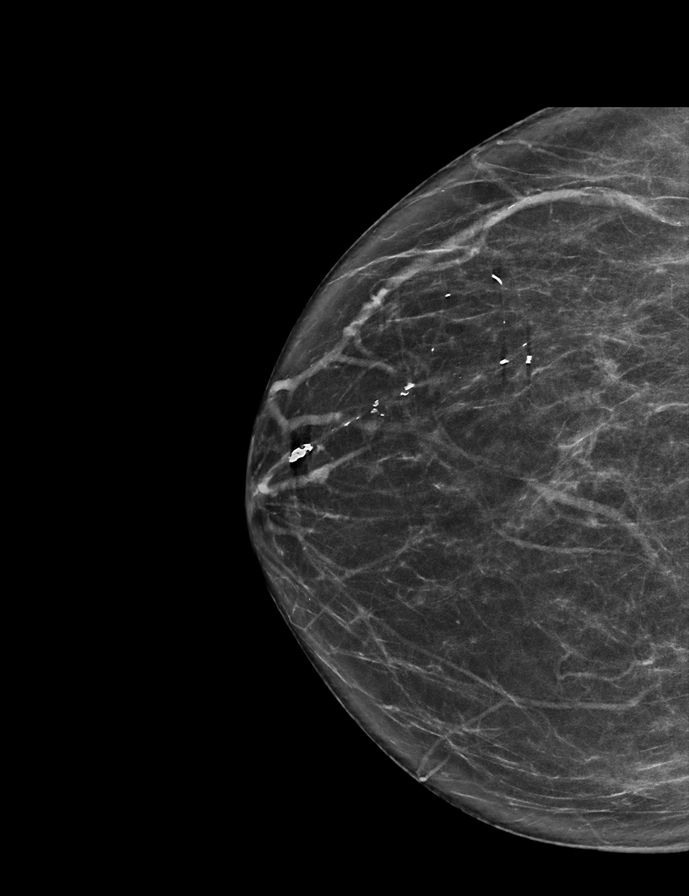

[L CC synth-2D]
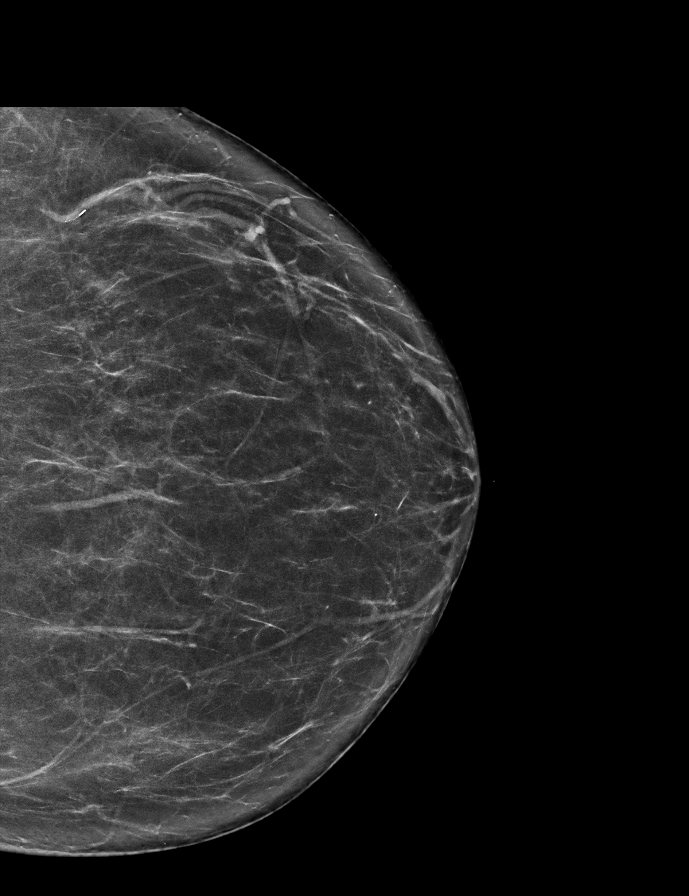

[R MLO synth-2D]
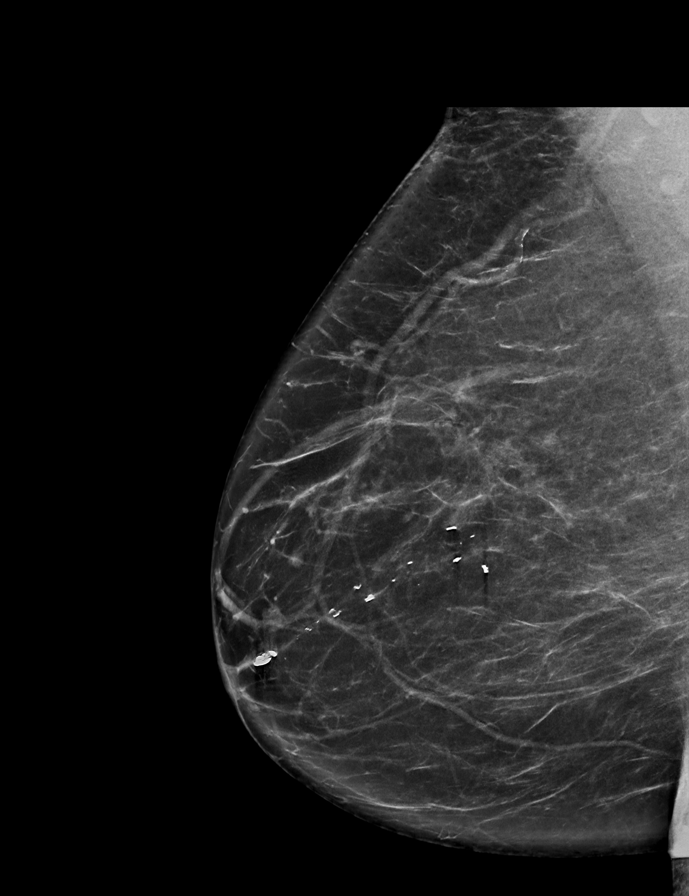

[L MLO synth-2D]
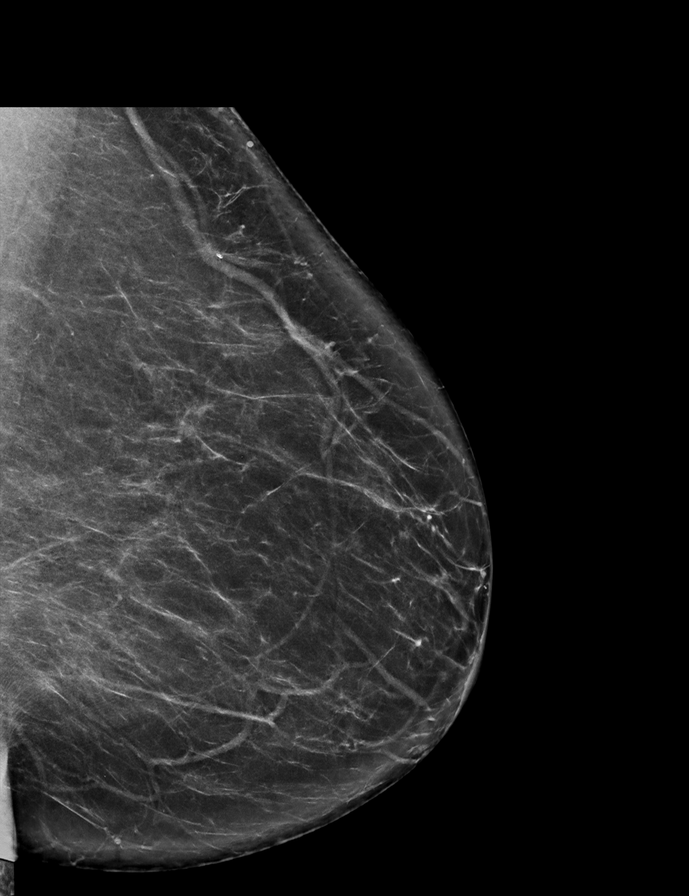

[L MLO tomo · 2 of 80 frames shown]
[frame 26/80]
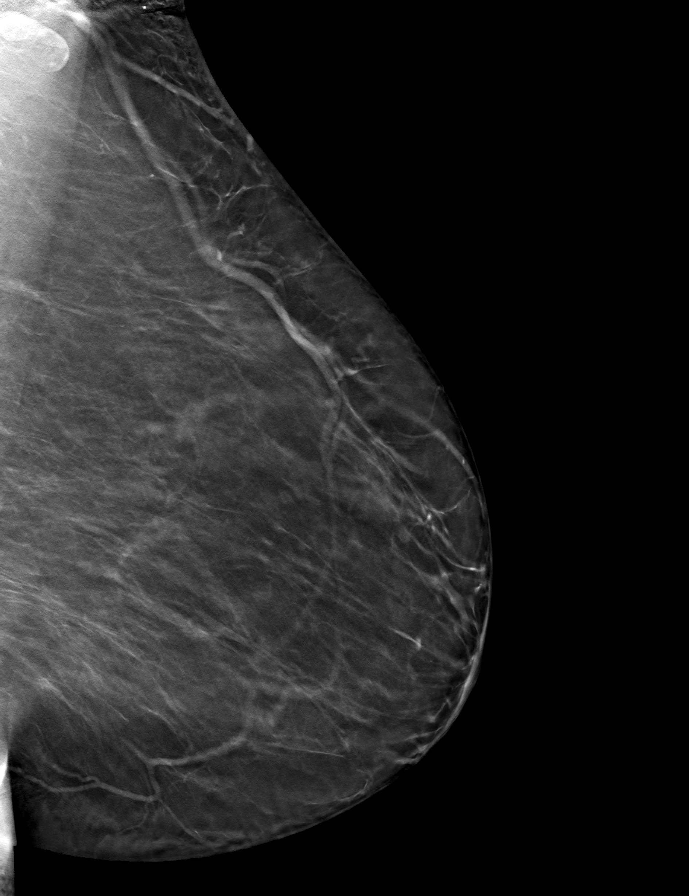
[frame 41/80]
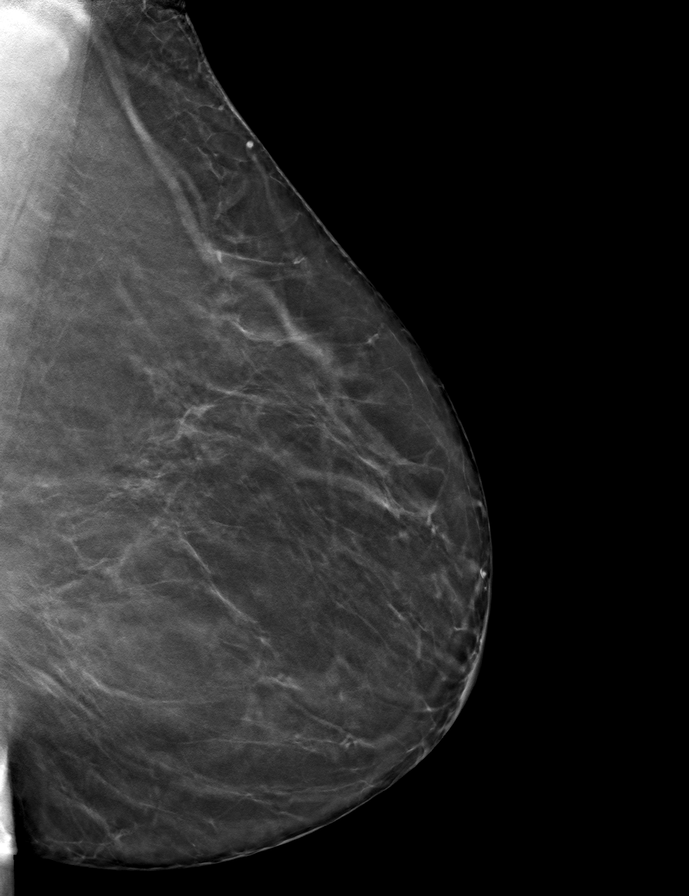

[R CC tomo · tomo slice 33/64.0]
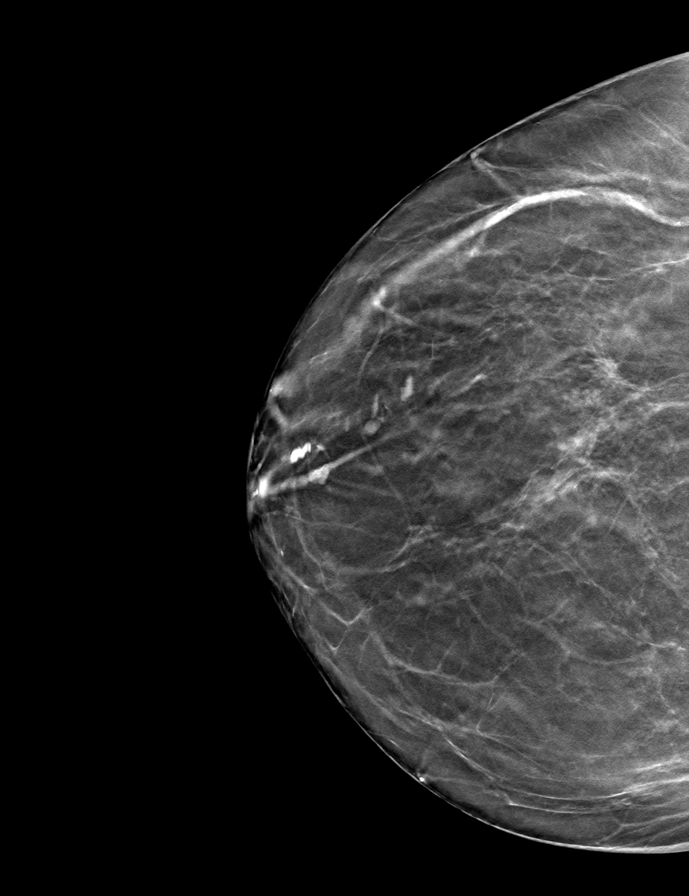

[R MLO tomo · tomo slice 41/81.0]
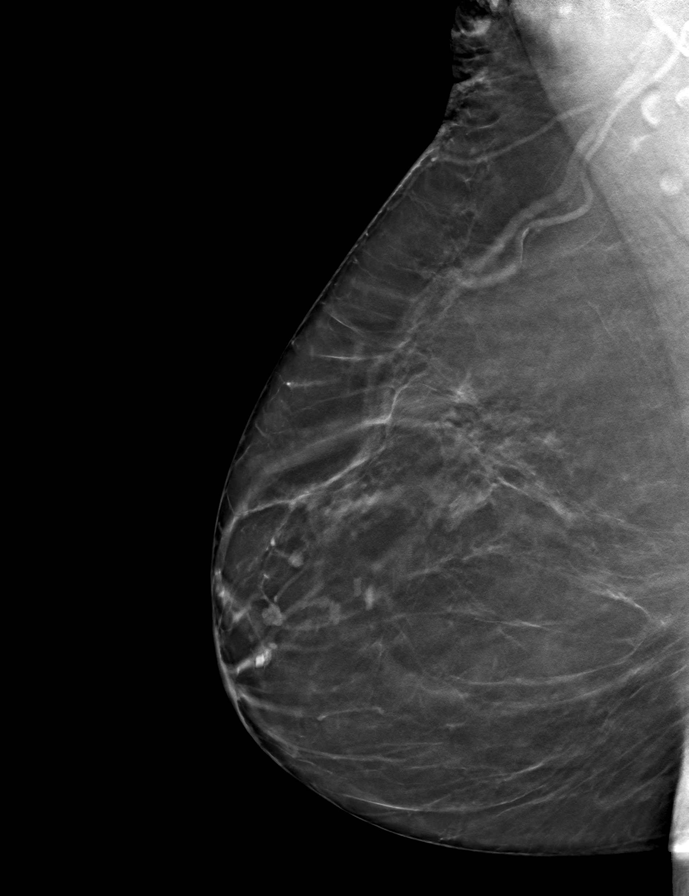

[L CC tomo · tomo slice 35/68.0]
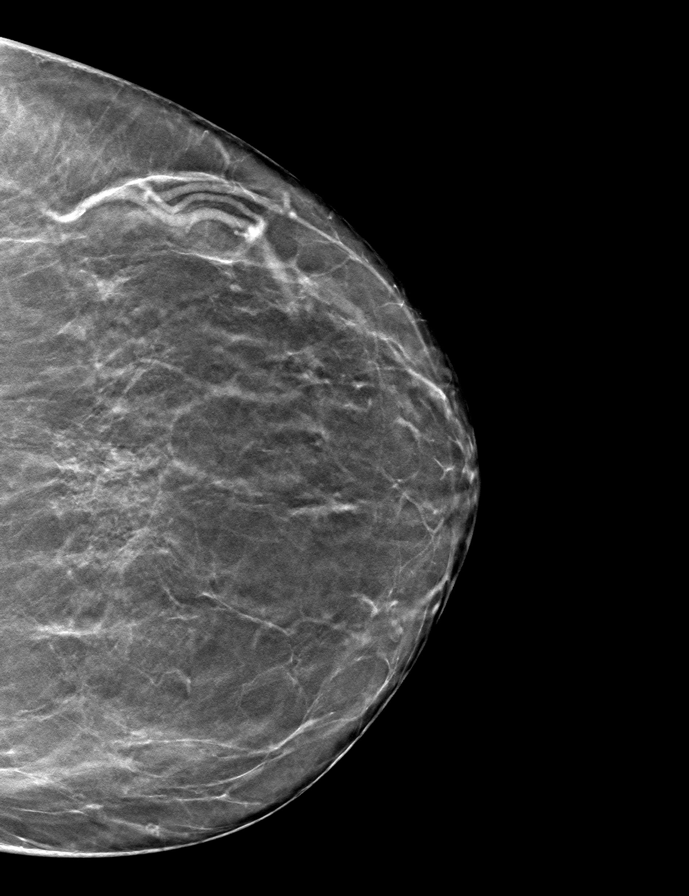

[9 of 24 positions shown; findings below may reference images not displayed]

ACR Breast Density Category b: There are scattered areas of
fibroglandular density.
FINDINGS: In the right breast, possible masses in the upper retroareolar
region (MLO tomogram image 42) warrant further evaluation. In
addition, an asymmetry (right cc tomogram image 34) within the
upper-outer posterior right breast warrants further evaluation. In
the left breast, no findings suspicious for malignancy. Images were
processed with CAD.
IMPRESSION: Further evaluation is suggested for possible masses and asymmetry in
the right breast.

RECOMMENDATION:
Diagnostic mammogram and possibly ultrasound of the right breast.
(Code:[id number])

The patient will be contacted regarding the findings, and additional
imaging will be scheduled.

BI-RADS CATEGORY  0: Incomplete. Need additional imaging evaluation
and/or prior mammograms for comparison.

## 2020-07-09 ENCOUNTER — Other Ambulatory Visit: Payer: Self-pay | Admitting: Internal Medicine

## 2020-07-09 DIAGNOSIS — Z1231 Encounter for screening mammogram for malignant neoplasm of breast: Secondary | ICD-10-CM

## 2020-07-11 ENCOUNTER — Other Ambulatory Visit: Payer: Medicare HMO

## 2020-07-11 DIAGNOSIS — Z20822 Contact with and (suspected) exposure to covid-19: Secondary | ICD-10-CM

## 2020-07-13 ENCOUNTER — Telehealth (INDEPENDENT_AMBULATORY_CARE_PROVIDER_SITE_OTHER): Payer: Medicare HMO | Admitting: Family Medicine

## 2020-07-13 ENCOUNTER — Other Ambulatory Visit: Payer: Self-pay

## 2020-07-13 ENCOUNTER — Encounter: Payer: Self-pay | Admitting: Family Medicine

## 2020-07-13 DIAGNOSIS — U071 COVID-19: Secondary | ICD-10-CM

## 2020-07-13 LAB — NOVEL CORONAVIRUS, NAA: SARS-CoV-2, NAA: DETECTED — AB

## 2020-07-13 LAB — SARS-COV-2, NAA 2 DAY TAT

## 2020-07-13 MED ORDER — BENZONATATE 100 MG PO CAPS: 200.0000 mg | ORAL_CAPSULE | Freq: Three times a day (TID) | ORAL | 0 refills | Status: DC | PRN

## 2020-07-13 MED ORDER — PREDNISONE 20 MG PO TABS: ORAL_TABLET | ORAL | 0 refills | Status: DC

## 2020-07-13 NOTE — Progress Notes (Signed)
Jaquelyn Sakamoto T. Rickayla Wieland, MD Primary Care and Sports Medicine [facility] at [facility] [place], [address] Phone: [phone]  FAX: [place] - 74 y.o. female  MRN [id number]  Date of Birth: 07-29-1945  Visit Date: 07/13/2020  PCP: Hoyt Koch, MD  Referred by: Hoyt Koch, *  Virtual Visit via Video Note:  I connected with  [name] on 07/13/2020 10:20 AM EST by a video enabled telemedicine application and verified that I am speaking with the correct person using two identifiers.   Location patient: home computer, tablet, or smartphone Location provider: work or home office Consent: Verbal consent directly obtained from [employer]. Persons participating in the virtual visit: patient, provider  I discussed the limitations of evaluation and management by telemedicine and the availability of in person appointments. The patient expressed understanding and agreed to proceed.  Chief Complaint  Patient presents with  . Covid    Symptoms started 07-09-20. Her test just came back positive. Husband tested positive 07-08-20. He is in the hospital with Covid. She has a cough, runny nose, sore throat. She has been taking Sudafed and an OTC cough syrup.    History of Present Illness:  Had a st started on Tuesday and couhing, sneezing. No temp.  Feels really stopped up in her nose.  Feels a little better today. Coughed a lot last night.  Her primary issues are cough, nasal congestion, sinus pressure.  She denies GI symptoms, neurological symptoms or changes in taste or smell.  She is having some minimal shortness of breath and fatigue with walking around.  She did have to take care of her husband who is COVID-positive.  Immunization History  Administered Date(s) Administered  . Fluad Quad(high Dose 65+) 03/30/2019, 03/29/2020  . Influenza Split 04/23/2011  . Influenza Whole 05/02/2012  .  Influenza, High Dose Seasonal PF 02/26/2016, 05/14/2017, 04/12/2018  . Influenza,inj,Quad PF,6+ Mos 05/10/2013, 06/08/2014, 05/03/2015  . PFIZER SARS-COV-2 Vaccination 08/11/2019, 09/02/2019, 04/27/2020  . Pneumococcal Conjugate-13 05/03/2015  . Pneumococcal Polysaccharide-23 11/19/2010  . Tdap 11/19/2010     Review of Systems as above: See pertinent positives and pertinent negatives per HPI No acute distress verbally   Observations/Objective/Exam:  An attempt was made to discern vital signs over the phone and per patient if applicable and possible.   General:    Alert, Oriented, appears well and in no acute distress  Pulmonary:     On inspection no signs of respiratory distress.  Psych / Neurological:     Pleasant and cooperative.  Assessment and Plan:    ICD-10-CM   1. [id number]-33  U07.1    Reviewed COVID-19.  Reviewed quarantine rules.  She understands.  Red flags reviewed.  Hopefully around some steroids will open her up, and Tessalon is probably reasonable for cough.  I discussed the assessment and treatment plan with the patient. The patient was provided an opportunity to ask questions and all were answered. The patient agreed with the plan and demonstrated an understanding of the instructions.   The patient was advised to call back or seek an in-person evaluation if the symptoms worsen or if the condition fails to improve as anticipated.  Follow-up: prn unless noted otherwise below No follow-ups on file.  Meds ordered this encounter  Medications  . predniSONE (DELTASONE) 20 MG tablet    Sig: 2 tabs po for 4 days, then 1 tab po for 4 days  Dispense:  12 tablet    Refill:  0  . benzonatate (TESSALON) 100 MG capsule    Sig: Take 2 capsules (200 mg total) by mouth 3 (three) times daily as needed for cough.    Dispense:  40 capsule    Refill:  0   No orders of the defined types were placed in this encounter.   Signed,  Maud Deed. Talha Iser, MD

## 2020-07-14 ENCOUNTER — Other Ambulatory Visit: Payer: Self-pay | Admitting: Internal Medicine

## 2020-07-15 ENCOUNTER — Telehealth: Payer: Self-pay | Admitting: Internal Medicine

## 2020-07-15 NOTE — Telephone Encounter (Signed)
For covid? No this is a virus so antibiotics do not help viruses.

## 2020-07-15 NOTE — Telephone Encounter (Signed)
Team Health Report/Call: [place] states she has recently tested for covid and her sore throat is coming back.  VV on 07/13/20 +COVID  Advised see PCP within 3 days if worsen. Home care given by triage nurse.

## 2020-07-15 NOTE — Telephone Encounter (Signed)
Fine to take otc for sore throat.

## 2020-07-15 NOTE — Telephone Encounter (Signed)
Pt informed of below.  

## 2020-07-15 NOTE — Telephone Encounter (Signed)
Pt informed of below.  Patient wants to know if she needs a abx?

## 2020-07-16 ENCOUNTER — Telehealth: Payer: Self-pay

## 2020-07-16 NOTE — Telephone Encounter (Signed)
[facility] Night - Client TELEPHONE ADVICE RECORD [facility] Patient Name: MADELIENE TEJERA Gender: Female DOB: [birth year] Age: 75 Y 23 M 35 D Return Phone Number: [phone] (Primary), [phone] (Secondary) [place] [address] Client [facility] Night - Client Client Site [place] Physician Owens Loffler - MD Contact Type Call Who Is Calling Patient / Member / Family / Caregiver Call Type Triage / Clinical Relationship To Patient Self Return Phone Number [phone] (Secondary) Chief Complaint Cough Reason for Call Symptomatic / Request for [place] is covid positive. Patient was prescribed medication by the doctor already, but Caller wants to know if an infusion would help. Patient has sneezing, sore throat, and cough. Translation No Nurse Assessment Nurse: Waymond Cera, RN, Benjamine Mola Date/Time (Eastern Time): 07/13/2020 12:02:06 PM Confirm and document reason for call. If symptomatic, describe symptoms. ---Caller states that she has covid. States that she having cough,sneezing, and sore throat. Did virtual visit this am. Is not worse than when she was seen. Does the patient have any new or worsening symptoms? ---Yes Will a triage be completed? ---Yes Related visit to physician within the last 2 weeks? ---Yes Does the PT have any chronic conditions? (i.e. diabetes, asthma, this includes High risk factors for pregnancy, etc.) ---Yes List chronic conditions. ---esophageal reflux Is this a behavioral health or substance abuse call? ---No Guidelines Guideline Title Affirmed Question Affirmed Notes Nurse Date/Time (Eastern Time) COVID-19 - Diagnosed or Suspected SEVERE or constant chest pain or pressure (Exception: mild central chest pain, present only when coughing) Cantrell, RN, Benjamine Mola 07/13/2020 12:04:02 PM Disp. Time Eilene Ghazi  Time) Disposition Final User 07/13/2020 12:07:49 PM Go to ED Now Yes Cantrell, RN, Janifer Adie NOTE: All timestamps contained within this report are represented as [place] Standard Time. CONFIDENTIALTY NOTICE: This fax transmission is intended only for the addressee. It contains information that is legally privileged, confidential or otherwise protected from use or disclosure. If you are not the intended recipient, you are strictly prohibited from reviewing, disclosing, copying using or disseminating any of this information or taking any action in reliance on or regarding this information. If you have received this fax in error, please notify us immediately by telephone so that we can arrange for its return to [place]. Phone: [phone], Toll-Free: [phone], Fax: [phone] Page: 2 of 2 Call Id: [id number] [place] Disagree/Comply Disagree Caller Understands Yes PreDisposition InappropriateToAsk Care Advice Given Per Guideline GO TO ED NOW: * You need to be seen in the Emergency Department. * Leave now. Drive carefully. YOU SHOULD TELL HEALTHCARE PERSONNEL THAT YOU MIGHT HAVE COVID-19: * Tell the first healthcare worker you meet that you may have COVID-19. WEAR A MASK - COVER YOUR MOUTH AND NOSE: * Wear a mask. ANOTHER ADULT SHOULD DRIVE: * It is better and safer if another adult drives instead of you. CALL EMS 911 IF: * Severe difficulty breathing occurs * Lips or face turns blue * Confusion occurs. CARE ADVICE given per COVID-19 - DIAGNOSED OR SUSPECTED (Adult) guideline. Comments User: Sharol Given, RN Date/Time Eilene Ghazi Time): 07/13/2020 12:03:31 PM States that Dr is sending in medications. User: Sharol Given, RN Date/Time Eilene Ghazi Time): 07/13/2020 12:12:20 PM Caller states that she had chest tightness of 8/10 yesterday. States that she will talk to her daughter. Advised that she could get worse/ possibly die. Advised if she could not get a ride, that she could call 911.  Verbalizes understanding. Caller states that she did  not tell Dr that this had happened when she had the video call this am. Advised caller that I did not have access to an on-call at this time to discuss infusion. Verbalizes understanding. Referrals GO TO FACILITY REFUSED

## 2020-07-16 NOTE — Telephone Encounter (Signed)
Spoke with the patient and she verbalized understanding why she would qualify for the infusion. She also stated that she is starting to feel better. No other questions or concerns at this time.

## 2020-07-16 NOTE — Telephone Encounter (Signed)
Since she is vaccinated she would likely not qualify for an infusion for covid.

## 2020-07-25 ENCOUNTER — Other Ambulatory Visit: Payer: Self-pay

## 2020-07-25 ENCOUNTER — Ambulatory Visit: Payer: Medicare HMO | Admitting: Pharmacist

## 2020-07-25 DIAGNOSIS — I10 Essential (primary) hypertension: Secondary | ICD-10-CM

## 2020-07-25 DIAGNOSIS — K2100 Gastro-esophageal reflux disease with esophagitis, without bleeding: Secondary | ICD-10-CM

## 2020-07-25 DIAGNOSIS — E782 Mixed hyperlipidemia: Secondary | ICD-10-CM

## 2020-07-25 NOTE — Patient Instructions (Signed)
Visit Information  Phone number for Pharmacist: [phone]  Goals Addressed            This Visit's Progress   . Pharmacy Care Plan   On track    CARE PLAN ENTRY (see longitudinal plan of care for additional care plan information)  Current Barriers:  . Chronic Disease Management support, education, and care coordination needs related to Hypertension, Hyperlipidemia, and GERD   Hypertension BP Readings from Last 3 Encounters:  05/10/20 124/70  05/10/20 124/70  07/26/19 108/69 .  Pharmacist Clinical Goal(s): o Over the next 180 days, patient will work with PharmD and providers to maintain BP goal <130/80 . Current regimen:  o HCTZ 12.5 mg daily . Interventions: o Discussed BP goals and benefits of medications for prevention of heart attack / stroke . Patient self care activities - Over the next 180 days, patient will: o Check BP 2-3 times weekly, document, and provide at future appointments o Ensure daily salt intake < 2300 mg/day  Hyperlipidemia Lab Results  Component Value Date/Time   LDLCALC 130 (H) 05/10/2020 10:28 AM   LDLCALC 183 (H) 05/22/2014 09:56 AM   LDLDIRECT 175.3 04/24/2008 08:48 AM .  Pharmacist Clinical Goal(s): o Over the next 180 days, patient will work with PharmD and providers to achieve LDL goal < 130 . Current regimen:  o rosuvastatin 20 mg 3x per week o OTC fish oil . Interventions: o Discussed cholesterol goals and benefits of medications for prevention of heart attack / stroke . Patient self care activities - Over the next 180 days, patient will: o Continue medication as prescribed  GERD . Pharmacist Clinical Goal(s) o Over the next 180 days, patient will work with PharmD and providers to optimize therapy . Current regimen:  o OTC esomeprazole 20 mg daily o famotidine 20 mg twice a day o Mylanta PRN . Interventions: o Recommend using [employer] OTC benefit to obtain Nexium for free o Recommend taking famotidine 15-30 min prior to food that  may cause heartburn for best results . Patient self care activities - Over the next 180 days, patient will: o Continue medications as instructed  Medication management . Pharmacist Clinical Goal(s): o Over the next 180 days, patient will work with PharmD and providers to maintain optimal medication adherence . Current pharmacy: [facility] . Interventions o Comprehensive medication review performed. o Continue current medication management strategy . Patient self care activities - Over the next 180 days, patient will: o Focus on medication adherence by fill date o Take medications as prescribed o Report any questions or concerns to PharmD and/or provider(s)  Please see past updates related to this goal by clicking on the "Past Updates" button in the selected goal       There are no care plans to display for this patient.  The patient verbalized understanding of instructions, educational materials, and care plan provided today and declined offer to receive copy of patient instructions, educational materials, and care plan.  Telephone follow up appointment with pharmacy team member scheduled for: 6 months  Charlene Brooke, PharmD, [facility] Clinical Pharmacist [facility] at [facility] [phone]

## 2020-07-25 NOTE — Chronic Care Management (AMB) (Signed)
Chronic Care Management Pharmacy  Name: [name]  MRN: [id number] DOB: 06/08/1946   Chief Complaint/ HPI  [name],  75 y.o. , female presents for their Follow-Up CCM visit with the clinical pharmacist via telephone due to COVID-19 Pandemic.  PCP : Hoyt Koch, MD  Their chronic conditions include: HTN, HLD, GERD, arthritis  Pt goes to [employer], helps with collection on Fridays. Not visiting much with family right now. Has a twin sister. Lives with 1 of her sons. Has 17 grandchildren.  07/25/20 - pt is recovering from COVID, positive test was 1/13. She had a course of prednisone and is feeling better now. She is planning to visit her husband who is hospitalized with COVID now.  Office Visits: 05/10/20 Dr Sharlet Salina OV: CPX, labs stable, no med changes.  04/21/19 Dr Sharlet Salina OV: c/o dizziness, possibly d/t Nexium which was started recently. Continue holding.  03/30/19 Dr Sharlet Salina OV: R hip pain - suspected bursitis, OTC creams and stretching.   Consult Visit: 07/26/19 Dr Ernestina Patches (OB/GYN): well woman exam, continue estrace cream for atrophic vaginitis.   05/16/19 Dr Tamala Julian (sports medicine): injection for rotator cuff arthropathy  Allergies  Allergen Reactions  . Zocor [Simvastatin]     12/2013 CK 528  . Advil [Ibuprofen] Swelling    Tongue swells  . Bran [Wheat Bran] Other (See Comments)    Very bad GI pain  . Hydrocodone Nausea Only and Other (See Comments)     TACHYCARDIA  . Hydrocodone-Acetaminophen Nausea Only    Other reaction(s): Increased Heart Rate (intolerance)  . Lipitor [Atorvastatin] Other (See Comments)    Bad dreams  . Pravachol [Pravastatin Sodium] Other (See Comments)    Bad dreams   Medications: Outpatient Encounter Medications as of 07/25/2020  Medication Sig Note  . acetaminophen (TYLENOL) 500 MG tablet Take 500-750 mg by mouth every 6 (six) hours as needed for pain.   Marland Kitchen alum & mag hydroxide-simeth (MAALOX/MYLANTA)  200-200-20 MG/5ML suspension Take 15 mLs by mouth as needed for indigestion or heartburn.   . benzonatate (TESSALON) 100 MG capsule Take 2 capsules (200 mg total) by mouth 3 (three) times daily as needed for cough.   . esomeprazole (NEXIUM) 40 MG capsule Take 1 capsule (40 mg total) by mouth daily. (Patient taking differently: Take 20 mg by mouth daily.) 08/31/2019: Using OTC Nexium due tolerability issues with Rx version  . estradiol (ESTRACE) 0.1 MG/GM vaginal cream Place 2 g vaginally 3 (three) times a week.   . famotidine (PEPCID) 20 MG tablet TAKE 1 TABLET BY MOUTH TWICE A DAY   . fluticasone (FLONASE) 50 MCG/ACT nasal spray Place 2 sprays into both nostrils daily.   . hydrochlorothiazide (HYDRODIURIL) 12.5 MG tablet Take 1 tablet (12.5 mg total) by mouth daily.   . Multiple Vitamin (MULTIVITAMIN) tablet Take 1 tablet by mouth every other day.    . Omega-3 Fatty Acids (FISH OIL PO) Take by mouth.   Marland Kitchen OVER THE COUNTER MEDICATION Place 1 suppository rectally daily as needed (for hemmorhoids).   Vladimir Faster Glycol-Propyl Glycol (SYSTANE) 0.4-0.3 % SOLN Apply to eye.   . predniSONE (DELTASONE) 20 MG tablet 2 tabs po for 4 days, then 1 tab po for 4 days   . rosuvastatin (CRESTOR) 20 MG tablet TAKE 1 TABLET (20 MG TOTAL) BY MOUTH 3 (THREE) TIMES A WEEK.   Marland Kitchen tetrahydrozoline 0.05 % ophthalmic solution Place 2 drops into both eyes daily as needed (for dry eyes).  No facility-administered encounter medications on file as of 07/25/2020.   Wt Readings from Last 3 Encounters:  05/10/20 142 lb 3.2 oz (64.5 kg)  05/10/20 142 lb 3.2 oz (64.5 kg)  07/26/19 143 lb (64.9 kg)   Lab Results  Component Value Date   CREATININE 0.81 05/10/2020   BUN 19 05/10/2020   GFR 71.42 05/10/2020   GFRNONAA 90 (L) 11/21/2012   GFRAA >90 11/21/2012   NA 141 05/10/2020   K 3.4 (L) 05/10/2020   CALCIUM 9.7 05/10/2020   CO2 35 (H) 05/10/2020   Current Diagnosis/Assessment:  Goals Addressed            This  Visit's Progress   . Pharmacy Care Plan   On track    CARE PLAN ENTRY (see longitudinal plan of care for additional care plan information)  Current Barriers:  . Chronic Disease Management support, education, and care coordination needs related to Hypertension, Hyperlipidemia, and GERD   Hypertension BP Readings from Last 3 Encounters:  05/10/20 124/70  05/10/20 124/70  07/26/19 108/69 .  Pharmacist Clinical Goal(s): o Over the next 180 days, patient will work with PharmD and providers to maintain BP goal <130/80 . Current regimen:  o HCTZ 12.5 mg daily . Interventions: o Discussed BP goals and benefits of medications for prevention of heart attack / stroke . Patient self care activities - Over the next 180 days, patient will: o Check BP 2-3 times weekly, document, and provide at future appointments o Ensure daily salt intake < 2300 mg/day  Hyperlipidemia Lab Results  Component Value Date/Time   LDLCALC 130 (H) 05/10/2020 10:28 AM   LDLCALC 183 (H) 05/22/2014 09:56 AM   LDLDIRECT 175.3 04/24/2008 08:48 AM .  Pharmacist Clinical Goal(s): o Over the next 180 days, patient will work with PharmD and providers to achieve LDL goal < 130 . Current regimen:  o rosuvastatin 20 mg 3x per week o OTC fish oil . Interventions: o Discussed cholesterol goals and benefits of medications for prevention of heart attack / stroke . Patient self care activities - Over the next 180 days, patient will: o Continue medication as prescribed  GERD . Pharmacist Clinical Goal(s) o Over the next 180 days, patient will work with PharmD and providers to optimize therapy . Current regimen:  o OTC esomeprazole 20 mg daily o famotidine 20 mg twice a day o Mylanta PRN . Interventions: o Recommend using [employer] OTC benefit to obtain Nexium for free o Recommend taking famotidine 15-30 min prior to food that may cause heartburn for best results . Patient self care activities - Over the next 180 days, patient  will: o Continue medications as instructed  Medication management . Pharmacist Clinical Goal(s): o Over the next 180 days, patient will work with PharmD and providers to maintain optimal medication adherence . Current [facility] . Interventions o Comprehensive medication review performed. o Continue current medication management strategy . Patient self care activities - Over the next 180 days, patient will: o Focus on medication adherence by fill date o Take medications as prescribed o Report any questions or concerns to PharmD and/or provider(s)  Please see past updates related to this goal by clicking on the "Past Updates" button in the selected goal        Hypertension   BP goal < 130/80  Office blood pressures are  BP Readings from Last 3 Encounters:  05/10/20 124/70  05/10/20 124/70  07/26/19 108/69   Patient checks BP at home 3-5x per  week  Patient home BP readings are ranging: "normal"  Patient has failed these meds in the past: n/a Patient is currently controlled on the following medications:   HCTZ 12.5 mg daily  We discussed diet and exercise extensively, BP goals and assured patient she is currently at goal.   Plan  Continue current medications and control with diet and exercise   Hyperlipidemia   LDL goal < 100  Last lipids Lab Results  Component Value Date   CHOL 203 (H) 05/10/2020   HDL 57.00 05/10/2020   LDLCALC 130 (H) 05/10/2020   LDLDIRECT 175.3 04/24/2008   TRIG 82.0 05/10/2020   CHOLHDL 4 05/10/2020   Hepatic Function Latest Ref Rng & Units 05/10/2020 03/30/2019 05/16/2018  Total Protein 6.0 - 8.3 g/dL 6.9 7.2 7.3  Albumin 3.5 - 5.2 g/dL 4.2 4.3 4.5  AST 0 - 37 U/L $Remo'24 22 27  'dPrsQ$ ALT 0 - 35 U/L $Remo'22 23 28  'ZPLRL$ Alk Phosphatase 39 - 117 U/L 56 55 55  Total Bilirubin 0.2 - 1.2 mg/dL 1.2 0.9 1.1  Bilirubin, Direct 0.0 - 0.3 mg/dL - - -     Ref. Range 01/22/2014 15:32 05/22/2014 09:56 08/27/2014 09:20  CK Total Latest Ref Range: 7.0 - 177.0  U/L 528 (H) 277 (H) 391 (H)   The 10-year ASCVD risk score Mikey Bussing DC Jr., et al., 2013) is: 14.3%   Values used to calculate the score:     Age: 59 years     Sex: Female     Is Non-Hispanic African American: Yes     Diabetic: No     Tobacco smoker: No     Systolic Blood Pressure: 665 mmHg     Is BP treated: Yes     HDL Cholesterol: 57 mg/dL     Total Cholesterol: 203 mg/dL  Patient has failed these meds in past: simvastatin Patient is currently uncontrolled on the following medications:   rosuvastatin 20 mg 3x per week,   OTC fish oil  We discussed:  Most recent lipid panel showed improvement since previous; pt reports adherence with statin as prescribed  Plan  Continue current medications and control with diet and exercise   GERD   Patient has failed these meds in past: rx esomeprazole (dizziness), pantoprazole (vivid dreams), omeprazole (dreams) Patient is currently controlled on the following medications:   OTC esomeprazole 20 mg daily  famotidine 20 mg BID,   Mylanta PRN  We discussed: Patient is satisfied with current regimen and denies issues  Plan  Continue current medications   COVID positive 07/11/20   We discussed:  Pt is feeling much better after prednisone course. Advised she wear a KN95 and a surgical mask on top if she goes to the hospital to visit her husband  Plan  Continue to monitor  Medication Management   [facility] Does not use pill box Pt endorses compliances with meds  We discussed:  Benefits of medication synchronization, packaging and delivery with [employer]. [facility]s [facility] for now.  Plan  Continue current med management strategy   Follow up:  6 month phone visit  Charlene Brooke, PharmD, [facility] Clinical Pharmacist [facility] at [facility] [phone]

## 2020-07-30 ENCOUNTER — Telehealth: Payer: Self-pay | Admitting: Internal Medicine

## 2020-07-30 NOTE — Telephone Encounter (Signed)
See below

## 2020-07-30 NOTE — Telephone Encounter (Signed)
   Patient calling to report she tested positive for COVID on 01/15 She is still having a "little sore throat, and cough " lingering . She is seeking advice on OTC remedies Declined virtual visit at this time   Please advise

## 2020-07-31 NOTE — Telephone Encounter (Signed)
The cough will likely go away on its own within 1-2 months of covid-19. Does not need to take anything for it unless desired. Can take otc.

## 2020-08-01 NOTE — Telephone Encounter (Signed)
Patient states when she walks she is getting winded and wanted to know what she should do about that.

## 2020-08-02 NOTE — Telephone Encounter (Signed)
See below

## 2020-08-02 NOTE — Telephone Encounter (Signed)
Unable to get in contact with the patient to offer her a appointment. LVM for her to call our office to schedule her appointment. Office number was provided.    If patient calls back please offer her a in office or virtual appointment to discuss her concerns.

## 2020-08-02 NOTE — Telephone Encounter (Signed)
Can have in person or virtual to discuss further and treatment options if needed.

## 2020-08-05 ENCOUNTER — Other Ambulatory Visit: Payer: Self-pay

## 2020-08-05 ENCOUNTER — Ambulatory Visit (INDEPENDENT_AMBULATORY_CARE_PROVIDER_SITE_OTHER): Payer: Medicare HMO | Admitting: Internal Medicine

## 2020-08-05 ENCOUNTER — Ambulatory Visit (INDEPENDENT_AMBULATORY_CARE_PROVIDER_SITE_OTHER): Payer: Medicare HMO

## 2020-08-05 ENCOUNTER — Encounter: Payer: Self-pay | Admitting: Internal Medicine

## 2020-08-05 VITALS — BP 122/82 | HR 69 | Temp 98.3°F | Resp 18 | Ht 60.0 in | Wt 140.2 lb

## 2020-08-05 DIAGNOSIS — J9811 Atelectasis: Secondary | ICD-10-CM | POA: Diagnosis not present

## 2020-08-05 DIAGNOSIS — R0602 Shortness of breath: Secondary | ICD-10-CM

## 2020-08-05 DIAGNOSIS — R0600 Dyspnea, unspecified: Secondary | ICD-10-CM | POA: Diagnosis not present

## 2020-08-05 DIAGNOSIS — B948 Sequelae of other specified infectious and parasitic diseases: Secondary | ICD-10-CM

## 2020-08-05 NOTE — Progress Notes (Signed)
   Subjective:   Patient ID: [name], female    DOB: 12-11-1945, 75 y.o.   MRN: [id number]  HPI The patient is a 75 YO female coming in for follow up after covid-19. She tested positive around 07/09/20 and had some coughing and SOB on exertion as well as other symptoms during covid-19. Overall she is feeling improved but still some tired. Her husband is in the hospital and likely is currently dying of covid-19. She is vaccinated and booster but husband did not get booster. She was walking through the hospital to get to his room and get some tired/sob. She denies wheezing. Coughing is minimal at this time. Denies fevers or chills. No SOB with normal activities around the house.   Review of Systems  Constitutional: Negative.   HENT: Negative.   Eyes: Negative.   Respiratory: Positive for cough and shortness of breath. Negative for chest tightness.   Cardiovascular: Negative for chest pain, palpitations and leg swelling.  Gastrointestinal: Negative for abdominal distention, abdominal pain, constipation, diarrhea, nausea and vomiting.  Musculoskeletal: Negative.   Skin: Negative.   Neurological: Negative.   Psychiatric/Behavioral: Negative.     Objective:  Physical Exam Constitutional:      Appearance: She is well-developed and well-nourished.  HENT:     Head: Normocephalic and atraumatic.  Eyes:     Extraocular Movements: EOM normal.  Cardiovascular:     Rate and Rhythm: Normal rate and regular rhythm.  Pulmonary:     Effort: Pulmonary effort is normal. No respiratory distress.     Breath sounds: Normal breath sounds. No stridor. No wheezing, rhonchi or rales.  Chest:     Chest wall: No tenderness.  Abdominal:     General: Bowel sounds are normal. There is no distension.     Palpations: Abdomen is soft.     Tenderness: There is no abdominal tenderness. There is no rebound.  Musculoskeletal:        General: No edema.     Cervical back: Normal range of motion.  Skin:     General: Skin is warm and dry.  Neurological:     Mental Status: She is alert and oriented to person, place, and time.     Coordination: Coordination normal.  Psychiatric:        Mood and Affect: Mood and affect normal.     Vitals:   08/05/20 1544  BP: 122/82  Pulse: 69  Resp: 18  Temp: 98.3 F (36.8 C)  TempSrc: Oral  SpO2: 97%  Weight: 140 lb 3.2 oz (63.6 kg)  Height: 5' (1.524 m)    This visit occurred during the SARS-CoV-2 public health emergency.  Safety protocols were in place, including screening questions prior to the visit, additional usage of staff PPE, and extensive cleaning of exam room while observing appropriate contact time as indicated for disinfecting solutions.   Assessment & Plan:

## 2020-08-05 NOTE — Patient Instructions (Signed)
We will do a chest x-ray 

## 2020-08-06 DIAGNOSIS — R0602 Shortness of breath: Secondary | ICD-10-CM | POA: Insufficient documentation

## 2020-08-06 DIAGNOSIS — U099 Post covid-19 condition, unspecified: Secondary | ICD-10-CM | POA: Insufficient documentation

## 2020-08-06 DIAGNOSIS — R0600 Dyspnea, unspecified: Secondary | ICD-10-CM | POA: Insufficient documentation

## 2020-08-06 NOTE — Assessment & Plan Note (Signed)
Checking CXR today to rule out pneumonia. She is likely some deconditioned and walking in hospital is a longer distance than she is used to walking.

## 2020-08-07 ENCOUNTER — Encounter: Payer: Self-pay | Admitting: Family Medicine

## 2020-08-07 ENCOUNTER — Ambulatory Visit (INDEPENDENT_AMBULATORY_CARE_PROVIDER_SITE_OTHER): Payer: Medicare HMO | Admitting: Family Medicine

## 2020-08-07 ENCOUNTER — Other Ambulatory Visit: Payer: Self-pay

## 2020-08-07 VITALS — BP 130/83 | HR 85 | Wt 140.0 lb

## 2020-08-07 DIAGNOSIS — N951 Menopausal and female climacteric states: Secondary | ICD-10-CM

## 2020-08-07 DIAGNOSIS — Z01419 Encounter for gynecological examination (general) (routine) without abnormal findings: Secondary | ICD-10-CM

## 2020-08-07 NOTE — Progress Notes (Signed)
   GYNECOLOGY PROBLEM  VISIT ENCOUNTER NOTE  Subjective:   [name] is a 75 y.o. [phone] female here for a a problem GYN visit.  Current complaints: none- does not use her estrace regularly. She reports recent COVID infection, mild for her but her husband is currently hospitalized for covid PNA and is on respiratory support. They are having family care conference today.   Denies abnormal vaginal bleeding, discharge, pelvic pain, problems with intercourse or other gynecologic concerns.    Gynecologic History No LMP recorded. Patient has had a hysterectomy. Contraception: post menopausal status Mammogram scheduled for 08/19/20 There are no preventive care reminders to display for this patient.   The following portions of the patient's history were reviewed and updated as appropriate: allergies, current medications, past family history, past medical history, past social history, past surgical history and problem list.  Review of Systems Pertinent items are noted in HPI.   Objective:  BP 130/83   Pulse 85   Wt 140 lb (63.5 kg)   BMI 27.34 kg/m  Gen: well appearing, NAD HEENT: no scleral icterus CV: RR Lung: Normal WOB Ext: warm well perfused Breast: symmetric, everted nipples bilaterally. Right and l No predominant cysts or masses. Non-tender to palpation. No nipple discharge bilaterally.  PELVIC: deferred   Assessment and Plan:   1. Vaginal dryness, menopausal Encouraged her to use when needed. Refills sent  2. Challenging life situation: Husband with COVID- provided support. Encouraged her effort to assure she is getting care.  3. HCM - Performed clinical breast exam in case patient needs to rescheduled upcoming 2/21 mammogram.  Please refer to After Visit Summary for other counseling recommendations.   Return for Yearly wellness exam.  Caren Macadam, MD, MPH, ABFM Attending [place] for [facility]

## 2020-08-08 MED ORDER — ESTRADIOL 0.1 MG/GM VA CREA
1.0000 | TOPICAL_CREAM | VAGINAL | 3 refills | Status: DC
Start: 2020-08-09 — End: 2022-11-09

## 2020-08-20 ENCOUNTER — Other Ambulatory Visit: Payer: Self-pay

## 2020-08-20 ENCOUNTER — Ambulatory Visit
Admission: RE | Admit: 2020-08-20 | Discharge: 2020-08-20 | Disposition: A | Discharge status: Home / Self Care | Payer: Medicare HMO | Source: Ambulatory Visit | Attending: Internal Medicine | Admitting: Internal Medicine

## 2020-08-20 DIAGNOSIS — Z1231 Encounter for screening mammogram for malignant neoplasm of breast: Secondary | ICD-10-CM

## 2020-09-02 ENCOUNTER — Telehealth: Payer: Medicare HMO

## 2020-09-30 ENCOUNTER — Other Ambulatory Visit: Payer: Self-pay | Admitting: Internal Medicine

## 2020-10-02 ENCOUNTER — Telehealth: Payer: Self-pay

## 2020-10-02 NOTE — Progress Notes (Signed)
    Chronic Care Management Pharmacy Assistant   Name: [name]  MRN: [id number] DOB: Aug 28, 1945   Reason for Encounter: Disease State-General Adherence   Recent office visits:  08/05/20 [place] - [facility]-PCP  Recent consult visits:  08/07/20 [place] Hospital visits:  None in previous 6 months  Medications: Outpatient Encounter Medications as of 10/02/2020  Medication Sig Note  . acetaminophen (TYLENOL) 500 MG tablet Take 500-750 mg by mouth every 6 (six) hours as needed for pain.   Marland Kitchen alum & mag hydroxide-simeth (MAALOX/MYLANTA) 200-200-20 MG/5ML suspension Take 15 mLs by mouth as needed for indigestion or heartburn.   . benzonatate (TESSALON) 100 MG capsule Take 2 capsules (200 mg total) by mouth 3 (three) times daily as needed for cough.   . esomeprazole (NEXIUM) 40 MG capsule Take 1 capsule (40 mg total) by mouth daily. (Patient taking differently: Take 20 mg by mouth daily.) 08/31/2019: Using OTC Nexium due tolerability issues with Rx version  . estradiol (ESTRACE) 0.1 MG/GM vaginal cream Place 1 Applicatorful vaginally 3 (three) times a week.   . famotidine (PEPCID) 20 MG tablet TAKE 1 TABLET BY MOUTH TWICE A DAY   . fluticasone (FLONASE) 50 MCG/ACT nasal spray Place 2 sprays into both nostrils daily.   . hydrochlorothiazide (HYDRODIURIL) 12.5 MG tablet Take 1 tablet (12.5 mg total) by mouth daily.   . Multiple Vitamin (MULTIVITAMIN) tablet Take 1 tablet by mouth every other day.    . Omega-3 Fatty Acids (FISH OIL PO) Take by mouth.   Marland Kitchen OVER THE COUNTER MEDICATION Place 1 suppository rectally daily as needed (for hemmorhoids).   Vladimir Faster Glycol-Propyl Glycol (SYSTANE) 0.4-0.3 % SOLN Apply to eye.   . rosuvastatin (CRESTOR) 20 MG tablet TAKE 1 TABLET BY MOUTH 3 TIMES A WEEK.   Marland Kitchen tetrahydrozoline 0.05 % ophthalmic solution Place 2 drops into both eyes daily as needed (for dry eyes).    No facility-administered encounter medications on file as of  10/02/2020.   Have you had any problems recently with your health? Patient stated that she gets a headache sometimes on the right side of her head.  Have you had any problems with your pharmacy? Patient stated no problems at this time.  What issues or side effects are you having with your medications? Patient stated no problems at this time.  What would you like me to pass along to [facility] for them to help you with? Patient stated not at this time.  What can we do to take care of you better? Patient stated not at this time, she also states she is doing better since the death of her husband..  Star Rating Drugs: Rosuvastatin 07/15/20 [place], RMA Clinical Pharmacists Assistant [phone]  Time Spent:

## 2020-10-15 ENCOUNTER — Telehealth: Payer: Self-pay

## 2020-10-15 ENCOUNTER — Ambulatory Visit: Payer: Medicare HMO | Admitting: Gastroenterology

## 2020-10-15 ENCOUNTER — Encounter: Payer: Self-pay | Admitting: Gastroenterology

## 2020-10-15 ENCOUNTER — Other Ambulatory Visit: Payer: Self-pay

## 2020-10-15 VITALS — BP 126/68 | HR 80 | Ht 60.0 in | Wt 141.0 lb

## 2020-10-15 DIAGNOSIS — K219 Gastro-esophageal reflux disease without esophagitis: Secondary | ICD-10-CM

## 2020-10-15 DIAGNOSIS — K921 Melena: Secondary | ICD-10-CM

## 2020-10-15 MED ORDER — NA SULFATE-K SULFATE-MG SULF 17.5-3.13-1.6 GM/177ML PO SOLN: 1.0000 | Freq: Once | ORAL | 0 refills | Status: AC

## 2020-10-15 NOTE — Progress Notes (Addendum)
-   History of Present Illness: This is a 75 year old female referred by Hoyt Koch, * for the evaluation of rectal bleeding.  She relates a few episodes of very small volumes of rectal bleeding with bowel movement.  She relates having hemorrhoid that protrudes occasionally for many years.  She occasionally also notes mild intermittent perianal itching.  Reflux symptoms are under good control but she has breakthrough symptoms a couple times per week and takes famotidine which is generally effective.  Occasional mild constipation which corrects easily with stool softeners. Denies weight loss, abdominal pain, diarrhea, change in stool caliber, melena, nausea, vomiting, dysphagia, chest pain.    Allergies  Allergen Reactions  . Zocor [Simvastatin]     12/2013 CK 528  . Advil [Ibuprofen] Swelling    Tongue swells  . Bran [Wheat Bran] Other (See Comments)    Very bad GI pain  . Hydrocodone Nausea Only and Other (See Comments)     TACHYCARDIA  . Hydrocodone-Acetaminophen Nausea Only    Other reaction(s): Increased Heart Rate (intolerance)  . Lipitor [Atorvastatin] Other (See Comments)    Bad dreams  . Pravachol [Pravastatin Sodium] Other (See Comments)    Bad dreams   Outpatient Medications Prior to Visit  Medication Sig Dispense Refill  . acetaminophen (TYLENOL) 500 MG tablet Take 500-750 mg by mouth every 6 (six) hours as needed for pain.    Marland Kitchen alum & mag hydroxide-simeth (MAALOX/MYLANTA) 200-200-20 MG/5ML suspension Take 15 mLs by mouth as needed for indigestion or heartburn.    . esomeprazole (NEXIUM) 40 MG capsule Take 1 capsule (40 mg total) by mouth daily. 90 capsule 3  . famotidine (PEPCID) 20 MG tablet TAKE 1 TABLET BY MOUTH TWICE A DAY (Patient taking differently: Take 20 mg by mouth as needed.) 60 tablet 1  . fluticasone (FLONASE) 50 MCG/ACT nasal spray Place 2 sprays into both nostrils daily. (Patient taking differently: Place 2 sprays into both nostrils daily as needed.)  48 g 6  . hydrochlorothiazide (HYDRODIURIL) 12.5 MG tablet Take 1 tablet (12.5 mg total) by mouth daily. 90 tablet 3  . Iron-Vitamins (S.S.S. TONIC PO) Take by mouth.    . Multiple Vitamin (MULTIVITAMIN) tablet Take 1 tablet by mouth 3 (three) times a week.    Marland Kitchen OVER THE COUNTER MEDICATION Place 1 suppository rectally daily as needed (for hemmorhoids).    . rosuvastatin (CRESTOR) 20 MG tablet TAKE 1 TABLET BY MOUTH 3 TIMES A WEEK. 36 tablet 0  . VITAMIN D PO Take 1 tablet by mouth daily.    Marland Kitchen estradiol (ESTRACE) 0.1 MG/GM vaginal cream Place 1 Applicatorful vaginally 3 (three) times a week. (Patient not taking: Reported on 10/15/2020) 42.5 g 3  . benzonatate (TESSALON) 100 MG capsule Take 2 capsules (200 mg total) by mouth 3 (three) times daily as needed for cough. 40 capsule 0  . Omega-3 Fatty Acids (FISH OIL PO) Take by mouth.    Vladimir Faster Glycol-Propyl Glycol (SYSTANE) 0.4-0.3 % SOLN Apply to eye.    . tetrahydrozoline 0.05 % ophthalmic solution Place 2 drops into both eyes daily as needed (for dry eyes).     No facility-administered medications prior to visit.   Past Medical History:  Diagnosis Date  . Arthritis    KNEES  . CTS (carpal tunnel syndrome)    BILATERAL LEFT > RIGHT (OCC. HAND NUMBNESS)  . Female cystocele   . GERD (gastroesophageal reflux disease)   . H/O hiatal hernia   . History of cervical  dysplasia    CIN II  . History of peritonitis    2006--  S/P PERFORATED BOWEL INJURY DURING SURGERY  . Hyperlipidemia    Past Surgical History:  Procedure Laterality Date  . ABDOMINAL HYSTERECTOMY  1991   W/ BILATERAL SALPINGOOPHORECTOMY  . BREAST BIOPSY    . LAPAROSCOPIC CHOLECYSTECTOMY  2006   W/ PERFERATED BOWEL (INJURY)  . NASAL SEPTUM SURGERY  70's   REPAIR DEVIATED SEPTUM  . TUBAL LIGATION  1980'S  . VAGINAL PROLAPSE REPAIR N/A 08/15/2012   Procedure: VAGINAL VAULT SUSPENSION;  Surgeon: Ailene Rud, MD;  Location: [facility];  Service:  Urology;  Laterality: N/A;  ANTERIOR VAULT REPAIR WITH XENFORE   Social History   Socioeconomic History  . Marital status: Married    Spouse name: Not on file  . Number of children: Not on file  . Years of education: Not on file  . Highest education level: Not on file  Occupational History  . Not on file  Tobacco Use  . Smoking status: Never Smoker  . Smokeless tobacco: Never Used  Vaping Use  . Vaping Use: Never used  Substance and Sexual Activity  . Alcohol use: No    Alcohol/week: 0.0 standard drinks    Comment: no  . Drug use: No  . Sexual activity: Not Currently    Birth control/protection: Surgical  Other Topics Concern  . Not on file  Social History Narrative   Married '68. 2 son - '69, '70; 1 son - '71: 2 grandchildren. 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   Social Determinants of Health   Financial Resource Strain: Not on file  Food Insecurity: Not on file  Transportation Needs: No Transportation Needs  . Lack of Transportation (Medical): No  . Lack of Transportation (Non-Medical): No  Physical Activity: Inactive  . Days of Exercise per Week: 0 days  . Minutes of Exercise per Session: 0 min  Stress: Not on file  Social Connections: Socially Integrated  . Frequency of Communication with Friends and Family: More than three times a week  . Frequency of Social Gatherings with Friends and Family: Once a week  . Attends Religious Services: 1 to 4 times per year  . Active Member of Clubs or Organizations: Yes  . Attends Archivist Meetings: More than 4 times per year  . Marital Status: Married   Family History  Problem Relation Age of Onset  . Coronary artery disease Mother   . Other Mother        cva  . Hypertension Mother   . Heart disease Mother   . Stroke Father   . Breast cancer Sister   . Pancreatic cancer Brother   . Aneurysm Brother        abdominal   . Breast cancer Sister   . Diabetes Other   . Coronary artery disease Other    . Hypertension Other   . Stroke Other   . Cancer Other        breast  . Colon cancer Neg Hx   . Esophageal cancer Neg Hx   . Stomach cancer Neg Hx   . Liver disease Neg Hx       Review of Systems: Pertinent positive and negative review of systems were noted in the above HPI section. All other review of systems were otherwise negative.   Physical Exam: General: Well developed, well nourished, no acute distress Head: Normocephalic and atraumatic Eyes: Sclerae anicteric, EOMI Ears: Normal auditory acuity  Mouth: Not examined, mask on during Covid-19 pandemic Neck: Supple, no masses or thyromegaly Lungs: Clear throughout to auscultation Heart: Regular rate and rhythm; no murmurs, rubs or bruits Abdomen: Soft, non tender and non distended. No masses, hepatosplenomegaly or hernias noted. Normal Bowel sounds Rectal: Deferred to colonoscopy Musculoskeletal: Symmetrical with no gross deformities  Skin: No lesions on visible extremities Pulses:  Normal pulses noted Extremities: No clubbing, cyanosis, edema or deformities noted Neurological: Alert oriented x 4, grossly nonfocal Cervical Nodes:  No significant cervical adenopathy Inguinal Nodes: No significant inguinal adenopathy Psychological:  Alert and cooperative. Normal mood and affect   Assessment and Recommendations:  1.  Intermittent hematochezia, perianal itching.  Suspected hemorrhoidal symptoms.  Rule out colorectal neoplasms and other disorders.  Continue stool softener as needed for mild constipation.  Preparation H cream PR twice daily as needed.  Schedule colonoscopy for further evaluation. The risks (including bleeding, perforation, infection, missed lesions, medication reactions and possible hospitalization or surgery if complications occur), benefits, and alternatives to colonoscopy with possible biopsy and possible polypectomy were discussed with the patient and they consent to proceed.   2. GERD.  Breakthrough  symptoms occurring about twice per week.  Closely follow antireflux measures.  Continue Nexium 40 mg p.o. every morning and take famotidine 20 mg at bedtime.  If reflux symptoms are not adequately controlled consider EGD to further evaluate.   cc: Hoyt Koch, MD [address] [place],  [place] [address]

## 2020-10-15 NOTE — Progress Notes (Signed)
error 

## 2020-10-15 NOTE — Patient Instructions (Signed)
Patient advised to avoid spicy, acidic, citrus, chocolate, mints, fruit and fruit juices.  Limit the intake of caffeine, alcohol and Soda.  Don't exercise too soon after eating.  Don't lie down within 3-4 hours of eating.  Elevate the head of your bed.   You have been scheduled for a colonoscopy. Please follow written instructions given to you at your visit today.  Please pick up your prep supplies at the pharmacy within the next 1-3 days. If you use inhalers (even only as needed), please bring them with you on the day of your procedure.  Normal BMI (Body Mass Index- based on height and weight) is between 23 and 30. Your BMI today is Body mass index is 27.54 kg/m. Marland Kitchen Please consider follow up  regarding your BMI with your Primary Care Provider.  Thank you for choosing me and [facility].  Pricilla Riffle. Dagoberto Ligas., MD., Marval Regal

## 2020-10-28 ENCOUNTER — Ambulatory Visit (AMBULATORY_SURGERY_CENTER): Payer: Medicare HMO | Admitting: Gastroenterology

## 2020-10-28 ENCOUNTER — Other Ambulatory Visit: Payer: Self-pay | Admitting: Gastroenterology

## 2020-10-28 ENCOUNTER — Encounter: Payer: Self-pay | Admitting: Gastroenterology

## 2020-10-28 ENCOUNTER — Other Ambulatory Visit: Payer: Self-pay

## 2020-10-28 VITALS — BP 110/64 | HR 78 | Temp 98.1°F | Resp 11 | Ht 60.0 in | Wt 141.0 lb

## 2020-10-28 DIAGNOSIS — D123 Benign neoplasm of transverse colon: Secondary | ICD-10-CM | POA: Diagnosis not present

## 2020-10-28 DIAGNOSIS — Z1211 Encounter for screening for malignant neoplasm of colon: Secondary | ICD-10-CM | POA: Diagnosis not present

## 2020-10-28 DIAGNOSIS — K640 First degree hemorrhoids: Secondary | ICD-10-CM

## 2020-10-28 DIAGNOSIS — D122 Benign neoplasm of ascending colon: Secondary | ICD-10-CM | POA: Diagnosis not present

## 2020-10-28 DIAGNOSIS — D125 Benign neoplasm of sigmoid colon: Secondary | ICD-10-CM | POA: Diagnosis not present

## 2020-10-28 DIAGNOSIS — K921 Melena: Secondary | ICD-10-CM

## 2020-10-28 DIAGNOSIS — D124 Benign neoplasm of descending colon: Secondary | ICD-10-CM

## 2020-10-28 MED ORDER — SODIUM CHLORIDE 0.9 % IV SOLN
500.0000 mL | Freq: Once | INTRAVENOUS | Status: DC
Start: 2020-10-28 — End: 2020-10-28

## 2020-10-28 NOTE — Patient Instructions (Signed)
YOU HAD AN ENDOSCOPIC PROCEDURE TODAY AT THE [facility]:   Refer to the procedure report that was given to you for any specific questions about what was found during the examination.  If the procedure report does not answer your questions, please call your gastroenterologist to clarify.  If you requested that your care partner not be given the details of your procedure findings, then the procedure report has been included in a sealed envelope for you to review at your convenience later.  YOU SHOULD EXPECT: Some feelings of bloating in the abdomen. Passage of more gas than usual.  Walking can help get rid of the air that was put into your GI tract during the procedure and reduce the bloating. If you had a lower endoscopy (such as a colonoscopy or flexible sigmoidoscopy) you may notice spotting of blood in your stool or on the toilet paper. If you underwent a bowel prep for your procedure, you may not have a normal bowel movement for a few days.  Please Note:  You might notice some irritation and congestion in your nose or some drainage.  This is from the oxygen used during your procedure.  There is no need for concern and it should clear up in a day or so.  SYMPTOMS TO REPORT IMMEDIATELY:   Following lower endoscopy (colonoscopy or flexible sigmoidoscopy):  Excessive amounts of blood in the stool  Significant tenderness or worsening of abdominal pains  Swelling of the abdomen that is new, acute  Fever of 100F or higher   Following upper endoscopy (EGD)  Vomiting of blood or coffee ground material  New chest pain or pain under the shoulder blades  Painful or persistently difficult swallowing  New shortness of breath  Fever of 100F or higher  Black, tarry-looking stools  For urgent or emergent issues, a gastroenterologist can be reached at any hour by calling [phone]. Do not use MyChart messaging for urgent concerns.    DIET:  We do recommend a small meal at first, but  then you may proceed to your regular diet.  Drink plenty of fluids but you should avoid alcoholic beverages for 24 hours.  ACTIVITY:  You should plan to take it easy for the rest of today and you should NOT DRIVE or use heavy machinery until tomorrow (because of the sedation medicines used during the test).    FOLLOW UP: Our staff will call the number listed on your records 48-72 hours following your procedure to check on you and address any questions or concerns that you may have regarding the information given to you following your procedure. If we do not reach you, we will leave a message.  We will attempt to reach you two times.  During this call, we will ask if you have developed any symptoms of COVID 19. If you develop any symptoms (ie: fever, flu-like symptoms, shortness of breath, cough etc.) before then, please call [phone].  If you test positive for Covid 19 in the 2 weeks post procedure, please call and report this information to us.    If any biopsies were taken you will be contacted by phone or by letter within the next 1-3 weeks.  Please call us at [phone] if you have not heard about the biopsies in 3 weeks.    SIGNATURES/CONFIDENTIALITY: You and/or your care partner have signed paperwork which will be entered into your electronic medical record.  These signatures attest to the fact that that the information above on   your After Visit Summary has been reviewed and is understood.  Full responsibility of the confidentiality of this discharge information lies with you and/or your care-partner. 

## 2020-10-28 NOTE — Op Note (Signed)
[name] Procedure Date: 10/28/2020 2:41 PM MRN: [id number] Endoscopist: Ladene Artist , MD Age: 75 Referring MD:  Date of Birth: 1945/08/26 Gender: Female Account #: [id number] Procedure:                Colonoscopy Indications:              Hematochezia Medicines:                Monitored Anesthesia Care Procedure:                Pre-Anesthesia Assessment:                           - Prior to the procedure, a History and Physical                            was performed, and patient medications and                            allergies were reviewed. The patient's tolerance of                            previous anesthesia was also reviewed. The risks                            and benefits of the procedure and the sedation                            options and risks were discussed with the patient.                            All questions were answered, and informed consent                            was obtained. Prior Anticoagulants: The patient has                            taken no previous anticoagulant or antiplatelet                            agents. ASA Grade Assessment: II - A patient with                            mild systemic disease. After reviewing the risks                            and benefits, the patient was deemed in                            satisfactory condition to undergo the procedure.                           After obtaining informed consent, the colonoscope  was passed under direct vision. Throughout the                            procedure, the patient's blood pressure, pulse, and                            oxygen saturations were monitored continuously. The                            Olympus [id number]) Colonoscope was                            introduced through the anus and advanced to the the                            cecum, identified by appendiceal orifice and                             ileocecal valve. The ileocecal valve, appendiceal                            orifice, and rectum were photographed. The quality                            of the bowel preparation was good. The colonoscopy                            was performed without difficulty. The patient                            tolerated the procedure well. Scope In: 2:49:09 PM Scope Out: 3:13:18 PM Scope Withdrawal Time: 0 hours 21 minutes 16 seconds  Total Procedure Duration: 0 hours 24 minutes 9 seconds  Findings:                 The perianal and digital rectal examinations were                            normal.                           A 24 mm polyp was found in the ascending colon. The                            polyp was sessile. The polyp was removed with a                            piecemeal technique using a hot snare. Resection                            and retrieval were complete. Area 5 cm distal to                            the site was tattooed with an injection of  2 mL of                            Spot (carbon black).                           Two sessile polyps were found in the transverse                            colon. The polyps were 3 to 4 mm in size. These                            polyps were removed with a cold biopsy forceps.                            Resection and retrieval were complete.                           Three sessile polyps were found in the sigmoid                            colon (1) and descending colon (2). The polyps were                            6 to 7 mm in size. These polyps were removed with a                            cold snare. Resection and retrieval were complete.                           A 12 mm polyp was found in the sigmoid colon. The                            polyp was sessile. The polyp was removed with a hot                            snare. Resection and retrieval were complete.                           Internal  hemorrhoids were found during                            retroflexion. The hemorrhoids were small and Grade                            I (internal hemorrhoids that do not prolapse).                           The exam was otherwise without abnormality on                            direct and retroflexion views. Complications:            No immediate  complications. Estimated blood loss:                            None. Estimated Blood Loss:     Estimated blood loss: none. Impression:               - One 24 mm polyp in the ascending colon, removed                            piecemeal using a hot snare. Resected and                            retrieved. Tattooed.                           - Two 3 to 4 mm polyps in the transverse colon,                            removed with a cold biopsy forceps. Resected and                            retrieved.                           - Three 6 to 7 mm polyps in the sigmoid colon and                            in the descending colon, removed with a cold snare.                            Resected and retrieved.                           - One 12 mm polyp in the sigmoid colon, removed                            with a hot snare. Resected and retrieved.                           - Internal hemorrhoids.                           - The examination was otherwise normal on direct                            and retroflexion views. Recommendation:           - Repeat colonoscopy in 6 months for surveillance                            after piecemeal polypectomy and after pathology                            review.                           -  Patient has a contact number available for                            emergencies. The signs and symptoms of potential                            delayed complications were discussed with the                            patient. Return to normal activities tomorrow.                            Written discharge instructions  were provided to the                            patient.                           - Resume previous diet.                           - Continue present medications.                           - Await pathology results.                           - No aspirin, ibuprofen, naproxen, or other                            non-steroidal anti-inflammatory drugs for 2 weeks                            after polyp removal. Ladene Artist, MD 10/28/2020 3:19:10 PM This report has been signed electronically.

## 2020-10-28 NOTE — Progress Notes (Signed)
Called to room to assist during endoscopic procedure.  Patient ID and intended procedure confirmed with present staff. Received instructions for my participation in the procedure from the performing physician.  

## 2020-10-28 NOTE — Progress Notes (Signed)
To pacu, VSS. Report to [email or web address] 

## 2020-10-28 NOTE — Progress Notes (Signed)
Vs by CW in adm 

## 2020-10-30 ENCOUNTER — Telehealth: Payer: Self-pay

## 2020-10-30 ENCOUNTER — Ambulatory Visit: Payer: Medicare HMO

## 2020-10-30 NOTE — Telephone Encounter (Signed)
  Follow up Call-  Call back number 10/28/2020  Post procedure Call Back phone  # [phone]  Permission to leave phone message Yes  Some recent data might be hidden     Patient questions:  Do you have a fever, pain , or abdominal swelling? No. Pain Score  0 *  Have you tolerated food without any problems? Yes.    Have you been able to return to your normal activities? Yes.    Do you have any questions about your discharge instructions: Diet   No. Medications  No. Follow up visit  No.  Do you have questions or concerns about your Care? No.  Actions: * If pain score is 4 or above: No action needed, pain <4.  1. Have you developed a fever since your procedure? No   2.   Have you had an respiratory symptoms (SOB or cough) since your procedure? No   3.   Have you tested positive for COVID 19 since your procedure no   4.   Have you had any family members/close contacts diagnosed with the COVID 19 since your procedure?  No    If yes to any of these questions please route to Joylene John, RN and Joella Prince, RN

## 2020-11-06 ENCOUNTER — Telehealth: Payer: Self-pay | Admitting: Gastroenterology

## 2020-11-07 NOTE — Telephone Encounter (Signed)
Patient notified that pathology letter will be sent once Dr. Fuller Plan has reviewed and advised on the recall.  She is reassured that the pathology is benign and letter will be sent soon.

## 2020-11-13 ENCOUNTER — Ambulatory Visit: Payer: Medicare HMO | Attending: Internal Medicine

## 2020-11-13 ENCOUNTER — Encounter: Payer: Self-pay | Admitting: Gastroenterology

## 2020-11-13 DIAGNOSIS — Z23 Encounter for immunization: Secondary | ICD-10-CM

## 2020-11-13 NOTE — Progress Notes (Signed)
   Covid-19 Vaccination Clinic  Name:  [name]    MRN: [id number] DOB: [birth year]  11/13/2020  [name] was observed post Covid-19 immunization for 15 minutes without incident. She was provided with Vaccine Information Sheet and instruction to access the V-Safe system.   [name] was instructed to call 911 with any severe reactions post vaccine: Marland Kitchen Difficulty breathing  . Swelling of face and throat  . A fast heartbeat  . A bad rash all over body  . Dizziness and weakness   Immunizations Administered    Name Date Dose VIS Date Route   PFIZER Comrnaty(Gray TOP) Covid-19 Vaccine 11/13/2020 10:19 AM 0.3 mL 06/06/2020 Intramuscular   Manufacturer: [place] [employer]   Lot: [id number]   NDC: [id number]

## 2020-11-14 ENCOUNTER — Other Ambulatory Visit (HOSPITAL_COMMUNITY): Payer: Self-pay

## 2020-11-14 MED ORDER — COVID-19 MRNA VAC-TRIS(PFIZER) 30 MCG/0.3ML IM SUSP
INTRAMUSCULAR | 0 refills | Status: DC
  Filled 2020-11-14: qty 0.3, 17d supply, fill #0

## 2020-11-15 ENCOUNTER — Other Ambulatory Visit (HOSPITAL_COMMUNITY): Payer: Self-pay

## 2020-11-19 ENCOUNTER — Other Ambulatory Visit (HOSPITAL_COMMUNITY): Payer: Self-pay

## 2020-11-21 ENCOUNTER — Telehealth: Payer: Self-pay | Admitting: Pharmacist

## 2020-11-21 NOTE — Chronic Care Management (AMB) (Signed)
    Chronic Care Management Pharmacy Assistant   Name: [name]  MRN: [id number] DOB: 02/26/1946   Reason for Encounter: Disease State General Call    Recent office visits:  None ID  Recent consult visits:  10/28/20 Dr. Lucio Edward colonoscopy procedure  Hospital visits:  None in previous 6 months  Medications: Outpatient Encounter Medications as of 11/21/2020  Medication Sig Note  . acetaminophen (TYLENOL) 500 MG tablet Take 500-750 mg by mouth every 6 (six) hours as needed for pain.   Marland Kitchen alum & mag hydroxide-simeth (MAALOX/MYLANTA) 200-200-20 MG/5ML suspension Take 15 mLs by mouth as needed for indigestion or heartburn.   Marland Kitchen COVID-19 mRNA Vac-TriS, Pfizer, SUSP injection Inject into the muscle.   . esomeprazole (NEXIUM) 40 MG capsule Take 1 capsule (40 mg total) by mouth daily. 08/31/2019: Using OTC Nexium due tolerability issues with Rx version  . estradiol (ESTRACE) 0.1 MG/GM vaginal cream Place 1 Applicatorful vaginally 3 (three) times a week.   . famotidine (PEPCID) 20 MG tablet TAKE 1 TABLET BY MOUTH TWICE A DAY (Patient taking differently: Take 20 mg by mouth as needed.)   . fluticasone (FLONASE) 50 MCG/ACT nasal spray Place 2 sprays into both nostrils daily. (Patient taking differently: Place 2 sprays into both nostrils daily as needed.)   . hydrochlorothiazide (HYDRODIURIL) 12.5 MG tablet Take 1 tablet (12.5 mg total) by mouth daily.   . Iron-Vitamins (S.S.S. TONIC PO) Take by mouth.   . Multiple Vitamin (MULTIVITAMIN) tablet Take 1 tablet by mouth 3 (three) times a week.   Marland Kitchen OVER THE COUNTER MEDICATION Place 1 suppository rectally daily as needed (for hemmorhoids).   . rosuvastatin (CRESTOR) 20 MG tablet TAKE 1 TABLET BY MOUTH 3 TIMES A WEEK.   Marland Kitchen VITAMIN D PO Take 1 tablet by mouth daily.    No facility-administered encounter medications on file as of 11/21/2020.    Have you had any problems recently with your health? Patient states that she is not having any new  issues at this time  Have you had any problems with your pharmacy? Patient states that she does not have any problems with getting medications or the cost of medications from the pharmacy  What issues or side effects are you having with your medications? Patient states that she does not have any side effects from medications   What would you like me to pass along to [facility] for them to help you with?  Patient states that she is doing much better since her husband passed away and she does not have any concerns at this time  What can we do to take care of you better? Patient states that she was appreciative for the call  Star Rating Drugs: Rosuvastatin 10/01/20 90 d  [place] Pharmacist Assistant [phone]  Time spent:20

## 2020-11-23 ENCOUNTER — Other Ambulatory Visit: Payer: Self-pay | Admitting: Internal Medicine

## 2020-12-22 ENCOUNTER — Other Ambulatory Visit: Payer: Self-pay | Admitting: Internal Medicine

## 2021-01-20 ENCOUNTER — Telehealth: Payer: Self-pay | Admitting: Pharmacist

## 2021-01-20 NOTE — Progress Notes (Signed)
    Chronic Care Management Pharmacy Assistant   Name: [name]  MRN: [id number] DOB: 1946/04/24   Reason for Encounter: Disease State - General Adherence     Recent office visits:  None noted.  Recent consult visits:  None noted.  Hospital visits:  None in previous 6 months  Medications: Outpatient Encounter Medications as of 01/20/2021  Medication Sig Note   acetaminophen (TYLENOL) 500 MG tablet Take 500-750 mg by mouth every 6 (six) hours as needed for pain.    alum & mag hydroxide-simeth (MAALOX/MYLANTA) 200-200-20 MG/5ML suspension Take 15 mLs by mouth as needed for indigestion or heartburn.    COVID-19 mRNA Vac-TriS, Pfizer, SUSP injection Inject into the muscle.    esomeprazole (NEXIUM) 40 MG capsule Take 1 capsule (40 mg total) by mouth daily. 08/31/2019: Using OTC Nexium due tolerability issues with Rx version   estradiol (ESTRACE) 0.1 MG/GM vaginal cream Place 1 Applicatorful vaginally 3 (three) times a week.    famotidine (PEPCID) 20 MG tablet TAKE 1 TABLET BY MOUTH TWICE A DAY    fluticasone (FLONASE) 50 MCG/ACT nasal spray Place 2 sprays into both nostrils daily. (Patient taking differently: Place 2 sprays into both nostrils daily as needed.)    hydrochlorothiazide (HYDRODIURIL) 12.5 MG tablet Take 1 tablet (12.5 mg total) by mouth daily.    Iron-Vitamins (S.S.S. TONIC PO) Take by mouth.    Multiple Vitamin (MULTIVITAMIN) tablet Take 1 tablet by mouth 3 (three) times a week.    OVER THE COUNTER MEDICATION Place 1 suppository rectally daily as needed (for hemmorhoids).    rosuvastatin (CRESTOR) 20 MG tablet TAKE 1 TABLET BY MOUTH THREE TIMES A WEEK    VITAMIN D PO Take 1 tablet by mouth daily.    No facility-administered encounter medications on file as of 01/20/2021.   Have you had any problems recently with your health? Patient denies any recent problems with her health. She is seeing the eye doctor this week about her dry eyes but otherwise no complaints.    Have you had any problems with your pharmacy? Patient denies any concerns with her pharmacy.  What issues or side effects are you having with your medications? Patient denies any side effects or concerns with her current medications.   What would you like me to pass along to [facility], [facility] for them to help you with?  Patient does not have any current concerns or anything to pass along at this time.   What can we do to take care of you better?  Patient does not have any recommendations at this time.   Star Rating Drugs: Rosuvastatin 20 mg  - last filled 12/23/20 84 days (Patient reports she does not take this medicine everyday computer last fill documented was 10/01/20 patient read recent fill date off current bottle and is 12/23/20)  Jobe Gibbon, [place] Pharmacist Assistant  [phone]  Time Spent: 20 minutes

## 2021-01-23 DIAGNOSIS — H26492 Other secondary cataract, left eye: Secondary | ICD-10-CM | POA: Diagnosis not present

## 2021-01-23 DIAGNOSIS — Z961 Presence of intraocular lens: Secondary | ICD-10-CM | POA: Diagnosis not present

## 2021-02-28 DIAGNOSIS — R300 Dysuria: Secondary | ICD-10-CM | POA: Diagnosis not present

## 2021-02-28 DIAGNOSIS — R102 Pelvic and perineal pain: Secondary | ICD-10-CM | POA: Diagnosis not present

## 2021-03-05 ENCOUNTER — Telehealth: Payer: Self-pay | Admitting: Internal Medicine

## 2021-03-05 NOTE — Telephone Encounter (Signed)
I checked her health maintenance and it says that she has completed her pneumonia vaccine series. Please advise

## 2021-03-05 NOTE — Telephone Encounter (Signed)
Patient wants to know if she is due for the Pneumococcal vaccine  Also would like to speak w/ nurse before making appt to have vaccine done  Please call [phone]

## 2021-03-06 NOTE — Telephone Encounter (Signed)
She is up to date on pneumonia and does not need one.

## 2021-03-06 NOTE — Telephone Encounter (Signed)
Spoke with the patient and she verbalized understanding Dr. Nathanial Millman recommendation. No other questions or concerns.

## 2021-03-13 ENCOUNTER — Other Ambulatory Visit: Payer: Self-pay | Admitting: Internal Medicine

## 2021-03-19 ENCOUNTER — Encounter: Payer: Self-pay | Admitting: Gastroenterology

## 2021-03-19 ENCOUNTER — Ambulatory Visit: Payer: Medicare HMO | Admitting: Gastroenterology

## 2021-03-19 VITALS — BP 110/70 | HR 84 | Ht <= 58 in | Wt 140.4 lb

## 2021-03-19 DIAGNOSIS — K219 Gastro-esophageal reflux disease without esophagitis: Secondary | ICD-10-CM

## 2021-03-19 DIAGNOSIS — R1013 Epigastric pain: Secondary | ICD-10-CM

## 2021-03-19 DIAGNOSIS — K5900 Constipation, unspecified: Secondary | ICD-10-CM

## 2021-03-19 DIAGNOSIS — Z86010 Personal history of colonic polyps: Secondary | ICD-10-CM

## 2021-03-19 MED ORDER — PLENVU 140 G PO SOLR: 1.0000 | Freq: Once | ORAL | 0 refills | Status: AC

## 2021-03-19 NOTE — Patient Instructions (Signed)
You can take over the counter Miralax daily for constipation.  You have been scheduled for an endoscopy and colonoscopy. Please follow the written instructions given to you at your visit today. Please pick up your prep supplies at the pharmacy within the next 1-3 days. If you use inhalers (even only as needed), please bring them with you on the day of your procedure.  The [facility] providers would like to encourage you to use [facility] to communicate with providers for non-urgent requests or questions.  Due to long hold times on the telephone, sending your provider a message by [facility] may be a faster and more efficient way to get a response.  Please allow 48 business hours for a response.  Please remember that this is for non-urgent requests.   Due to recent changes in healthcare laws, you may see the results of your imaging and laboratory studies on MyChart before your provider has had a chance to review them.  We understand that in some cases there may be results that are confusing or concerning to you. Not all laboratory results come back in the same time frame and the provider may be waiting for multiple results in order to interpret others.  Please give [place] 48 hours in order for your provider to thoroughly review all the results before contacting the office for clarification of your results.   Normal BMI (Body Mass Index- based on height and weight) is between 23 and 30. Your BMI today is Body mass index is 29.34 kg/m. Marland Kitchen Please consider follow up  regarding your BMI with your Primary Care Provider.  Thank you for choosing me and [facility].  Pricilla Riffle. Dagoberto Ligas., MD., [facility]

## 2021-03-19 NOTE — Progress Notes (Signed)
    History of Present Illness: This is a 75 year old female who relates intermittent difficulties with epigastric pain and reflux symptoms.  Her symptoms were active several weeks ago and have recently been under better control.  She takes famotidine twice a day as needed and/or Maalox as needed for breakthrough symptoms.  EGD in September 2013 showed a 4 cm hiatal hernia, mild gastritis, otherwise unremarkable.  She has frequent difficulties with constipation.  She has used magnesium citrate frequently however magnesium citrate is currently unavailable.   Current Medications, Allergies, Past Medical History, Past Surgical History, Family History and Social History were reviewed in [employer] record.   Physical Exam: General: Well developed, well nourished, no acute distress Head: Normocephalic and atraumatic Eyes: Sclerae anicteric, EOMI Ears: Normal auditory acuity Mouth: Not examined, mask on during Covid-19 pandemic Lungs: Clear throughout to auscultation Heart: Regular rate and rhythm; no murmurs, rubs or bruits Abdomen: Soft, non tender and non distended. No masses, hepatosplenomegaly or hernias noted. Normal Bowel sounds Rectal: Not done  Musculoskeletal: Symmetrical with no gross deformities  Pulses:  Normal pulses noted Extremities: No clubbing, cyanosis, edema or deformities noted Neurological: Alert oriented x 4, grossly nonfocal Psychological:  Alert and cooperative. Normal mood and affect   Assessment and Recommendations:  Piecemeal polypectomy of 24 mm ascending colon polyp in May 2022 due for follow up colonoscopy.  Several other tubular adenomas removed.  Schedule colonoscopy. The risks (including bleeding, perforation, infection, missed lesions, medication reactions and possible hospitalization or surgery if complications occur), benefits, and alternatives to colonoscopy with possible biopsy and possible polypectomy were discussed with the patient  and they consent to proceed.   Epigastric pain, GERD.  Rule out esophagitis, gastritis, ulcer.  Continue Nexium 40 mg p.o. every morning and, famotidine 20 mg p.o. twice daily as needed and Maalox as needed.  Schedule EGD at time of colonoscopy. The risks (including bleeding, perforation, infection, missed lesions, medication reactions and possible hospitalization or surgery if complications occur), benefits, and alternatives to endoscopy with possible biopsy and possible dilation were discussed with the patient and they consent to proceed.  If EGD is unremarkable and symptoms persist consider abdominal imaging. Constipation.  Increase dietary fiber and water intake.  Begin MiraLAX 1-2 times daily titrated for complete bowel movement daily.

## 2021-04-07 ENCOUNTER — Ambulatory Visit (INDEPENDENT_AMBULATORY_CARE_PROVIDER_SITE_OTHER): Payer: Medicare HMO | Admitting: Internal Medicine

## 2021-04-07 ENCOUNTER — Encounter: Payer: Self-pay | Admitting: Internal Medicine

## 2021-04-07 ENCOUNTER — Other Ambulatory Visit: Payer: Self-pay

## 2021-04-07 VITALS — BP 124/78 | HR 82 | Resp 18 | Ht <= 58 in | Wt 140.6 lb

## 2021-04-07 DIAGNOSIS — R7301 Impaired fasting glucose: Secondary | ICD-10-CM

## 2021-04-07 DIAGNOSIS — E782 Mixed hyperlipidemia: Secondary | ICD-10-CM | POA: Diagnosis not present

## 2021-04-07 DIAGNOSIS — I10 Essential (primary) hypertension: Secondary | ICD-10-CM | POA: Diagnosis not present

## 2021-04-07 LAB — COMPREHENSIVE METABOLIC PANEL
ALT: 22 U/L (ref 0–35)
AST: 26 U/L (ref 0–37)
Albumin: 4.2 g/dL (ref 3.5–5.2)
Alkaline Phosphatase: 58 U/L (ref 39–117)
BUN: 14 mg/dL (ref 6–23)
CO2: 30 mEq/L (ref 19–32)
Calcium: 9.8 mg/dL (ref 8.4–10.5)
Chloride: 102 mEq/L (ref 96–112)
Creatinine, Ser: 0.72 mg/dL (ref 0.40–1.20)
GFR: 81.74 mL/min (ref >60.00)
Glucose, Bld: 100 mg/dL — ABNORMAL HIGH (ref 70–99)
Potassium: 3.7 mEq/L (ref 3.5–5.1)
Sodium: 139 mEq/L (ref 135–145)
Total Bilirubin: 1 mg/dL (ref 0.2–1.2)
Total Protein: 6.9 g/dL (ref 6.0–8.3)

## 2021-04-07 LAB — CBC
HCT: 40.5 % (ref 36.0–46.0)
Hemoglobin: 13.7 g/dL (ref 12.0–15.0)
MCHC: 33.8 g/dL (ref 30.0–36.0)
MCV: 89.8 fl (ref 78.0–100.0)
Platelets: 286 10*3/uL (ref 150.0–400.0)
RBC: 4.51 Mil/uL (ref 3.87–5.11)
RDW: 13.9 % (ref 11.5–15.5)
WBC: 4.7 10*3/uL (ref 4.0–10.5)

## 2021-04-07 LAB — LIPID PANEL
Cholesterol: 190 mg/dL (ref 0–200)
HDL: 57.1 mg/dL (ref >39.00)
LDL Cholesterol: 109 mg/dL — ABNORMAL HIGH (ref 0–99)
NonHDL: 133.04
Total CHOL/HDL Ratio: 3
Triglycerides: 120 mg/dL (ref 0.0–149.0)
VLDL: 24 mg/dL (ref 0.0–40.0)

## 2021-04-07 LAB — HEMOGLOBIN A1C: Hgb A1c MFr Bld: 6.2 % (ref 4.6–6.5)

## 2021-04-07 MED ORDER — HYDROCHLOROTHIAZIDE 12.5 MG PO TABS: 12.5000 mg | ORAL_TABLET | Freq: Every day | ORAL | 3 refills | Status: DC

## 2021-04-07 MED ORDER — ROSUVASTATIN CALCIUM 20 MG PO TABS: ORAL_TABLET | ORAL | 3 refills | Status: DC

## 2021-04-07 NOTE — Progress Notes (Signed)
   Subjective:   Patient ID: [name], female    DOB: 08-May-1946, 75 y.o.   MRN: [id number]  HPI The patient is a 75 YO female coming in for follow up medical conditions.   Review of Systems  Constitutional: Negative.   HENT: Negative.    Eyes: Negative.   Respiratory:  Negative for cough, chest tightness and shortness of breath.   Cardiovascular:  Negative for chest pain, palpitations and leg swelling.  Gastrointestinal:  Negative for abdominal distention, abdominal pain, constipation, diarrhea, nausea and vomiting.  Musculoskeletal: Negative.   Skin: Negative.   Neurological: Negative.   Psychiatric/Behavioral: Negative.     Objective:  Physical Exam Constitutional:      Appearance: She is well-developed.  HENT:     Head: Normocephalic and atraumatic.  Cardiovascular:     Rate and Rhythm: Normal rate and regular rhythm.  Pulmonary:     Effort: Pulmonary effort is normal. No respiratory distress.     Breath sounds: Normal breath sounds. No wheezing or rales.  Abdominal:     General: Bowel sounds are normal. There is no distension.     Palpations: Abdomen is soft.     Tenderness: There is no abdominal tenderness. There is no rebound.  Musculoskeletal:     Cervical back: Normal range of motion.  Skin:    General: Skin is warm and dry.  Neurological:     Mental Status: She is alert and oriented to person, place, and time.     Coordination: Coordination normal.    Vitals:   04/07/21 0947  BP: 124/78  Pulse: 82  Resp: 18  SpO2: 97%  Weight: 140 lb 9.6 oz (63.8 kg)  Height: 4\' 10"  (1.473 m)    This visit occurred during the SARS-CoV-2 public health emergency.  Safety protocols were in place, including screening questions prior to the visit, additional usage of staff PPE, and extensive cleaning of exam room while observing appropriate contact time as indicated for disinfecting solutions.   Assessment & Plan:

## 2021-04-07 NOTE — Patient Instructions (Signed)
We will check the labs today.  Make sure to get the covid-19 booster this fall.

## 2021-04-09 ENCOUNTER — Telehealth: Payer: Self-pay | Admitting: Internal Medicine

## 2021-04-09 NOTE — Telephone Encounter (Signed)
Patient says she is interested in speaking w/ a nutritionist but not right now (at a later time)  Says she would like to speak w/ provider about her being in the pre-diabetic range  Please call patient

## 2021-04-10 ENCOUNTER — Encounter: Payer: Self-pay | Admitting: Internal Medicine

## 2021-04-10 NOTE — Assessment & Plan Note (Signed)
Needs follow up so ordered lipid panel. Refilled her crestor 20 mg daily today and will adjust as needed based on lab results.

## 2021-04-10 NOTE — Assessment & Plan Note (Signed)
Needs follow up so ordered HgA1c test today. Her weight is stable and diet is stable from prior. Treat as appropriate.

## 2021-04-10 NOTE — Assessment & Plan Note (Signed)
BP is at goal on her current hctz 12.5 mg daily and is stable. Checking CMP and CBC and adjust medications as needed.

## 2021-04-11 ENCOUNTER — Other Ambulatory Visit: Payer: Self-pay | Admitting: Internal Medicine

## 2021-04-11 DIAGNOSIS — R7303 Prediabetes: Secondary | ICD-10-CM

## 2021-04-11 NOTE — Telephone Encounter (Signed)
See result note.  

## 2021-04-16 ENCOUNTER — Telehealth: Payer: Self-pay

## 2021-04-16 NOTE — Progress Notes (Signed)
    Chronic Care Management Pharmacy Assistant   Name: EVETT KASSA  MRN: [id number] DOB: 1945/07/29    Reason for Encounter: Disease State   Conditions to be addressed/monitored: General   Recent office visits:  04/07/21 Hoyt Koch, MD-Internal Medicine (Essential hypertension) no med changes  Recent consult visits:  03/19/21 Ladene Artist, MD-Gastroenterology (Hx of adenomatous colonic polyps) med changes:PEG-KCl-NaCl-NaSulf-Na Asc-C (PLENVU) 140 g [facility] visits:  None in previous 6 months  Medications: Outpatient Encounter Medications as of 04/16/2021  Medication Sig Note   acetaminophen (TYLENOL) 500 MG tablet Take 500-750 mg by mouth every 6 (six) hours as needed for pain.    alum & mag hydroxide-simeth (MAALOX/MYLANTA) 200-200-20 MG/5ML suspension Take 15 mLs by mouth as needed for indigestion or heartburn.    esomeprazole (NEXIUM) 40 MG capsule Take 1 capsule (40 mg total) by mouth daily. 08/31/2019: Using OTC Nexium due tolerability issues with Rx version   estradiol (ESTRACE) 0.1 MG/GM vaginal cream Place 1 Applicatorful vaginally 3 (three) times a week.    famotidine (PEPCID) 20 MG tablet TAKE 1 TABLET BY MOUTH TWICE A DAY    fluticasone (FLONASE) 50 MCG/ACT nasal spray Place 2 sprays into both nostrils daily. (Patient taking differently: Place 2 sprays into both nostrils daily as needed.)    hydrochlorothiazide (HYDRODIURIL) 12.5 MG tablet Take 1 tablet (12.5 mg total) by mouth daily.    Iron-Vitamins (S.S.S. TONIC PO) Take by mouth.    Multiple Vitamin (MULTIVITAMIN) tablet Take 1 tablet by mouth 3 (three) times a week.    OVER THE COUNTER MEDICATION Place 1 suppository rectally daily as needed (for hemmorhoids).    rosuvastatin (CRESTOR) 20 MG tablet TAKE 1 TABLET BY MOUTH THREE TIMES A WEEK    VITAMIN D PO Take 1 tablet by mouth daily.    No facility-administered encounter medications on file as of 04/16/2021.   Have you had any problems  recently with your health? Patient states that she does not have any new health issues. She mentioned that she is to have a colonoscopy 04/22/21  Have you had any problems with your pharmacy? Patient states that she has not problems with getting medications or th cost of medications from the pharmacy  What issues or side effects are you having with your medications? Patient states she has no side effects from medications  What would you like me to pass along to [facility] for them to help you with? Patient states that she is doing well  What can we do to take care of you better? Patient just appreciates the call  Care Gaps: Colonoscopy-04/22/21 Diabetic Foot Exam-NA Mammogram-NA Ophthalmology-NA Dexa Scan - NA Annual Well Visit - 05/10/20 Micro albumin-NA Hemoglobin A1c-04/07/21  Star Rating Drugs: Rosuvastatin 20 mg last fill 04/07/21 36 ds  [employer] Pharmacist Assistant [phone]

## 2021-04-22 ENCOUNTER — Other Ambulatory Visit: Payer: Self-pay

## 2021-04-22 ENCOUNTER — Encounter: Payer: Self-pay | Admitting: Gastroenterology

## 2021-04-22 ENCOUNTER — Ambulatory Visit (AMBULATORY_SURGERY_CENTER): Payer: Medicare HMO | Admitting: Gastroenterology

## 2021-04-22 VITALS — BP 109/84 | HR 77 | Temp 97.5°F | Resp 14 | Ht <= 58 in | Wt 140.0 lb

## 2021-04-22 DIAGNOSIS — K449 Diaphragmatic hernia without obstruction or gangrene: Secondary | ICD-10-CM

## 2021-04-22 DIAGNOSIS — D123 Benign neoplasm of transverse colon: Secondary | ICD-10-CM

## 2021-04-22 DIAGNOSIS — Z86010 Personal history of colonic polyps: Secondary | ICD-10-CM | POA: Diagnosis not present

## 2021-04-22 DIAGNOSIS — K219 Gastro-esophageal reflux disease without esophagitis: Secondary | ICD-10-CM

## 2021-04-22 DIAGNOSIS — K2950 Unspecified chronic gastritis without bleeding: Secondary | ICD-10-CM | POA: Diagnosis not present

## 2021-04-22 DIAGNOSIS — R1013 Epigastric pain: Secondary | ICD-10-CM

## 2021-04-22 DIAGNOSIS — K2970 Gastritis, unspecified, without bleeding: Secondary | ICD-10-CM

## 2021-04-22 MED ORDER — SODIUM CHLORIDE 0.9 % IV SOLN: 500.0000 mL | INTRAVENOUS | Status: DC

## 2021-04-22 NOTE — Patient Instructions (Signed)
Handouts on hiatal hernia, polyps, and hemorrhoids given to patient. Await pathology results. Resume previous diet and continue present medications.  Follow antireflux measures.  Repeat colonoscopy for surveillance vs no repeat due to age will be based off pathology results.   YOU HAD AN ENDOSCOPIC PROCEDURE TODAY AT [facility]:   Refer to the procedure report that was given to you for any specific questions about what was found during the examination.  If the procedure report does not answer your questions, please call your gastroenterologist to clarify.  If you requested that your care partner not be given the details of your procedure findings, then the procedure report has been included in a sealed envelope for you to review at your convenience later.  YOU SHOULD EXPECT: Some feelings of bloating in the abdomen. Passage of more gas than usual.  Walking can help get rid of the air that was put into your GI tract during the procedure and reduce the bloating. If you had a lower endoscopy (such as a colonoscopy or flexible sigmoidoscopy) you may notice spotting of blood in your stool or on the toilet paper. If you underwent a bowel prep for your procedure, you may not have a normal bowel movement for a few days.  Please Note:  You might notice some irritation and congestion in your nose or some drainage.  This is from the oxygen used during your procedure.  There is no need for concern and it should clear up in a day or so.  SYMPTOMS TO REPORT IMMEDIATELY:  Following lower endoscopy (colonoscopy or flexible sigmoidoscopy):  Excessive amounts of blood in the stool  Significant tenderness or worsening of abdominal pains  Swelling of the abdomen that is new, acute  Fever of 100F or higher  Following upper endoscopy (EGD)  Vomiting of blood or coffee ground material  New chest pain or pain under the shoulder blades  Painful or persistently difficult swallowing  New shortness of  breath  Fever of 100F or higher  Black, tarry-looking stools  For urgent or emergent issues, a gastroenterologist can be reached at any hour by calling [phone]. Do not use MyChart messaging for urgent concerns.    DIET:  We do recommend a small meal at first, but then you may proceed to your regular diet.  Drink plenty of fluids but you should avoid alcoholic beverages for 24 hours.  ACTIVITY:  You should plan to take it easy for the rest of today and you should NOT DRIVE or use heavy machinery until tomorrow (because of the sedation medicines used during the test).    FOLLOW UP: Our staff will call the number listed on your records 48-72 hours following your procedure to check on you and address any questions or concerns that you may have regarding the information given to you following your procedure. If we do not reach you, we will leave a message.  We will attempt to reach you two times.  During this call, we will ask if you have developed any symptoms of COVID 19. If you develop any symptoms (ie: fever, flu-like symptoms, shortness of breath, cough etc.) before then, please call [phone].  If you test positive for Covid 19 in the 2 weeks post procedure, please call and report this information to [place].    If any biopsies were taken you will be contacted by phone or by letter within the next 1-3 weeks.  Please call us at [phone] if you have not  heard about the biopsies in 3 weeks.    SIGNATURES/CONFIDENTIALITY: You and/or your care partner have signed paperwork which will be entered into your electronic medical record.  These signatures attest to the fact that that the information above on your After Visit Summary has been reviewed and is understood.  Full responsibility of the confidentiality of this discharge information lies with you and/or your care-partner.

## 2021-04-22 NOTE — Progress Notes (Signed)
History & Physical  Primary Care Physician:  Hoyt Koch, MD Primary Gastroenterologist: Lucio Edward, MD  CHIEF COMPLAINT:  Personal history of colon polyps, GERD, epigastric pain  HPI: [name] is a 75 y.o. female presenting epigastric pain and GERD for EGD and surveillance colonoscopy following piecemeal polypectomy in May 2022.   Past Medical History:  Diagnosis Date   Arthritis    KNEES   Blood transfusion without reported diagnosis    Cataract    CTS (carpal tunnel syndrome)    BILATERAL LEFT > RIGHT (OCC. HAND NUMBNESS)   Female cystocele    GERD (gastroesophageal reflux disease)    H/O hiatal hernia    History of cervical dysplasia    CIN II   History of peritonitis    2006--  S/P PERFORATED BOWEL INJURY DURING SURGERY   Hyperlipidemia    Hypertension     Past Surgical History:  Procedure Laterality Date   ABDOMINAL HYSTERECTOMY  1991   W/ BILATERAL SALPINGOOPHORECTOMY   BREAST BIOPSY     LAPAROSCOPIC CHOLECYSTECTOMY  2006   W/ PERFERATED BOWEL (INJURY)   NASAL SEPTUM SURGERY  70's   REPAIR DEVIATED SEPTUM   sepsis after gallbladder surgery     TUBAL LIGATION  1980'S   VAGINAL PROLAPSE REPAIR N/A 08/15/2012   Procedure: VAGINAL VAULT SUSPENSION;  Surgeon: Ailene Rud, MD;  Location: [facility];  Service: Urology;  Laterality: N/A;  ANTERIOR VAULT REPAIR WITH XENFORE    Prior to Admission medications   Medication Sig Start Date End Date Taking? Authorizing Provider  acetaminophen (TYLENOL) 500 MG tablet Take 500-750 mg by mouth every 6 (six) hours as needed for pain.   Yes [provider]  alum & mag hydroxide-simeth (MAALOX/MYLANTA) 200-200-20 MG/5ML suspension Take 15 mLs by mouth as needed for indigestion or heartburn.   Yes [provider]  esomeprazole (NEXIUM) 40 MG capsule Take 1 capsule (40 mg total) by mouth daily. 04/10/19  Yes Hoyt Koch, MD  estradiol (ESTRACE) 0.1 MG/GM  vaginal cream Place 1 Applicatorful vaginally 3 (three) times a week. 08/09/20  Yes Caren Macadam, MD  hydrochlorothiazide (HYDRODIURIL) 12.5 MG tablet Take 1 tablet (12.5 mg total) by mouth daily. 04/07/21  Yes Hoyt Koch, MD  Iron-Vitamins (S.S.S. TONIC PO) Take by mouth.   Yes [provider]  rosuvastatin (CRESTOR) 20 MG tablet TAKE 1 TABLET BY MOUTH THREE TIMES A WEEK 04/07/21  Yes Hoyt Koch, MD  VITAMIN D PO Take 1 tablet by mouth daily.   Yes [provider]  famotidine (PEPCID) 20 MG tablet TAKE 1 TABLET BY MOUTH TWICE A DAY 11/26/20   Hoyt Koch, MD  fluticasone [facility]) 50 MCG/ACT nasal spray Place 2 sprays into both nostrils daily. Patient taking differently: Place 2 sprays into both nostrils daily as needed. 05/16/18   Hoyt Koch, MD  Multiple Vitamin (MULTIVITAMIN) tablet Take 1 tablet by mouth 3 (three) times a week.    [provider]  OVER THE COUNTER MEDICATION Place 1 suppository rectally daily as needed (for hemmorhoids).    [provider]    Current Outpatient Medications  Medication Sig Dispense Refill   acetaminophen (TYLENOL) 500 MG tablet Take 500-750 mg by mouth every 6 (six) hours as needed for pain.     alum & mag hydroxide-simeth (MAALOX/MYLANTA) 200-200-20 MG/5ML suspension Take 15 mLs by mouth as needed for indigestion or heartburn.     esomeprazole (NEXIUM) 40 MG  capsule Take 1 capsule (40 mg total) by mouth daily. 90 capsule 3   estradiol (ESTRACE) 0.1 MG/GM vaginal cream Place 1 Applicatorful vaginally 3 (three) times a week. 42.5 g 3   hydrochlorothiazide (HYDRODIURIL) 12.5 MG tablet Take 1 tablet (12.5 mg total) by mouth daily. 90 tablet 3   Iron-Vitamins (S.S.S. TONIC PO) Take by mouth.     rosuvastatin (CRESTOR) 20 MG tablet TAKE 1 TABLET BY MOUTH THREE TIMES A WEEK 36 tablet 3   VITAMIN D PO Take 1 tablet by mouth daily.     famotidine (PEPCID) 20 MG tablet TAKE 1  TABLET BY MOUTH TWICE A DAY 60 tablet 1   fluticasone (FLONASE) 50 MCG/ACT nasal spray Place 2 sprays into both nostrils daily. (Patient taking differently: Place 2 sprays into both nostrils daily as needed.) 48 g 6   Multiple Vitamin (MULTIVITAMIN) tablet Take 1 tablet by mouth 3 (three) times a week.     OVER THE COUNTER MEDICATION Place 1 suppository rectally daily as needed (for hemmorhoids).     Current Facility-Administered Medications  Medication Dose Route Frequency Provider Last Rate Last Admin   0.9 %  sodium chloride infusion  500 mL Intravenous Continuous Ladene Artist, MD        Allergies as of 04/22/2021 - Review Complete 04/22/2021  Allergen Reaction Noted   Zocor [simvastatin]  02/02/2014   Advil [ibuprofen] Swelling 03/08/2012   Bran [wheat bran] Other (See Comments) 11/18/2012   Hydrocodone Nausea Only and Other (See Comments)    Hydrocodone-acetaminophen Nausea Only 11/15/2015   Lipitor [atorvastatin] Other (See Comments) 03/08/2012   Pravachol [pravastatin sodium] Other (See Comments) 03/08/2012    Family History  Problem Relation Age of Onset   Coronary artery disease Mother    Other Mother        cva   Hypertension Mother    Heart disease Mother    Stroke Father    Breast cancer Sister    Pancreatic cancer Brother    Aneurysm Brother        abdominal    Breast cancer Sister    Diabetes Other    Coronary artery disease Other    Hypertension Other    Stroke Other    Cancer Other        breast   Prostate cancer Brother    Colon cancer Neg Hx    Esophageal cancer Neg Hx    Stomach cancer Neg Hx    Liver disease Neg Hx     Social History   Socioeconomic History   Marital status: Married    Spouse name: Not on file   Number of children: Not on file   Years of education: Not on file   Highest education level: Not on file  Occupational History   Not on file  Tobacco Use   Smoking status: Never   Smokeless tobacco: Never  Vaping Use    Vaping Use: Never used  Substance and Sexual Activity   Alcohol use: No    Alcohol/week: 0.0 standard drinks    Comment: no   Drug use: No   Sexual activity: Not Currently    Birth control/protection: Surgical  Other Topics Concern   Not on file  Social History Narrative   Married '68. 2 son - '69, '70; 1 son - '71: 2 grandchildren. 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   Social Determinants of Health   Financial Resource Strain: Not on file  Food Insecurity: Not on file  Transportation  Needs: No Transportation Needs   Lack of Transportation (Medical): No   Lack of Transportation (Non-Medical): No  Physical Activity: Inactive   Days of Exercise per Week: 0 days   Minutes of Exercise per Session: 0 min  Stress: Not on file  Social Connections: Socially Integrated   Frequency of Communication with Friends and Family: More than three times a week   Frequency of Social Gatherings with Friends and Family: Once a week   Attends Religious Services: 1 to 4 times per year   Active Member of [employer] or Organizations: Yes   Attends Music therapist: More than 4 times per year   Marital Status: Married  Human resources officer Violence: Not on file    Review of Systems:  All systems reviewed an negative except where noted in HPI.  Gen: Denies any fever, chills, sweats, anorexia, fatigue, weakness, malaise, weight loss, and sleep disorder CV: Denies chest pain, angina, palpitations, syncope, orthopnea, PND, peripheral edema, and claudication. Resp: Denies dyspnea at rest, dyspnea with exercise, cough, sputum, wheezing, coughing up blood, and pleurisy. GI: Denies vomiting blood, jaundice, and fecal incontinence.   Denies dysphagia or odynophagia. GU : Denies urinary burning, blood in urine, urinary frequency, urinary hesitancy, nocturnal urination, and urinary incontinence. MS: Denies joint pain, limitation of movement, and swelling, stiffness, low back pain, extremity pain.  Denies muscle weakness, cramps, atrophy.  Derm: Denies rash, itching, dry skin, hives, moles, warts, or unhealing ulcers.  Psych: Denies depression, anxiety, memory loss, suicidal ideation, hallucinations, paranoia, and confusion. Heme: Denies bruising, bleeding, and enlarged lymph nodes. Neuro:  Denies any headaches, dizziness, paresthesias. Endo:  Denies any problems with DM, thyroid, adrenal function.   Physical Exam: General:  Alert, well-developed, in NAD Head:  Normocephalic and atraumatic. Eyes:  Sclera clear, no icterus.   Conjunctiva pink. Ears:  Normal auditory acuity. Mouth:  No deformity or lesions.  Neck:  Supple; no masses . Lungs:  Clear throughout to auscultation.   No wheezes, crackles, or rhonchi. No acute distress. Heart:  Regular rate and rhythm; no murmurs. Abdomen:  Soft, nondistended, nontender. No masses, hepatomegaly. No obvious masses.  Normal bowel .    Rectal:  Deferred   Msk:  Symmetrical without gross deformities.. Pulses:  Normal pulses noted. Extremities:  Without edema. Neurologic:  Alert and  oriented x4;  grossly normal neurologically. Skin:  Intact without significant lesions or rashes. Cervical Nodes:  No significant cervical adenopathy. Psych:  Alert and cooperative. Normal mood and affect.   Impression / Plan:   History of adenomatous colon polyps with piecemeal polypectomy in May 22 for follow-up colonoscopy. GERD, worsening epigastric pain for EGD.    This patient is appropriate for endoscopic procedures in the ambulatory setting.    Pricilla Riffle. Fuller Plan  04/22/2021, 1:41 PM See Shea Evans, [facility], to contact our on call provider

## 2021-04-22 NOTE — Progress Notes (Signed)
Called to room to assist during endoscopic procedure.  Patient ID and intended procedure confirmed with present staff. Received instructions for my participation in the procedure from the performing physician.  

## 2021-04-22 NOTE — Op Note (Signed)
[facility] Patient Name: [name] Procedure Date: 04/22/2021 1:44 PM MRN: [id number] Endoscopist: Ladene Artist , MD Age: 75 Referring MD:  Date of Birth: 15-Jan-1946 Gender: Female Account #: [id number] Procedure:                Colonoscopy Indications:              Surveillance: Piecemeal removal of large sessile                            adenoma last colonoscopy (< 3 yrs) Medicines:                Monitored Anesthesia Care Procedure:                Pre-Anesthesia Assessment:                           - Prior to the procedure, a History and Physical                            was performed, and patient medications and                            allergies were reviewed. The patient's tolerance of                            previous anesthesia was also reviewed. The risks                            and benefits of the procedure and the sedation                            options and risks were discussed with the patient.                            All questions were answered, and informed consent                            was obtained. Prior Anticoagulants: The patient has                            taken no previous anticoagulant or antiplatelet                            agents. ASA Grade Assessment: II - A patient with                            mild systemic disease. After reviewing the risks                            and benefits, the patient was deemed in                            satisfactory condition to undergo the procedure.  After obtaining informed consent, the colonoscope                            was passed under direct vision. Throughout the                            procedure, the patient's blood pressure, pulse, and                            oxygen saturations were monitored continuously. The                            Olympus [id number]-[id number] (#[id number]) Colonoscope was                            introduced through the anus  and advanced to the the                            cecum, identified by appendiceal orifice and                            ileocecal valve. The ileocecal valve, appendiceal                            orifice, and rectum were photographed. The quality                            of the bowel preparation was good. The colonoscopy                            was performed without difficulty. The patient                            tolerated the procedure well. Scope In: 1:45:40 PM Scope Out: 1:59:07 PM Scope Withdrawal Time: 0 hours 9 minutes 43 seconds  Total Procedure Duration: 0 hours 13 minutes 27 seconds  Findings:                 The perianal and digital rectal examinations were                            normal.                           A tattoo was seen in the ascending colon. A                            post-polypectomy scar was found at the tattoo site.                           A 7 mm polyp was found in the transverse colon. The                            polyp was sessile. The polyp was removed with a  cold snare. Resection and retrieval were complete.                           Internal hemorrhoids were found during                            retroflexion. The hemorrhoids were small and Grade                            I (internal hemorrhoids that do not prolapse).                           The exam was otherwise without abnormality on                            direct and retroflexion views. Complications:            No immediate complications. Estimated blood loss:                            None. Estimated Blood Loss:     Estimated blood loss: none. Impression:               - A tattoo was seen in the ascending colon. A                            post-polypectomy scar was found at the tattoo site.                           - One 7 mm polyp in the transverse colon, removed                            with a cold snare. Resected and retrieved.                            - Internal hemorrhoids.                           - The examination was otherwise normal on direct                            and retroflexion views. Recommendation:           - Consider repeat colonoscopy vs no repeat due to                            age after studies are complete for surveillance                            based on pathology results.                           - Patient has a contact number available for                            emergencies. The signs  and symptoms of potential                            delayed complications were discussed with the                            patient. Return to normal activities tomorrow.                            Written discharge instructions were provided to the                            patient.                           - Resume previous diet.                           - Continue present medications.                           - Await pathology results. Ladene Artist, MD 04/22/2021 2:03:40 PM This report has been signed electronically.

## 2021-04-22 NOTE — Op Note (Signed)
[facility] Patient Name: [name] Procedure Date: 04/22/2021 1:43 PM MRN: [id number] Endoscopist: Ladene Artist , MD Age: 75 Referring MD:  Date of Birth: [birth year] Gender: Female Account #: [id number] Procedure:                Upper GI endoscopy Indications:              Epigastric abdominal pain, Gastroesophageal reflux                            disease Medicines:                Monitored Anesthesia Care Procedure:                Pre-Anesthesia Assessment:                           - Prior to the procedure, a History and Physical                            was performed, and patient medications and                            allergies were reviewed. The patient's tolerance of                            previous anesthesia was also reviewed. The risks                            and benefits of the procedure and the sedation                            options and risks were discussed with the patient.                            All questions were answered, and informed consent                            was obtained. Prior Anticoagulants: The patient has                            taken no previous anticoagulant or antiplatelet                            agents. ASA Grade Assessment: II - A patient with                            mild systemic disease. After reviewing the risks                            and benefits, the patient was deemed in                            satisfactory condition to undergo the procedure.  After obtaining informed consent, the endoscope was                            passed under direct vision. Throughout the                            procedure, the patient's blood pressure, pulse, and                            oxygen saturations were monitored continuously. The                            GIF HQ190 #[id number] was introduced through the                            mouth, and advanced to the second part of  duodenum.                            The upper GI endoscopy was accomplished without                            difficulty. The patient tolerated the procedure                            well. Scope In: Scope Out: Findings:                 The examined esophagus was normal.                           Patchy mildly erythematous mucosa without bleeding                            was found in the gastric fundus and in the gastric                            body. Biopsies were taken with a cold forceps for                            histology.                           A small hiatal hernia was present.                           The exam of the stomach was otherwise normal.                           A single 8 mm mucosal nodule with a localized                            distribution was found in the duodenal bulb.                            Biopsies were taken with a cold  forceps for                            histology.                           The exam of the duodenum was otherwise normal. Complications:            No immediate complications. Estimated Blood Loss:     Estimated blood loss was minimal. Impression:               - Normal esophagus.                           - Erythematous mucosa in the gastric fundus and                            gastric body. Biopsied.                           - Small hiatal hernia.                           - Mucosal nodule found in the duodenum. Biopsied. Recommendation:           - Patient has a contact number available for                            emergencies. The signs and symptoms of potential                            delayed complications were discussed with the                            patient. Return to normal activities tomorrow.                            Written discharge instructions were provided to the                            patient.                           - Resume previous diet.                           - Follow  antireflux measures.                           - Continue present medications.                           - Await pathology results. Ladene Artist, MD 04/22/2021 2:16:17 PM This report has been signed electronically.

## 2021-04-22 NOTE — Progress Notes (Signed)
Sedate, gd SR, tolerated procedure well, VSS, report to RN 

## 2021-04-24 ENCOUNTER — Telehealth: Payer: Self-pay

## 2021-04-24 NOTE — Telephone Encounter (Signed)
I have attempted without success to contact this patient by phone to follow up. Unable to leave msg.

## 2021-05-07 ENCOUNTER — Encounter: Payer: Self-pay | Admitting: Gastroenterology

## 2021-05-13 ENCOUNTER — Telehealth: Payer: Self-pay | Admitting: Gastroenterology

## 2021-05-13 NOTE — Telephone Encounter (Signed)
Patient called stating she has still not received/heard the results from the pathology done from her colonoscopy on 10/25.  Can you please call and advise?  Thank you.

## 2021-05-13 NOTE — Telephone Encounter (Signed)
I reviewed the pathology letter and results with the patient. She is notified that she will be getting a copy in the mail.  All questions answered. She will call back for any additional questions or concerns.

## 2021-06-12 ENCOUNTER — Telehealth: Payer: Self-pay | Admitting: Internal Medicine

## 2021-06-12 NOTE — Telephone Encounter (Signed)
Left message for patient to call me back at [phone] to schedule Medicare Annual Wellness Visit   Last AWV  05/10/20  Please schedule at anytime with [facility] if patient calls the office back.    40 Minutes appointment   Any questions, please call me at [phone]

## 2021-06-18 ENCOUNTER — Ambulatory Visit (INDEPENDENT_AMBULATORY_CARE_PROVIDER_SITE_OTHER): Payer: Medicare HMO

## 2021-06-18 ENCOUNTER — Other Ambulatory Visit: Payer: Self-pay

## 2021-06-18 ENCOUNTER — Encounter: Payer: Self-pay | Admitting: Gastroenterology

## 2021-06-18 DIAGNOSIS — Z0000 Encounter for general adult medical examination without abnormal findings: Secondary | ICD-10-CM

## 2021-06-18 NOTE — Progress Notes (Signed)
I connected with [name] today by telephone and verified that I am speaking with the correct person using two identifiers. Location patient: home Location provider: work Persons participating in the virtual visit: patient, provider.   I discussed the limitations, risks, security and privacy concerns of performing an evaluation and management service by telephone and the availability of in person appointments. I also discussed with the patient that there may be a patient responsible charge related to this service. The patient expressed understanding and verbally consented to this telephonic visit.    Interactive audio and video telecommunications were attempted between this provider and patient, however failed, due to patient having technical difficulties OR patient did not have access to video capability.  We continued and completed visit with audio only.  Some vital signs may be absent or patient reported.   Time Spent with patient on telephone encounter: 40 minutes  Subjective:   [name] is a 75 y.o. female who presents for Medicare Annual (Subsequent) preventive examination.  Review of Systems     Cardiac Risk Factors include: advanced age (>30men, >75 women);dyslipidemia;family history of premature cardiovascular disease;hypertension     Objective:    There were no vitals filed for this visit. There is no height or weight on file to calculate BMI.  Advanced Directives 06/18/2021 05/10/2020 07/26/2016 07/18/2015 11/19/2012 08/15/2012  Does Patient Have a Medical Advance Directive? No No No Yes Patient does not have advance directive;Patient would not like information Patient does not have advance directive;Patient would not like information  Copy of [place] in Chart? - - - Yes - -  Would patient like information on creating a medical advance directive? No - Patient declined No - Patient declined No - Patient declined - - -  Pre-existing out of facility  DNR order (yellow form or pink MOST form) - - - - No No    Current Medications (verified) Outpatient Encounter Medications as of 06/18/2021  Medication Sig   acetaminophen (TYLENOL) 500 MG tablet Take 500-750 mg by mouth every 6 (six) hours as needed for pain.   alum & mag hydroxide-simeth (MAALOX/MYLANTA) 200-200-20 MG/5ML suspension Take 15 mLs by mouth as needed for indigestion or heartburn.   esomeprazole (NEXIUM) 40 MG capsule Take 1 capsule (40 mg total) by mouth daily.   estradiol (ESTRACE) 0.1 MG/GM vaginal cream Place 1 Applicatorful vaginally 3 (three) times a week.   famotidine (PEPCID) 20 MG tablet TAKE 1 TABLET BY MOUTH TWICE A DAY   fluticasone (FLONASE) 50 MCG/ACT nasal spray Place 2 sprays into both nostrils daily. (Patient taking differently: Place 2 sprays into both nostrils daily as needed.)   hydrochlorothiazide (HYDRODIURIL) 12.5 MG tablet Take 1 tablet (12.5 mg total) by mouth daily.   Iron-Vitamins (S.S.S. TONIC PO) Take by mouth.   Multiple Vitamin (MULTIVITAMIN) tablet Take 1 tablet by mouth 3 (three) times a week.   OVER THE COUNTER MEDICATION Place 1 suppository rectally daily as needed (for hemmorhoids).   rosuvastatin (CRESTOR) 20 MG tablet TAKE 1 TABLET BY MOUTH THREE TIMES A WEEK   VITAMIN D PO Take 1 tablet by mouth daily.   No facility-administered encounter medications on file as of 06/18/2021.    Allergies (verified) Zocor [simvastatin], Advil [ibuprofen], Bran [wheat bran], Hydrocodone, Hydrocodone-acetaminophen, Lipitor [atorvastatin], and Pravachol [pravastatin sodium]   History: Past Medical History:  Diagnosis Date   Arthritis    KNEES   Blood transfusion without reported diagnosis    Cataract    CTS (  carpal tunnel syndrome)    BILATERAL LEFT > RIGHT (OCC. HAND NUMBNESS)   Female cystocele    GERD (gastroesophageal reflux disease)    H/O hiatal hernia    History of cervical dysplasia    CIN II   History of peritonitis    2006--  S/P  PERFORATED BOWEL INJURY DURING SURGERY   Hyperlipidemia    Hypertension    Past Surgical History:  Procedure Laterality Date   ABDOMINAL HYSTERECTOMY  1991   W/ BILATERAL SALPINGOOPHORECTOMY   BREAST BIOPSY     LAPAROSCOPIC CHOLECYSTECTOMY  2006   W/ PERFERATED BOWEL (INJURY)   NASAL SEPTUM SURGERY  70's   REPAIR DEVIATED SEPTUM   sepsis after gallbladder surgery     TUBAL LIGATION  1980'S   VAGINAL PROLAPSE REPAIR N/A 08/15/2012   Procedure: VAGINAL VAULT SUSPENSION;  Surgeon: Ailene Rud, MD;  Location: [facility];  Service: Urology;  Laterality: N/A;  ANTERIOR VAULT REPAIR WITH XENFORE   Family History  Problem Relation Age of Onset   Coronary artery disease Mother    Other Mother        cva   Hypertension Mother    Heart disease Mother    Stroke Father    Breast cancer Sister    Pancreatic cancer Brother    Aneurysm Brother        abdominal    Breast cancer Sister    Diabetes Other    Coronary artery disease Other    Hypertension Other    Stroke Other    Cancer Other        breast   Prostate cancer Brother    Colon cancer Neg Hx    Esophageal cancer Neg Hx    Stomach cancer Neg Hx    Liver disease Neg Hx    Social History   Socioeconomic History   Marital status: Married    Spouse name: Not on file   Number of children: Not on file   Years of education: Not on file   Highest education level: Not on file  Occupational History   Not on file  Tobacco Use   Smoking status: Never   Smokeless tobacco: Never  Vaping Use   Vaping Use: Never used  Substance and Sexual Activity   Alcohol use: No    Alcohol/week: 0.0 standard drinks    Comment: no   Drug use: No   Sexual activity: Not Currently    Birth control/protection: Surgical  Other Topics Concern   Not on file  Social History Narrative   Married '68. 2 son - '69, '70; 1 son - '71: 2 grandchildren. 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   Social Determinants of  Health   Financial Resource Strain: Low Risk    Difficulty of Paying Living Expenses: Not hard at all  Food Insecurity: No Food Insecurity   Worried About Charity fundraiser in the Last Year: Never true   [place] in the Last Year: Never true  Transportation Needs: No Transportation Needs   Lack of Transportation (Medical): No   Lack of Transportation (Non-Medical): No  Physical Activity: Sufficiently Active   Days of Exercise per Week: 5 days   Minutes of Exercise per Session: 30 min  Stress: No Stress Concern Present   Feeling of Stress : Not at all  Social Connections: Moderately Integrated   Frequency of Communication with Friends and Family: More than three times a week   Frequency of Social  Gatherings with Friends and Family: Once a week   Attends Religious Services: More than 4 times per year   Active Member of Clubs or Organizations: Yes   Attends Archivist Meetings: More than 4 times per year   Marital Status: Widowed    Tobacco Counseling Counseling given: Not Answered   Clinical Intake:  Pre-visit preparation completed: Yes  Pain : No/denies pain     Nutritional Risks: None Diabetes: No  How often do you need to have someone help you when you read instructions, pamphlets, or other written materials from your doctor or pharmacy?: 1 - Never What is the last grade level you completed in school?: High School Graduate  Diabetic? no  Interpreter Needed?: No  Information entered by :: Lisette Abu, LPN   Activities of Daily Living In your present state of health, do you have any difficulty performing the following activities: 06/18/2021  Hearing? N  Vision? N  Difficulty concentrating or making decisions? N  Walking or climbing stairs? N  Dressing or bathing? N  Doing errands, shopping? N  Preparing Food and eating ? N  Using the Toilet? N  In the past six months, have you accidently leaked urine? N  Do you have problems with loss  of bowel control? N  Managing your Medications? N  Managing your Finances? N  Housekeeping or managing your Housekeeping? N  Some recent data might be hidden    Patient Care Team: Hoyt Koch, MD as PCP - General (Internal Medicine) Charlton Haws, [facility] as Pharmacist (Pharmacist) Katy Apo, MD as Consulting Physician (Ophthalmology)  Indicate any recent Medical Services you may have received from other than [facility] providers in the past year (date may be approximate).     Assessment:   This is a routine wellness examination for [name].  Hearing/Vision screen Hearing Screening - Comments:: Patient denied any hearing difficulty.   No hearing aids.  Vision Screening - Comments:: Patient wears readers only.  Annual eye exam done by Dr. Katy Apo.  Dietary issues and exercise activities discussed: Current Exercise Habits: Home exercise routine, Type of exercise: walking, Time (Minutes): 30, Frequency (Times/Week): 5, Weekly Exercise (Minutes/Week): 150, Intensity: Moderate, Exercise limited by: None identified   Goals Addressed               This Visit's Progress     Patient Stated (pt-stated)        My goal is to stay healthy as much as I can.      Depression Screen PHQ 2/9 Scores 06/18/2021 06/18/2021 05/10/2020 03/30/2019 05/16/2018 09/30/2016 07/18/2015  PHQ - 2 Score 0 0 0 0 0 0 0    Fall Risk Fall Risk  06/18/2021 05/10/2020 03/30/2019 11/30/2017 10/29/2017  Falls in the past year? 0 0 0 No No  Number falls in past yr: 0 0 - - -  Injury with Fall? 0 0 - - -  Risk for fall due to : No Fall Risks No Fall Risks - - -  Follow up Falls prevention discussed Falls evaluation completed - - -    FALL RISK PREVENTION PERTAINING TO THE HOME:  Any stairs in or around the home? Yes  If so, are there any without handrails? No  Home free of loose throw rugs in walkways, pet beds, electrical cords, etc? Yes  Adequate lighting in your home to reduce risk of falls?  Yes   ASSISTIVE DEVICES UTILIZED TO PREVENT FALLS:  Life alert? No  Use of  a cane, walker or w/c? No  Grab bars in the bathroom? No  Shower chair or bench in shower? Yes  Elevated toilet seat or a handicapped toilet? Yes   TIMED UP AND GO:  Was the test performed? No .  Length of time to ambulate 10 feet: n/a sec.   Gait steady and fast without use of assistive device  Cognitive Function: Normal cognitive status assessed by direct observation by this Nurse Health Advisor. No abnormalities found.       6CIT Screen 05/10/2020  What Year? 0 points  What month? 0 points  What time? 0 points  Count back from 20 0 points  Months in reverse 0 points  Repeat phrase 0 points  Total Score 0    Immunizations Immunization History  Administered Date(s) Administered   Fluad Quad(high Dose 65+) 03/30/2019, 03/29/2020   Influenza Split 04/23/2011   Influenza Whole 05/02/2012   Influenza, High Dose Seasonal PF 02/26/2016, 05/14/2017, 04/12/2018, 03/27/2021   Influenza,inj,Quad PF,6+ Mos 05/10/2013, 06/08/2014, 05/03/2015   PFIZER Comirnaty(Gray Top)Covid-19 Tri-Sucrose Vaccine 11/13/2020   PFIZER(Purple Top)SARS-COV-2 Vaccination 08/11/2019, 09/02/2019, 04/27/2020   Pfizer Covid-19 Vaccine Bivalent Booster 74yrs & up 05/13/2021   Pneumococcal Conjugate-13 05/03/2015   Pneumococcal Polysaccharide-23 11/19/2010   Tdap 11/19/2010    TDAP status: Due, Education has been provided regarding the importance of this vaccine. Advised may receive this vaccine at local pharmacy or Health Dept. Aware to provide a copy of the vaccination record if obtained from local pharmacy or Health Dept. Verbalized acceptance and understanding.  Flu Vaccine status: Up to date  Pneumococcal vaccine status: Up to date  Covid-19 vaccine status: Completed vaccines  Qualifies for Shingles Vaccine? Yes   Zostavax completed No   Shingrix Completed?: No.    Education has been provided regarding the importance  of this vaccine. Patient has been advised to call insurance company to determine out of pocket expense if they have not yet received this vaccine. Advised may also receive vaccine at local pharmacy or Health Dept. Verbalized acceptance and understanding.  Screening Tests Health Maintenance  Topic Date Due   Zoster Vaccines- Shingrix (1 of 2) Never done   TETANUS/TDAP  11/18/2020   COLONOSCOPY (Pts 45-93yrs Insurance coverage will need to be confirmed)  04/22/2026   Pneumonia Vaccine 5+ Years old  Completed   INFLUENZA VACCINE  Completed   DEXA SCAN  Completed   COVID-19 Vaccine  Completed   Hepatitis C Screening  Completed   HPV VACCINES  Aged Out    Health Maintenance  Health Maintenance Due  Topic Date Due   Zoster Vaccines- Shingrix (1 of 2) Never done   TETANUS/TDAP  11/18/2020    Colorectal cancer screening: Type of screening: Colonoscopy. Completed 04/22/2021. Repeat every 5 years  Mammogram status: Completed 08/20/2020. Repeat every year  Bone Density status: Completed 07/23/2015. Results reflect: Bone density results: OSTEOPENIA. Repeat every 2-3 years.  Lung Cancer Screening: (Low Dose CT Chest recommended if Age 50-80 years, 30 pack-year currently smoking OR have quit w/in 15years.) does not qualify.   Lung Cancer Screening Referral: no  Additional Screening:  Hepatitis C Screening: does qualify; Completed yes  Vision Screening: Recommended annual ophthalmology exams for early detection of glaucoma and other disorders of the eye. Is the patient up to date with their annual eye exam?  Yes  Who is the provider or what is the name of the office in which the patient attends annual eye exams? Dr. Katy Apo If pt is not  established with a provider, would they like to be referred to a provider to establish care? No .   Dental Screening: Recommended annual dental exams for proper oral hygiene  Community Resource Referral / Chronic Care Management: CRR required this  visit?  No   CCM required this visit?  No      Plan:     I have personally reviewed and noted the following in the patients chart:   Medical and social history Use of alcohol, tobacco or illicit drugs  Current medications and supplements including opioid prescriptions.  Functional ability and status Nutritional status Physical activity Advanced directives List of other physicians Hospitalizations, surgeries, and ER visits in previous 12 months Vitals Screenings to include cognitive, depression, and falls Referrals and appointments  In addition, I have reviewed and discussed with patient certain preventive protocols, quality metrics, and best practice recommendations. A written personalized care plan for preventive services as well as general preventive health recommendations were provided to patient.     Sheral Flow, LPN   [id number]   Nurse Notes:  Patient is cogitatively intact. There were no vitals filed for this visit. There is no height or weight on file to calculate BMI. Patient stated that she has no issues with gait or balance; does not use any assistive devices. Medications reviewed with patient; no opioid use noted. Hearing Screening - Comments:: Patient denied any hearing difficulty.   No hearing aids.  Vision Screening - Comments:: Patient wears readers only.  Annual eye exam done by Dr. Katy Apo.

## 2021-06-24 ENCOUNTER — Encounter: Payer: Self-pay | Admitting: Radiology

## 2021-07-01 ENCOUNTER — Telehealth: Payer: Self-pay | Admitting: Internal Medicine

## 2021-07-01 NOTE — Telephone Encounter (Signed)
Patient states she has had stomach pain with "gas like" cramps since 12-31  Patient is scheduled for a ov on 1-6  Patient is requesting a call back to discuss symptoms

## 2021-07-04 ENCOUNTER — Other Ambulatory Visit: Payer: Self-pay

## 2021-07-04 ENCOUNTER — Ambulatory Visit (INDEPENDENT_AMBULATORY_CARE_PROVIDER_SITE_OTHER): Payer: Medicare HMO | Admitting: Internal Medicine

## 2021-07-04 ENCOUNTER — Encounter: Payer: Self-pay | Admitting: Internal Medicine

## 2021-07-04 DIAGNOSIS — A084 Viral intestinal infection, unspecified: Secondary | ICD-10-CM | POA: Insufficient documentation

## 2021-07-04 NOTE — Progress Notes (Signed)
° °  Subjective:   Patient ID: [name], female    DOB: March 28, 1946, 76 y.o.   MRN: [id number]  Abdominal Pain Associated symptoms include diarrhea and nausea. Pertinent negatives include no constipation or vomiting.  The patient is a 76 YO female coming in for stomach pain, diarrhea. Started about 1 week ago and lasted 2 days diarrhea. Pain is better now almost gone. Some nausea no vomiting.   Review of Systems  Constitutional:  Positive for activity change and appetite change.  HENT: Negative.    Eyes: Negative.   Respiratory:  Negative for cough, chest tightness and shortness of breath.   Cardiovascular:  Negative for chest pain, palpitations and leg swelling.  Gastrointestinal:  Positive for abdominal distention, abdominal pain, diarrhea and nausea. Negative for constipation and vomiting.  Musculoskeletal: Negative.   Skin: Negative.   Neurological: Negative.   Psychiatric/Behavioral: Negative.     Objective:  Physical Exam Constitutional:      Appearance: She is well-developed.  HENT:     Head: Normocephalic and atraumatic.  Cardiovascular:     Rate and Rhythm: Normal rate and regular rhythm.  Pulmonary:     Effort: Pulmonary effort is normal. No respiratory distress.     Breath sounds: Normal breath sounds. No wheezing or rales.  Abdominal:     General: Bowel sounds are normal. There is no distension.     Palpations: Abdomen is soft.     Tenderness: There is abdominal tenderness. There is no rebound.     Comments: Mild midline tenderness, non-radiating  Musculoskeletal:     Cervical back: Normal range of motion.  Skin:    General: Skin is warm and dry.  Neurological:     Mental Status: She is alert and oriented to person, place, and time.     Coordination: Coordination normal.    Vitals:   07/04/21 0932  BP: 124/80  Pulse: 94  Resp: 18  SpO2: 95%  Weight: 138 lb 3.2 oz (62.7 kg)  Height: 4\' 10"  (1.473 m)    This visit occurred during the SARS-CoV-2 public  health emergency.  Safety protocols were in place, including screening questions prior to the visit, additional usage of staff PPE, and extensive cleaning of exam room while observing appropriate contact time as indicated for disinfecting solutions.   Assessment & Plan:  Visit time 20 minutes in face to face communication with patient and coordination of care, additional 5 minutes spent in record review, coordination or care, ordering tests, communicating/referring to other healthcare professionals, documenting in medical records all on the same day of the visit for total time 25 minutes spent on the visit.

## 2021-07-04 NOTE — Assessment & Plan Note (Signed)
Overall resolving. Discussed brat diet and given information about same. Okay to use tums. Start with small meals and make sure hydrated.

## 2021-07-04 NOTE — Patient Instructions (Signed)
This was likely a GI bug and should continue to get better.

## 2021-07-21 ENCOUNTER — Telehealth: Payer: Medicare HMO

## 2021-07-22 ENCOUNTER — Other Ambulatory Visit: Payer: Self-pay | Admitting: Internal Medicine

## 2021-07-22 DIAGNOSIS — Z1231 Encounter for screening mammogram for malignant neoplasm of breast: Secondary | ICD-10-CM

## 2021-07-31 ENCOUNTER — Telehealth: Payer: Self-pay

## 2021-07-31 NOTE — Telephone Encounter (Signed)
NOTE NOT NEEDED  Pt schedule an appt.Marland KitchenMarland Kitchen

## 2021-08-04 ENCOUNTER — Ambulatory Visit (INDEPENDENT_AMBULATORY_CARE_PROVIDER_SITE_OTHER): Payer: Medicare HMO | Admitting: Internal Medicine

## 2021-08-04 ENCOUNTER — Other Ambulatory Visit: Payer: Self-pay

## 2021-08-04 ENCOUNTER — Encounter: Payer: Self-pay | Admitting: Internal Medicine

## 2021-08-04 VITALS — BP 124/68 | HR 78 | Resp 18 | Ht <= 58 in | Wt 134.6 lb

## 2021-08-04 DIAGNOSIS — R309 Painful micturition, unspecified: Secondary | ICD-10-CM

## 2021-08-04 DIAGNOSIS — R350 Frequency of micturition: Secondary | ICD-10-CM | POA: Insufficient documentation

## 2021-08-04 LAB — POCT URINALYSIS DIPSTICK
Bilirubin, UA: NEGATIVE
Glucose, UA: NEGATIVE
Ketones, UA: NEGATIVE
Leukocytes, UA: NEGATIVE
Nitrite, UA: NEGATIVE
Protein, UA: NEGATIVE
Spec Grav, UA: <=1.005 — AB (ref 1.010–1.025)
Urobilinogen, UA: 0.2 E.U./dL
pH, UA: 6.5 (ref 5.0–8.0)

## 2021-08-04 MED ORDER — NITROFURANTOIN MONOHYD MACRO 100 MG PO CAPS: 100.0000 mg | ORAL_CAPSULE | Freq: Two times a day (BID) | ORAL | 0 refills | Status: DC

## 2021-08-04 NOTE — Patient Instructions (Signed)
We have sent in macrobid to take 1 pill twice a day for 5 days to clear up the urine.

## 2021-08-04 NOTE — Assessment & Plan Note (Signed)
Suspect acute cystitis. POC U/A no signs of infection. She is still having symptoms likely due to partially treated with amoxicillin. Will do 5 day course macrobid.

## 2021-08-04 NOTE — Progress Notes (Signed)
° °  Subjective:   Patient ID: [name], female    DOB: May 19, 1946, 76 y.o.   MRN: [id number]  Urinary Frequency  Associated symptoms include frequency. Pertinent negatives include no nausea or vomiting.  The patient is a 76 YO female coming in for possible UTI. Took a few doses of amoxicillin last week she had leftover.   Review of Systems  Constitutional: Negative.   HENT: Negative.    Eyes: Negative.   Respiratory:  Negative for cough, chest tightness and shortness of breath.   Cardiovascular:  Negative for chest pain, palpitations and leg swelling.  Gastrointestinal:  Negative for abdominal distention, abdominal pain, constipation, diarrhea, nausea and vomiting.  Genitourinary:  Positive for frequency.  Musculoskeletal: Negative.   Skin: Negative.   Neurological: Negative.   Psychiatric/Behavioral: Negative.     Objective:  Physical Exam Constitutional:      Appearance: She is well-developed.  HENT:     Head: Normocephalic and atraumatic.  Cardiovascular:     Rate and Rhythm: Normal rate and regular rhythm.  Pulmonary:     Effort: Pulmonary effort is normal. No respiratory distress.     Breath sounds: Normal breath sounds. No wheezing or rales.  Abdominal:     General: Bowel sounds are normal. There is no distension.     Palpations: Abdomen is soft.     Tenderness: There is no abdominal tenderness. There is no rebound.  Musculoskeletal:     Cervical back: Normal range of motion.  Skin:    General: Skin is warm and dry.  Neurological:     Mental Status: She is alert and oriented to person, place, and time.     Coordination: Coordination normal.    Vitals:   08/04/21 0837  BP: 124/68  Pulse: 78  Resp: 18  SpO2: 98%  Weight: 134 lb 9.6 oz (61.1 kg)  Height: 4\' 10"  (1.473 m)    This visit occurred during the SARS-CoV-2 public health emergency.  Safety protocols were in place, including screening questions prior to the visit, additional usage of staff PPE, and  extensive cleaning of exam room while observing appropriate contact time as indicated for disinfecting solutions.   Assessment & Plan:

## 2021-08-21 ENCOUNTER — Ambulatory Visit
Admission: RE | Admit: 2021-08-21 | Discharge: 2021-08-21 | Disposition: A | Discharge status: Home / Self Care | Payer: Medicare HMO | Source: Ambulatory Visit | Attending: Internal Medicine | Admitting: Internal Medicine

## 2021-08-21 DIAGNOSIS — Z1231 Encounter for screening mammogram for malignant neoplasm of breast: Secondary | ICD-10-CM | POA: Diagnosis not present

## 2021-09-03 ENCOUNTER — Ambulatory Visit (INDEPENDENT_AMBULATORY_CARE_PROVIDER_SITE_OTHER): Payer: Medicare HMO

## 2021-09-03 DIAGNOSIS — E782 Mixed hyperlipidemia: Secondary | ICD-10-CM

## 2021-09-03 DIAGNOSIS — I10 Essential (primary) hypertension: Secondary | ICD-10-CM

## 2021-09-03 DIAGNOSIS — K2100 Gastro-esophageal reflux disease with esophagitis, without bleeding: Secondary | ICD-10-CM

## 2021-09-03 NOTE — Patient Instructions (Signed)
Visit Information ? ?Following are the goals we discussed today:  ? ?Manage My Medicine  ? ?Timeframe:  Long-Range Goal ?Priority:  Medium ?Start Date: 09/03/2021                            ?Expected End Date:  09/04/2022                    ? ?Follow Up Date 05/06/2022  ?  ?- call for medicine refill 2 or 3 days before it runs out ?- call if I am sick and can't take my medicine ?- keep a list of all the medicines I take; vitamins and herbals too ?- learn to read medicine labels  ?  ?Why is this important?   ?These steps will help you keep on track with your medicines. ? ?Plan: Telephone follow up appointment with care management team member scheduled for:  6 months  ?The patient has been provided with contact information for the care management team and has been advised to call with any health related questions or concerns.  ? ?Tomasa Blase, PharmD ?Clinical Pharmacist, [facility]  ? ?Please call the care guide team at [phone] if you need to cancel or reschedule your appointment.  ? ?Patient verbalizes understanding of instructions and care plan provided today and agrees to view in [place]. Active MyChart status confirmed with patient.   ? ?

## 2021-09-03 NOTE — Progress Notes (Signed)
Chronic Care Management Pharmacy Note  09/03/2021 Name:  [name] MRN:  [id number] DOB:  [birth year]  Summary: -Patient reports that she has been doing well since last appointment, reports compliance with her medications, denies any issues  -Checks BP at home on occasion - could only recall her last reading which was 130/85 - last office readings have been well controlled and in range   Recommendations/Changes made from today's visit: -recommending no changes to medications at this time, advised for patient to receive shingrix and tdap vaccinations with next visit to pharmacy   Plan: -F/u in 6-8 months   Subjective: [name] is an 76 y.o. year old female who is a primary patient of Hoyt Koch, MD.  The CCM team was consulted for assistance with disease management and care coordination needs.    Engaged with patient by telephone for follow up visit in response to provider referral for pharmacy case management and/or care coordination services.   Consent to Services:  The patient was given the following information about Chronic Care Management services today, agreed to services, and gave verbal consent: 1. CCM service includes personalized support from designated clinical staff supervised by the primary care provider, including individualized plan of care and coordination with other care providers 2. 24/7 contact phone numbers for assistance for urgent and routine care needs. 3. Service will only be billed when office clinical staff spend 20 minutes or more in a month to coordinate care. 4. Only one practitioner may furnish and bill the service in a calendar month. 5.The patient may stop CCM services at any time (effective at the end of the month) by phone call to the office staff. 6. The patient will be responsible for cost sharing (co-pay) of up to 20% of the service fee (after annual deductible is met). Patient agreed to services and consent obtained.  Patient Care  Team: Hoyt Koch, MD as PCP - General (Internal Medicine) Katy Apo, MD as Consulting Physician (Ophthalmology) Tomasa Blase, [facility] (Pharmacist)  Recent office visits: 08/04/2021 - Dr. Sharlet Salina - frequent urination / pain with urination - rx'd macrobid  07/04/2021 - Dr. Sharlet Salina - Viral Gastroenteritis - no changes to medications  04/07/2021 - Dr. Sharlet Salina - no changes to medications - pt stable  Recent consult visits: 03/19/2021 - Dr. Fuller Plan Gertie Fey  - begin miralax as needed for daily BM - scheduled endoscopy   Hospital visits: None in previous 6 months  Objective:  Lab Results  Component Value Date   CREATININE 0.72 04/07/2021   BUN 14 04/07/2021   GFR 81.74 04/07/2021   GFRNONAA 90 (L) 11/21/2012   GFRAA >90 11/21/2012   NA 139 04/07/2021   K 3.7 04/07/2021   CALCIUM 9.8 04/07/2021   CO2 30 04/07/2021   GLUCOSE 100 (H) 04/07/2021    Lab Results  Component Value Date/Time   HGBA1C 6.2 04/07/2021 10:05 AM   HGBA1C 6.1 05/16/2018 03:02 PM   GFR 81.74 04/07/2021 10:05 AM   GFR 71.42 05/10/2020 10:28 AM    Last diabetic Eye exam:  No results found for: HMDIABEYEEXA  Last diabetic Foot exam:  No results found for: HMDIABFOOTEX   Lab Results  Component Value Date   CHOL 190 04/07/2021   HDL 57.10 04/07/2021   LDLCALC 109 (H) 04/07/2021   LDLDIRECT 175.3 04/24/2008   TRIG 120.0 04/07/2021   CHOLHDL 3 04/07/2021    Hepatic Function Latest Ref Rng & Units 04/07/2021 05/10/2020 03/30/2019  Total Protein  6.0 - 8.3 g/dL 6.9 6.9 7.2  Albumin 3.5 - 5.2 g/dL 4.2 4.2 4.3  AST 0 - 37 U/L $Remo'26 24 22  'HtWXL$ ALT 0 - 35 U/L $Remo'22 22 23  'ueOPQ$ Alk Phosphatase 39 - 117 U/L 58 56 55  Total Bilirubin 0.2 - 1.2 mg/dL 1.0 1.2 0.9  Bilirubin, Direct 0.0 - 0.3 mg/dL - - -    Lab Results  Component Value Date/Time   TSH 1.74 05/16/2018 03:02 PM   TSH 1.87 01/22/2014 03:32 PM    CBC Latest Ref Rng & Units 04/07/2021 05/10/2020 03/30/2019  WBC 4.0 - 10.5 K/uL 4.7 6.3 6.2   Hemoglobin 12.0 - 15.0 g/dL 13.7 13.3 13.7  Hematocrit 36.0 - 46.0 % 40.5 40.0 40.8  Platelets 150.0 - 400.0 K/uL 286.0 288.0 310.0    No results found for: VD25OH  Clinical ASCVD: No  The 10-year ASCVD risk score (Arnett DK, et al., 2019) is: 14.2%   Values used to calculate the score:     Age: 73 years     Sex: Female     Is Non-Hispanic African American: Yes     Diabetic: No     Tobacco smoker: No     Systolic Blood Pressure: 161 mmHg     Is BP treated: Yes     HDL Cholesterol: 57.1 mg/dL     Total Cholesterol: 190 mg/dL    Depression screen [facility] 2/9 06/18/2021 06/18/2021 05/10/2020  Decreased Interest 0 0 0  Down, Depressed, Hopeless 0 0 0  PHQ - 2 Score 0 0 0    Social History   Tobacco Use  Smoking Status Never  Smokeless Tobacco Never   BP Readings from Last 3 Encounters:  08/04/21 124/68  07/04/21 124/80  04/22/21 109/84   Pulse Readings from Last 3 Encounters:  08/04/21 78  07/04/21 94  04/22/21 77   Wt Readings from Last 3 Encounters:  08/04/21 134 lb 9.6 oz (61.1 kg)  07/04/21 138 lb 3.2 oz (62.7 kg)  04/22/21 140 lb (63.5 kg)   BMI Readings from Last 3 Encounters:  08/04/21 28.13 kg/m  07/04/21 28.88 kg/m  04/22/21 29.26 kg/m    Assessment/Interventions: Review of patient past medical history, allergies, medications, health status, including review of consultants reports, laboratory and other test data, was performed as part of comprehensive evaluation and provision of chronic care management services.   SDOH:  (Social Determinants of Health) assessments and interventions performed: Yes  SDOH Screenings   Alcohol Screen: Low Risk    Last Alcohol Screening Score (AUDIT): 0  Depression (PHQ2-9): Low Risk    PHQ-2 Score: 0  Financial Resource Strain: Low Risk    Difficulty of Paying Living Expenses: Not hard at all  Food Insecurity: No Food Insecurity   Worried About Charity fundraiser in the Last Year: Never true   Ran Out of Food in  the Last Year: Never true  Housing: Low Risk    Last Housing Risk Score: 0  Physical Activity: Sufficiently Active   Days of Exercise per Week: 5 days   Minutes of Exercise per Session: 30 min  Social Connections: Moderately Integrated   Frequency of Communication with Friends and Family: More than three times a week   Frequency of Social Gatherings with Friends and Family: Once a week   Attends Religious Services: More than 4 times per year   Active Member of [employer] or Organizations: Yes   Attends Archivist Meetings: More than 4 times per  year   Marital Status: Widowed  Stress: No Stress Concern Present   Feeling of Stress : Not at all  Tobacco Use: Low Risk    Smoking Tobacco Use: Never   Smokeless Tobacco Use: Never   Passive Exposure: Not on file  Transportation Needs: No Transportation Needs   Lack of Transportation (Medical): No   Lack of Transportation (Non-Medical): No    CCM Care Plan  Allergies  Allergen Reactions   Zocor [Simvastatin]     12/2013 CK 528   Advil [Ibuprofen] Swelling    Tongue swells   Bran [Wheat Bran] Other (See Comments)    Very bad GI pain   Hydrocodone Nausea Only and Other (See Comments)     TACHYCARDIA   Hydrocodone-Acetaminophen Nausea Only    Other reaction(s): Increased Heart Rate (intolerance)   Lipitor [Atorvastatin] Other (See Comments)    Bad dreams   Pravachol [Pravastatin Sodium] Other (See Comments)    Bad dreams    Medications Reviewed Today     Reviewed by Tomasa Blase, [facility] (Pharmacist) on 09/03/21 at 1355  Med List Status: <None>   Medication Order Taking? Sig Documenting Provider Last Dose Status Informant  acetaminophen (TYLENOL) 500 MG tablet [id number] Yes Take 500-750 mg by mouth every 6 (six) hours as needed for pain. [provider] Taking Active Self  alum & mag hydroxide-simeth (MAALOX/MYLANTA) 200-200-20 MG/5ML suspension [id number] Yes Take 15 mLs by mouth as needed for indigestion or  heartburn. [provider] Taking Active   esomeprazole (NEXIUM) 40 MG capsule [id number] Yes Take 1 capsule (40 mg total) by mouth daily. Hoyt Koch, MD Taking Active            Med Note Rolan Bucco Aug 31, 2019 12:15 PM) Using OTC Nexium due tolerability issues with Rx version  estradiol (ESTRACE) 0.1 MG/GM vaginal cream [id number] Yes Place 1 Applicatorful vaginally 3 (three) times a week. Caren Macadam, MD Taking Active   famotidine (PEPCID) 20 MG tablet [id number] Yes TAKE 1 TABLET BY MOUTH TWICE A DAY  Patient taking differently: Take 20 mg by mouth daily as needed.   Hoyt Koch, MD Taking Active   fluticasone [facility]) 50 MCG/ACT nasal spray [id number] Yes Place 2 sprays into both nostrils daily.  Patient taking differently: Place 2 sprays into both nostrils daily as needed.   Hoyt Koch, MD Taking Active   hydrochlorothiazide (HYDRODIURIL) 12.5 MG tablet [id number] Yes Take 1 tablet (12.5 mg total) by mouth daily. Hoyt Koch, MD Taking Active   Iron-Vitamins (S.S.S. TONIC PO) [id number] Yes Take 1 tablet by mouth once a week. [provider] Taking Active   Multiple Vitamin (MULTIVITAMIN) tablet [id number] Yes Take 1 tablet by mouth 3 (three) times a week. [provider] Taking Active Self  OVER THE COUNTER MEDICATION [id number] Yes Place 1 suppository rectally daily as needed (for hemmorhoids). [place] [provider] Taking Active Self  rosuvastatin (CRESTOR) 20 MG tablet [id number] Yes TAKE 1 TABLET BY MOUTH THREE TIMES A WEEK Hoyt Koch, MD Taking Active   VITAMIN D PO [id number] Yes Take 1 tablet by mouth daily. [provider] Taking Active             Patient Active Problem List   Diagnosis Date Noted   Frequent urination 08/04/2021   Viral gastroenteritis 07/04/2021   Persistent shortness of breath after COVID-19 08/06/2020   Arthritis of left acromioclavicular  joint 04/04/2019   Right  hip pain 03/30/2019   Rotator cuff arthropathy of left shoulder 03/24/2017   Essential hypertension 10/03/2014   Impingement syndrome of left shoulder region 10/03/2014   Routine general medical examination at a health care facility 08/27/2014   Elevated CK 02/02/2014   Impaired fasting glucose 02/17/2012   GERD 03/17/2009   Hyperlipidemia 02/11/2009   Left knee pain 02/11/2009    Immunization History  Administered Date(s) Administered   Fluad Quad(high Dose 65+) 03/30/2019, 03/29/2020, 03/27/2021   Influenza Split 04/23/2011   Influenza Whole 05/02/2012   Influenza, High Dose Seasonal PF 02/26/2016, 05/14/2017, 04/12/2018, 03/27/2021   Influenza,inj,Quad PF,6+ Mos 05/10/2013, 06/08/2014, 05/03/2015   PFIZER Comirnaty(Gray Top)Covid-19 Tri-Sucrose Vaccine 11/13/2020   PFIZER(Purple Top)SARS-COV-2 Vaccination 08/11/2019, 09/02/2019, 04/27/2020   Pfizer Covid-19 Vaccine Bivalent Booster 76yrs & up 05/13/2021   Pneumococcal Conjugate-13 05/03/2015   Pneumococcal Polysaccharide-23 11/19/2010   Tdap 11/19/2010    Conditions to be addressed/monitored:  Hypertension, Hyperlipidemia, and GERD  Care Plan : [place]  Updates made by Tomasa Blase, RPH since 09/03/2021 12:00 AM     Problem: HTN, HLD, GERD   Priority: High  Onset Date: 09/03/2021     Long-Range Goal: Disease Management   Start Date: 09/03/2021  Expected End Date: 09/04/2022  This Visit's Progress: On track  Priority: High  Note:   Current Barriers:  Chronic Disease Management support, education, and care coordination needs related to Hypertension, Hyperlipidemia, and GERD   Hypertension Controlled  BP Readings from Last 3 Encounters:  08/04/21 124/68  07/04/21 124/80  04/22/21 109/84  Pharmacist Clinical Goal(s): Patient will work with PharmD and providers to maintain BP goal <130/80 Current regimen:  HCTZ 12.5 mg daily Interventions: Discussed BP goals and benefits of  medications for prevention of heart attack / stroke Patient self care activities - Patient will: Check BP at least once weekly, document, and provide at future appointments Ensure daily salt intake < 2300 mg/day  Hyperlipidemia Controlled - at goal  Lab Results  Component Value Date   [place] 109 (H) 04/07/2021  Pharmacist Clinical Goal(s): Patient will work with PharmD and providers to achieve LDL goal < 130 Current regimen:  rosuvastatin 20 mg 3x per week OTC fish oil - no longer taking due to indigestion when taking  Interventions: Discussed cholesterol goals and benefits of medications for prevention of heart attack / stroke Advised that patient could trial enteric coated fish oils to help reduce GI upset when taking  Patient self care activities - Patient will: Continue medication as prescribed  GERD Pharmacist Clinical Goal(s) Patient will work with PharmD and providers to optimize therapy Current regimen:  Esomeprazole $RemoveBefor'40mg'[id number]$  daily - uses OTC version as she tolerates better  famotidine 20 mg daily as needed  Mylanta PRN Interventions: Recommend avoiding acidic/ spicy foods to reduce incidence of GERD  Patient self care activities - Patient will: Continue medications as instructed  Medication management Pharmacist Clinical Goal(s): Patient will work with PharmD and providers to maintain optimal medication adherence Current [facility] Interventions Comprehensive medication review performed. Continue current medication management strategy Patient self care activities - Patient will: Focus on medication adherence by fill date Take medications as prescribed Report any questions or concerns to PharmD and/or provider(s)       Medication Assistance: None required.  Patient affirms current coverage meets needs.  Care Gaps: Shingrix Tdap  Patient's preferred pharmacy is:  [id number] [place], [address] [place]  [address] Phone: [phone]  Fax: [phone]   Uses pill box? No Pt endorses 100% compliance  Care Plan and Follow Up Patient Decision:  Patient agrees to Care Plan and Follow-up.  Plan: Telephone follow up appointment with care management team member scheduled for:  6-8 months  The patient has been provided with contact information for the care management team and has been advised to call with any health related questions or concerns.   Tomasa Blase, PharmD Clinical Pharmacist, [place]

## 2021-09-12 ENCOUNTER — Encounter: Payer: Self-pay | Admitting: Family Medicine

## 2021-09-12 ENCOUNTER — Telehealth (INDEPENDENT_AMBULATORY_CARE_PROVIDER_SITE_OTHER): Payer: Medicare HMO | Admitting: Family Medicine

## 2021-09-12 ENCOUNTER — Other Ambulatory Visit: Payer: Self-pay

## 2021-09-12 VITALS — Temp 99.1°F

## 2021-09-12 DIAGNOSIS — R0981 Nasal congestion: Secondary | ICD-10-CM | POA: Diagnosis not present

## 2021-09-12 DIAGNOSIS — J069 Acute upper respiratory infection, unspecified: Secondary | ICD-10-CM

## 2021-09-12 DIAGNOSIS — J3489 Other specified disorders of nose and nasal sinuses: Secondary | ICD-10-CM

## 2021-09-12 NOTE — Progress Notes (Signed)
? ?  Subjective:  ? ? Patient ID: [name], female    DOB: 01/28/1946, 75 y.o.   MRN: [id number] ? ?HPI ?Chief Complaint  ?Patient presents with  ? Cough  ?  Sx started Wednesday 3/15 ? ?COVID negative 3/16  ? nasal drainage  ? Fever  ? ? ?Complains of a 2-day history of sneezing, severe nasal congestion and rhinorrhea. ?She has had 3 negative home Covid tests.  ?States her temp yesterday was 99.1 and normal after Tylenol.  ? ?She is taking Sudafed and using Flonase without much relief.  ? ?Denies recent sinusitis or antibiotic therapy.  ?Immunocompetent.  ? ?Denies chills, body aches, dizziness, chest pain, palpitations, shortness of breath, abdominal pain, N/V/D.  ? ?Reviewed allergies, medications, past medical, surgical, family, and social history. ? ? ?Review of Systems ?Pertinent positives and negatives in the history of present illness. ? ?   ?Objective:  ? Physical Exam ?Temp 99.1 ?F (37.3 ?C)  ? ?Alert and oriented in no acute distress.  She does sound nasally congested and sniffling.  Respirations unlabored.  She is speaking in complete sentences without difficulty. ? ? ?   ?Assessment & Plan:  ?Viral upper respiratory infection ? ?Nasal congestion with rhinorrhea ? ?Discussed that she most likely has a viral URI at this point.  Discussed in depth symptomatic management.  Discussed not overlapping with over-the-counter medications if she chooses to get Mucinex sinus max.  May use Afrin sparingly for the next 2-3 days and then stop. Advised good nutrition and hydration as well as rest.  Antibiotic therapy not warranted at this time.  She will let me know if she is worsening over the next 2 to 3 days ? ?Documentation for virtual telephone encounter. ? ?Interactive audio and video telecommunications were attempted between this provider and patient, however due to patient not having video access, we continued and completed visit with audio only. ? ?The patient was located at home. ?The provider was located  in the office. ?The patient did consent to this visit and is aware of possible charges through their insurance for this visit. ? ?The other persons participating in this telemedicine service were none. ?Time spent on call was 13 minutes and in review of previous records >15 minutes total. ? ?This virtual service is not related to other E/M service within previous 7 days. ? ?[id number] (5-72mn) ?[phone](11-223m) ?[id number] ? ? ?

## 2021-09-16 ENCOUNTER — Telehealth: Payer: Self-pay | Admitting: Internal Medicine

## 2021-09-16 ENCOUNTER — Other Ambulatory Visit: Payer: Self-pay | Admitting: Family Medicine

## 2021-09-16 MED ORDER — AMOXICILLIN 875 MG PO TABS: 875.0000 mg | ORAL_TABLET | Freq: Two times a day (BID) | ORAL | 0 refills | Status: AC

## 2021-09-16 NOTE — Telephone Encounter (Signed)
Pt had a vv w/ Vickie on 3-17 ? ?Pt states she has a rx for amoxicillin giving to her by her dentist from a previous operation  ? ?Pt states she feels better but still has a sore throat, she is inquiring if she should take the amoxicillin ? ?Please advise ?

## 2021-09-16 NOTE — Telephone Encounter (Signed)
Called pt and pt states she is still experiencing sore throat, congestion and ear pressure. She has tried gargling with salt water, steam and nose spray but sx are still persisting.  ?

## 2021-09-16 NOTE — Telephone Encounter (Signed)
Pt had VV on 3/17 and since then she has gotten better but is still experiencing a sore throat. She is wondering if she should take the amoxicillin given by her dentist from a previous operation to help with her sore throat. ?

## 2021-09-16 NOTE — Telephone Encounter (Signed)
Called and let pt know prescription was sent to her pharmacy. Pt verbalized understanding ?

## 2021-09-26 DIAGNOSIS — I10 Essential (primary) hypertension: Secondary | ICD-10-CM | POA: Diagnosis not present

## 2021-09-26 DIAGNOSIS — E785 Hyperlipidemia, unspecified: Secondary | ICD-10-CM | POA: Diagnosis not present

## 2022-01-28 ENCOUNTER — Other Ambulatory Visit: Payer: Self-pay | Admitting: Internal Medicine

## 2022-02-17 ENCOUNTER — Ambulatory Visit: Payer: Medicare HMO | Admitting: Emergency Medicine

## 2022-02-19 ENCOUNTER — Ambulatory Visit (INDEPENDENT_AMBULATORY_CARE_PROVIDER_SITE_OTHER): Payer: Medicare HMO | Admitting: Internal Medicine

## 2022-02-19 ENCOUNTER — Encounter: Payer: Self-pay | Admitting: Internal Medicine

## 2022-02-19 VITALS — BP 134/86 | HR 66 | Temp 98.3°F | Ht <= 58 in | Wt 138.0 lb

## 2022-02-19 DIAGNOSIS — I10 Essential (primary) hypertension: Secondary | ICD-10-CM | POA: Diagnosis not present

## 2022-02-19 DIAGNOSIS — E782 Mixed hyperlipidemia: Secondary | ICD-10-CM

## 2022-02-19 DIAGNOSIS — R7301 Impaired fasting glucose: Secondary | ICD-10-CM

## 2022-02-19 DIAGNOSIS — R0789 Other chest pain: Secondary | ICD-10-CM

## 2022-02-19 LAB — HEMOGLOBIN A1C: Hgb A1c MFr Bld: 6.3 % (ref 4.6–6.5)

## 2022-02-19 LAB — LIPID PANEL
Cholesterol: 208 mg/dL — ABNORMAL HIGH (ref 0–200)
HDL: 63.5 mg/dL (ref >39.00)
LDL Cholesterol: 122 mg/dL — ABNORMAL HIGH (ref 0–99)
NonHDL: 144.93
Total CHOL/HDL Ratio: 3
Triglycerides: 116 mg/dL (ref 0.0–149.0)
VLDL: 23.2 mg/dL (ref 0.0–40.0)

## 2022-02-19 LAB — CBC
HCT: 41.4 % (ref 36.0–46.0)
Hemoglobin: 13.8 g/dL (ref 12.0–15.0)
MCHC: 33.3 g/dL (ref 30.0–36.0)
MCV: 91.1 fl (ref 78.0–100.0)
Platelets: 263 10*3/uL (ref 150.0–400.0)
RBC: 4.54 Mil/uL (ref 3.87–5.11)
RDW: 13.8 % (ref 11.5–15.5)
WBC: 5.6 10*3/uL (ref 4.0–10.5)

## 2022-02-19 LAB — COMPREHENSIVE METABOLIC PANEL
ALT: 22 U/L (ref 0–35)
AST: 25 U/L (ref 0–37)
Albumin: 4.2 g/dL (ref 3.5–5.2)
Alkaline Phosphatase: 54 U/L (ref 39–117)
BUN: 17 mg/dL (ref 6–23)
CO2: 32 mEq/L (ref 19–32)
Calcium: 9.9 mg/dL (ref 8.4–10.5)
Chloride: 100 mEq/L (ref 96–112)
Creatinine, Ser: 0.72 mg/dL (ref 0.40–1.20)
GFR: 81.24 mL/min (ref >60.00)
Glucose, Bld: 95 mg/dL (ref 70–99)
Potassium: 3.3 mEq/L — ABNORMAL LOW (ref 3.5–5.1)
Sodium: 140 mEq/L (ref 135–145)
Total Bilirubin: 1.1 mg/dL (ref 0.2–1.2)
Total Protein: 7.1 g/dL (ref 6.0–8.3)

## 2022-02-19 LAB — TROPONIN I (HIGH SENSITIVITY): High Sens Troponin I: 4 ng/L (ref 2–17)

## 2022-02-19 NOTE — Progress Notes (Signed)
   Subjective:   Patient ID: [name], female    DOB: 1946-03-04, 76 y.o.   MRN: [id number]  HPI The patient is a 76 YO female coming in for chest pain. Started about a week ago and lasts for a few minutes at a time. Not present now. Not with exertion just random.  Review of Systems  Constitutional: Negative.   HENT: Negative.    Eyes: Negative.   Respiratory:  Positive for chest tightness. Negative for cough and shortness of breath.   Cardiovascular:  Positive for chest pain. Negative for palpitations and leg swelling.  Gastrointestinal:  Negative for abdominal distention, abdominal pain, constipation, diarrhea, nausea and vomiting.  Musculoskeletal: Negative.   Skin: Negative.   Neurological: Negative.   Psychiatric/Behavioral: Negative.      Objective:  Physical Exam Constitutional:      Appearance: She is well-developed.  HENT:     Head: Normocephalic and atraumatic.  Cardiovascular:     Rate and Rhythm: Normal rate and regular rhythm.  Pulmonary:     Effort: Pulmonary effort is normal. No respiratory distress.     Breath sounds: Normal breath sounds. No wheezing or rales.  Chest:     Chest wall: No tenderness.  Abdominal:     General: Bowel sounds are normal. There is no distension.     Palpations: Abdomen is soft.     Tenderness: There is no abdominal tenderness. There is no rebound.  Musculoskeletal:     Cervical back: Normal range of motion.  Skin:    General: Skin is warm and dry.  Neurological:     Mental Status: She is alert and oriented to person, place, and time.     Coordination: Coordination normal.     Vitals:   02/19/22 1019  BP: 134/86  Pulse: 66  Temp: 98.3 F (36.8 C)  SpO2: 95%  Weight: 138 lb (62.6 kg)  Height: '4\' 10"'$  (1.473 m)   EKG: Rate 66, axis normal, interval normal, sinus, no st or t wave changes, no significant change compared to prior 2021   Assessment & Plan:

## 2022-02-20 ENCOUNTER — Encounter: Payer: Self-pay | Admitting: Internal Medicine

## 2022-02-20 NOTE — Assessment & Plan Note (Signed)
EKG done today and stable from prior. Checking CBC, CMP, troponin. Refer to cardiology as she may need stress testing.

## 2022-02-20 NOTE — Assessment & Plan Note (Signed)
Checking HgA1c as due and to help risk stratify.

## 2022-02-20 NOTE — Assessment & Plan Note (Signed)
Checking lipid panel as due and adjust crestor 20 mg three times a week as needed.

## 2022-02-20 NOTE — Assessment & Plan Note (Signed)
BP at goal on hctz 12.5 mg daily. Checking CMP and adjust as needed.

## 2022-02-23 ENCOUNTER — Other Ambulatory Visit: Payer: Self-pay | Admitting: Internal Medicine

## 2022-03-06 DIAGNOSIS — R300 Dysuria: Secondary | ICD-10-CM | POA: Diagnosis not present

## 2022-03-10 NOTE — Progress Notes (Unsigned)
Cardiology Office Note:    Date:  03/11/2022   ID:  [name], DOB 1946/01/18, MRN [id number]  PCP:  Hoyt Koch, MD   [facility] Cardiologist:  Werner Lean, MD     Referring MD: Hoyt Koch, *   CC: New chest discomfort Consulted for the evaluation of chest pain at the behest of Dr. Sharlet Salina  History of Present Illness:    [name] is a 76 y.o. female with a hx of HTN, HLD complicated by statin intolerance (elevated CK per chart - she was unclear which CK bad dreams, failed simvastatin, pravastatin, and atorvastatin).  Patient notes that she is feeling new exertional DOE with .   Her husband passed last year from COVID-19 PNA.  Even then she was having exertional chest discomfort and DOE.  Has exertional chest pressure when going to the store that resolves unless she is walking to store.  Doesn't feeling it going up and down the stairs at home.  Really only feels it with big walks: the [place] or [employer].  No shortness of breath, DOE .  No PND or orthopnea.  No weight gain, leg swelling , or abdominal swelling.  No syncope or near syncope . Notes  rare palpitations.  Patient reports prior cardiac testing including distant echo.   Past Medical History:  Diagnosis Date   Arthritis    KNEES   Blood transfusion without reported diagnosis    Cataract    CTS (carpal tunnel syndrome)    BILATERAL LEFT > RIGHT (OCC. HAND NUMBNESS)   Female cystocele    GERD (gastroesophageal reflux disease)    H/O hiatal hernia    History of cervical dysplasia    CIN II   History of peritonitis    2006--  S/P PERFORATED BOWEL INJURY DURING SURGERY   Hyperlipidemia    Hypertension     Past Surgical History:  Procedure Laterality Date   ABDOMINAL HYSTERECTOMY  1991   W/ BILATERAL SALPINGOOPHORECTOMY   BREAST BIOPSY     LAPAROSCOPIC CHOLECYSTECTOMY  2006   W/ PERFERATED BOWEL (INJURY)   NASAL SEPTUM SURGERY  70's   REPAIR  DEVIATED SEPTUM   sepsis after gallbladder surgery     TUBAL LIGATION  1980'S   VAGINAL PROLAPSE REPAIR N/A 08/15/2012   Procedure: VAGINAL VAULT SUSPENSION;  Surgeon: Ailene Rud, MD;  Location: [facility];  Service: Urology;  Laterality: N/A;  ANTERIOR VAULT REPAIR WITH XENFORE    Current Medications: Current Meds  Medication Sig   acetaminophen (TYLENOL) 500 MG tablet Take 500-750 mg by mouth every 6 (six) hours as needed for pain.   alum & mag hydroxide-simeth (MAALOX/MYLANTA) 200-200-20 MG/5ML suspension Take 15 mLs by mouth as needed for indigestion or heartburn.   esomeprazole (NEXIUM) 40 MG capsule Take 1 capsule (40 mg total) by mouth daily.   estradiol (ESTRACE) 0.1 MG/GM vaginal cream Place 1 Applicatorful vaginally 3 (three) times a week.   famotidine (PEPCID) 20 MG tablet TAKE 1 TABLET BY MOUTH TWICE A DAY   fluticasone (FLONASE) 50 MCG/ACT nasal spray Place 2 sprays into both nostrils daily. (Patient taking differently: Place 2 sprays into both nostrils daily as needed.)   hydrochlorothiazide (HYDRODIURIL) 12.5 MG tablet Take 1 tablet (12.5 mg total) by mouth daily.   Iron-Vitamins (S.S.S. TONIC PO) Take 1 tablet by mouth once a week.   metoprolol tartrate (LOPRESSOR) 100 MG tablet Take 2 hours prior to Cardiac CT  metoprolol tartrate (LOPRESSOR) 25 MG tablet Take 0.5 tablets (12.5 mg total) by mouth 2 (two) times daily.   Multiple Vitamin (MULTIVITAMIN) tablet Take 1 tablet by mouth 3 (three) times a week.   nitroGLYCERIN (NITROSTAT) 0.4 MG SL tablet Place 1 tablet (0.4 mg total) under the tongue every 5 (five) minutes as needed for chest pain.   OVER THE COUNTER MEDICATION Place 1 suppository rectally daily as needed (for hemmorhoids). Fleet   Probiotic Product (PROBIOTIC-10 ULTIMATE) CAPS Take by mouth daily at 6 (six) AM.   rosuvastatin (CRESTOR) 20 MG tablet TAKE 1 TABLET BY MOUTH THREE TIMES A WEEK.   VITAMIN D PO Take 1 tablet by mouth daily.      Allergies:   Zocor [simvastatin], Advil [ibuprofen], Bran [wheat bran], Hydrocodone, Hydrocodone-acetaminophen, Lipitor [atorvastatin], and Pravachol [pravastatin sodium]   Social History   Socioeconomic History   Marital status: Married    Spouse name: Not on file   Number of children: Not on file   Years of education: Not on file   Highest education level: Not on file  Occupational History   Not on file  Tobacco Use   Smoking status: Never   Smokeless tobacco: Never  Vaping Use   Vaping Use: Never used  Substance and Sexual Activity   Alcohol use: No    Alcohol/week: 0.0 standard drinks of alcohol    Comment: no   Drug use: No   Sexual activity: Not Currently    Birth control/protection: Surgical  Other Topics Concern   Not on file  Social History Narrative   Married '68. 2 son - '69, '70; 1 son - '71: 2 grandchildren. 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   Social Determinants of Health   Financial Resource Strain: Low Risk  (06/18/2021)   Overall Financial Resource Strain (CARDIA)    Difficulty of Paying Living Expenses: Not hard at all  Food Insecurity: No Food Insecurity (06/18/2021)   Hunger Vital Sign    Worried About Running Out of Food in the Last Year: Never true    Ran Out of Food in the Last Year: Never true  Transportation Needs: No Transportation Needs (06/18/2021)   PRAPARE - Hydrologist (Medical): No    Lack of Transportation (Non-Medical): No  Physical Activity: Sufficiently Active (06/18/2021)   Exercise Vital Sign    Days of Exercise per Week: 5 days    Minutes of Exercise per Session: 30 min  Stress: No Stress Concern Present (06/18/2021)   [place]    Feeling of Stress : Not at all  Social Connections: Moderately Integrated (06/18/2021)   Social Connection and Isolation Panel [NHANES]    Frequency of Communication with Friends and  Family: More than three times a week    Frequency of Social Gatherings with Friends and Family: Once a week    Attends Religious Services: More than 4 times per year    Active Member of [employer] or Organizations: Yes    Attends Archivist Meetings: More than 4 times per year    Marital Status: Widowed     Family History: The patient's family history includes Aneurysm in her brother; Breast cancer in her sister and sister; Cancer in an other family member; Coronary artery disease in her mother and another family member; Diabetes in an other family member; Heart disease in her mother; Hypertension in her mother and another family member; Other in her mother; Pancreatic  cancer in her brother; Prostate cancer in her brother; Stroke in her father and another family member. There is no history of Colon cancer, Esophageal cancer, Stomach cancer, or Liver disease.  ROS:   Please see the history of present illness.     All other systems reviewed and are negative.  EKGs/Labs/Other Studies Reviewed:    The following studies were reviewed today:  EKG:  EKG is  ordered today.  The ekg ordered today demonstrates  03/11/22: SR with 84 with T wave flattening  Transthoracic Echocardiogram: Date: 2007 Report Only Results: Normal echocardiogram   Recent Labs: 02/19/2022: ALT 22; BUN 17; Creatinine, Ser 0.72; Hemoglobin 13.8; Platelets 263.0; Potassium 3.3; Sodium 140  Recent Lipid Panel    Component Value Date/Time   CHOL 208 (H) 02/19/2022 1045   CHOL 272 (H) 05/22/2014 0956   TRIG 116.0 02/19/2022 1045   TRIG 111 05/22/2014 0956   HDL 63.50 02/19/2022 1045   HDL 67 05/22/2014 0956   CHOLHDL 3 02/19/2022 1045   VLDL 23.2 02/19/2022 1045   LDLCALC 122 (H) 02/19/2022 1045   LDLCALC 183 (H) 05/22/2014 0956   LDLDIRECT 175.3 04/24/2008 0848       Physical Exam:    VS:  BP 120/68   Pulse 84   Ht 5' (1.524 m)   Wt 138 lb (62.6 kg)   SpO2 97%   BMI 26.95 kg/m     Wt Readings from  Last 3 Encounters:  03/11/22 138 lb (62.6 kg)  02/19/22 138 lb (62.6 kg)  08/04/21 134 lb 9.6 oz (61.1 kg)    Gen: No distress   Neck: No JVD Cardiac: No Rubs or Gallops, No murmur, RRR +2 radial pulses Respiratory: Clear to auscultation bilaterally, normal effort, normal  respiratory rate GI: Soft, nontender, non-distended  MS: No  edema;  moves all extremities Integument: Skin feels warm Neuro:  At time of evaluation, alert and oriented to person/place/time/situation  Psych: Normal affect, patient feels well  ASSESSMENT:    1. Precordial chest pain   2. Essential hypertension   3. Mixed hyperlipidemia   4. Statin myopathy   5. Precordial pain    PLAN:    Chest pain (chest heaviness with significant exertion) HTN HLD complicated by statin intolerance (pravastatin, simvastatin, atorvastatin) - will get cardiac CT - will start metoprolol 12.5 mg PO BID and treat for presumed CAD; if fatigue will need BP check and may need to stop therapy - PRN ntiro - if no significant proximal disease will trial medical therapy and have her f/u in Winter; would start ASA and zetia with f/u lipids and full LFTs - if significant proximal disease would bring back for consideration of procedural interventions      Winter f/u with me   Medication Adjustments/Labs and Tests Ordered: Current medicines are reviewed at length with the patient today.  Concerns regarding medicines are outlined above.  Orders Placed This Encounter  Procedures   CT CORONARY MORPH W/CTA COR W/SCORE W/CA W/CM &/OR WO/CM   Basic metabolic panel   EKG 75-IEPP   Meds ordered this encounter  Medications   metoprolol tartrate (LOPRESSOR) 25 MG tablet    Sig: Take 0.5 tablets (12.5 mg total) by mouth 2 (two) times daily.    Dispense:  90 tablet    Refill:  3   nitroGLYCERIN (NITROSTAT) 0.4 MG SL tablet    Sig: Place 1 tablet (0.4 mg total) under the tongue every 5 (five) minutes as needed for chest pain.  Dispense:  25 tablet    Refill:  3   metoprolol tartrate (LOPRESSOR) 100 MG tablet    Sig: Take 2 hours prior to Cardiac CT    Dispense:  1 tablet    Refill:  0    Patient Instructions  Medication Instructions:  Your physician has recommended you make the following change in your medication:  START: metoprolol tartrate (Lopressor) 12.5 mg by mouth twice daily  START: Nitroglycerin 0.4 mg under your tongue as needed for Chest Pain; may take up to 3 tablets   If you have chest pain place 1 tablet under your tongue, wait 5 min If chest pain continues place a 2nd tablet under your tongue, wait 5 min If chest pain continues place a 3rd tablet under your tongue, wait 5 min If chest pain continues call 911  *If you need a refill on your cardiac medications before your next appointment, please call your pharmacy*   Lab Work: TODAY: BMP If you have labs (blood work) drawn today and your tests are completely normal, you will receive your results only by: No Name (if you have MyChart) OR A paper copy in the mail If you have any lab test that is abnormal or we need to change your treatment, we will call you to review the results.   Testing/Procedures: Your physician has requested that you have cardiac CT. Cardiac computed tomography (CT) is a painless test that uses an x-ray machine to take clear, detailed pictures of your heart. For further information please visit HugeFiesta[employer]tn. Please follow instruction sheet as given.     Follow-Up: At [facility], you and your health needs are our priority.  As part of our continuing mission to provide you with exceptional heart care, we have created designated Provider Care Teams.  These Care Teams include your primary Cardiologist (physician) and Advanced Practice Providers (APPs -  Physician Assistants and Nurse Practitioners) who all work together to provide you with the care you need, when you need it.    Your next  appointment:   4 month(s)  The format for your next appointment:   In Person  Provider:   Werner Lean, MD     Other Instructions  Your cardiac CT will be scheduled at one of the below locations:   [facility] [address] [place], [place] [address] [phone]  [facility] [address] [place], [place] [address] [phone]  [facility] [place], [place] [address] [phone]  If scheduled at [facility], please arrive at the [facility] and Children's Entrance (Entrance C2) of [facility] 30 minutes prior to test start time. You can use the FREE valet parking offered at entrance C (encouraged to control the heart rate for the test)  Proceed to the [facility] Radiology Department (first floor) to check-in and test prep.  All radiology patients and guests should use entrance C2 at [facility], accessed from [facility], even though the hospital's physical address listed is [address].    If scheduled at [facility] or [facility], please arrive 15 mins early for check-in and test prep.   Please follow these instructions carefully (unless otherwise directed):   On the Night Before the Test: Be sure to Drink plenty of water. Do not consume any caffeinated/decaffeinated beverages or chocolate 12 hours prior to your  test. Do not take any antihistamines 12 hours prior to your test. (Flonase)   On the Day of the Test: Drink plenty of water until 1 hour prior to the test. You may take your regular medications prior to the test.  Take metoprolol (Lopressor) 100 mg by mouth two hours prior to test. HOLD Hydrochlorothiazide morning of the test. FEMALES- please wear underwire-free bra if available, avoid dresses & tight clothing       After the Test: Drink  plenty of water. After receiving IV contrast, you may experience a mild flushed feeling. This is normal. On occasion, you may experience a mild rash up to 24 hours after the test. This is not dangerous. If this occurs, you can take Benadryl 25 mg and increase your fluid intake. If you experience trouble breathing, this can be serious. If it is severe call 911 IMMEDIATELY. If it is mild, please call our office. If you take any of these medications: Glipizide/Metformin, Avandament, Glucavance, please do not take 48 hours after completing test unless otherwise instructed.  We will call to schedule your test 2-4 weeks out understanding that some insurance companies will need an authorization prior to the service being performed.   For non-scheduling related questions, please contact the cardiac imaging nurse navigator should you have any questions/concerns: Marchia Bond, Cardiac Imaging Nurse Navigator Gordy Clement, Cardiac Imaging Nurse Navigator [facility] Direct Office Dial: [phone]   For scheduling needs, including cancellations and rescheduling, please call [place], [phone].   Important Information About Sugar         Signed, Werner Lean, MD  03/11/2022 10:33 AM    [place]

## 2022-03-11 ENCOUNTER — Ambulatory Visit: Payer: Medicare HMO | Attending: Internal Medicine | Admitting: Internal Medicine

## 2022-03-11 ENCOUNTER — Encounter: Payer: Self-pay | Admitting: Internal Medicine

## 2022-03-11 VITALS — BP 120/68 | HR 84 | Ht 60.0 in | Wt 138.0 lb

## 2022-03-11 DIAGNOSIS — R072 Precordial pain: Secondary | ICD-10-CM | POA: Diagnosis not present

## 2022-03-11 DIAGNOSIS — G720 Drug-induced myopathy: Secondary | ICD-10-CM | POA: Diagnosis not present

## 2022-03-11 DIAGNOSIS — E782 Mixed hyperlipidemia: Secondary | ICD-10-CM | POA: Diagnosis not present

## 2022-03-11 DIAGNOSIS — I10 Essential (primary) hypertension: Secondary | ICD-10-CM

## 2022-03-11 DIAGNOSIS — T466X5A Adverse effect of antihyperlipidemic and antiarteriosclerotic drugs, initial encounter: Secondary | ICD-10-CM | POA: Diagnosis not present

## 2022-03-11 LAB — BASIC METABOLIC PANEL
BUN/Creatinine Ratio: 16 (ref 12–28)
BUN: 13 mg/dL (ref 8–27)
CO2: 31 mmol/L — ABNORMAL HIGH (ref 20–29)
Calcium: 10.7 mg/dL — ABNORMAL HIGH (ref 8.7–10.3)
Chloride: 97 mmol/L (ref 96–106)
Creatinine, Ser: 0.81 mg/dL (ref 0.57–1.00)
Glucose: 93 mg/dL (ref 70–99)
Potassium: 4 mmol/L (ref 3.5–5.2)
Sodium: 142 mmol/L (ref 134–144)
eGFR: 75 mL/min/{1.73_m2} (ref >59)

## 2022-03-11 MED ORDER — NITROGLYCERIN 0.4 MG SL SUBL: 0.4000 mg | SUBLINGUAL_TABLET | SUBLINGUAL | 3 refills | Status: DC | PRN

## 2022-03-11 MED ORDER — METOPROLOL TARTRATE 25 MG PO TABS: 12.5000 mg | ORAL_TABLET | Freq: Two times a day (BID) | ORAL | 3 refills | Status: DC

## 2022-03-11 MED ORDER — METOPROLOL TARTRATE 100 MG PO TABS: ORAL_TABLET | ORAL | 0 refills | Status: DC

## 2022-03-11 NOTE — Patient Instructions (Addendum)
Medication Instructions:  Your physician has recommended you make the following change in your medication:  START: metoprolol tartrate (Lopressor) 12.5 mg by mouth twice daily  START: Nitroglycerin 0.4 mg under your tongue as needed for Chest Pain; may take up to 3 tablets   If you have chest pain place 1 tablet under your tongue, wait 5 min If chest pain continues place a 2nd tablet under your tongue, wait 5 min If chest pain continues place a 3rd tablet under your tongue, wait 5 min If chest pain continues call 911  *If you need a refill on your cardiac medications before your next appointment, please call your pharmacy*   Lab Work: TODAY: BMP If you have labs (blood work) drawn today and your tests are completely normal, you will receive your results only by: [place] (if you have MyChart) OR A paper copy in the mail If you have any lab test that is abnormal or we need to change your treatment, we will call you to review the results.   Testing/Procedures: Your physician has requested that you have cardiac CT. Cardiac computed tomography (CT) is a painless test that uses an x-ray machine to take clear, detailed pictures of your heart. For further information please visit HugeFiesta.tn. Please follow instruction sheet as given.     Follow-Up: At [facility], you and your health needs are our priority.  As part of our continuing mission to provide you with exceptional heart care, we have created designated Provider Care Teams.  These Care Teams include your primary Cardiologist (physician) and Advanced Practice Providers (APPs -  Physician Assistants and Nurse Practitioners) who all work together to provide you with the care you need, when you need it.    Your next appointment:   4 month(s)  The format for your next appointment:   In Person  Provider:   Werner Lean, MD     Other Instructions  Your cardiac CT will be scheduled at one of the  below locations:   [facility] [address] [place], [place] [address] [phone]  [place] [address] [place], [place] [address] [phone]  [facility] [place], [place] [address] [phone]  If scheduled at [facility], please arrive at the [facility] and Children's Entrance (Entrance C2) of [facility] 30 minutes prior to test start time. You can use the FREE valet parking offered at entrance C (encouraged to control the heart rate for the test)  Proceed to the [facility] Radiology Department (first floor) to check-in and test prep.  All radiology patients and guests should use entrance C2 at [facility], accessed from [facility], even though the hospital's physical address listed is [address]    If scheduled at [facility] or [facility], please arrive 15 mins early for check-in and test prep.   Please follow these instructions carefully (unless otherwise directed):   On the Night Before the Test: Be sure to Drink plenty of water. Do not consume any caffeinated/decaffeinated beverages or chocolate 12 hours prior to your test. Do not take any antihistamines 12 hours prior to your test. (Flonase)   On the Day of the Test: Drink plenty of water until 1 hour prior to the test. You may take your regular medications prior to the test.  Take metoprolol (Lopressor) 100 mg  by mouth two hours prior to test. HOLD Hydrochlorothiazide morning of the test. FEMALES- please wear underwire-free bra if available, avoid dresses & tight clothing       After the Test: Drink plenty of water. After receiving IV contrast, you may experience a mild flushed feeling. This is normal. On occasion, you may experience a mild rash up to 24 hours after the test. This is not  dangerous. If this occurs, you can take Benadryl 25 mg and increase your fluid intake. If you experience trouble breathing, this can be serious. If it is severe call 911 IMMEDIATELY. If it is mild, please call our office. If you take any of these medications: Glipizide/Metformin, Avandament, Glucavance, please do not take 48 hours after completing test unless otherwise instructed.  We will call to schedule your test 2-4 weeks out understanding that some insurance companies will need an authorization prior to the service being performed.   For non-scheduling related questions, please contact the cardiac imaging nurse navigator should you have any questions/concerns: Marchia Bond, Cardiac Imaging Nurse Navigator Gordy Clement, Cardiac Imaging Nurse Navigator [facility] Direct Office Dial: [phone]   For scheduling needs, including cancellations and rescheduling, please call [place], [phone].   Important Information About Sugar

## 2022-03-16 ENCOUNTER — Telehealth: Payer: Self-pay | Admitting: Internal Medicine

## 2022-03-16 MED ORDER — IVABRADINE HCL 7.5 MG PO TABS: ORAL_TABLET | ORAL | 0 refills | Status: DC

## 2022-03-16 NOTE — Telephone Encounter (Signed)
  Pt c/o medication issue:  1. Name of Medication: metoprolol tartrate (LOPRESSOR) 25 MG tablet  2. How are you currently taking this medication (dosage and times per day)? Take 0.5 tablets (12.5 mg total) by mouth 2 (two) times daily.  3. Are you having a reaction (difficulty breathing--STAT)? No   4. What is your medication issue? Pt said, since she started taking this med this weekend and she felt drowsy and ringing in her ears, she felt terrible. She said, she did not take it at night, she did try taking it again yesterday morning and felt the same

## 2022-03-16 NOTE — Telephone Encounter (Signed)
Can stop BB  Can use ivabradine 15 for CT   Called pt advised of MD recommendation above.  Pt expresses understanding. All questions answered.

## 2022-03-16 NOTE — Telephone Encounter (Signed)
Called pt in regards to metoprolol.  Reports took 1st dose of metoprolol 12.5 mg on 03/15/22 morning.  Felt okay took 2nd dose felt bad.  Drowsy and ringing in ears.  Took 3rd dose Sun  morning was drowsy all day.  Was able to go to church but was really drowsy.  Feels medication is to strong for her.   OV 03/11/22:  1. Precordial chest pain   2. Essential hypertension   3. Mixed hyperlipidemia   4. Statin myopathy   5. Precordial pain     Cardiac CT ordered with metoprolol 100 mg.  Pt will need another medication as can not tolerate metoprolol.    Reports BP okay; log is at home not currently home.  Thinks BP today was 123/74.  Feels like normal today as has not taken metoprolol.  Advised pt not to take anymore metoprolol will have MD review.

## 2022-03-21 ENCOUNTER — Other Ambulatory Visit: Payer: Self-pay | Admitting: Internal Medicine

## 2022-03-23 NOTE — Progress Notes (Unsigned)
   GYNECOLOGY OFFICE VISIT NOTE  History:   [name] is a 76 y.o. [phone] here today for left breast pain. It radiates into her arm pit. She does not feel any lumps on SBE.    The pain has been there about 3 weeks. It at first started more centrally so she saw her PCP who did a cardiac work up and that has been negative thus far. She has a cardiac CT next week however. Then the pain moved more to her left breast and would radiate into the nipple. She has no skin changes. The pain comes and goes.   The following portions of the patient's history were reviewed and updated as appropriate: allergies, current medications, past family history, past medical history, past social history, past surgical history and problem list.   Health Maintenance:   Diagnosis  Date Value Ref Range Status  07/13/2018   Final   NEGATIVE FOR INTRAEPITHELIAL LESIONS OR MALIGNANCY.     Normal mammogram on 07/2021.   Review of Systems:  Pertinent items noted in HPI and remainder of comprehensive ROS otherwise negative.  Physical Exam:  BP 131/77   Pulse 75   Wt 138 lb (62.6 kg)   BMI 26.95 kg/m  CONSTITUTIONAL: Well-developed, well-nourished female in no acute distress.  HEENT:  Normocephalic, atraumatic. External right and left ear normal. No scleral icterus.  NECK: Normal range of motion, supple, no masses noted on observation SKIN: No rash noted. Not diaphoretic. No erythema. No pallor. MUSCULOSKELETAL: Normal range of motion. No edema noted. NEUROLOGIC: Alert and oriented to person, place, and time. Normal muscle tone coordination. No cranial nerve deficit noted. PSYCHIATRIC: Normal mood and affect. Normal behavior. Normal judgment and thought content.  Breasts: breasts appear normal, no suspicious masses, no skin or nipple changes or axillary nodes.   Labs and Imaging No results found for this or any previous visit (from the past 168 hour(s)). No results found.  Assessment and Plan:   1.  Breast pain, left Reassured her that pain is rarely a symptom of breast cancer. Discussed I would get Cardiac CT next week and if all is normal, continue to monitor pain for another 2-3 weeks. If pain is still present then I would have her go for diagnostic mammogram and possible US of the left breast. Pt agrees with plan. All questions answered.  -     US BREAST LTD UNI LEFT INC AXILLA; Future -     MM Digital Diagnostic Unilat L; Future   Routine preventative health maintenance measures emphasized. Please refer to After Visit Summary for other counseling recommendations.   Return if symptoms worsen or fail to improve.  Radene Gunning, MD, [place] for [facility], [place]

## 2022-03-24 ENCOUNTER — Ambulatory Visit (INDEPENDENT_AMBULATORY_CARE_PROVIDER_SITE_OTHER): Payer: Medicare HMO | Admitting: Obstetrics and Gynecology

## 2022-03-24 ENCOUNTER — Encounter: Payer: Self-pay | Admitting: Obstetrics and Gynecology

## 2022-03-24 VITALS — BP 131/77 | HR 75 | Wt 138.0 lb

## 2022-03-24 DIAGNOSIS — N644 Mastodynia: Secondary | ICD-10-CM

## 2022-03-24 NOTE — Progress Notes (Signed)
RGYN patient here for evaluation of pain in left breast x 3 weeks now that radiates under arm pit.  Pt does not feel any lumps.  Mammogram 08/21/21 WNL.

## 2022-03-28 ENCOUNTER — Other Ambulatory Visit: Payer: Self-pay | Admitting: Internal Medicine

## 2022-03-30 ENCOUNTER — Telehealth (HOSPITAL_COMMUNITY): Payer: Self-pay | Admitting: Emergency Medicine

## 2022-03-30 NOTE — Telephone Encounter (Signed)
Reaching out to patient to offer assistance regarding upcoming cardiac imaging study; pt verbalizes understanding of appt date/time, parking situation and where to check in, pre-test NPO status and medications ordered, and verified current allergies; name and call back number provided for further questions should they arise Kimberly Bond RN Navigator Cardiac Imaging [facility] [phone] office [phone] cell  '15mg'$  ivabradine  Arrival 1100 w/c entrnace Holding [facility]

## 2022-03-31 ENCOUNTER — Ambulatory Visit (HOSPITAL_COMMUNITY)
Admission: RE | Admit: 2022-03-31 | Discharge: 2022-03-31 | Disposition: A | Discharge status: Home / Self Care | Payer: Medicare HMO | Source: Ambulatory Visit | Attending: Internal Medicine | Admitting: Internal Medicine

## 2022-03-31 ENCOUNTER — Telehealth: Payer: Self-pay | Admitting: Internal Medicine

## 2022-03-31 DIAGNOSIS — R072 Precordial pain: Secondary | ICD-10-CM | POA: Diagnosis not present

## 2022-03-31 MED ORDER — IOHEXOL 350 MG/ML SOLN
100.0000 mL | Freq: Once | INTRAVENOUS | Status: AC | PRN
  Administered 2022-03-31: 100 mL via INTRAVENOUS

## 2022-03-31 MED ORDER — NITROGLYCERIN 0.4 MG SL SUBL
0.8000 mg | SUBLINGUAL_TABLET | SUBLINGUAL | Status: DC | PRN
  Administered 2022-03-31: 0.8 mg via SUBLINGUAL

## 2022-03-31 MED ORDER — NITROGLYCERIN 0.4 MG SL SUBL
SUBLINGUAL_TABLET | SUBLINGUAL | Status: AC
  Filled 2022-03-31: qty 2

## 2022-03-31 MED ORDER — ASPIRIN 81 MG PO TBEC: 81.0000 mg | DELAYED_RELEASE_TABLET | Freq: Every day | ORAL | 3 refills | Status: DC

## 2022-03-31 MED ORDER — EZETIMIBE 10 MG PO TABS: 10.0000 mg | ORAL_TABLET | Freq: Every day | ORAL | 3 refills | Status: DC

## 2022-03-31 NOTE — Telephone Encounter (Signed)
Patient is calling for her CT results.  Please call back.

## 2022-03-31 NOTE — Telephone Encounter (Signed)
Results:  Mild CAD  Plan:  No cardiac cause of CP  ASA  81 mg Po daily  Zetia 10 mg  Fasting for next visit   Werner Lean, MD   The patient has been notified of the result and verbalized understanding.  All questions (if any) were answered. Precious Gilding, RN 03/31/2022 5:36 PM

## 2022-03-31 NOTE — Addendum Note (Signed)
Addended by: Precious Gilding on: 03/31/2022 05:54 PM   Modules accepted: Orders

## 2022-04-03 ENCOUNTER — Telehealth: Payer: Self-pay | Admitting: Internal Medicine

## 2022-04-03 ENCOUNTER — Ambulatory Visit
Admission: RE | Admit: 2022-04-03 | Discharge: 2022-04-03 | Disposition: A | Discharge status: Home / Self Care | Payer: Medicare HMO | Source: Ambulatory Visit | Attending: Obstetrics and Gynecology | Admitting: Obstetrics and Gynecology

## 2022-04-03 ENCOUNTER — Ambulatory Visit: Payer: Medicare HMO

## 2022-04-03 DIAGNOSIS — N644 Mastodynia: Secondary | ICD-10-CM | POA: Diagnosis not present

## 2022-04-03 NOTE — Telephone Encounter (Signed)
Per OV note on 5/52/58: HLD complicated by statin intolerance (pravastatin, simvastatin, atorvastatin) - will get cardiac CT - will start metoprolol 12.5 mg PO BID and treat for presumed CAD; if fatigue will need BP check and may need to stop therapy  Per Cardiac CT result:  Werner Lean, MD  03/31/2022  3:21 PM EDT     Results: Mild CAD Plan: No cardiac cause of CP ASA  81 mg Po daily Zetia 10 mg Fasting for next visit   Werner Lean, MD   Routing to MD for clarification, but appears pt should only be on Zetia and Asa.

## 2022-04-03 NOTE — Telephone Encounter (Signed)
Pt c/o medication issue:  1. Name of Medication:   rosuvastatin (CRESTOR) 20 MG tablet    2. How are you currently taking this medication (dosage and times per day)? TAKE 1 TABLET BY MOUTH THREE TIMES A WEEK.  3. Are you having a reaction (difficulty breathing--STAT)? No  4. What is your medication issue? Pt would like a callback on whether or not she is to continue taking medication. Please advise

## 2022-04-06 NOTE — Telephone Encounter (Signed)
Called and spoke with patient to let her know that she is to continue taking the Rosuvastatin three times weekly. She has no further questions.

## 2022-04-20 ENCOUNTER — Telehealth: Payer: Self-pay | Admitting: Internal Medicine

## 2022-04-20 NOTE — Telephone Encounter (Signed)
Called pt in regards to tinnitus.  Reports started after starting zetia and aspirin.  No other symptoms noted.  Nothing causes ringing to improve or get worse. Advised to stop Zetia and call our office with an update by Friday Apr 24, 2022.

## 2022-04-20 NOTE — Telephone Encounter (Signed)
Pt c/o medication issue:  1. Name of Medication: Rosuvastatin, Ezetimibe and Aspirin  2. How are you currently taking this medication (dosage and times per day)?   3. Are you having a reaction (difficulty breathing--STAT)?   4. What is your medication issue? Ringing in her ears since Friday night

## 2022-04-22 ENCOUNTER — Ambulatory Visit (INDEPENDENT_AMBULATORY_CARE_PROVIDER_SITE_OTHER): Payer: Medicare HMO | Admitting: Podiatrist

## 2022-04-22 DIAGNOSIS — L603 Nail dystrophy: Secondary | ICD-10-CM

## 2022-04-22 NOTE — Progress Notes (Signed)
Chief Complaint  Patient presents with   toe nails    Bilateral great toe discoloration and thickness possible fungus - on going for 3 weeks.     HPI: Patient is 76 y.o. female who presents today for thickness and discoloration of the left hallux nail, left second toenail and right hallux nail.  She relates she has had this problem for 3 weeks and is concerned it is not improving.    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     alum & mag hydroxide-simeth (MAALOX/MYLANTA) 200-200-20 MG/5ML suspension Take 15 mLs by mouth as needed for indigestion or heartburn.     aspirin EC 81 MG tablet Take 1 tablet (81 mg total) by mouth daily. Swallow whole. 90 tablet 3   esomeprazole (NEXIUM) 40 MG capsule Take 1 capsule (40 mg total) by mouth daily. 90 capsule 3   estradiol (ESTRACE) 0.1 MG/GM vaginal cream Place 1 Applicatorful vaginally 3 (three) times a week. 42.5 g 3   ezetimibe (ZETIA) 10 MG tablet Take 1 tablet (10 mg total) by mouth daily. 90 tablet 3   famotidine (PEPCID) 20 MG tablet TAKE 1 TABLET BY MOUTH TWICE A DAY 60 tablet 1   fluticasone (FLONASE) 50 MCG/ACT nasal spray Place 2 sprays into both nostrils daily. (Patient taking differently: Place 2 sprays into both nostrils daily as needed.) 48 g 6   hydrochlorothiazide (HYDRODIURIL) 12.5 MG tablet Take 1 tablet (12.5 mg total) by mouth daily. 90 tablet 3   Iron-Vitamins (S.S.S.  TONIC PO) Take 1 tablet by mouth once a week.     ivabradine (CORLANOR) 7.5 MG TABS tablet Take 2 hours prior to Cardiac CT 2 tablet 0   Multiple Vitamin (MULTIVITAMIN) tablet Take 1 tablet by mouth 3 (three) times a week.     nitroGLYCERIN (NITROSTAT) 0.4 MG SL tablet Place 1 tablet (0.4 mg total) under the tongue every 5 (five) minutes as needed for chest pain. 25 tablet 3   OVER THE COUNTER MEDICATION Place 1 suppository rectally daily as needed (for hemmorhoids). Fleet     Probiotic Product (PROBIOTIC-10 ULTIMATE) CAPS Take by mouth daily at 6 (six) AM.     rosuvastatin (CRESTOR) 20 MG tablet TAKE 1 TABLET BY MOUTH THREE TIMES A WEEK. 36 tablet 3   VITAMIN D PO Take 1 tablet by mouth daily.     [DISCONTINUED] metoprolol tartrate (LOPRESSOR) 100 MG tablet Take 2 hours prior to Cardiac CT 1 tablet 0   No current facility-administered medications on file prior to visit.    Allergies  Allergen Reactions   Zocor [Simvastatin]     12/2013 CK 528   Advil [Ibuprofen] Swelling    Tongue swells   Bran [Wheat Bran] Other (See Comments)    Very bad GI pain   Hydrocodone Nausea Only and Other (See Comments)     TACHYCARDIA   Hydrocodone-Acetaminophen Nausea Only    Other reaction(s): Increased Heart Rate (intolerance)  Metoprolol Tartrate Tinitus   Lipitor [Atorvastatin] Other (See Comments)    Bad dreams   Pravachol [Pravastatin Sodium] Other (See Comments)    Bad dreams    Review of Systems No fevers, chills, nausea, muscle aches, no difficulty breathing, no calf pain, no chest pain or shortness of breath.   Physical Exam  GENERAL APPEARANCE: Alert, conversant. Appropriately groomed. No acute distress.   VASCULAR: Pedal pulses palpable 2/4 DP and 2/4 PT bilateral.  Capillary refill time is immediate to all digits,  Proximal to distal cooling is warm to warm.  Digital perfusion adequate.   NEUROLOGIC: sensation is intact to 5.07 monofilament at 5/5 sites bilateral.  Light touch is  intact bilateral, vibratory sensation intact bilateral  MUSCULOSKELETAL: acceptable muscle strength, tone and stability bilateral.  No gross boney pedal deformities noted.  No pain, crepitus or limitation noted with foot and ankle range of motion bilateral.   DERMATOLOGIC: skin is warm, supple, and dry.  Color, texture, and turgor of skin within normal limits.  No open wounds are noted.  Digital nail are slightly thickened bilateral 1st and second left.  No significant subungual debris noted.  No pain with palpation noted.  Dystrophic nail vs microtrauma favored.     Assessment     ICD-10-CM   1. Nail dystrophy  L60.3        Plan  Discussed treatment options and recommendations.  I recommended she try some over the counter nail treatment.  Should symptoms fail to improve could consider oral treatment .  Likely the nails will improve with topical. She will call in the future if concerns arise or further treatment needed.

## 2022-04-22 NOTE — Patient Instructions (Signed)
    Watch your toenails for any signs of infection including drainage, pus redness or swelling along the sides of the toenails.  Soak in epsom salt water and use antibiotic ointment (OTC) if you notice this start to occur.  If the redness does not resolve within 2-3 days, call for an appointment to be seen.

## 2022-04-24 NOTE — Telephone Encounter (Signed)
Called pt advised of MD response: Agreed- ASA can cause tinitis but so can sinus infection.  Will not start new medications at this time but this may not be cardiac.   Pt will stop taking aspirin to see if tinnitus improves.  Will call back in next week to update our office on status.  Pt will also reach out to PCP to see if tinnitus could be sinus related.

## 2022-04-24 NOTE — Telephone Encounter (Signed)
Called pt reports stopped Zetia as instructed on Monday.  Stopped aspirin for 2 days and still has ringing in ears.   Expressed has improved slightly but not much. Also has some sinus drainage which has had previously never had ringing in ears with drainage.  Advised pt may need to f/u with PCP.  Will send to MD to review.

## 2022-04-24 NOTE — Telephone Encounter (Signed)
Pt calling back as requested to inform nurse that ringing in the ear is still ongoing but not as bad. Please advise

## 2022-04-27 ENCOUNTER — Ambulatory Visit: Payer: Medicare HMO | Admitting: Family Medicine

## 2022-04-28 ENCOUNTER — Ambulatory Visit (INDEPENDENT_AMBULATORY_CARE_PROVIDER_SITE_OTHER): Payer: Medicare HMO | Admitting: Emergency Medicine

## 2022-04-28 ENCOUNTER — Encounter: Payer: Self-pay | Admitting: Emergency Medicine

## 2022-04-28 VITALS — BP 132/76 | HR 85 | Temp 98.6°F | Ht 60.0 in | Wt 141.4 lb

## 2022-04-28 DIAGNOSIS — I10 Essential (primary) hypertension: Secondary | ICD-10-CM

## 2022-04-28 DIAGNOSIS — H9313 Tinnitus, bilateral: Secondary | ICD-10-CM | POA: Diagnosis not present

## 2022-04-28 NOTE — Patient Instructions (Signed)
Tinnitus ?Tinnitus refers to hearing a sound when there is no actual source for that sound. This is often described as ringing in the ears. However, people with this condition may hear a variety of noises, in one ear or in both ears. ?The sounds of tinnitus can be soft, loud, or somewhere in between. Tinnitus can last for a few seconds or can be constant for days. It may go away without treatment and come back at various times. When tinnitus is constant or happens often, it can lead to other problems, such as trouble sleeping and trouble concentrating. ?Almost everyone experiences tinnitus at some point. Tinnitus is not the same as hearing loss. Tinnitus that is long-lasting (chronic) or comes back often (recurs) may require medical attention. ?What are the causes? ?The cause of tinnitus is often not known. In some cases, it can result from: ?Exposure to loud noises from machinery, music, or other sources. ?An object (foreign body) stuck in the ear. ?Earwax buildup. ?Drinking alcohol or caffeine. ?Taking certain medicines. ?Age-related hearing loss. ?It may also be caused by medical conditions such as: ?Ear or sinus infections. ?Heart diseases or high blood pressure. ?Allergies. ?M?ni?re's disease. ?Thyroid problems. ?Tumors. ?A weak, bulging blood vessel (aneurysm) near the ear. ?What increases the risk? ?The following factors may make you more likely to develop this condition: ?Exposure to loud noises. ?Age. Tinnitus is more likely in older individuals. ?Using alcohol or tobacco. ?What are the signs or symptoms? ?The main symptom of tinnitus is hearing a sound when there is no source for that sound. It may sound like: ?Buzzing. ?Sizzling. ?Ringing. ?Blowing air. ?Hissing. ?Whistling. ?Other sounds may include: ?Roaring. ?Running water. ?A musical note. ?Tapping. ?Humming. ?Symptoms may affect only one ear (unilateral) or both ears (bilateral). ?How is this diagnosed? ?Tinnitus is diagnosed based on your symptoms,  your medical history, and a physical exam. Your health care provider may do a thorough hearing test (audiologic exam) if your tinnitus: ?Is unilateral. ?Causes hearing difficulties. ?Lasts 6 months or longer. ?You may work with a health care provider who specializes in hearing disorders (audiologist). You may be asked questions about your symptoms and how they affect your daily life. You may have other tests done, such as: ?CT scan. ?MRI. ?An imaging test of how blood flows through your blood vessels (angiogram). ?How is this treated? ?Treating an underlying medical condition can sometimes make tinnitus go away. If your tinnitus continues, other treatments may include: ?Therapy and counseling to help you manage the stress of living with tinnitus. ?Sound generators to mask the tinnitus. These include: ?Tabletop sound machines that play relaxing sounds to help you fall asleep. ?Wearable devices that fit in your ear and play sounds or music. ?Acoustic neural stimulation. This involves using headphones to listen to music that contains an auditory signal. Over time, listening to this signal may change some pathways in your brain and make you less sensitive to tinnitus. This treatment is used for very severe cases when no other treatment is working. ?Using hearing aids or cochlear implants if your tinnitus is related to hearing loss. Hearing aids are worn in the outer ear. Cochlear implants are surgically placed in the inner ear. ?Follow these instructions at home: ?Managing symptoms ? ?  ? ?When possible, avoid being in loud places and being exposed to loud sounds. ?Wear hearing protection, such as earplugs, when you are exposed to loud noises. ?Use a white noise machine, a humidifier, or other devices to mask the sound of tinnitus. ?  Practice techniques for reducing stress, such as meditation, yoga, or deep breathing. Work with your health care provider if you need help with managing stress. ?Sleep with your head  slightly raised. This may reduce the impact of tinnitus. ?General instructions ?Do not use stimulants, such as nicotine, alcohol, or caffeine. Talk with your health care provider about other stimulants to avoid. Stimulants are substances that can make you feel alert and attentive by increasing certain activities in the body (such as heart rate and blood pressure). These substances may make tinnitus worse. ?Take over-the-counter and prescription medicines only as told by your health care provider. ?Try to get plenty of sleep each night. ?Keep all follow-up visits. This is important. ?Contact a health care provider if: ?Your tinnitus continues for 3 weeks or longer without stopping. ?You develop sudden hearing loss. ?Your symptoms get worse or do not get better with home care. ?You feel you are not able to manage the stress of living with tinnitus. ?Get help right away if: ?You develop tinnitus after a head injury. ?You have tinnitus along with any of the following: ?Dizziness. ?Nausea and vomiting. ?Loss of balance. ?Sudden, severe headache. ?Vision changes. ?Facial weakness or weakness of arms or legs. ?These symptoms may represent a serious problem that is an emergency. Do not wait to see if the symptoms will go away. Get medical help right away. Call your local emergency services (911 in the [place] Do not drive yourself to the hospital. ?Summary ?Tinnitus refers to hearing a sound when there is no actual source for that sound. This is often described as ringing in the ears. ?Symptoms may affect only one ear (unilateral) or both ears (bilateral). ?Use a white noise machine, a humidifier, or other devices to mask the sound of tinnitus. ?Do not use stimulants, such as nicotine, alcohol, or caffeine. These substances may make tinnitus worse. ?This information is not intended to replace advice given to you by your health care provider. Make sure you discuss any questions you have with your health care  provider. ?Document Revised: 05/20/2020 Document Reviewed: 05/20/2020 ?[employer]vier Patient Education ? 2023 [employer] ? ?

## 2022-04-28 NOTE — Progress Notes (Signed)
[name] 76 y.o.   Chief Complaint  Patient presents with   Sinusitis    Patient having symptoms x 2 weeks, ringing in her ears     HISTORY OF PRESENT ILLNESS: This is a 76 y.o. female complaining of ringing in both ears for the past 2 weeks. It all started shortly after she was started on Zetia.  Already taking rosuvastatin Was taking baby aspirin daily.  Stopped 5 days ago.  Ringing is still going on. Denies headache or localized weakness.  No symptoms of stroke. No other associated symptoms.  No other complaints or medical concerns today. Denies sinus pain or nasal discharge.  Denies earache.  No flulike symptoms.  Denies fever or chills. Well-controlled hypertension.  Recently seen by cardiologist and had cardiac scan done.  Told it was okay.   IMPRESSION: 1. 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   2. Coronary calcium score of 87.9. This was 56th percentile for age and sex matched control.   3. Common left coronary ostium with right dominance.   4. Consider non-coronary causes of chest pain.   Electronically Signed: By: Pixie Casino M.D. On: 03/31/2022 12:40  Sinusitis Pertinent negatives include no chills, congestion, coughing, ear pain, headaches, shortness of breath or sore throat.     Prior to Admission medications   Medication Sig Start Date End Date Taking? Authorizing Provider  acetaminophen (TYLENOL) 500 MG tablet Take 500-750 mg by mouth every 6 (six) hours as needed for pain.   Yes [provider]  alum & mag hydroxide-simeth (MAALOX/MYLANTA) 200-200-20 MG/5ML suspension Take 15 mLs by mouth as needed for indigestion or heartburn.   Yes [provider]  aspirin EC 81 MG tablet Take 1 tablet (81 mg total) by mouth daily. Swallow whole. 03/31/22  Yes Chandrasekhar, Mahesh A, MD  esomeprazole (NEXIUM) 40 MG capsule Take 1 capsule (40 mg total) by mouth daily. 04/10/19  Yes Hoyt Koch, MD  estradiol (ESTRACE) 0.1 MG/GM  vaginal cream Place 1 Applicatorful vaginally 3 (three) times a week. 08/09/20  Yes Caren Macadam, MD  ezetimibe (ZETIA) 10 MG tablet Take 1 tablet (10 mg total) by mouth daily. 03/31/22  Yes Chandrasekhar, Mahesh A, MD  famotidine (PEPCID) 20 MG tablet TAKE 1 TABLET BY MOUTH TWICE A DAY 03/25/22  Yes Hoyt Koch, MD  fluticasone [facility]) 50 MCG/ACT nasal spray Place 2 sprays into both nostrils daily. Patient taking differently: Place 2 sprays into both nostrils daily as needed. 05/16/18  Yes Hoyt Koch, MD  hydrochlorothiazide (HYDRODIURIL) 12.5 MG tablet Take 1 tablet (12.5 mg total) by mouth daily. 04/07/21  Yes Hoyt Koch, MD  Iron-Vitamins (S.S.S. TONIC PO) Take 1 tablet by mouth once a week.   Yes [provider]  ivabradine (CORLANOR) 7.5 MG TABS tablet Take 2 hours prior to Cardiac CT 03/16/22  Yes Chandrasekhar, Mahesh A, MD  Multiple Vitamin (MULTIVITAMIN) tablet Take 1 tablet by mouth 3 (three) times a week.   Yes [provider]  nitroGLYCERIN (NITROSTAT) 0.4 MG SL tablet Place 1 tablet (0.4 mg total) under the tongue every 5 (five) minutes as needed for chest pain. 03/11/22  Yes Chandrasekhar, Mahesh A, MD  OVER THE COUNTER MEDICATION Place 1 suppository rectally daily as needed (for hemmorhoids). Fleet   Yes [provider]  Probiotic Product (PROBIOTIC-10 ULTIMATE) CAPS Take by mouth daily at 6 (six) AM.   Yes [provider]  rosuvastatin (CRESTOR) 20 MG tablet TAKE 1 TABLET  BY MOUTH THREE TIMES A WEEK. 03/03/22  Yes Hoyt Koch, MD  VITAMIN D PO Take 1 tablet by mouth daily.   Yes [provider]  metoprolol tartrate (LOPRESSOR) 100 MG tablet Take 2 hours prior to Cardiac CT 03/11/22 03/16/22  Werner Lean, MD    Allergies  Allergen Reactions   Zocor [Simvastatin]     12/2013 CK 528   Advil [Ibuprofen] Swelling    Tongue swells   Bran [Wheat Bran] Other (See Comments)    Very bad  GI pain   Hydrocodone Nausea Only and Other (See Comments)     TACHYCARDIA   Hydrocodone-Acetaminophen Nausea Only    Other reaction(s): Increased Heart Rate (intolerance)   Metoprolol Tartrate Tinitus   Lipitor [Atorvastatin] Other (See Comments)    Bad dreams   Pravachol [Pravastatin Sodium] Other (See Comments)    Bad dreams    Patient Active Problem List   Diagnosis Date Noted   Precordial chest pain 03/11/2022   Statin myopathy 03/11/2022   Frequent urination 08/04/2021   Persistent shortness of breath after COVID-19 08/06/2020   Arthritis of left acromioclavicular joint 04/04/2019   Right hip pain 03/30/2019   Rotator cuff arthropathy of left shoulder 03/24/2017   Essential hypertension 10/03/2014   Impingement syndrome of left shoulder region 10/03/2014   Routine general medical examination at a health care facility 08/27/2014   Elevated CK 02/02/2014   Impaired fasting glucose 02/17/2012   Atypical chest pain 10/13/2011   GERD 03/17/2009   Mixed hyperlipidemia 02/11/2009   Left knee pain 02/11/2009    Past Medical History:  Diagnosis Date   Arthritis    KNEES   Blood transfusion without reported diagnosis    Cataract    CTS (carpal tunnel syndrome)    BILATERAL LEFT > RIGHT (OCC. HAND NUMBNESS)   Female cystocele    GERD (gastroesophageal reflux disease)    H/O hiatal hernia    History of cervical dysplasia    CIN II   History of peritonitis    2006--  S/P PERFORATED BOWEL INJURY DURING SURGERY   Hyperlipidemia    Hypertension     Past Surgical History:  Procedure Laterality Date   ABDOMINAL HYSTERECTOMY  1991   W/ BILATERAL SALPINGOOPHORECTOMY   BREAST BIOPSY     LAPAROSCOPIC CHOLECYSTECTOMY  2006   W/ PERFERATED BOWEL (INJURY)   NASAL SEPTUM SURGERY  70's   REPAIR DEVIATED SEPTUM   sepsis after gallbladder surgery     TUBAL LIGATION  1980'S   VAGINAL PROLAPSE REPAIR N/A 08/15/2012   Procedure: VAGINAL VAULT SUSPENSION;  Surgeon: Ailene Rud, MD;  Location: [facility];  Service: Urology;  Laterality: N/A;  ANTERIOR VAULT REPAIR WITH XENFORE    Social History   Socioeconomic History   Marital status: Married    Spouse name: Not on file   Number of children: Not on file   Years of education: Not on file   Highest education level: Not on file  Occupational History   Not on file  Tobacco Use   Smoking status: Never   Smokeless tobacco: Never  Vaping Use   Vaping Use: Never used  Substance and Sexual Activity   Alcohol use: No    Alcohol/week: 0.0 standard drinks of alcohol    Comment: no   Drug use: No   Sexual activity: Not Currently    Birth control/protection: Surgical  Other Topics Concern   Not on file  Social History Narrative  Married '68. 2 son - '69, '70; 1 son - '71: 2 grandchildren. 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   Social Determinants of Health   Financial Resource Strain: Low Risk  (06/18/2021)   Overall Financial Resource Strain (CARDIA)    Difficulty of Paying Living Expenses: Not hard at all  Food Insecurity: No Food Insecurity (06/18/2021)   Hunger Vital Sign    Worried About Running Out of Food in the Last Year: Never true    Ran Out of Food in the Last Year: Never true  Transportation Needs: No Transportation Needs (06/18/2021)   PRAPARE - Hydrologist (Medical): No    Lack of Transportation (Non-Medical): No  Physical Activity: Sufficiently Active (06/18/2021)   Exercise Vital Sign    Days of Exercise per Week: 5 days    Minutes of Exercise per Session: 30 min  Stress: No Stress Concern Present (06/18/2021)   [place]    Feeling of Stress : Not at all  Social Connections: Moderately Integrated (06/18/2021)   Social Connection and Isolation Panel [NHANES]    Frequency of Communication with Friends and Family: More than three times a week    Frequency  of Social Gatherings with Friends and Family: Once a week    Attends Religious Services: More than 4 times per year    Active Member of [employer] or Organizations: Yes    Attends Archivist Meetings: More than 4 times per year    Marital Status: Widowed  Intimate Partner Violence: Not At Risk (06/18/2021)   Humiliation, Afraid, Rape, and Kick questionnaire    Fear of Current or Ex-Partner: No    Emotionally Abused: No    Physically Abused: No    Sexually Abused: No    Family History  Problem Relation Age of Onset   Coronary artery disease Mother    Other Mother        cva   Hypertension Mother    Heart disease Mother    Stroke Father    Breast cancer Sister    Pancreatic cancer Brother    Aneurysm Brother        abdominal    Breast cancer Sister    Diabetes Other    Coronary artery disease Other    Hypertension Other    Stroke Other    Cancer Other        breast   Prostate cancer Brother    Colon cancer Neg Hx    Esophageal cancer Neg Hx    Stomach cancer Neg Hx    Liver disease Neg Hx      Review of Systems  Constitutional: Negative.  Negative for chills and fever.  HENT:  Positive for tinnitus. Negative for congestion, ear pain, nosebleeds and sore throat.   Respiratory: Negative.  Negative for cough and shortness of breath.   Cardiovascular: Negative.  Negative for chest pain and palpitations.  Gastrointestinal: Negative.  Negative for abdominal pain, diarrhea, nausea and vomiting.  Genitourinary: Negative.   Skin: Negative.  Negative for rash.  Neurological: Negative.  Negative for dizziness and headaches.  All other systems reviewed and are negative.  Today's Vitals   04/28/22 1439  BP: 132/76  Pulse: 85  Temp: 98.6 F (37 C)  TempSrc: Oral  SpO2: 97%  Weight: 141 lb 6 oz (64.1 kg)  Height: 5' (1.524 m)   Body mass index is 27.61 kg/m.   Physical Exam Vitals reviewed.  Constitutional:      Appearance: Normal appearance.  HENT:      Head: Normocephalic.     Right Ear: Tympanic membrane, ear canal and external ear normal.     Left Ear: Tympanic membrane, ear canal and external ear normal.     Mouth/Throat:     Mouth: Mucous membranes are moist.     Pharynx: Oropharynx is clear.  Eyes:     Extraocular Movements: Extraocular movements intact.     Conjunctiva/sclera: Conjunctivae normal.     Pupils: Pupils are equal, round, and reactive to light.  Cardiovascular:     Rate and Rhythm: Normal rate and regular rhythm.     Pulses: Normal pulses.     Heart sounds: Normal heart sounds.  Pulmonary:     Effort: Pulmonary effort is normal.     Breath sounds: Normal breath sounds.  Musculoskeletal:     Cervical back: No tenderness.  Lymphadenopathy:     Cervical: No cervical adenopathy.  Skin:    General: Skin is warm and dry.  Neurological:     General: No focal deficit present.     Mental Status: She is alert and oriented to person, place, and time.  Psychiatric:        Mood and Affect: Mood normal.        Behavior: Behavior normal.      ASSESSMENT & PLAN: A total of 42 minutes was spent with the patient and counseling/coordination of care regarding preparing for this visit, review of most recent office visit notes and available medical records, review of multiple chronic medical problems and their management, review of all medications, differential diagnosis of bilateral tinnitus and need for ENT evaluation, review of most recent blood work results, analysis, documentation, and need for follow-up.  Problem List Items Addressed This Visit       Cardiovascular and Mediastinum   Essential hypertension    Well-controlled hypertension. Continue hydrochlorothiazide 12.5 mg. BP Readings from Last 3 Encounters:  04/28/22 132/76  03/31/22 108/70  03/24/22 131/77           Other   Tinnitus, bilateral - Primary    May be related to recent new medication. Aspirin also playing a role.  No longer taking it. Needs  ENT evaluation. Referral placed today.      Relevant Orders   Ambulatory referral to ENT   Patient Instructions  Tinnitus Tinnitus refers to hearing a sound when there is no actual source for that sound. This is often described as ringing in the ears. However, people with this condition may hear a variety of noises, in one ear or in both ears. The sounds of tinnitus can be soft, loud, or somewhere in between. Tinnitus can last for a few seconds or can be constant for days. It may go away without treatment and come back at various times. When tinnitus is constant or happens often, it can lead to other problems, such as trouble sleeping and trouble concentrating. Almost everyone experiences tinnitus at some point. Tinnitus is not the same as hearing loss. Tinnitus that is long-lasting (chronic) or comes back often (recurs) may require medical attention. What are the causes? The cause of tinnitus is often not known. In some cases, it can result from: Exposure to loud noises from machinery, music, or other sources. An object (foreign body) stuck in the ear. Earwax buildup. Drinking alcohol or caffeine. Taking certain medicines. Age-related hearing loss. It may also be caused by medical conditions such as: Ear  or sinus infections. Heart diseases or high blood pressure. Allergies. Mnire's disease. Thyroid problems. Tumors. A weak, bulging blood vessel (aneurysm) near the ear. What increases the risk? The following factors may make you more likely to develop this condition: Exposure to loud noises. Age. Tinnitus is more likely in older individuals. Using alcohol or tobacco. What are the signs or symptoms? The main symptom of tinnitus is hearing a sound when there is no source for that sound. It may sound like: Buzzing. Sizzling. Ringing. Blowing air. Hissing. Whistling. Other sounds may include: Roaring. Running water. A musical note. Tapping. Humming. Symptoms may affect  only one ear (unilateral) or both ears (bilateral). How is this diagnosed? Tinnitus is diagnosed based on your symptoms, your medical history, and a physical exam. Your health care provider may do a thorough hearing test (audiologic exam) if your tinnitus: Is unilateral. Causes hearing difficulties. Lasts 6 months or longer. You may work with a health care provider who specializes in hearing disorders (audiologist). You may be asked questions about your symptoms and how they affect your daily life. You may have other tests done, such as: CT scan. MRI. An imaging test of how blood flows through your blood vessels (angiogram). How is this treated? Treating an underlying medical condition can sometimes make tinnitus go away. If your tinnitus continues, other treatments may include: Therapy and counseling to help you manage the stress of living with tinnitus. Sound generators to mask the tinnitus. These include: Tabletop sound machines that play relaxing sounds to help you fall asleep. Wearable devices that fit in your ear and play sounds or music. Acoustic neural stimulation. This involves using headphones to listen to music that contains an auditory signal. Over time, listening to this signal may change some pathways in your brain and make you less sensitive to tinnitus. This treatment is used for very severe cases when no other treatment is working. Using hearing aids or cochlear implants if your tinnitus is related to hearing loss. Hearing aids are worn in the outer ear. Cochlear implants are surgically placed in the inner ear. Follow these instructions at home: Managing symptoms     When possible, avoid being in loud places and being exposed to loud sounds. Wear hearing protection, such as earplugs, when you are exposed to loud noises. Use a white noise machine, a humidifier, or other devices to mask the sound of tinnitus. Practice techniques for reducing stress, such as meditation, yoga,  or deep breathing. Work with your health care provider if you need help with managing stress. Sleep with your head slightly raised. This may reduce the impact of tinnitus. General instructions Do not use stimulants, such as nicotine, alcohol, or caffeine. Talk with your health care provider about other stimulants to avoid. Stimulants are substances that can make you feel alert and attentive by increasing certain activities in the body (such as heart rate and blood pressure). These substances may make tinnitus worse. Take over-the-counter and prescription medicines only as told by your health care provider. Try to get plenty of sleep each night. Keep all follow-up visits. This is important. Contact a health care provider if: Your tinnitus continues for 3 weeks or longer without stopping. You develop sudden hearing loss. Your symptoms get worse or do not get better with home care. You feel you are not able to manage the stress of living with tinnitus. Get help right away if: You develop tinnitus after a head injury. You have tinnitus along with any of the following:  Dizziness. Nausea and vomiting. Loss of balance. Sudden, severe headache. Vision changes. Facial weakness or weakness of arms or legs. These symptoms may represent a serious problem that is an emergency. Do not wait to see if the symptoms will go away. Get medical help right away. Call your local emergency services (911 in the [place] Do not drive yourself to the hospital. Summary Tinnitus refers to hearing a sound when there is no actual source for that sound. This is often described as ringing in the ears. Symptoms may affect only one ear (unilateral) or both ears (bilateral). Use a white noise machine, a humidifier, or other devices to mask the sound of tinnitus. Do not use stimulants, such as nicotine, alcohol, or caffeine. These substances may make tinnitus worse. This information is not intended to replace advice given to  you by your health care provider. Make sure you discuss any questions you have with your health care provider. Document Revised: 05/20/2020 Document Reviewed: 05/20/2020 Elsevier Patient Education  2023 [place], MD [facility]

## 2022-04-28 NOTE — Assessment & Plan Note (Signed)
Well-controlled hypertension. Continue hydrochlorothiazide 12.5 mg. BP Readings from Last 3 Encounters:  04/28/22 132/76  03/31/22 108/70  03/24/22 131/77

## 2022-04-28 NOTE — Assessment & Plan Note (Signed)
May be related to recent new medication. Aspirin also playing a role.  No longer taking it. Needs ENT evaluation. Referral placed today.

## 2022-05-06 ENCOUNTER — Telehealth: Payer: Medicare HMO

## 2022-05-06 NOTE — Telephone Encounter (Signed)
Called pt back for update on tinnitus.Reports still experiencing ringing in both ears is a little better but not much.  Has stopped both zetia and ASA does not want to restart these meds.  Is taking rosuvastatin 3 times weekly.  Had OV with PCP did not find anything alarming in ears.  Referred pt to see ENT pt has not been scheduled. Will send to MD as an FYI.

## 2022-05-20 ENCOUNTER — Other Ambulatory Visit: Payer: Self-pay | Admitting: Internal Medicine

## 2022-05-28 ENCOUNTER — Telehealth: Payer: Self-pay

## 2022-05-28 NOTE — Telephone Encounter (Signed)
Pt called asking about referral that was to be sent in a few weeks ago for ENT due to her not hearing anything.

## 2022-05-28 NOTE — Telephone Encounter (Signed)
Called patient to inquire about the referral to ENT, check chart and no documentation of scheduling her appt. Patient states she will call office in the am, gave patient the office name and number

## 2022-06-05 ENCOUNTER — Ambulatory Visit (HOSPITAL_COMMUNITY)
Admission: RE | Admit: 2022-06-05 | Discharge: 2022-06-05 | Disposition: A | Discharge status: Home / Self Care | Payer: Medicare HMO | Source: Ambulatory Visit | Attending: Family Medicine | Admitting: Family Medicine

## 2022-06-05 ENCOUNTER — Encounter (HOSPITAL_COMMUNITY): Payer: Self-pay

## 2022-06-05 VITALS — BP 141/78 | HR 80 | Temp 98.6°F | Resp 18

## 2022-06-05 DIAGNOSIS — J069 Acute upper respiratory infection, unspecified: Secondary | ICD-10-CM | POA: Diagnosis not present

## 2022-06-05 DIAGNOSIS — R42 Dizziness and giddiness: Secondary | ICD-10-CM

## 2022-06-05 DIAGNOSIS — H6993 Unspecified Eustachian tube disorder, bilateral: Secondary | ICD-10-CM

## 2022-06-05 MED ORDER — METHYLPREDNISOLONE 4 MG PO TBPK: ORAL_TABLET | ORAL | 0 refills | Status: DC

## 2022-06-05 NOTE — Discharge Instructions (Signed)
You were seen today for upper respiratory symptoms.  This appears viral at this time.  I have sent out a prednisone pack to help with sinus congestion, ear fullness and dizziness.  I recommend you get plenty of rest and increase fluids.  If you are not improving then please return for further evaluation if needed.

## 2022-06-05 NOTE — ED Provider Notes (Signed)
[place]    CSN: [id number] Arrival date & time: 06/05/22  1242      History   Chief Complaint Chief Complaint  Patient presents with   appt 1p   Cough   Dizziness   Sore Throat    HPI [name] is a 76 y.o. female.   Patient is here for uri symptoms.  Started 3 days ago with a sore throat, sinus congestion, drainage and cough.  Her ears feel "stuffed up" and ringing, but that has been going on since before.  Also with some dizziness that started yesterday morning.   Room is spinning sensation.  This has improved somewhat. She does have chronic sinus infection.  No fevers, no chills.  No body aches.  She has taken otc medication for the congestion without much help.  Home covid testing was negative 2 days ago.        Past Medical History:  Diagnosis Date   Arthritis    KNEES   Blood transfusion without reported diagnosis    Cataract    CTS (carpal tunnel syndrome)    BILATERAL LEFT > RIGHT (OCC. 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   Rotator cuff arthropathy of left shoulder 03/24/2017   Essential hypertension 10/03/2014   Impingement syndrome of left shoulder region 10/03/2014   Impaired fasting glucose 02/17/2012   GERD 03/17/2009   Mixed hyperlipidemia 02/11/2009    Past Surgical History:  Procedure Laterality Date   ABDOMINAL HYSTERECTOMY  1991   W/ BILATERAL SALPINGOOPHORECTOMY   BREAST BIOPSY     LAPAROSCOPIC CHOLECYSTECTOMY  2006   W/ PERFERATED BOWEL (INJURY)   NASAL SEPTUM SURGERY  70's   REPAIR DEVIATED SEPTUM   sepsis after  gallbladder surgery     TUBAL LIGATION  1980'S   VAGINAL PROLAPSE REPAIR N/A 08/15/2012   Procedure: VAGINAL VAULT SUSPENSION;  Surgeon: Ailene Rud, MD;  Location: [facility];  Service: Urology;  Laterality: N/A;  ANTERIOR VAULT REPAIR WITH XENFORE    OB History     Gravida  3   Para  3   Term  2   Preterm  1   AB  0   Living  3      SAB  0   IAB  0   Ectopic  0   Multiple  0   Live Births  3            Home Medications    Prior to Admission medications   Medication Sig Start Date End Date Taking? Authorizing Provider  acetaminophen (TYLENOL) 500 MG tablet Take 500-750 mg by mouth every 6 (six) hours as needed for pain.    [provider]  alum & mag hydroxide-simeth (MAALOX/MYLANTA) 200-200-20 MG/5ML suspension Take 15 mLs by mouth as needed for indigestion or heartburn.    [provider]  aspirin EC 81 MG tablet Take 1 tablet (81 mg total) by mouth daily. Swallow whole. 03/31/22   Werner Lean, MD  esomeprazole (NEXIUM) 40 MG  capsule Take 1 capsule (40 mg total) by mouth daily. 04/10/19   Hoyt Koch, MD  estradiol (ESTRACE) 0.1 MG/GM vaginal cream Place 1 Applicatorful vaginally 3 (three) times a week. 08/09/20   Caren Macadam, MD  ezetimibe (ZETIA) 10 MG tablet Take 1 tablet (10 mg total) by mouth daily. 03/31/22   Werner Lean, MD  famotidine (PEPCID) 20 MG tablet TAKE 1 TABLET BY MOUTH TWICE A DAY 03/25/22   Hoyt Koch, MD  fluticasone [facility]) 50 MCG/ACT nasal spray Place 2 sprays into both nostrils daily. Patient taking differently: Place 2 sprays into both nostrils daily as needed. 05/16/18   Hoyt Koch, MD  hydrochlorothiazide (HYDRODIURIL) 12.5 MG tablet TAKE 1 TABLET BY MOUTH EVERY DAY 05/20/22   Hoyt Koch, MD  Iron-Vitamins (S.S.S. TONIC PO) Take 1 tablet by mouth once a week.    [provider]  ivabradine (CORLANOR) 7.5 MG  TABS tablet Take 2 hours prior to Cardiac CT 03/16/22   Werner Lean, MD  Multiple Vitamin (MULTIVITAMIN) tablet Take 1 tablet by mouth 3 (three) times a week.    [provider]  nitroGLYCERIN (NITROSTAT) 0.4 MG SL tablet Place 1 tablet (0.4 mg total) under the tongue every 5 (five) minutes as needed for chest pain. 03/11/22   Werner Lean, MD  OVER THE COUNTER MEDICATION Place 1 suppository rectally daily as needed (for hemmorhoids). [place]    [provider]  Probiotic Product (PROBIOTIC-10 ULTIMATE) CAPS Take by mouth daily at 6 (six) AM.    [provider]  rosuvastatin (CRESTOR) 20 MG tablet TAKE 1 TABLET BY MOUTH THREE TIMES A WEEK. 03/03/22   Hoyt Koch, MD  VITAMIN D PO Take 1 tablet by mouth daily.    [provider]  metoprolol tartrate (LOPRESSOR) 100 MG tablet Take 2 hours prior to Cardiac CT 03/11/22 03/16/22  Werner Lean, MD    Family History Family History  Problem Relation Age of Onset   Coronary artery disease Mother    Other Mother        cva   Hypertension Mother    Heart disease Mother    Stroke Father    Breast cancer Sister    Pancreatic cancer Brother    Aneurysm Brother        abdominal    Breast cancer Sister    Diabetes Other    Coronary artery disease Other    Hypertension Other    Stroke Other    Cancer Other        breast   Prostate cancer Brother    Colon cancer Neg Hx    Esophageal cancer Neg Hx    Stomach cancer Neg Hx    Liver disease Neg Hx     Social History Social History   Tobacco Use   Smoking status: Never   Smokeless tobacco: Never  Vaping Use   Vaping Use: Never used  Substance Use Topics   Alcohol use: No    Alcohol/week: 0.0 standard drinks of alcohol    Comment: no   Drug use: No     Allergies   Zocor [simvastatin], Advil [ibuprofen], Bran [wheat bran], Hydrocodone, Hydrocodone-acetaminophen, Metoprolol tartrate, Lipitor [atorvastatin], and  Pravachol [pravastatin sodium]   Review of Systems Review of Systems  Constitutional:  Negative for chills and fever.  HENT:  Positive for congestion, rhinorrhea and tinnitus.   Respiratory:  Positive for cough.   Cardiovascular: Negative.   Gastrointestinal: Negative.  Musculoskeletal: Negative.   Neurological:  Positive for dizziness.  Psychiatric/Behavioral: Negative.       Physical Exam Triage Vital Signs ED Triage Vitals  Enc Vitals Group     BP 06/05/22 1312 (!) 141/78     Pulse Rate 06/05/22 1312 80     Resp 06/05/22 1312 18     Temp 06/05/22 1312 98.6 F (37 C)     Temp Source 06/05/22 1312 Oral     SpO2 06/05/22 1312 98 %     Weight --      Height --      Head Circumference --      Peak Flow --      Pain Score 06/05/22 1311 9     Pain Loc --      Pain Edu? --      Excl. in [place]? --    No data found.  Updated Vital Signs BP (!) 141/78 (BP Location: Left Arm)   Pulse 80   Temp 98.6 F (37 C) (Oral)   Resp 18   SpO2 98%   Visual Acuity Right Eye Distance:   Left Eye Distance:   Bilateral Distance:    Right Eye Near:   Left Eye Near:    Bilateral Near:     Physical Exam Constitutional:      Appearance: She is well-developed.  HENT:     Head: Normocephalic and atraumatic.     Right Ear: A middle ear effusion is present.     Left Ear: A middle ear effusion is present.     Nose: No congestion or rhinorrhea.     Mouth/Throat:     Mouth: Mucous membranes are moist.     Pharynx: No posterior oropharyngeal erythema.  Cardiovascular:     Rate and Rhythm: Normal rate and regular rhythm.     Heart sounds: Normal heart sounds.  Pulmonary:     Effort: Pulmonary effort is normal.  Musculoskeletal:     Cervical back: Normal range of motion and neck supple.  Lymphadenopathy:     Cervical: No cervical adenopathy.  Skin:    General: Skin is warm.  Neurological:     General: No focal deficit present.     Mental Status: She is alert.  Psychiatric:         Mood and Affect: Mood normal.      UC Treatments / Results  Labs (all labs ordered are listed, but only abnormal results are displayed) Labs Reviewed - No data to display  EKG   Radiology No results found.  Procedures Procedures (including critical care time)  Medications Ordered in UC Medications - No data to display  Initial Impression / Assessment and Plan / UC Course  I have reviewed the triage vital signs and the nursing notes.  Pertinent labs & imaging results that were available during my care of the patient were reviewed by me and considered in my medical decision making (see chart for details).    Final Clinical Impressions(s) / UC Diagnoses   Final diagnoses:  Upper respiratory tract infection, unspecified type  Dysfunction of both eustachian tubes  Vertigo     Discharge Instructions      You were seen today for upper respiratory symptoms.  This appears viral at this time.  I have sent out a prednisone pack to help with sinus congestion, ear fullness and dizziness.  I recommend you get plenty of rest and increase fluids.  If you are not improving then please return  for further evaluation if needed.     ED Prescriptions     Medication Sig Dispense Auth. Provider   methylPREDNISolone (MEDROL DOSEPAK) 4 MG TBPK tablet Take as directed 1 each Rondel Oh, MD      PDMP not reviewed this encounter.   Rondel Oh, MD 06/05/22 1332

## 2022-06-05 NOTE — ED Triage Notes (Signed)
Having ears stopped up for several days. Sore throat, congestion and cough for couple days. Took covid test that was negative. Dizzy since yesterday.

## 2022-06-08 ENCOUNTER — Telehealth: Payer: Self-pay | Admitting: Internal Medicine

## 2022-06-08 NOTE — Telephone Encounter (Signed)
Patient called and said she was seen at Urgent care on Friday and was diagnosed with an upper respiratory infection, They prescribed methylPREDNISolone (MEDROL DOSEPAK) 4 MG TBPK tablet. She said she has been taking but feels like she is not feeling as good as she should, She is slightly better. She is taking OTC cough syrup as well. She still has a little bit of a sore throat as well. She was just wanting some advice. Call back is [phone]

## 2022-06-08 NOTE — Telephone Encounter (Signed)
I advised patient to give [place] a call within 3-4 days if not feeling better

## 2022-06-08 NOTE — Telephone Encounter (Signed)
Most URI can take 1-2 weeks to feel better including cough lasting 3-4 weeks potentially. Okay to use over the counter to help. If worsening okay for visit otherwise wait 3-4 days.

## 2022-06-10 ENCOUNTER — Telehealth: Payer: Self-pay

## 2022-06-10 MED ORDER — BENZONATATE 200 MG PO CAPS: 200.0000 mg | ORAL_CAPSULE | Freq: Three times a day (TID) | ORAL | 0 refills | Status: DC | PRN

## 2022-06-10 NOTE — Telephone Encounter (Signed)
Sent in [place] to help with cough. Per notes from ER she is only at about 7 days of symptoms so will assess for other treatment need at visit. Most colds can last 1-2 weeks so this is likely still normal. If any SOB please let me know.

## 2022-06-11 NOTE — Telephone Encounter (Signed)
I spoke with the pt and was able to inform her of the medication sent in and she has stated the medication has helped some. Pt has a OV with her provider on 06/12/2022.  **Pt states she is not having any SOB just nasal drainage and congestion.

## 2022-06-12 ENCOUNTER — Ambulatory Visit (INDEPENDENT_AMBULATORY_CARE_PROVIDER_SITE_OTHER): Payer: Medicare HMO | Admitting: Internal Medicine

## 2022-06-12 ENCOUNTER — Encounter: Payer: Self-pay | Admitting: Internal Medicine

## 2022-06-12 VITALS — BP 128/74 | HR 84 | Temp 98.6°F | Ht 60.0 in | Wt 141.0 lb

## 2022-06-12 DIAGNOSIS — J019 Acute sinusitis, unspecified: Secondary | ICD-10-CM | POA: Diagnosis not present

## 2022-06-12 DIAGNOSIS — J069 Acute upper respiratory infection, unspecified: Secondary | ICD-10-CM

## 2022-06-12 LAB — POC COVID19 BINAXNOW: SARS Coronavirus 2 Ag: NEGATIVE

## 2022-06-12 MED ORDER — AMOXICILLIN-POT CLAVULANATE 875-125 MG PO TABS: 1.0000 | ORAL_TABLET | Freq: Two times a day (BID) | ORAL | 0 refills | Status: AC

## 2022-06-12 MED ORDER — PROMETHAZINE-DM 6.25-15 MG/5ML PO SYRP: 5.0000 mL | ORAL_SOLUTION | Freq: Four times a day (QID) | ORAL | 0 refills | Status: DC | PRN

## 2022-06-12 NOTE — Assessment & Plan Note (Addendum)
Ongoing sinus and cough and mild SOB symptoms for 2-3 weeks. Minimal improvements with prednisone dose pack. She did finish this. Using tessalon perles for cough with minimal improvement. Needs augmentin 1 week course which is prescribed today. Also rx promethazine/dm cough syrup to use at night time for cough so she can sleep better without coughing. POC covid-19 testing done and negative.

## 2022-06-12 NOTE — Patient Instructions (Signed)
We have sent in augmentin to take 1 pill twice a day for 1 week.  We have sent in promethazine cough syrup to use up to twice a day as needed for cough.  The covid-19 test is negative.

## 2022-06-12 NOTE — Progress Notes (Signed)
   Subjective:   Patient ID: [name], female    DOB: [birth year], 76 y.o.   MRN: [id number]  HPI The patient is a 76 YO female coming in for ongoing cough and sinus symptoms.   Review of Systems  Constitutional:  Positive for activity change, appetite change and chills. Negative for fatigue, fever and unexpected weight change.  HENT:  Positive for congestion, postnasal drip, rhinorrhea and sinus pressure. Negative for ear discharge, ear pain, sinus pain, sneezing, sore throat, tinnitus, trouble swallowing and voice change.   Eyes: Negative.   Respiratory:  Positive for cough. Negative for chest tightness, shortness of breath and wheezing.   Cardiovascular: Negative.   Gastrointestinal: Negative.   Musculoskeletal:  Positive for myalgias.  Neurological: Negative.     Objective:  Physical Exam Constitutional:      Appearance: She is well-developed.  HENT:     Head: Normocephalic and atraumatic.     Comments: Oropharynx with redness and clear drainage, nose with swollen turbinates, TMs normal bilaterally.  Neck:     Thyroid: No thyromegaly.  Cardiovascular:     Rate and Rhythm: Normal rate and regular rhythm.  Pulmonary:     Effort: Pulmonary effort is normal. No respiratory distress.     Breath sounds: Normal breath sounds. No wheezing or rales.  Abdominal:     Palpations: Abdomen is soft.  Musculoskeletal:        General: Tenderness present.     Cervical back: Normal range of motion.  Lymphadenopathy:     Cervical: No cervical adenopathy.  Skin:    General: Skin is warm and dry.  Neurological:     Mental Status: She is alert and oriented to person, place, and time.     Vitals:   06/12/22 1156  BP: 128/74  Pulse: 84  Temp: 98.6 F (37 C)  TempSrc: Oral  SpO2: 97%  Weight: 141 lb (64 kg)  Height: 5' (1.524 m)    Assessment & Plan:

## 2022-06-23 ENCOUNTER — Ambulatory Visit: Payer: Medicare HMO

## 2022-07-10 ENCOUNTER — Ambulatory Visit: Payer: Medicare HMO | Attending: Internal Medicine | Admitting: Internal Medicine

## 2022-07-10 ENCOUNTER — Encounter: Payer: Self-pay | Admitting: Internal Medicine

## 2022-07-10 VITALS — BP 122/70 | HR 75 | Ht 60.0 in | Wt 142.8 lb

## 2022-07-10 DIAGNOSIS — I2510 Atherosclerotic heart disease of native coronary artery without angina pectoris: Secondary | ICD-10-CM | POA: Insufficient documentation

## 2022-07-10 DIAGNOSIS — E782 Mixed hyperlipidemia: Secondary | ICD-10-CM

## 2022-07-10 DIAGNOSIS — T466X5D Adverse effect of antihyperlipidemic and antiarteriosclerotic drugs, subsequent encounter: Secondary | ICD-10-CM | POA: Diagnosis not present

## 2022-07-10 DIAGNOSIS — I10 Essential (primary) hypertension: Secondary | ICD-10-CM | POA: Diagnosis not present

## 2022-07-10 DIAGNOSIS — T466X5A Adverse effect of antihyperlipidemic and antiarteriosclerotic drugs, initial encounter: Secondary | ICD-10-CM

## 2022-07-10 DIAGNOSIS — G720 Drug-induced myopathy: Secondary | ICD-10-CM

## 2022-07-10 LAB — LDL CHOLESTEROL, DIRECT: LDL Direct: 124 mg/dL — ABNORMAL HIGH (ref 0–99)

## 2022-07-10 LAB — ALT: ALT: 25 IU/L (ref 0–32)

## 2022-07-10 NOTE — Progress Notes (Signed)
Cardiology Office Note:    Date:  07/10/2022   ID:  [name], DOB [birth year], MRN [id number]  PCP:  Hoyt Koch, MD   [place] [facility] Cardiologist:  Werner Lean, MD     Referring MD: Hoyt Koch, *   CC: CT follow up  History of Present Illness:    [name] is a 77 y.o. female with a hx of HTN, HLD complicated by statin intolerance (elevated CK per chart - she was unclear which CK bad dreams, failed simvastatin, pravastatin, and atorvastatin). 2023: has CT  with mild non-obstructive disease.  Had tinnitus on aspirin.  Started BB.  LDL above goal but was unable to take higher doses of conventional medicine.  Patient notes that she is doing well.   Since last visit notes no further chest pain. Had tinnitus after starting ASA and has stopped this medication.  Felt bad on zetia and rosuvastatin.  Now on rosuvastatin 3X week Needed no PRN nitro   No chest pain or pressure .  No SOB/DOE and no PND/Orthopnea.  No weight gain or leg swelling.  No palpitations or syncope .  Past Medical History:  Diagnosis Date   Arthritis    KNEES   Blood transfusion without reported diagnosis    Cataract    CTS (carpal tunnel syndrome)    BILATERAL LEFT > RIGHT (OCC. HAND NUMBNESS)   Female cystocele    GERD (gastroesophageal reflux disease)    H/O hiatal hernia    History of cervical dysplasia    CIN II   History of peritonitis    2006--  S/P PERFORATED BOWEL INJURY DURING SURGERY   Hyperlipidemia    Hypertension     Past Surgical History:  Procedure Laterality Date   ABDOMINAL HYSTERECTOMY  1991   W/ BILATERAL SALPINGOOPHORECTOMY   BREAST BIOPSY     LAPAROSCOPIC CHOLECYSTECTOMY  2006   W/ PERFERATED BOWEL (INJURY)   NASAL SEPTUM SURGERY  70's   REPAIR DEVIATED SEPTUM   sepsis after gallbladder surgery     TUBAL LIGATION  1980'S   VAGINAL PROLAPSE REPAIR N/A 08/15/2012   Procedure: VAGINAL VAULT SUSPENSION;  Surgeon: Ailene Rud, MD;  Location: [place];  Service: Urology;  Laterality: N/A;  ANTERIOR VAULT REPAIR WITH XENFORE    Current Medications: Current Meds  Medication Sig   acetaminophen (TYLENOL) 500 MG tablet Take 500-750 mg by mouth every 6 (six) hours as needed for pain.   alum & mag hydroxide-simeth (MAALOX/MYLANTA) 200-200-20 MG/5ML suspension Take 15 mLs by mouth as needed for indigestion or heartburn.   esomeprazole (NEXIUM) 40 MG capsule Take 1 capsule (40 mg total) by mouth daily.   estradiol (ESTRACE) 0.1 MG/GM vaginal cream Place 1 Applicatorful vaginally 3 (three) times a week.   famotidine (PEPCID) 20 MG tablet TAKE 1 TABLET BY MOUTH TWICE A DAY   fluticasone (FLONASE) 50 MCG/ACT nasal spray Place 2 sprays into both nostrils daily. (Patient taking differently: Place 2 sprays into both nostrils daily as needed.)   hydrochlorothiazide (HYDRODIURIL) 12.5 MG tablet TAKE 1 TABLET BY MOUTH EVERY DAY   Iron-Vitamins (S.S.S. TONIC PO) Take 1 tablet by mouth once a week.   Multiple Vitamin (MULTIVITAMIN) tablet Take 1 tablet by mouth 3 (three) times a week.   nitroGLYCERIN (NITROSTAT) 0.4 MG SL tablet Place 1 tablet (0.4 mg total) under the tongue every 5 (five) minutes as needed for chest pain.   OVER THE COUNTER MEDICATION  Place 1 suppository rectally daily as needed (for hemmorhoids). Fleet   Probiotic Product (PROBIOTIC-10 ULTIMATE) CAPS Take by mouth daily at 6 (six) AM.   promethazine-dextromethorphan (PROMETHAZINE-DM) 6.25-15 MG/5ML syrup Take 5 mLs by mouth 4 (four) times daily as needed.   rosuvastatin (CRESTOR) 20 MG tablet TAKE 1 TABLET BY MOUTH THREE TIMES A WEEK.   VITAMIN D PO Take 1 tablet by mouth daily.     Allergies:   Zocor [simvastatin], Advil [ibuprofen], Bran [wheat bran], Hydrocodone, Hydrocodone-acetaminophen, Metoprolol tartrate, Lipitor [atorvastatin], and Pravachol [pravastatin sodium]   Social History   Socioeconomic History   Marital status:  Married    Spouse name: Not on file   Number of children: Not on file   Years of education: Not on file   Highest education level: Not on file  Occupational History   Not on file  Tobacco Use   Smoking status: Never   Smokeless tobacco: Never  Vaping Use   Vaping Use: Never used  Substance and Sexual Activity   Alcohol use: No    Alcohol/week: 0.0 standard drinks of alcohol    Comment: no   Drug use: No   Sexual activity: Not Currently    Birth control/protection: Surgical  Other Topics Concern   Not on file  Social History Narrative   Married '68. 2 son - '69, '70; 1 son - '71: 2 grandchildren. 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   Social Determinants of Health   Financial Resource Strain: Low Risk  (06/18/2021)   Overall Financial Resource Strain (CARDIA)    Difficulty of Paying Living Expenses: Not hard at all  Food Insecurity: No Food Insecurity (06/18/2021)   Hunger Vital Sign    Worried About Running Out of Food in the Last Year: Never true    Ran Out of Food in the Last Year: Never true  Transportation Needs: No Transportation Needs (06/18/2021)   PRAPARE - Hydrologist (Medical): No    Lack of Transportation (Non-Medical): No  Physical Activity: Sufficiently Active (06/18/2021)   Exercise Vital Sign    Days of Exercise per Week: 5 days    Minutes of Exercise per Session: 30 min  Stress: No Stress Concern Present (06/18/2021)   [place]    Feeling of Stress : Not at all  Social Connections: Moderately Integrated (06/18/2021)   Social Connection and Isolation Panel [NHANES]    Frequency of Communication with Friends and Family: More than three times a week    Frequency of Social Gatherings with Friends and Family: Once a week    Attends Religious Services: More than 4 times per year    Active Member of [employer] or Organizations: Yes    Attends Theatre manager Meetings: More than 4 times per year    Marital Status: Widowed     Family History: The patient's family history includes Aneurysm in her brother; Breast cancer in her sister and sister; Cancer in an other family member; Coronary artery disease in her mother and another family member; Diabetes in an other family member; Heart disease in her mother; Hypertension in her mother and another family member; Other in her mother; Pancreatic cancer in her brother; Prostate cancer in her brother; Stroke in her father and another family member. There is no history of Colon cancer, Esophageal cancer, Stomach cancer, or Liver disease.  ROS:   Please see the history of present illness.  All other systems reviewed and are negative.  EKGs/Labs/Other Studies Reviewed:    The following studies were reviewed today:  EKG:   03/11/22: SR with 84 with T wave flattening  Cardiac Studies & Procedures          CT SCANS  CT CORONARY MORPH W/CTA COR W/SCORE 03/31/2022  Addendum 03/31/2022  2:51 PM ADDENDUM REPORT: 03/31/2022 14:49  EXAM: OVER-READ INTERPRETATION  CT CHEST  The following report is an over-read performed by radiologist Dr. [facility] on 03/31/2022. This over-read does not include interpretation of cardiac or coronary anatomy or pathology. The coronary calcium score and coronary CTA interpretations by the cardiologist are attached.  COMPARISON:  07/13/2005  FINDINGS: Aorta normal caliber. Atherosclerotic calcifications ascending thoracic aorta. No pericardial effusion. Central pulmonary arteries grossly patent. Esophagus unremarkable. No adenopathy. Visualized upper abdomen unremarkable. Dependent atelectasis BILATERAL lower lobes. Remaining lungs clear. No infiltrate or pleural effusion. Osseous structures unremarkable.  IMPRESSION: Dependent atelectasis BILATERAL lower lobes.  No additional significant extracardiac abnormalities.  Aortic  Atherosclerosis (ICD10-I70.0).   Electronically Signed By: Lavonia Dana M.D. On: 03/31/2022 14:49  Narrative HISTORY: 77 yo female with chest pain, nonspecific  EXAM: Cardiac/Coronary CTA  TECHNIQUE: The patient was scanned on a [employer].  PROTOCOL: A 120 kV prospective scan was triggered in the descending thoracic aorta at 111 HU's. Axial non-contrast 3 mm slices were carried out through the heart. The data set was analyzed on a dedicated work station and scored using the [place]. Gantry rotation speed was 250 msecs and collimation was .6 mm. Beta blockade and 0.8 mg of sl NTG was given. The 3D data set was reconstructed in 5% intervals of the 35-75 % of the R-R cycle. Diastolic phases were analyzed on a dedicated work station using MPR, MIP and VRT modes. The patient received 119m OMNIPAQUE IOHEXOL 350 MG/ML SOLN of contrast.  FINDINGS: Quality: Good, HR 49  Coronary calcium score: The patient's coronary artery calcium score is 87.9, which places the patient in the 56th percentile.  Coronary arteries: Common left coronary ostium.  Right dominance.  Right Coronary Artery: Dominant. Moderate-sized vessel without disease.  Left Main Coronary Artery: Common left coronary ostium.  Left Anterior Descending Coronary Artery: Moderate-sized anterior artery that reaches the apex. There is mild proximal 25-49% mixed stenosis.  Left Circumflex Artery: AV groove LCX - no disease.  Aorta: Normal size, 24 mm at the mid ascending aorta (level of the PA bifurcation) measured double oblique. Aortic atherosclerosis. No dissection.  Aortic Valve: Trileaflet. No calcifications.  Other findings:  Normal pulmonary vein drainage into the left atrium.  Normal left atrial appendage without a thrombus.  Normal size of the pulmonary artery.  IMPRESSION: 1. 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  2. Coronary calcium score of 87.9. This was 56th  percentile for age and sex matched control.  3. Common left coronary ostium with right dominance.  4. Consider non-coronary causes of chest pain.  Electronically Signed: By: KPixie CasinoM.D. On: 03/31/2022 12:40            Recent Labs: 02/19/2022: ALT 22; Hemoglobin 13.8; Platelets 263.0 03/11/2022: BUN 13; Creatinine, Ser 0.81; Potassium 4.0; Sodium 142  Recent Lipid Panel    Component Value Date/Time   CHOL 208 (H) 02/19/2022 1045   CHOL 272 (H) 05/22/2014 0956   TRIG 116.0 02/19/2022 1045   TRIG 111 05/22/2014 0956   HDL 63.50 02/19/2022 1045   HDL 67 05/22/2014 0956  CHOLHDL 3 02/19/2022 1045   VLDL 23.2 02/19/2022 1045   LDLCALC 122 (H) 02/19/2022 1045   LDLCALC 183 (H) 05/22/2014 0956   LDLDIRECT 175.3 04/24/2008 0848       Physical Exam:    VS:  BP 122/70   Pulse 75   Ht 5' (1.524 m)   Wt 142 lb 12.8 oz (64.8 kg)   SpO2 96%   BMI 27.89 kg/m     Wt Readings from Last 3 Encounters:  07/10/22 142 lb 12.8 oz (64.8 kg)  06/12/22 141 lb (64 kg)  04/28/22 141 lb 6 oz (64.1 kg)    Gen: No distress   Neck: No JVD Cardiac: No Rubs or Gallops, No murmur, RRR +2 radial pulses Respiratory: Clear to auscultation bilaterally, normal effort, normal  respiratory rate GI: Soft, nontender, non-distended  MS: No  edema;  moves all extremities Integument: Skin feels warm Neuro:  At time of evaluation, alert and oriented to person/place/time/situation  Psych: Normal affect, patient feels well  ASSESSMENT:    1. Coronary artery disease involving native coronary artery of native heart without angina pectoris   2. Mixed hyperlipidemia   3. Essential hypertension   4. Statin myopathy     PLAN:    Mild CAD HLD complicated by statin intolerance (pravastatin, simvastatin, atorvastatin) - query of ASA tinnitus; will not add antiplatelet at this time; pending ENT eval - reviewed CT images - continue statin - LDL direct and ALT today; if elevated we will send her  some info on PCK29i and if interested will do lipid clinic  - no PRN nitro needed  HTN - BP controlled on HCTZ      One year me or APP    Medication Adjustments/Labs and Tests Ordered: Current medicines are reviewed at length with the patient today.  Concerns regarding medicines are outlined above.  Orders Placed This Encounter  Procedures   ALT   LDL cholesterol, direct   No orders of the defined types were placed in this encounter.   Patient Instructions  Medication Instructions:  Your physician recommends that you continue on your current medications as directed. Please refer to the Current Medication list given to you today.  *If you need a refill on your cardiac medications before your next appointment, please call your pharmacy*   Lab Work: TODAY: LDL Direct, ALT  If you have labs (blood work) drawn today and your tests are completely normal, you will receive your results only by: [place] (if you have MyChart) OR A paper copy in the mail If you have any lab test that is abnormal or we need to change your treatment, we will call you to review the results.   Testing/Procedures: NONE   Follow-Up: At [facility], you and your health needs are our priority.  As part of our continuing mission to provide you with exceptional heart care, we have created designated Provider Care Teams.  These Care Teams include your primary Cardiologist (physician) and Advanced Practice Providers (APPs -  Physician Assistants and Nurse Practitioners) who all work together to provide you with the care you need, when you need it.     Your next appointment:   1 year(s)  Provider:   Werner Lean, MD       Signed, Werner Lean, MD  07/10/2022 10:42 AM    [place]

## 2022-07-10 NOTE — Patient Instructions (Signed)
Medication Instructions:  Your physician recommends that you continue on your current medications as directed. Please refer to the Current Medication list given to you today.  *If you need a refill on your cardiac medications before your next appointment, please call your pharmacy*   Lab Work: TODAY: LDL Direct, ALT  If you have labs (blood work) drawn today and your tests are completely normal, you will receive your results only by: [place] (if you have MyChart) OR A paper copy in the mail If you have any lab test that is abnormal or we need to change your treatment, we will call you to review the results.   Testing/Procedures: NONE   Follow-Up: At [facility], you and your health needs are our priority.  As part of our continuing mission to provide you with exceptional heart care, we have created designated Provider Care Teams.  These Care Teams include your primary Cardiologist (physician) and Advanced Practice Providers (APPs -  Physician Assistants and Nurse Practitioners) who all work together to provide you with the care you need, when you need it.     Your next appointment:   1 year(s)  Provider:   Werner Lean, MD

## 2022-07-14 ENCOUNTER — Encounter: Payer: Self-pay | Admitting: Family Medicine

## 2022-07-14 ENCOUNTER — Telehealth (INDEPENDENT_AMBULATORY_CARE_PROVIDER_SITE_OTHER): Payer: Medicare HMO | Admitting: Family Medicine

## 2022-07-14 DIAGNOSIS — R051 Acute cough: Secondary | ICD-10-CM | POA: Diagnosis not present

## 2022-07-14 DIAGNOSIS — U071 COVID-19: Secondary | ICD-10-CM | POA: Diagnosis not present

## 2022-07-14 MED ORDER — MOLNUPIRAVIR 200 MG PO CAPS: 4.0000 | ORAL_CAPSULE | Freq: Two times a day (BID) | ORAL | 0 refills | Status: DC

## 2022-07-14 NOTE — Progress Notes (Signed)
MyChart Video Visit    Virtual Visit via Video Note   This visit type was conducted due to national recommendations for restrictions regarding the COVID-19 Pandemic (e.g. social distancing) in an effort to limit this patient's exposure and mitigate transmission in our community. This patient is at least at moderate risk for complications without adequate follow up. This format is felt to be most appropriate for this patient at this time. Physical exam was limited by quality of the video and audio technology used for the visit. CMA was able to get the patient set up on a video visit.  Patient location: Home. Patient and provider in visit Provider location: Office  I discussed the limitations of evaluation and management by telemedicine and the availability of in person appointments. The patient expressed understanding and agreed to proceed.  Visit Date: 07/14/2022  Today's healthcare provider: Harland Dingwall, NP-C     Subjective:    Patient ID: [name], female    DOB: Apr 08, 1946, 77 y.o.   MRN: [id number]  Chief Complaint  Patient presents with   Covid Positive    Expired covid test, positive 1/15 Sx: cough started yesterday, sore throat    HPI Positive Covid test yesterday. Symptoms started yesterday.   C/o low grade fever, sore throat, and cough.   She took Tylenol 1,000 mg.   Denies chills, dizziness, chest pain, palpitations, shortness of breath, abdominal pain, N/V/D.   Hx of Covid in 2022.  She has been fully vaccinated for Covid.     Past Medical History:  Diagnosis Date   Arthritis    KNEES   Blood transfusion without reported diagnosis    Cataract    CTS (carpal tunnel syndrome)    BILATERAL LEFT > RIGHT (OCC. HAND NUMBNESS)   Female cystocele    GERD (gastroesophageal reflux disease)    H/O hiatal hernia    History of cervical dysplasia    CIN II   History of peritonitis    2006--  S/P PERFORATED BOWEL INJURY DURING SURGERY   Hyperlipidemia     Hypertension     Past Surgical History:  Procedure Laterality Date   ABDOMINAL HYSTERECTOMY  1991   W/ BILATERAL SALPINGOOPHORECTOMY   BREAST BIOPSY     LAPAROSCOPIC CHOLECYSTECTOMY  2006   W/ PERFERATED BOWEL (INJURY)   NASAL SEPTUM SURGERY  70's   REPAIR DEVIATED SEPTUM   sepsis after gallbladder surgery     TUBAL LIGATION  1980'S   VAGINAL PROLAPSE REPAIR N/A 08/15/2012   Procedure: VAGINAL VAULT SUSPENSION;  Surgeon: Ailene Rud, MD;  Location: [place];  Service: Urology;  Laterality: N/A;  ANTERIOR VAULT REPAIR WITH XENFORE    Family History  Problem Relation Age of Onset   Coronary artery disease Mother    Other Mother        cva   Hypertension Mother    Heart disease Mother    Stroke Father    Breast cancer Sister    Pancreatic cancer Brother    Aneurysm Brother        abdominal    Breast cancer Sister    Diabetes Other    Coronary artery disease Other    Hypertension Other    Stroke Other    Cancer Other        breast   Prostate cancer Brother    Colon cancer Neg Hx    Esophageal cancer Neg Hx    Stomach cancer Neg Hx  Liver disease Neg Hx     Social History   Socioeconomic History   Marital status: Married    Spouse name: Not on file   Number of children: Not on file   Years of education: Not on file   Highest education level: Not on file  Occupational History   Not on file  Tobacco Use   Smoking status: Never   Smokeless tobacco: Never  Vaping Use   Vaping Use: Never used  Substance and Sexual Activity   Alcohol use: No    Alcohol/week: 0.0 standard drinks of alcohol    Comment: no   Drug use: No   Sexual activity: Not Currently    Birth control/protection: Surgical  Other Topics Concern   Not on file  Social History Narrative   Married '68. 2 son - '69, '70; 1 son - '71: 2 grandchildren. 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   Social Determinants of Health   Financial Resource Strain: Low  Risk  (06/18/2021)   Overall Financial Resource Strain (CARDIA)    Difficulty of Paying Living Expenses: Not hard at all  Food Insecurity: No Food Insecurity (06/18/2021)   Hunger Vital Sign    Worried About Running Out of Food in the Last Year: Never true    Ran Out of Food in the Last Year: Never true  Transportation Needs: No Transportation Needs (06/18/2021)   PRAPARE - Hydrologist (Medical): No    Lack of Transportation (Non-Medical): No  Physical Activity: Sufficiently Active (06/18/2021)   Exercise Vital Sign    Days of Exercise per Week: 5 days    Minutes of Exercise per Session: 30 min  Stress: No Stress Concern Present (06/18/2021)   [place]    Feeling of Stress : Not at all  Social Connections: Moderately Integrated (06/18/2021)   Social Connection and Isolation Panel [NHANES]    Frequency of Communication with Friends and Family: More than three times a week    Frequency of Social Gatherings with Friends and Family: Once a week    Attends Religious Services: More than 4 times per year    Active Member of [employer] or Organizations: Yes    Attends Archivist Meetings: More than 4 times per year    Marital Status: Widowed  Intimate Partner Violence: Not At Risk (06/18/2021)   Humiliation, Afraid, Rape, and Kick questionnaire    Fear of Current or Ex-Partner: No    Emotionally Abused: No    Physically Abused: No    Sexually Abused: No    Outpatient Medications Prior to Visit  Medication Sig Dispense Refill   acetaminophen (TYLENOL) 500 MG tablet Take 500-750 mg by mouth every 6 (six) hours as needed for pain.     alum & mag hydroxide-simeth (MAALOX/MYLANTA) 200-200-20 MG/5ML suspension Take 15 mLs by mouth as needed for indigestion or heartburn.     esomeprazole (NEXIUM) 40 MG capsule Take 1 capsule (40 mg total) by mouth daily. 90 capsule 3   estradiol (ESTRACE) 0.1  MG/GM vaginal cream Place 1 Applicatorful vaginally 3 (three) times a week. 42.5 g 3   famotidine (PEPCID) 20 MG tablet TAKE 1 TABLET BY MOUTH TWICE A DAY 60 tablet 1   fluticasone (FLONASE) 50 MCG/ACT nasal spray Place 2 sprays into both nostrils daily. (Patient taking differently: Place 2 sprays into both nostrils daily as needed.) 48 g 6   hydrochlorothiazide (HYDRODIURIL) 12.5 MG tablet TAKE  1 TABLET BY MOUTH EVERY DAY 90 tablet 3   Iron-Vitamins (S.S.S. TONIC PO) Take 1 tablet by mouth once a week.     Multiple Vitamin (MULTIVITAMIN) tablet Take 1 tablet by mouth 3 (three) times a week.     nitroGLYCERIN (NITROSTAT) 0.4 MG SL tablet Place 1 tablet (0.4 mg total) under the tongue every 5 (five) minutes as needed for chest pain. 25 tablet 3   OVER THE COUNTER MEDICATION Place 1 suppository rectally daily as needed (for hemmorhoids). Fleet     Probiotic Product (PROBIOTIC-10 ULTIMATE) CAPS Take by mouth daily at 6 (six) AM.     rosuvastatin (CRESTOR) 20 MG tablet TAKE 1 TABLET BY MOUTH THREE TIMES A WEEK. 36 tablet 3   VITAMIN D PO Take 1 tablet by mouth daily.     promethazine-dextromethorphan (PROMETHAZINE-DM) 6.25-15 MG/5ML syrup Take 5 mLs by mouth 4 (four) times daily as needed. (Patient not taking: Reported on 07/14/2022) 118 mL 0   No facility-administered medications prior to visit.    Allergies  Allergen Reactions   Zocor [Simvastatin]     12/2013 CK 528   Advil [Ibuprofen] Swelling    Tongue swells   Bran [Wheat Bran] Other (See Comments)    Very bad GI pain   Hydrocodone Nausea Only and Other (See Comments)     TACHYCARDIA   Hydrocodone-Acetaminophen Nausea Only    Other reaction(s): Increased Heart Rate (intolerance)   Metoprolol Tartrate Tinitus   Lipitor [Atorvastatin] Other (See Comments)    Bad dreams   Pravachol [Pravastatin Sodium] Other (See Comments)    Bad dreams    ROS     Objective:    Physical Exam  There were no vitals taken for this visit. Wt  Readings from Last 3 Encounters:  07/10/22 142 lb 12.8 oz (64.8 kg)  06/12/22 141 lb (64 kg)  04/28/22 141 lb 6 oz (64.1 kg)   Alert and oriented and in no acute distress.  Respirations unlabored.  Speaking in complete sentences without difficulty.  Normal speech, memory and mood.    Assessment & Plan:   Problem List Items Addressed This Visit   None Visit Diagnoses     COVID-19 virus infection    -  Primary   Relevant Medications   molnupiravir EUA (LAGEVRIO) 200 MG CAPS capsule   Acute cough          She is not in any acute distress. Molnupiravir prescribed.  Counseling on how to take the medication and potential side effects.  Counseling on symptomatic management.  Tylenol and Mucinex as needed.  She has Promethazine DM cough syrup at home and is taking this as well.  States she may need a refill soon.  She will let us know.  Counseling on guidelines for quarantine and isolation.  Follow-up as needed.  I have discontinued [employer]'s promethazine-dextromethorphan. I am also having her start on molnupiravir EUA. Additionally, I am having her maintain her multivitamin, acetaminophen, OVER THE COUNTER MEDICATION, fluticasone, esomeprazole, alum & mag hydroxide-simeth, estradiol, Iron-Vitamins (S.S.S. TONIC PO), VITAMIN D PO, rosuvastatin, Probiotic-10 Ultimate, nitroGLYCERIN, famotidine, and hydrochlorothiazide.  Meds ordered this encounter  Medications   molnupiravir EUA (LAGEVRIO) 200 MG CAPS capsule    Sig: Take 4 capsules (800 mg total) by mouth 2 (two) times daily for 5 days.    Dispense:  40 capsule    Refill:  0    Order Specific Question:   Supervising Provider    Answer:   CRAWFORD,  ELIZABETH A [[id number]]    I discussed the assessment and treatment plan with the patient. The patient was provided an opportunity to ask questions and all were answered. The patient agreed with the plan and demonstrated an understanding of the instructions.   The patient was advised to call  back or seek an in-person evaluation if the symptoms worsen or if the condition fails to improve as anticipated.  I provided 20 minutes of face-to-face time during this encounter.   Harland Dingwall, NP-C [facility] [place] [phone] (phone) [phone] (fax)  [place]

## 2022-07-15 ENCOUNTER — Telehealth: Payer: Self-pay | Admitting: Internal Medicine

## 2022-07-15 NOTE — Telephone Encounter (Signed)
Pt believes COVID medication that was sent in yesterday is too strong for her as she felt her head was full and her legs felt stiff. She is requesting something else be sent in for her

## 2022-07-15 NOTE — Telephone Encounter (Signed)
Pt reports she stopped taking the medication last night and feels a lot better. She says she has taken prednisone in the past and it helped and is wondering if that's something that can be sent in?

## 2022-07-15 NOTE — Telephone Encounter (Signed)
Patient called and stated that the medication given to her for her COVID, lagevrio '200mg'$ , is too strong for her. She stated last night she felt really bad stating her legs felt stiff and tired and it felt like her head was full. She would like to know if something else could be prescribed for her. She would like a callback at [phone].

## 2022-07-15 NOTE — Telephone Encounter (Signed)
Patient called back regarding this because she can't take the medication that was sent in for covid and said that when she had covid before she had taken prednisone and she wanted to see what Dr Sharlet Salina thought. Call back is [phone]

## 2022-07-15 NOTE — Telephone Encounter (Signed)
Relayed this to pt, pt verbalized understanding.

## 2022-07-16 ENCOUNTER — Ambulatory Visit (INDEPENDENT_AMBULATORY_CARE_PROVIDER_SITE_OTHER): Payer: Medicare HMO

## 2022-07-16 VITALS — Ht 60.0 in | Wt 143.0 lb

## 2022-07-16 DIAGNOSIS — Z0000 Encounter for general adult medical examination without abnormal findings: Secondary | ICD-10-CM | POA: Diagnosis not present

## 2022-07-16 NOTE — Telephone Encounter (Signed)
We could try a different cough medicine if desired. Prednisone can suppress the immune system so we usually do not use it until 1-2 weeks after the illness starts. In the first week or so her immune system needs to be strong to help fight the covid-19 infection.

## 2022-07-16 NOTE — Telephone Encounter (Signed)
Pt states she does have a cough and her chest is bothering her from coughing so much. She wanting to if she can get the prednisone to help with the cough as she was given the tesslon pearls and prednisone to help her with her cough last time. Pt was inquiring why this same regime can not be given to help her.  **I was able to sched a VV with Dr. Sharlet Salina for 07/17/2022 at 1:40 for the pt.

## 2022-07-16 NOTE — Telephone Encounter (Signed)
I would advise the same as the provider who saw her for this. Covid-19 can take 2-3 weeks to resolve. If feeling worse, any problems breathing let us know.

## 2022-07-16 NOTE — Patient Instructions (Signed)
[name] , Thank you for taking time to come for your Medicare Wellness Visit. I appreciate your ongoing commitment to your health goals. Please review the following plan we discussed and let me know if I can assist you in the future.   These are the goals we discussed:  Goals      My healthcare goal for 2024 is to maintain my current health status by continuing to eat healthy, stay physically active and socially active.        This is a list of the screening recommended for you and due dates:  Health Maintenance  Topic Date Due   Zoster (Shingles) Vaccine (1 of 2) 07/29/2022*   Medicare Annual Wellness Visit  07/17/2023   Colon Cancer Screening  04/22/2026   DTaP/Tdap/Td vaccine (3 - Td or Tdap) 10/24/2031   Pneumonia Vaccine  Completed   Flu Shot  Completed   DEXA scan (bone density measurement)  Completed   COVID-19 Vaccine  Completed   Hepatitis C Screening: USPSTF Recommendation to screen - Ages 72-79 yo.  Completed   HPV Vaccine  Aged Out  *Topic was postponed. The date shown is not the original due date.    Advanced directives: No; Advance directive discussed with you today. Even though you declined this today please call our office should you change your mind and we can give you the proper paperwork for you to fill out.  Conditions/risks identified: Yes  Next appointment: Follow up in one year for your annual wellness visit.   Preventive Care 73 Years and Older, Female Preventive care refers to lifestyle choices and visits with your health care provider that can promote health and wellness. What does preventive care include? A yearly physical exam. This is also called an annual well check. Dental exams once or twice a year. Routine eye exams. Ask your health care provider how often you should have your eyes checked. Personal lifestyle choices, including: Daily care of your teeth and gums. Regular physical activity. Eating a healthy diet. Avoiding tobacco and drug  use. Limiting alcohol use. Practicing safe sex. Taking low-dose aspirin every day. Taking vitamin and mineral supplements as recommended by your health care provider. What happens during an annual well check? The services and screenings done by your health care provider during your annual well check will depend on your age, overall health, lifestyle risk factors, and family history of disease. Counseling  Your health care provider may ask you questions about your: Alcohol use. Tobacco use. Drug use. Emotional well-being. Home and relationship well-being. Sexual activity. Eating habits. History of falls. Memory and ability to understand (cognition). Work and work Statistician. Reproductive health. Screening  You may have the following tests or measurements: Height, weight, and BMI. Blood pressure. Lipid and cholesterol levels. These may be checked every 5 years, or more frequently if you are over 64 years old. Skin check. Lung cancer screening. You may have this screening every year starting at age 45 if you have a 30-pack-year history of smoking and currently smoke or have quit within the past 15 years. Fecal occult blood test (FOBT) of the stool. You may have this test every year starting at age 88. Flexible sigmoidoscopy or colonoscopy. You may have a sigmoidoscopy every 5 years or a colonoscopy every 10 years starting at age 38. Hepatitis C blood test. Hepatitis B blood test. Sexually transmitted disease (STD) testing. Diabetes screening. This is done by checking your blood sugar (glucose) after you have not eaten for a  while (fasting). You may have this done every 1-3 years. Bone density scan. This is done to screen for osteoporosis. You may have this done starting at age 36. Mammogram. This may be done every 1-2 years. Talk to your health care provider about how often you should have regular mammograms. Talk with your health care provider about your test results, treatment  options, and if necessary, the need for more tests. Vaccines  Your health care provider may recommend certain vaccines, such as: Influenza vaccine. This is recommended every year. Tetanus, diphtheria, and acellular pertussis (Tdap, Td) vaccine. You may need a Td booster every 10 years. Zoster vaccine. You may need this after age 74. Pneumococcal 13-valent conjugate (PCV13) vaccine. One dose is recommended after age 47. Pneumococcal polysaccharide (PPSV23) vaccine. One dose is recommended after age 53. Talk to your health care provider about which screenings and vaccines you need and how often you need them. This information is not intended to replace advice given to you by your health care provider. Make sure you discuss any questions you have with your health care provider. Document Released: 07/12/2015 Document Revised: 03/04/2016 Document Reviewed: 04/16/2015 Elsevier Interactive Patient Education  2017 [place] Prevention in the Home Falls can cause injuries. They can happen to people of all ages. There are many things you can do to make your home safe and to help prevent falls. What can I do on the outside of my home? Regularly fix the edges of walkways and driveways and fix any cracks. Remove anything that might make you trip as you walk through a door, such as a raised step or threshold. Trim any bushes or trees on the path to your home. Use bright outdoor lighting. Clear any walking paths of anything that might make someone trip, such as rocks or tools. Regularly check to see if handrails are loose or broken. Make sure that both sides of any steps have handrails. Any raised decks and porches should have guardrails on the edges. Have any leaves, snow, or ice cleared regularly. Use sand or salt on walking paths during winter. Clean up any spills in your garage right away. This includes oil or grease spills. What can I do in the bathroom? Use night lights. Install grab  bars by the toilet and in the tub and shower. Do not use towel bars as grab bars. Use non-skid mats or decals in the tub or shower. If you need to sit down in the shower, use a plastic, non-slip stool. Keep the floor dry. Clean up any water that spills on the floor as soon as it happens. Remove soap buildup in the tub or shower regularly. Attach bath mats securely with double-sided non-slip rug tape. Do not have throw rugs and other things on the floor that can make you trip. What can I do in the bedroom? Use night lights. Make sure that you have a light by your bed that is easy to reach. Do not use any sheets or blankets that are too big for your bed. They should not hang down onto the floor. Have a firm chair that has side arms. You can use this for support while you get dressed. Do not have throw rugs and other things on the floor that can make you trip. What can I do in the kitchen? Clean up any spills right away. Avoid walking on wet floors. Keep items that you use a lot in easy-to-reach places. If you need to reach something above you,  use a strong step stool that has a grab bar. Keep electrical cords out of the way. Do not use floor polish or wax that makes floors slippery. If you must use wax, use non-skid floor wax. Do not have throw rugs and other things on the floor that can make you trip. What can I do with my stairs? Do not leave any items on the stairs. Make sure that there are handrails on both sides of the stairs and use them. Fix handrails that are broken or loose. Make sure that handrails are as long as the stairways. Check any carpeting to make sure that it is firmly attached to the stairs. Fix any carpet that is loose or worn. Avoid having throw rugs at the top or bottom of the stairs. If you do have throw rugs, attach them to the floor with carpet tape. Make sure that you have a light switch at the top of the stairs and the bottom of the stairs. If you do not have them,  ask someone to add them for you. What else can I do to help prevent falls? Wear shoes that: Do not have high heels. Have rubber bottoms. Are comfortable and fit you well. Are closed at the toe. Do not wear sandals. If you use a stepladder: Make sure that it is fully opened. Do not climb a closed stepladder. Make sure that both sides of the stepladder are locked into place. Ask someone to hold it for you, if possible. Clearly mark and make sure that you can see: Any grab bars or handrails. First and last steps. Where the edge of each step is. Use tools that help you move around (mobility aids) if they are needed. These include: Canes. Walkers. Scooters. Crutches. Turn on the lights when you go into a dark area. Replace any light bulbs as soon as they burn out. Set up your furniture so you have a clear path. Avoid moving your furniture around. If any of your floors are uneven, fix them. If there are any pets around you, be aware of where they are. Review your medicines with your doctor. Some medicines can make you feel dizzy. This can increase your chance of falling. Ask your doctor what other things that you can do to help prevent falls. This information is not intended to replace advice given to you by your health care provider. Make sure you discuss any questions you have with your health care provider. Document Released: 04/11/2009 Document Revised: 11/21/2015 Document Reviewed: 07/20/2014 Elsevier Interactive Patient Education  2017 [employer].

## 2022-07-16 NOTE — Progress Notes (Signed)
Virtual Visit via Telephone Note  I connected with  [name] on 07/16/22 at  9:00 AM EST by telephone and verified that I am speaking with the correct person using two identifiers.  Location: Patient: Home Provider: [place] Persons participating in the virtual visit: [place]   I discussed the limitations, risks, security and privacy concerns of performing an evaluation and management service by telephone and the availability of in person appointments. The patient expressed understanding and agreed to proceed.  Interactive audio and video telecommunications were attempted between this nurse and patient, however failed, due to patient having technical difficulties OR patient did not have access to video capability.  We continued and completed visit with audio only.  Some vital signs may be absent or patient reported.   Sheral Flow, LPN  Subjective:   [name] is a 77 y.o. female who presents for Medicare Annual (Subsequent) preventive examination.  Review of Systems     Cardiac Risk Factors include: advanced age (>78mn, >>60women);dyslipidemia;family history of premature cardiovascular disease;hypertension;sedentary lifestyle     Objective:    Today's Vitals   07/16/22 0903  Weight: 143 lb (64.9 kg)  Height: 5' (1.524 m)  PainSc: 0-No pain   Body mass index is 27.93 kg/m.     07/16/2022    9:09 AM 06/18/2021    2:24 PM 05/10/2020   11:14 AM 07/26/2016   11:20 PM 07/18/2015    9:22 AM 11/19/2012    2:28 AM 08/15/2012    8:59 AM  Advanced Directives  Does Patient Have a Medical Advance Directive? No No No No Yes Patient does not have advance directive;Patient would not like information Patient does not have advance directive;Patient would not like information  [place]?     Yes    Would patient like information on creating a medical advance directive? No - Patient declined No - Patient  declined No - Patient declined No - Patient declined     Pre-existing out of facility DNR order (yellow form or pink MOST form)      No No    Current Medications (verified) Outpatient Encounter Medications as of 07/16/2022  Medication Sig   acetaminophen (TYLENOL) 500 MG tablet Take 500-750 mg by mouth every 6 (six) hours as needed for pain.   alum & mag hydroxide-simeth (MAALOX/MYLANTA) 200-200-20 MG/5ML suspension Take 15 mLs by mouth as needed for indigestion or heartburn.   esomeprazole (NEXIUM) 40 MG capsule Take 1 capsule (40 mg total) by mouth daily.   estradiol (ESTRACE) 0.1 MG/GM vaginal cream Place 1 Applicatorful vaginally 3 (three) times a week.   famotidine (PEPCID) 20 MG tablet TAKE 1 TABLET BY MOUTH TWICE A DAY   fluticasone (FLONASE) 50 MCG/ACT nasal spray Place 2 sprays into both nostrils daily. (Patient taking differently: Place 2 sprays into both nostrils daily as needed.)   hydrochlorothiazide (HYDRODIURIL) 12.5 MG tablet TAKE 1 TABLET BY MOUTH EVERY DAY   Iron-Vitamins (S.S.S. TONIC PO) Take 1 tablet by mouth once a week.   molnupiravir EUA (LAGEVRIO) 200 MG CAPS capsule Take 4 capsules (800 mg total) by mouth 2 (two) times daily for 5 days.   Multiple Vitamin (MULTIVITAMIN) tablet Take 1 tablet by mouth 3 (three) times a week.   nitroGLYCERIN (NITROSTAT) 0.4 MG SL tablet Place 1 tablet (0.4 mg total) under the tongue every 5 (five) minutes as needed for chest pain.   OVER THE COUNTER MEDICATION Place  1 suppository rectally daily as needed (for hemmorhoids). Fleet   Probiotic Product (PROBIOTIC-10 ULTIMATE) CAPS Take by mouth daily at 6 (six) AM.   rosuvastatin (CRESTOR) 20 MG tablet TAKE 1 TABLET BY MOUTH THREE TIMES A WEEK.   VITAMIN D PO Take 1 tablet by mouth daily.   No facility-administered encounter medications on file as of 07/16/2022.    Allergies (verified) Zocor [simvastatin], Advil [ibuprofen], Bran [wheat bran], Hydrocodone, Hydrocodone-acetaminophen,  Metoprolol tartrate, Lipitor [atorvastatin], and Pravachol [pravastatin sodium]   History: Past Medical History:  Diagnosis Date   Arthritis    KNEES   Blood transfusion without reported diagnosis    Cataract    CTS (carpal tunnel syndrome)    BILATERAL LEFT > RIGHT (OCC. HAND NUMBNESS)   Female cystocele    GERD (gastroesophageal reflux disease)    H/O hiatal hernia    History of cervical dysplasia    CIN II   History of peritonitis    2006--  S/P PERFORATED BOWEL INJURY DURING SURGERY   Hyperlipidemia    Hypertension    Past Surgical History:  Procedure Laterality Date   ABDOMINAL HYSTERECTOMY  1991   W/ BILATERAL SALPINGOOPHORECTOMY   BREAST BIOPSY     LAPAROSCOPIC CHOLECYSTECTOMY  2006   W/ PERFERATED BOWEL (INJURY)   NASAL SEPTUM SURGERY  70's   REPAIR DEVIATED SEPTUM   sepsis after gallbladder surgery     TUBAL LIGATION  1980'S   VAGINAL PROLAPSE REPAIR N/A 08/15/2012   Procedure: VAGINAL VAULT SUSPENSION;  Surgeon: Ailene Rud, MD;  Location: [facility];  Service: Urology;  Laterality: N/A;  ANTERIOR VAULT REPAIR WITH XENFORE   Family History  Problem Relation Age of Onset   Coronary artery disease Mother    Other Mother        cva   Hypertension Mother    Heart disease Mother    Stroke Father    Breast cancer Sister    Pancreatic cancer Brother    Aneurysm Brother        abdominal    Breast cancer Sister    Diabetes Other    Coronary artery disease Other    Hypertension Other    Stroke Other    Cancer Other        breast   Prostate cancer Brother    Colon cancer Neg Hx    Esophageal cancer Neg Hx    Stomach cancer Neg Hx    Liver disease Neg Hx    Social History   Socioeconomic History   Marital status: Married    Spouse name: Not on file   Number of children: Not on file   Years of education: Not on file   Highest education level: Not on file  Occupational History   Not on file  Tobacco Use   Smoking status: Never    Smokeless tobacco: Never  Vaping Use   Vaping Use: Never used  Substance and Sexual Activity   Alcohol use: No    Alcohol/week: 0.0 standard drinks of alcohol    Comment: no   Drug use: No   Sexual activity: Not Currently    Birth control/protection: Surgical  Other Topics Concern   Not on file  Social History Narrative   Married '68. 2 son - '69, '70; 1 son - '71: 2 grandchildren. 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   Social Determinants of Health   Financial Resource Strain: Low Risk  (07/16/2022)   Overall Financial Resource Strain (CARDIA)  Difficulty of Paying Living Expenses: Not hard at all  Food Insecurity: No Food Insecurity (07/16/2022)   Hunger Vital Sign    Worried About Running Out of Food in the Last Year: Never true    Ran Out of Food in the Last Year: Never true  Transportation Needs: No Transportation Needs (07/16/2022)   PRAPARE - Hydrologist (Medical): No    Lack of Transportation (Non-Medical): No  Physical Activity: Inactive (07/16/2022)   Exercise Vital Sign    Days of Exercise per Week: 0 days    Minutes of Exercise per Session: 0 min  Stress: No Stress Concern Present (07/16/2022)   [place]    Feeling of Stress : Not at all  Social Connections: Moderately Integrated (07/16/2022)   Social Connection and Isolation Panel [NHANES]    Frequency of Communication with Friends and Family: More than three times a week    Frequency of Social Gatherings with Friends and Family: Once a week    Attends Religious Services: More than 4 times per year    Active Member of [employer] or Organizations: Yes    Attends Archivist Meetings: More than 4 times per year    Marital Status: Widowed    Tobacco Counseling Counseling given: Not Answered   Clinical Intake:  Pre-visit preparation completed: Yes  Pain : No/denies pain Pain Score: 0-No pain      BMI - recorded: 27.89 Nutritional Status: BMI 25 -29 Overweight Nutritional Risks: None Diabetes: No  How often do you need to have someone help you when you read instructions, pamphlets, or other written materials from your doctor or pharmacy?: 1 - Never What is the last grade level you completed in school?: HSG  Diabetic? no  Interpreter Needed?: No  Information entered by :: Lisette Abu, LPN.   Activities of Daily Living    07/16/2022    9:12 AM  In your present state of health, do you have any difficulty performing the following activities:  Hearing? 0  Vision? 0  Difficulty concentrating or making decisions? 0  Walking or climbing stairs? 0  Dressing or bathing? 0  Doing errands, shopping? 0  Preparing Food and eating ? N  Using the Toilet? N  In the past six months, have you accidently leaked urine? N  Do you have problems with loss of bowel control? N  Managing your Medications? N  Managing your Finances? N  Housekeeping or managing your Housekeeping? N    Patient Care Team: Hoyt Koch, MD as PCP - General (Internal Medicine) Werner Lean, MD as PCP - Cardiology (Cardiology) Katy Apo, MD as Consulting Physician (Ophthalmology) Szabat, Darnelle Maffucci, [facility] (Inactive) (Pharmacist)  Indicate any recent Medical Services you may have received from other than [facility] providers in the past year (date may be approximate).     Assessment:   This is a routine wellness examination for [name].  Hearing/Vision screen Hearing Screening - Comments:: Denies hearing difficulties   Vision Screening - Comments:: Wears rx glasses - up to date with routine eye exams with Katy Apo, MD.   Dietary issues and exercise activities discussed: Current Exercise Habits: The patient does not participate in regular exercise at present, Exercise limited by: orthopedic condition(s)   Goals Addressed             This Visit's Progress    My healthcare goal  for 2024 is to maintain my current  health status by continuing to eat healthy, stay physically active and socially active.        Depression Screen    07/16/2022    9:10 AM 06/12/2022   12:01 PM 04/28/2022    2:41 PM 06/18/2021    2:37 PM 06/18/2021    2:31 PM 05/10/2020   10:29 AM 03/30/2019   10:37 AM  PHQ 2/9 Scores  PHQ - 2 Score 0 0 0 0 0 0 0    Fall Risk    07/16/2022    9:10 AM 06/12/2022   12:01 PM 04/28/2022    2:41 PM 06/18/2021    2:41 PM 05/10/2020   11:14 AM  [place] in the past year? 0 0 0 0 0  Number falls in past yr: 0 0 0 0 0  Injury with Fall? 0 0 0 0 0  Risk for fall due to : No Fall Risks No Fall Risks No Fall Risks No Fall Risks No Fall Risks  Follow up Falls prevention discussed Falls evaluation completed Falls evaluation completed Falls prevention discussed Falls evaluation completed    FALL RISK PREVENTION PERTAINING TO THE HOME:  Any stairs in or around the home? Yes  If so, are there any without handrails? No  Home free of loose throw rugs in walkways, pet beds, electrical cords, etc? Yes  Adequate lighting in your home to reduce risk of falls? Yes   ASSISTIVE DEVICES UTILIZED TO PREVENT FALLS:  Life alert? No  Use of a cane, walker or w/c? No  Grab bars in the bathroom? No  Shower chair or bench in shower? No  Elevated toilet seat or a handicapped toilet? No   TIMED UP AND GO:  Was the test performed? No . Phone Visit  Cognitive Function:        07/16/2022    9:12 AM 05/10/2020   11:15 AM  6CIT Screen  What Year? 0 points 0 points  What month? 0 points 0 points  What time? 0 points 0 points  Count back from 20 0 points 0 points  Months in reverse 0 points 0 points  Repeat phrase 0 points 0 points  Total Score 0 points 0 points    Immunizations Immunization History  Administered Date(s) Administered   COVID-19, mRNA, vaccine(Comirnaty)12 years and older 04/23/2022   Fluad Quad(high Dose 65+) 03/30/2019,  03/29/2020, 03/27/2021, 03/20/2022   Influenza Split 04/23/2011   Influenza Whole 05/02/2012   Influenza, High Dose Seasonal PF 02/26/2016, 05/14/2017, 04/12/2018, 03/27/2021   Influenza,inj,Quad PF,6+ Mos 05/10/2013, 06/08/2014, 05/03/2015   PFIZER Comirnaty(Gray Top)Covid-19 Tri-Sucrose Vaccine 11/13/2020   PFIZER(Purple Top)SARS-COV-2 Vaccination 08/11/2019, 09/02/2019, 04/27/2020   Pfizer Covid-19 Vaccine Bivalent Booster 41yr & up 05/13/2021   Pneumococcal Conjugate-13 05/03/2015   Pneumococcal Polysaccharide-23 11/19/2010   Tdap 11/19/2010, 10/23/2021    TDAP status: Up to date  Flu Vaccine status: Up to date  Pneumococcal vaccine status: Up to date  Covid-19 vaccine status: Completed vaccines  Qualifies for Shingles Vaccine? Yes   Zostavax completed No   Shingrix Completed?: No.    Education has been provided regarding the importance of this vaccine. Patient has been advised to call insurance company to determine out of pocket expense if they have not yet received this vaccine. Advised may also receive vaccine at local pharmacy or Health Dept. Verbalized acceptance and understanding.  Screening Tests Health Maintenance  Topic Date Due   Zoster Vaccines- Shingrix (1 of 2) 07/29/2022 (Originally 10/05/1995)   Medicare  Annual Wellness (AWV)  07/17/2023   COLONOSCOPY (Pts 45-37yr Insurance coverage will need to be confirmed)  04/22/2026   DTaP/Tdap/Td (3 - Td or Tdap) 10/24/2031   Pneumonia Vaccine 77 Years old  Completed   INFLUENZA VACCINE  Completed   DEXA SCAN  Completed   COVID-19 Vaccine  Completed   Hepatitis C Screening  Completed   HPV VACCINES  Aged Out    Health Maintenance  There are no preventive care reminders to display for this patient.   Colorectal cancer screening: Type of screening: Colonoscopy. Completed 04/02/2021. Repeat every 5 years  Mammogram status: Completed 04/03/2022. Repeat every year  Bone Density status: Completed 07/23/2015. Results  reflect: Bone density results: OSTEOPENIA. Repeat every 2-3 years.  Lung Cancer Screening: (Low Dose CT Chest recommended if Age 77-80years, 30 pack-year currently smoking OR have quit w/in 15years.) does not qualify.   Lung Cancer Screening Referral: no  Additional Screening:  Hepatitis C Screening: does not qualify; Completed no  Vision Screening: Recommended annual ophthalmology exams for early detection of glaucoma and other disorders of the eye. Is the patient up to date with their annual eye exam?  Yes  Who is the provider or what is the name of the office in which the patient attends annual eye exams? GKaty Apo MD. If pt is not established with a provider, would they like to be referred to a provider to establish care? No .   Dental Screening: Recommended annual dental exams for proper oral hygiene  Community Resource Referral / Chronic Care Management: CRR required this visit?  No   CCM required this visit?  No      Plan:     I have personally reviewed and noted the following in the patient's chart:   Medical and social history Use of alcohol, tobacco or illicit drugs  Current medications and supplements including opioid prescriptions. Patient is not currently taking opioid prescriptions. Functional ability and status Nutritional status Physical activity Advanced directives List of other physicians Hospitalizations, surgeries, and ER visits in previous 12 months Vitals Screenings to include cognitive, depression, and falls Referrals and appointments  In addition, I have reviewed and discussed with patient certain preventive protocols, quality metrics, and best practice recommendations. A written personalized care plan for preventive services as well as general preventive health recommendations were provided to patient.     SSheral Flow LPN   17/35/3299  Nurse Notes: N/A

## 2022-07-17 ENCOUNTER — Encounter: Payer: Self-pay | Admitting: Internal Medicine

## 2022-07-17 ENCOUNTER — Telehealth (INDEPENDENT_AMBULATORY_CARE_PROVIDER_SITE_OTHER): Payer: Medicare HMO | Admitting: Internal Medicine

## 2022-07-17 DIAGNOSIS — U071 COVID-19: Secondary | ICD-10-CM | POA: Diagnosis not present

## 2022-07-17 MED ORDER — PROMETHAZINE-DM 6.25-15 MG/5ML PO SYRP: 5.0000 mL | ORAL_SOLUTION | Freq: Four times a day (QID) | ORAL | 0 refills | Status: DC | PRN

## 2022-07-17 NOTE — Progress Notes (Signed)
Virtual Visit via Video Note  I connected with [name] on 07/17/22 at  1:40 PM EST by a video enabled telemedicine application and verified that I am speaking with the correct person using two identifiers.  The patient and the provider were at separate locations throughout the entire encounter. Patient location: home, Provider location: work   I discussed the limitations of evaluation and management by telemedicine and the availability of in person appointments. The patient expressed understanding and agreed to proceed. The patient and the provider were the only parties present for the visit unless noted in HPI below.  History of Present Illness: The patient is a 77 y.o. female with visit for covid-19 positive and symptoms. Was prescribed molnupiravir and unable to tolerate.  Observations/Objective: Appearance: normal, breathing appears normal minimal coughing during visit, casual grooming  Assessment and Plan: See problem oriented charting  Follow Up Instructions: rx promethazine/dm cough syrup reassurance given, reaffirmed CDC guidelines on quarantine  I discussed the assessment and treatment plan with the patient. The patient was provided an opportunity to ask questions and all were answered. The patient agreed with the plan and demonstrated an understanding of the instructions.   The patient was advised to call back or seek an in-person evaluation if the symptoms worsen or if the condition fails to improve as anticipated.  Hoyt Koch, MD

## 2022-07-17 NOTE — Assessment & Plan Note (Signed)
Rx promethazine/dm. She is still within 5 day window for quarantine and reminded about timeline. She did not tolerate antiviral.

## 2022-07-20 ENCOUNTER — Other Ambulatory Visit: Payer: Self-pay | Admitting: Internal Medicine

## 2022-07-20 DIAGNOSIS — Z1231 Encounter for screening mammogram for malignant neoplasm of breast: Secondary | ICD-10-CM

## 2022-08-06 DIAGNOSIS — Z961 Presence of intraocular lens: Secondary | ICD-10-CM | POA: Diagnosis not present

## 2022-08-06 DIAGNOSIS — H26493 Other secondary cataract, bilateral: Secondary | ICD-10-CM | POA: Diagnosis not present

## 2022-08-24 ENCOUNTER — Ambulatory Visit (INDEPENDENT_AMBULATORY_CARE_PROVIDER_SITE_OTHER): Payer: Medicare HMO | Admitting: Podiatry

## 2022-08-24 ENCOUNTER — Encounter: Payer: Self-pay | Admitting: Podiatry

## 2022-08-24 DIAGNOSIS — L603 Nail dystrophy: Secondary | ICD-10-CM | POA: Diagnosis not present

## 2022-08-24 NOTE — Progress Notes (Signed)
Chief Complaint  Patient presents with   Nail Problem    Routine foot care , Bilateral great toe thickness     HPI: Patient is 77 y.o. female who presents today for thickness and discoloration of the left hallux nail, left second toenail and right hallux nail.  Here today to have them trimmed and discuss options for particularly the left great toe.   Patient Active Problem List   Diagnosis Date Noted   COVID-19 07/17/2022   Coronary artery disease involving native coronary artery of native heart without angina pectoris 07/10/2022   Tinnitus, bilateral 04/28/2022   Statin myopathy 03/11/2022   Arthritis of left acromioclavicular joint 04/04/2019   Rotator cuff arthropathy of left shoulder 03/24/2017   Acute sinusitis 09/30/2016   Essential hypertension 10/03/2014   Impingement syndrome of left shoulder region 10/03/2014   Impaired fasting glucose 02/17/2012   GERD 03/17/2009   Mixed hyperlipidemia 02/11/2009    Current Outpatient Medications on File Prior to Visit  Medication Sig Dispense Refill   acetaminophen (TYLENOL) 500 MG tablet Take 500-750 mg by mouth every 6 (six) hours as needed for pain.     alum & mag hydroxide-simeth (MAALOX/MYLANTA) 200-200-20 MG/5ML suspension Take 15 mLs by mouth as needed for indigestion or heartburn.     esomeprazole (NEXIUM) 40 MG capsule Take 1 capsule (40 mg total) by mouth daily. 90 capsule 3   estradiol (ESTRACE) 0.1 MG/GM vaginal cream Place 1 Applicatorful vaginally 3 (three) times a week. 42.5 g 3   famotidine (PEPCID) 20 MG tablet TAKE 1 TABLET BY MOUTH TWICE A DAY 60 tablet 1   fluticasone (FLONASE) 50 MCG/ACT nasal spray Place 2 sprays into both nostrils daily. (Patient taking differently: Place 2 sprays into both nostrils daily as needed.) 48 g 6   hydrochlorothiazide (HYDRODIURIL) 12.5 MG tablet TAKE 1 TABLET BY MOUTH EVERY DAY 90 tablet 3   Iron-Vitamins (S.S.S. TONIC PO) Take 1 tablet by mouth once a week.     Multiple Vitamin  (MULTIVITAMIN) tablet Take 1 tablet by mouth 3 (three) times a week.     nitroGLYCERIN (NITROSTAT) 0.4 MG SL tablet Place 1 tablet (0.4 mg total) under the tongue every 5 (five) minutes as needed for chest pain. 25 tablet 3   OVER THE COUNTER MEDICATION Place 1 suppository rectally daily as needed (for hemmorhoids). Fleet     Probiotic Product (PROBIOTIC-10 ULTIMATE) CAPS Take by mouth daily at 6 (six) AM.     promethazine-dextromethorphan (PROMETHAZINE-DM) 6.25-15 MG/5ML syrup Take 5 mLs by mouth 4 (four) times daily as needed. 118 mL 0   rosuvastatin (CRESTOR) 20 MG tablet TAKE 1 TABLET BY MOUTH THREE TIMES A WEEK. 36 tablet 3   VITAMIN D PO Take 1 tablet by mouth daily.     No current facility-administered medications on file prior to visit.    Allergies  Allergen Reactions   Zocor [Simvastatin]     12/2013 CK 528   Advil [Ibuprofen] Swelling    Tongue swells   Bran [Wheat Bran] Other (See Comments)    Very bad GI pain   Hydrocodone Nausea Only and Other (See Comments)     TACHYCARDIA   Hydrocodone-Acetaminophen Nausea Only    Other reaction(s): Increased Heart Rate (intolerance)   Metoprolol Tartrate Tinitus   Lipitor [Atorvastatin] Other (See Comments)    Bad dreams   Pravachol [Pravastatin Sodium] Other (See Comments)    Bad dreams    Review of Systems No fevers, chills, nausea, muscle aches,  no difficulty breathing, no calf pain, no chest pain or shortness of breath.   Physical Exam  GENERAL APPEARANCE: Alert, conversant. Appropriately groomed. No acute distress.   VASCULAR: Pedal pulses palpable 2/4 DP and 2/4 PT bilateral.  Capillary refill time is immediate to all digits,  Proximal to distal cooling is warm to warm.  Digital perfusion adequate.   NEUROLOGIC: sensation is intact to 5.07 monofilament at 5/5 sites bilateral.  Light touch is intact bilateral, vibratory sensation intact bilateral  MUSCULOSKELETAL: acceptable muscle strength, tone and stability bilateral.   No gross boney pedal deformities noted.  No pain, crepitus or limitation noted with foot and ankle range of motion bilateral.   DERMATOLOGIC: skin is warm, supple, and dry.  Color, texture, and turgor of skin within normal limits.  No open wounds are noted.  Digital nail are slightly thickened bilateral 1st and second left.  No significant subungual debris noted.  No pain with palpation noted.  Dystrophic nail vs microtrauma favored.     Assessment     ICD-10-CM   1. Nail dystrophy  L60.3         Plan -Mechanically debrided all nails 1-2 bilateral using sterile nail nipper and filed with dremel without incident as courtesy Discussed potential nail avulsion of the left great toe if she continues to have pain. Patient will consider. Discussed peri procedure course.  -Answered all patient questions -Patient to return as needed in the future for procedure vs rfc.  -Patient advised to call the office if any problems or questions arise in the meantime.

## 2022-08-25 DIAGNOSIS — H10411 Chronic giant papillary conjunctivitis, right eye: Secondary | ICD-10-CM | POA: Diagnosis not present

## 2022-08-25 DIAGNOSIS — H00011 Hordeolum externum right upper eyelid: Secondary | ICD-10-CM | POA: Diagnosis not present

## 2022-09-08 ENCOUNTER — Ambulatory Visit: Payer: Medicare HMO

## 2022-09-10 DIAGNOSIS — H903 Sensorineural hearing loss, bilateral: Secondary | ICD-10-CM | POA: Diagnosis not present

## 2022-09-10 DIAGNOSIS — H9313 Tinnitus, bilateral: Secondary | ICD-10-CM | POA: Diagnosis not present

## 2022-10-08 ENCOUNTER — Ambulatory Visit (INDEPENDENT_AMBULATORY_CARE_PROVIDER_SITE_OTHER): Payer: Medicare HMO | Admitting: Primary Care

## 2022-10-08 ENCOUNTER — Encounter: Payer: Self-pay | Admitting: Primary Care

## 2022-10-08 VITALS — BP 122/74 | HR 85 | Temp 98.1°F | Ht 60.0 in | Wt 140.0 lb

## 2022-10-08 DIAGNOSIS — H1031 Unspecified acute conjunctivitis, right eye: Secondary | ICD-10-CM | POA: Insufficient documentation

## 2022-10-08 DIAGNOSIS — J309 Allergic rhinitis, unspecified: Secondary | ICD-10-CM | POA: Diagnosis not present

## 2022-10-08 MED ORDER — FLUTICASONE PROPIONATE 50 MCG/ACT NA SUSP: 2.0000 | Freq: Every day | NASAL | 0 refills | Status: DC

## 2022-10-08 MED ORDER — POLYMYXIN B-TRIMETHOPRIM 10000-0.1 UNIT/ML-% OP SOLN: 1.0000 [drp] | Freq: Four times a day (QID) | OPHTHALMIC | 0 refills | Status: DC

## 2022-10-08 NOTE — Progress Notes (Signed)
Subjective:    Patient ID: [name], female    DOB: 16-Apr-1946, 77 y.o.   MRN: [id number]  Sore Throat  Associated symptoms include coughing. Pertinent negatives include no congestion.  Conjunctivitis  Associated symptoms include eye itching, cough, eye discharge and eye pain. Pertinent negatives include no congestion and no sore throat.    [name] is a very pleasant 77 y.o. female patient of Dr. Okey Dupre with a history of hypertension, CAD, sinusitis, Covid-19 infection who presents today to discuss eye irritation.  Symptom onset 6 days ago with sore throat which resolved about 4 days ago. Last night she noticed swelling to her right upper eyelid and woke up this morning with crusting, itching, and redness.   She continues to notice swelling and has noticed a puss colored discharge in her lower eye. She's been taking Tylenol.   Her daughter and eldest son was diagnosed with pink eye 3 days ago. She does not wear contacts.    Review of Systems  HENT:  Positive for postnasal drip. Negative for congestion and sore throat.   Eyes:  Positive for pain, discharge and itching.  Respiratory:  Positive for cough.          Past Medical History:  Diagnosis Date   Arthritis    KNEES   Blood transfusion without reported diagnosis    Cataract    CTS (carpal tunnel syndrome)    BILATERAL LEFT > RIGHT (OCC. HAND NUMBNESS)   Female cystocele    GERD (gastroesophageal reflux disease)    H/O hiatal hernia    History of cervical dysplasia    CIN II   History of peritonitis    2006--  S/P PERFORATED BOWEL INJURY DURING SURGERY   Hyperlipidemia    Hypertension     Social History   Socioeconomic History   Marital status: Married    Spouse name: Not on file   Number of children: Not on file   Years of education: Not on file   Highest education level: Not on file  Occupational History   Not on file  Tobacco Use   Smoking status: Never   Smokeless tobacco: Never   Vaping Use   Vaping Use: Never used  Substance and Sexual Activity   Alcohol use: No    Alcohol/week: 0.0 standard drinks of alcohol    Comment: no   Drug use: No   Sexual activity: Not Currently    Birth control/protection: Surgical  Other Topics Concern   Not on file  Social History Narrative   Married '68. 2 son - '69, '70; 1 son - '71: 2 grandchildren. 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   Social Determinants of Health   Financial Resource Strain: Low Risk  (07/16/2022)   Overall Financial Resource Strain (CARDIA)    Difficulty of Paying Living Expenses: Not hard at all  Food Insecurity: No Food Insecurity (07/16/2022)   Hunger Vital Sign    Worried About Running Out of Food in the Last Year: Never true    Ran Out of Food in the Last Year: Never true  Transportation Needs: No Transportation Needs (07/16/2022)   PRAPARE - Administrator, Civil Service (Medical): No    Lack of Transportation (Non-Medical): No  Physical Activity: Inactive (07/16/2022)   Exercise Vital Sign    Days of Exercise per Week: 0 days    Minutes of Exercise per Session: 0 min  Stress: No Stress Concern Present (07/16/2022)   [employer]  of Occupational Health - Occupational Stress Questionnaire    Feeling of Stress : Not at all  Social Connections: Moderately Integrated (07/16/2022)   Social Connection and Isolation Panel [NHANES]    Frequency of Communication with Friends and Family: More than three times a week    Frequency of Social Gatherings with Friends and Family: Once a week    Attends Religious Services: More than 4 times per year    Active Member of [employer] Financial[employer]ubs or Organizations: Yes    Attends BankerClub or Organization Meetings: More than 4 times per year    Marital Status: Widowed  Intimate Partner Violence: Not At Risk (07/16/2022)   Humiliation, Afraid, Rape, and Kick questionnaire    Fear of Current or Ex-Partner: No    Emotionally Abused: No    Physically Abused: No     Sexually Abused: No    Past Surgical History:  Procedure Laterality Date   ABDOMINAL HYSTERECTOMY  1991   W/ BILATERAL SALPINGOOPHORECTOMY   BREAST BIOPSY     LAPAROSCOPIC CHOLECYSTECTOMY  2006   W/ PERFERATED BOWEL (INJURY)   NASAL SEPTUM SURGERY  70's   REPAIR DEVIATED SEPTUM   sepsis after gallbladder surgery     TUBAL LIGATION  1980'S   VAGINAL PROLAPSE REPAIR N/A 08/15/2012   Procedure: VAGINAL VAULT SUSPENSION;  Surgeon: Kathi LudwigSigmund I Tannenbaum, MD;  [facility];  Service: Urology;  Laterality: N/A;  ANTERIOR VAULT REPAIR WITH XENFORE    Family History  Problem Relation Age of Onset   Coronary artery disease Mother    Other Mother        cva   Hypertension Mother    Heart disease Mother    Stroke Father    Breast cancer Sister    Pancreatic cancer Brother    Aneurysm Brother        abdominal    Breast cancer Sister    Diabetes Other    Coronary artery disease Other    Hypertension Other    Stroke Other    Cancer Other        breast   Prostate cancer Brother    Colon cancer Neg Hx    Esophageal cancer Neg Hx    Stomach cancer Neg Hx    Liver disease Neg Hx     Allergies  Allergen Reactions   Zocor [Simvastatin]     12/2013 CK 528   Advil [Ibuprofen] Swelling    Tongue swells   Bran [Wheat] Other (See Comments)    Very bad GI pain   Hydrocodone Nausea Only and Other (See Comments)     TACHYCARDIA   Hydrocodone-Acetaminophen Nausea Only    Other reaction(s): Increased Heart Rate (intolerance)   Metoprolol Tartrate Tinitus   Lipitor [Atorvastatin] Other (See Comments)    Bad dreams   Pravachol [Pravastatin Sodium] Other (See Comments)    Bad dreams    Current Outpatient Medications on File Prior to Visit  Medication Sig Dispense Refill   acetaminophen (TYLENOL) 500 MG tablet Take 500-750 mg by mouth every 6 (six) hours as needed for pain.     alum & mag hydroxide-simeth (MAALOX/MYLANTA) 200-200-20 MG/5ML suspension Take 15 mLs  by mouth as needed for indigestion or heartburn.     esomeprazole (NEXIUM) 40 MG capsule Take 1 capsule (40 mg total) by mouth daily. 90 capsule 3   estradiol (ESTRACE) 0.1 MG/GM vaginal cream Place 1 Applicatorful vaginally 3 (three) times a week. 42.5 g 3   famotidine (PEPCID) 20  MG tablet TAKE 1 TABLET BY MOUTH TWICE A DAY 60 tablet 1   fluticasone (FLONASE) 50 MCG/ACT nasal spray Place 2 sprays into both nostrils daily. (Patient taking differently: Place 2 sprays into both nostrils daily as needed.) 48 g 6   hydrochlorothiazide (HYDRODIURIL) 12.5 MG tablet TAKE 1 TABLET BY MOUTH EVERY DAY 90 tablet 3   Iron-Vitamins (S.S.S. TONIC PO) Take 1 tablet by mouth once a week.     Multiple Vitamin (MULTIVITAMIN) tablet Take 1 tablet by mouth 3 (three) times a week.     nitroGLYCERIN (NITROSTAT) 0.4 MG SL tablet Place 1 tablet (0.4 mg total) under the tongue every 5 (five) minutes as needed for chest pain. 25 tablet 3   OVER THE COUNTER MEDICATION Place 1 suppository rectally daily as needed (for hemmorhoids). Fleet     Probiotic Product (PROBIOTIC-10 ULTIMATE) CAPS Take by mouth daily at 6 (six) AM.     promethazine-dextromethorphan (PROMETHAZINE-DM) 6.25-15 MG/5ML syrup Take 5 mLs by mouth 4 (four) times daily as needed. 118 mL 0   rosuvastatin (CRESTOR) 20 MG tablet TAKE 1 TABLET BY MOUTH THREE TIMES A WEEK. 36 tablet 3   VITAMIN D PO Take 1 tablet by mouth daily.     No current facility-administered medications on file prior to visit.    BP 122/74   Pulse 85   Temp 98.1 F (36.7 C) (Temporal)   Ht 5' (1.524 m)   Wt 140 lb (63.5 kg)   SpO2 98%   BMI 27.34 kg/m  Objective:   Physical Exam Constitutional:      Appearance: She is not ill-appearing.  HENT:     Mouth/Throat:     Mouth: Mucous membranes are dry.     Pharynx: No posterior oropharyngeal erythema.  Eyes:     General:        Right eye: No discharge.        Left eye: No discharge.     Conjunctiva/sclera:     Right eye:  Right conjunctiva is injected. No exudate or hemorrhage.    Left eye: Left conjunctiva is not injected. No exudate or hemorrhage.    Comments: Moderately injected right conjunctiva with mild to moderate upper and lower eyelid swelling.   Pulmonary:     Effort: Pulmonary effort is normal.     Breath sounds: Normal breath sounds.           Assessment & Plan:  Acute conjunctivitis of right eye, unspecified acute conjunctivitis type Assessment & Plan: Could be viral, but given reports of purulent drainage this morning (and family members with diagnosed pink eye), will treat for bacterial involvement.   Start trimethoprim-polymixin eye drops q 6 hours x 5-7 days.  Discussed warm compresses, antihistamine for itching. Follow up as needed.  Orders: -     Polymyxin B-Trimethoprim; Place 1 drop into both eyes every 6 (six) hours. For 5-7 days.  Dispense: 10 mL; Refill: 0        Doreene Nest, NP

## 2022-10-08 NOTE — Addendum Note (Signed)
Addended by: Doreene Nest on: 10/08/2022 12:43 PM   Modules accepted: Orders

## 2022-10-08 NOTE — Patient Instructions (Signed)
Start the antibiotic eye drops for your eye infection. Instill 1 drop into both eyes four times daily for 5-7 days.  It was a pleasure meeting you!

## 2022-10-08 NOTE — Assessment & Plan Note (Signed)
Could be viral, but given reports of purulent drainage this morning (and family members with diagnosed pink eye), will treat for bacterial involvement.   Start trimethoprim-polymixin eye drops q 6 hours x 5-7 days.  Discussed warm compresses, antihistamine for itching. Follow up as needed.

## 2022-10-26 ENCOUNTER — Ambulatory Visit
Admission: RE | Admit: 2022-10-26 | Discharge: 2022-10-26 | Disposition: A | Discharge status: Home / Self Care | Payer: Medicare HMO | Source: Ambulatory Visit | Attending: Internal Medicine | Admitting: Internal Medicine

## 2022-10-26 DIAGNOSIS — Z1231 Encounter for screening mammogram for malignant neoplasm of breast: Secondary | ICD-10-CM | POA: Diagnosis not present

## 2022-10-28 ENCOUNTER — Encounter: Payer: Self-pay | Admitting: Internal Medicine

## 2022-10-28 LAB — HM MAMMOGRAPHY

## 2022-10-30 ENCOUNTER — Other Ambulatory Visit: Payer: Self-pay | Admitting: Primary Care

## 2022-10-30 DIAGNOSIS — J309 Allergic rhinitis, unspecified: Secondary | ICD-10-CM

## 2022-10-30 NOTE — Telephone Encounter (Signed)
Looks like patient has new pcp. Ok to decline script.

## 2022-11-09 ENCOUNTER — Other Ambulatory Visit: Payer: Self-pay | Admitting: *Deleted

## 2022-11-09 DIAGNOSIS — Z01419 Encounter for gynecological examination (general) (routine) without abnormal findings: Secondary | ICD-10-CM

## 2022-11-09 MED ORDER — ESTRADIOL 0.1 MG/GM VA CREA: 1.0000 | TOPICAL_CREAM | VAGINAL | 0 refills | Status: AC

## 2022-12-24 ENCOUNTER — Ambulatory Visit (INDEPENDENT_AMBULATORY_CARE_PROVIDER_SITE_OTHER): Payer: Medicare HMO

## 2022-12-24 ENCOUNTER — Ambulatory Visit (INDEPENDENT_AMBULATORY_CARE_PROVIDER_SITE_OTHER): Payer: Medicare HMO | Admitting: Family Medicine

## 2022-12-24 VITALS — BP 122/76 | HR 75 | Temp 97.6°F | Ht 60.0 in | Wt 138.0 lb

## 2022-12-24 DIAGNOSIS — M25461 Effusion, right knee: Secondary | ICD-10-CM

## 2022-12-24 DIAGNOSIS — M25561 Pain in right knee: Secondary | ICD-10-CM

## 2022-12-24 DIAGNOSIS — M25361 Other instability, right knee: Secondary | ICD-10-CM | POA: Diagnosis not present

## 2022-12-24 DIAGNOSIS — M7989 Other specified soft tissue disorders: Secondary | ICD-10-CM | POA: Diagnosis not present

## 2022-12-24 NOTE — Progress Notes (Signed)
Subjective:     Patient ID: [name], female    DOB: Dec 06, 1945, 77 y.o.   MRN: [id number]  Chief Complaint  Patient presents with   Knee Pain    Right knee pain since Saturday, started bothering her during the day. Sore when bending, worst when puts pressure when walking    HPI  Discussed the use of AI scribe software for clinical note transcription with the patient, who gave verbal consent to proceed.  History of Present Illness         C/o right knee pain x 5 days. Feels like it might give away.  No known injury.  She was doing more walking last week prior to pain.   No hx of knee surgery. Recalls her knee cap going in and out of place as a child.   Using Aspercreme.      Health Maintenance Due  Topic Date Due   Zoster Vaccines- Shingrix (1 of 2) Never done    Past Medical History:  Diagnosis Date   Arthritis    KNEES   Blood transfusion without reported diagnosis    Cataract    CTS (carpal tunnel syndrome)    BILATERAL LEFT > RIGHT (OCC. HAND NUMBNESS)   Female cystocele    GERD (gastroesophageal reflux disease)    H/O hiatal hernia    History of cervical dysplasia    CIN II   History of peritonitis    2006--  S/P PERFORATED BOWEL INJURY DURING SURGERY   Hyperlipidemia    Hypertension     Past Surgical History:  Procedure Laterality Date   ABDOMINAL HYSTERECTOMY  1991   W/ BILATERAL SALPINGOOPHORECTOMY   BREAST BIOPSY Right 2014   LAPAROSCOPIC CHOLECYSTECTOMY  2006   W/ PERFERATED BOWEL (INJURY)   NASAL SEPTUM SURGERY  70's   REPAIR DEVIATED SEPTUM   sepsis after gallbladder surgery     TUBAL LIGATION  1980'S   VAGINAL PROLAPSE REPAIR N/A 08/15/2012   Procedure: VAGINAL VAULT SUSPENSION;  Surgeon: Kathi Ludwig, MD;  Location: [facility] [place];  Service: Urology;  Laterality: N/A;  ANTERIOR VAULT REPAIR WITH XENFORE    Family History  Problem Relation Age of Onset   Coronary artery disease Mother    Other Mother         cva   Hypertension Mother    Heart disease Mother    Stroke Father    Breast cancer Sister    Pancreatic cancer Brother    Aneurysm Brother        abdominal    Breast cancer Sister    Diabetes Other    Coronary artery disease Other    Hypertension Other    Stroke Other    Cancer Other        breast   Prostate cancer Brother    Colon cancer Neg Hx    Esophageal cancer Neg Hx    Stomach cancer Neg Hx    Liver disease Neg Hx     Social History   Socioeconomic History   Marital status: Married    Spouse name: Not on file   Number of children: Not on file   Years of education: Not on file   Highest education level: Not on file  Occupational History   Not on file  Tobacco Use   Smoking status: Never   Smokeless tobacco: Never  Vaping Use   Vaping Use: Never used  Substance and Sexual Activity   Alcohol use: No  Alcohol/week: 0.0 standard drinks of alcohol    Comment: no   Drug use: No   Sexual activity: Not Currently    Birth control/protection: Surgical  Other Topics Concern   Not on file  Social History Narrative   Married '68. 2 son - '69, '70; 1 son - '71: 2 grandchildren. 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   Social Determinants of Health   Financial Resource Strain: Low Risk  (07/16/2022)   Overall Financial Resource Strain (CARDIA)    Difficulty of Paying Living Expenses: Not hard at all  Food Insecurity: No Food Insecurity (07/16/2022)   Hunger Vital Sign    Worried About Running Out of Food in the Last Year: Never true    Ran Out of Food in the Last Year: Never true  Transportation Needs: No Transportation Needs (07/16/2022)   PRAPARE - Administrator, Civil Service (Medical): No    Lack of Transportation (Non-Medical): No  Physical Activity: Inactive (07/16/2022)   Exercise Vital Sign    Days of Exercise per Week: 0 days    Minutes of Exercise per Session: 0 min  Stress: No Stress Concern Present (07/16/2022)   [employer] of Occupational Health - Occupational Stress Questionnaire    Feeling of Stress : Not at all  Social Connections: Moderately Integrated (07/16/2022)   Social Connection and Isolation Panel [NHANES]    Frequency of Communication with Friends and Family: More than three times a week    Frequency of Social Gatherings with Friends and Family: Once a week    Attends Religious Services: More than 4 times per year    Active Member of [employer] or Organizations: Yes    Attends Banker Meetings: More than 4 times per year    Marital Status: Widowed  Intimate Partner Violence: Not At Risk (07/16/2022)   Humiliation, Afraid, Rape, and Kick questionnaire    Fear of Current or Ex-Partner: No    Emotionally Abused: No    Physically Abused: No    Sexually Abused: No    Outpatient Medications Prior to Visit  Medication Sig Dispense Refill   acetaminophen (TYLENOL) 500 MG tablet Take 500-750 mg by mouth every 6 (six) hours as needed for pain.     alum & mag hydroxide-simeth (MAALOX/MYLANTA) 200-200-20 MG/5ML suspension Take 15 mLs by mouth as needed for indigestion or heartburn.     esomeprazole (NEXIUM) 40 MG capsule Take 1 capsule (40 mg total) by mouth daily. 90 capsule 3   estradiol (ESTRACE) 0.1 MG/GM vaginal cream Place 1 Applicatorful vaginally 3 (three) times a week. 42.5 g 0   famotidine (PEPCID) 20 MG tablet TAKE 1 TABLET BY MOUTH TWICE A DAY 60 tablet 1   fluticasone (FLONASE) 50 MCG/ACT nasal spray SPRAY 2 SPRAYS INTO EACH NOSTRIL EVERY DAY 48 mL 1   hydrochlorothiazide (HYDRODIURIL) 12.5 MG tablet TAKE 1 TABLET BY MOUTH EVERY DAY 90 tablet 3   Iron-Vitamins (S.S.S. TONIC PO) Take 1 tablet by mouth once a week.     Multiple Vitamin (MULTIVITAMIN) tablet Take 1 tablet by mouth 3 (three) times a week.     nitroGLYCERIN (NITROSTAT) 0.4 MG SL tablet Place 1 tablet (0.4 mg total) under the tongue every 5 (five) minutes as needed for chest pain. 25 tablet 3   OVER THE COUNTER  MEDICATION Place 1 suppository rectally daily as needed (for hemmorhoids). Fleet     Probiotic Product (PROBIOTIC-10 ULTIMATE) CAPS Take by mouth daily at 6 (six) AM.  rosuvastatin (CRESTOR) 20 MG tablet TAKE 1 TABLET BY MOUTH THREE TIMES A WEEK. 36 tablet 3   VITAMIN D PO Take 1 tablet by mouth daily.     promethazine-dextromethorphan (PROMETHAZINE-DM) 6.25-15 MG/5ML syrup Take 5 mLs by mouth 4 (four) times daily as needed. (Patient not taking: Reported on 12/24/2022) 118 mL 0   trimethoprim-polymyxin b (POLYTRIM) ophthalmic solution Place 1 drop into both eyes every 6 (six) hours. For 5-7 days. (Patient not taking: Reported on 12/24/2022) 10 mL 0   No facility-administered medications prior to visit.    Allergies  Allergen Reactions   Zocor [Simvastatin]     12/2013 CK 528   Advil [Ibuprofen] Swelling    Tongue swells   Bran [Wheat] Other (See Comments)    Very bad GI pain   Hydrocodone Nausea Only and Other (See Comments)     TACHYCARDIA   Hydrocodone-Acetaminophen Nausea Only    Other reaction(s): Increased Heart Rate (intolerance)   Metoprolol Tartrate Tinitus   Lipitor [Atorvastatin] Other (See Comments)    Bad dreams   Pravachol [Pravastatin Sodium] Other (See Comments)    Bad dreams    Review of Systems  Constitutional:  Negative for chills and fever.  Respiratory:  Negative for shortness of breath.   Cardiovascular:  Negative for claudication and leg swelling.  Gastrointestinal:  Negative for nausea and vomiting.  Musculoskeletal:  Positive for joint pain. Negative for falls and myalgias.  Neurological:  Negative for dizziness and focal weakness.       Objective:    Physical Exam Constitutional:      General: She is not in acute distress.    Appearance: She is not ill-appearing.  Eyes:     Extraocular Movements: Extraocular movements intact.     Conjunctiva/sclera: Conjunctivae normal.  Cardiovascular:     Rate and Rhythm: Normal rate and regular rhythm.   Pulmonary:     Effort: Pulmonary effort is normal.     Breath sounds: Normal breath sounds.  Musculoskeletal:     Cervical back: Normal range of motion and neck supple.     Right knee: Swelling and crepitus present. No erythema. Normal range of motion. Tenderness present over the lateral joint line and PCL. Normal patellar mobility. Normal pulse.     Right lower leg: No edema.     Left lower leg: No edema.     Comments: No obvious laxity. Negative McMurray test  Skin:    General: Skin is warm and dry.     Findings: No bruising, erythema or rash.  Neurological:     General: No focal deficit present.     Mental Status: She is alert and oriented to person, place, and time.     Cranial Nerves: No cranial nerve deficit.     Sensory: No sensory deficit.     Motor: No weakness.     Coordination: Coordination normal.     Gait: Gait normal.  Psychiatric:        Mood and Affect: Mood normal.        Behavior: Behavior normal.        Thought Content: Thought content normal.      BP 122/76 (BP Location: Left Arm, Patient Position: Sitting, Cuff Size: Large)   Pulse 75   Temp 97.6 F (36.4 C) (Temporal)   Ht 5' (1.524 m)   Wt 138 lb (62.6 kg)   SpO2 98%   BMI 26.95 kg/m  Wt Readings from Last 3 Encounters:  12/24/22 138 lb (  62.6 kg)  10/08/22 140 lb (63.5 kg)  07/16/22 143 lb (64.9 kg)       Assessment & Plan:   Problem List Items Addressed This Visit   None Visit Diagnoses     Pain and swelling of right knee    -  Primary   Relevant Orders   DG Knee 1-2 Views Right (Completed)      No red flag symptoms.  Knee X ray ordered.  Discussed conservative management.  Follow up if not improving in 1-2 weeks or sooner if worsening.   I have discontinued [name] L. Coran's promethazine-dextromethorphan and trimethoprim-polymyxin b. I am also having her maintain her multivitamin, acetaminophen, OVER THE COUNTER MEDICATION, esomeprazole, alum & mag hydroxide-simeth, Iron-Vitamins  (S.S.S. TONIC PO), VITAMIN D PO, rosuvastatin, Probiotic-10 Ultimate, nitroGLYCERIN, famotidine, hydrochlorothiazide, fluticasone, and estradiol.  No orders of the defined types were placed in this encounter.

## 2022-12-24 NOTE — Patient Instructions (Signed)
Please go downstairs for an X ray before you leave.   Try using a topical pain medication such as Salon Pas with lidocaine or Blue-Emu   Ice your knee 2-3 times per day.   Elevate your leg when sitting.   We will be in touch with your results.

## 2023-01-11 ENCOUNTER — Ambulatory Visit (INDEPENDENT_AMBULATORY_CARE_PROVIDER_SITE_OTHER): Payer: Medicare HMO | Admitting: Family Medicine

## 2023-01-11 ENCOUNTER — Encounter: Payer: Self-pay | Admitting: Family Medicine

## 2023-01-11 VITALS — BP 122/66 | HR 74 | Temp 98.0°F | Resp 20 | Ht 60.0 in | Wt 134.0 lb

## 2023-01-11 DIAGNOSIS — K644 Residual hemorrhoidal skin tags: Secondary | ICD-10-CM

## 2023-01-11 DIAGNOSIS — M1711 Unilateral primary osteoarthritis, right knee: Secondary | ICD-10-CM

## 2023-01-11 DIAGNOSIS — K921 Melena: Secondary | ICD-10-CM | POA: Diagnosis not present

## 2023-01-11 MED ORDER — METHYLPREDNISOLONE ACETATE 40 MG/ML IJ SUSP
40.0000 mg | Freq: Once | INTRAMUSCULAR | Status: AC
  Administered 2023-01-11: 40 mg via INTRA_ARTICULAR

## 2023-01-11 NOTE — Progress Notes (Signed)
Assessment & Plan:  1. Primary osteoarthritis of right knee Knee injection given in office today.  Patient tolerated well. - methylPREDNISolone acetate (DEPO-MEDROL) injection 40 mg  2. Blood in stool - Fecal occult blood, imunochemical([facility])  3. External hemorrhoids Hemorrhoid cream on Tucks pads.   Follow up plan: Return if symptoms worsen or fail to improve.  Deliah Boston, MSN, APRN, FNP-C  Subjective:  HPI: [name] is a 77 y.o. female presenting on 01/11/2023 for Knee Pain (Right - on going - was seen on 6/27, using salonpas patches - helps some (xray shown arthritis) ) and Blood In Stools (Medium red color - normal formed stool, noticed about 3 times (does have HX of polyps and hemorrhoids) Dr Russella Dar is GI - last colon 10/22 )  Knee Pain: Patient presents with knee pain involving the  right knee. Onset of the symptoms was  2.5 weeks ago. Inciting event: none known. Current symptoms include giving out and pain located laterally . Pain is aggravated by walking.  Evaluation to date: plain films: abnormal showing three compartmental osteoarthritis, greatest in the lateral compartment . Treatment to date: ice, OTC analgesics which are not very effective, and Salonpas .  She is allergic to NSAIDs.  Blood in Stool: Patient presents for presents evaluation of blood in stool/ rectal bleeding that she first noticed one month ago. Patient has associated symptoms of visible blood: maroon blood mixed in with stool. The patient denies abdominal pain and alternating loose stools and constipation.  The patient has a known history of: colon polyps and external hemorroids. The patient has had 3 episodes of rectal bleeding.  There is not a history of rectal injury.     ROS: Negative unless specifically indicated above in HPI.   Relevant past medical history reviewed and updated as indicated.   Allergies and medications reviewed and updated.   Current Outpatient Medications:     acetaminophen (TYLENOL) 500 MG tablet, Take 500-750 mg by mouth every 6 (six) hours as needed for pain., Disp: , Rfl:    alum & mag hydroxide-simeth (MAALOX/MYLANTA) 200-200-20 MG/5ML suspension, Take 15 mLs by mouth as needed for indigestion or heartburn., Disp: , Rfl:    esomeprazole (NEXIUM) 40 MG capsule, Take 1 capsule (40 mg total) by mouth daily., Disp: 90 capsule, Rfl: 3   estradiol (ESTRACE) 0.1 MG/GM vaginal cream, Place 1 Applicatorful vaginally 3 (three) times a week., Disp: 42.5 g, Rfl: 0   fluticasone (FLONASE) 50 MCG/ACT nasal spray, SPRAY 2 SPRAYS INTO EACH NOSTRIL EVERY DAY, Disp: 48 mL, Rfl: 1   hydrochlorothiazide (HYDRODIURIL) 12.5 MG tablet, TAKE 1 TABLET BY MOUTH EVERY DAY, Disp: 90 tablet, Rfl: 3   Multiple Vitamin (MULTIVITAMIN) tablet, Take 1 tablet by mouth 3 (three) times a week., Disp: , Rfl:    Probiotic Product (PROBIOTIC-10 ULTIMATE) CAPS, Take by mouth daily at 6 (six) AM., Disp: , Rfl:    rosuvastatin (CRESTOR) 20 MG tablet, TAKE 1 TABLET BY MOUTH THREE TIMES A WEEK., Disp: 36 tablet, Rfl: 3   VITAMIN D PO, Take 1 tablet by mouth daily., Disp: , Rfl:    famotidine (PEPCID) 20 MG tablet, TAKE 1 TABLET BY MOUTH TWICE A DAY (Patient not taking: Reported on 01/11/2023), Disp: 60 tablet, Rfl: 1   Iron-Vitamins (S.S.S. TONIC PO), Take 1 tablet by mouth once a week. (Patient not taking: Reported on 01/11/2023), Disp: , Rfl:    nitroGLYCERIN (NITROSTAT) 0.4 MG SL tablet, Place 1 tablet (0.4 mg total) under the  tongue every 5 (five) minutes as needed for chest pain. (Patient not taking: Reported on 01/11/2023), Disp: 25 tablet, Rfl: 3  Allergies  Allergen Reactions   Zocor [Simvastatin]     12/2013 CK 528   Advil [Ibuprofen] Swelling    Tongue swells   Bran [Wheat] Other (See Comments)    Very bad GI pain   Hydrocodone Nausea Only and Other (See Comments)     TACHYCARDIA   Hydrocodone-Acetaminophen Nausea Only    Other reaction(s): Increased Heart Rate (intolerance)    Metoprolol Tartrate Tinitus   Lipitor [Atorvastatin] Other (See Comments)    Bad dreams   Pravachol [Pravastatin Sodium] Other (See Comments)    Bad dreams    Objective:   BP 122/66   Pulse 74   Temp 98 F (36.7 C)   Resp 20   Ht 5' (1.524 m)   Wt 134 lb (60.8 kg)   BMI 26.17 kg/m    Physical Exam Vitals reviewed.  Constitutional:      General: She is not in acute distress.    Appearance: Normal appearance. She is not ill-appearing, toxic-appearing or diaphoretic.  HENT:     Head: Normocephalic and atraumatic.  Eyes:     General: No scleral icterus.       Right eye: No discharge.        Left eye: No discharge.     Conjunctiva/sclera: Conjunctivae normal.  Cardiovascular:     Rate and Rhythm: Normal rate.  Pulmonary:     Effort: Pulmonary effort is normal. No respiratory distress.  Musculoskeletal:        General: Normal range of motion.     Cervical back: Normal range of motion.     Right knee: No swelling, erythema or ecchymosis. Tenderness present.  Skin:    General: Skin is warm and dry.     Capillary Refill: Capillary refill takes less than 2 seconds.  Neurological:     General: No focal deficit present.     Mental Status: She is alert and oriented to person, place, and time. Mental status is at baseline.  Psychiatric:        Mood and Affect: Mood normal.        Behavior: Behavior normal.        Thought Content: Thought content normal.        Judgment: Judgment normal.     Joint Injection/Arthrocentesis  Date/Time: 01/11/2023 3:18 PM  Performed by: Gwenlyn Fudge, FNP Authorized by: Gwenlyn Fudge, FNP  Indications: pain  Body area: knee Joint: right knee Local anesthesia used: yes  Anesthesia: Local anesthesia used: yes Local Anesthetic: topical anesthetic  Sedation: Patient sedated: no  Preparation: Patient was prepped and draped in the usual sterile fashion. Needle size: 20 G Ultrasound guidance: no Approach:  medial Methylprednisolone amount: 40 mg Lidocaine 2% amount: 3 mL Patient tolerance: patient tolerated the procedure well with no immediate complications Comments: Written consent obtained to be scanned into patient's chart.

## 2023-01-11 NOTE — Patient Instructions (Signed)
Hemorrhoid cream on Tucks pads.

## 2023-01-19 ENCOUNTER — Ambulatory Visit: Payer: Medicare HMO

## 2023-01-20 ENCOUNTER — Other Ambulatory Visit (INDEPENDENT_AMBULATORY_CARE_PROVIDER_SITE_OTHER): Payer: Medicare HMO

## 2023-01-20 ENCOUNTER — Other Ambulatory Visit: Payer: Self-pay | Admitting: Internal Medicine

## 2023-01-20 DIAGNOSIS — K921 Melena: Secondary | ICD-10-CM

## 2023-01-20 LAB — HEMOCCULT SLIDES (X 3 CARDS)
Fecal Occult Blood: NEGATIVE
OCCULT 1: NEGATIVE
OCCULT 2: NEGATIVE
OCCULT 3: NEGATIVE
OCCULT 4: NEGATIVE
OCCULT 5: NEGATIVE

## 2023-01-31 ENCOUNTER — Other Ambulatory Visit: Payer: Self-pay | Admitting: Internal Medicine

## 2023-02-22 ENCOUNTER — Ambulatory Visit (INDEPENDENT_AMBULATORY_CARE_PROVIDER_SITE_OTHER): Payer: Medicare HMO | Admitting: Internal Medicine

## 2023-02-22 ENCOUNTER — Encounter: Payer: Self-pay | Admitting: Internal Medicine

## 2023-02-22 VITALS — BP 120/80 | HR 70 | Temp 98.7°F | Ht 60.0 in | Wt 134.0 lb

## 2023-02-22 DIAGNOSIS — E782 Mixed hyperlipidemia: Secondary | ICD-10-CM

## 2023-02-22 DIAGNOSIS — I10 Essential (primary) hypertension: Secondary | ICD-10-CM

## 2023-02-22 DIAGNOSIS — J309 Allergic rhinitis, unspecified: Secondary | ICD-10-CM | POA: Diagnosis not present

## 2023-02-22 DIAGNOSIS — Z0000 Encounter for general adult medical examination without abnormal findings: Secondary | ICD-10-CM | POA: Diagnosis not present

## 2023-02-22 DIAGNOSIS — K219 Gastro-esophageal reflux disease without esophagitis: Secondary | ICD-10-CM | POA: Diagnosis not present

## 2023-02-22 DIAGNOSIS — I2510 Atherosclerotic heart disease of native coronary artery without angina pectoris: Secondary | ICD-10-CM | POA: Diagnosis not present

## 2023-02-22 DIAGNOSIS — R7301 Impaired fasting glucose: Secondary | ICD-10-CM

## 2023-02-22 LAB — HEMOGLOBIN A1C: Hgb A1c MFr Bld: 6.1 % (ref 4.6–6.5)

## 2023-02-22 LAB — COMPREHENSIVE METABOLIC PANEL
ALT: 25 U/L (ref 0–35)
AST: 27 U/L (ref 0–37)
Albumin: 4.2 g/dL (ref 3.5–5.2)
Alkaline Phosphatase: 63 U/L (ref 39–117)
BUN: 14 mg/dL (ref 6–23)
CO2: 33 mEq/L — ABNORMAL HIGH (ref 19–32)
Calcium: 9.9 mg/dL (ref 8.4–10.5)
Chloride: 100 mEq/L (ref 96–112)
Creatinine, Ser: 0.82 mg/dL (ref 0.40–1.20)
GFR: 69.01 mL/min (ref >60.00)
Glucose, Bld: 91 mg/dL (ref 70–99)
Potassium: 3.3 mEq/L — ABNORMAL LOW (ref 3.5–5.1)
Sodium: 141 mEq/L (ref 135–145)
Total Bilirubin: 0.9 mg/dL (ref 0.2–1.2)
Total Protein: 7.1 g/dL (ref 6.0–8.3)

## 2023-02-22 LAB — CBC
HCT: 41.9 % (ref 36.0–46.0)
Hemoglobin: 13.6 g/dL (ref 12.0–15.0)
MCHC: 32.4 g/dL (ref 30.0–36.0)
MCV: 91.4 fl (ref 78.0–100.0)
Platelets: 288 10*3/uL (ref 150.0–400.0)
RBC: 4.59 Mil/uL (ref 3.87–5.11)
RDW: 14 % (ref 11.5–15.5)
WBC: 6 10*3/uL (ref 4.0–10.5)

## 2023-02-22 LAB — LIPID PANEL
Cholesterol: 195 mg/dL (ref 0–200)
HDL: 61.7 mg/dL (ref >39.00)
LDL Cholesterol: 110 mg/dL — ABNORMAL HIGH (ref 0–99)
NonHDL: 133.23
Total CHOL/HDL Ratio: 3
Triglycerides: 115 mg/dL (ref 0.0–149.0)
VLDL: 23 mg/dL (ref 0.0–40.0)

## 2023-02-22 MED ORDER — FAMOTIDINE 20 MG PO TABS: 20.0000 mg | ORAL_TABLET | Freq: Two times a day (BID) | ORAL | 3 refills | Status: DC

## 2023-02-22 MED ORDER — FLUTICASONE PROPIONATE 50 MCG/ACT NA SUSP: 2.0000 | Freq: Every day | NASAL | 3 refills | Status: AC

## 2023-02-22 MED ORDER — ROSUVASTATIN CALCIUM 20 MG PO TABS: ORAL_TABLET | ORAL | 3 refills | Status: DC

## 2023-02-22 MED ORDER — ESOMEPRAZOLE MAGNESIUM 40 MG PO CPDR: 40.0000 mg | DELAYED_RELEASE_CAPSULE | Freq: Every day | ORAL | 3 refills | Status: DC

## 2023-02-22 MED ORDER — HYDROCHLOROTHIAZIDE 12.5 MG PO TABS: 12.5000 mg | ORAL_TABLET | Freq: Every day | ORAL | 3 refills | Status: DC

## 2023-02-22 NOTE — Assessment & Plan Note (Signed)
Checking HgA1c and adjust as needed.  

## 2023-02-22 NOTE — Assessment & Plan Note (Signed)
Taking crestor 20 mg 3 times a week. Checking lipid panel and adjust as needed.

## 2023-02-22 NOTE — Assessment & Plan Note (Signed)
BP at goal on hydrochlorothiazide 12.5 mg daily and checking CMP and CBC and adjust as needed.

## 2023-02-22 NOTE — Assessment & Plan Note (Signed)
Taking crestor 20 mg 3 times a week. Checking lipid panel and adjust as needed. No chest pains.

## 2023-02-22 NOTE — Assessment & Plan Note (Signed)
Flu shot yearly. Pneumonia complete. Shingrix due at pharmacy. Tetanus due at pharmacy. Colonoscopy aged out prior to recall. Mammogram due 2025, pap smear aged out and dexa complete. Counseled about sun safety and mole surveillance. Counseled about the dangers of distracted driving. Given 10 year screening recommendations.

## 2023-02-22 NOTE — Progress Notes (Signed)
   Subjective:   Patient ID: [name], female    DOB: 28-Oct-1945, 77 y.o.   MRN: [id number]  HPI The patient is here for physical.  PMH, [facility], social history reviewed and updated  Review of Systems  Constitutional: Negative.   HENT: Negative.    Eyes: Negative.   Respiratory:  Negative for cough, chest tightness and shortness of breath.   Cardiovascular:  Negative for chest pain, palpitations and leg swelling.  Gastrointestinal:  Negative for abdominal distention, abdominal pain, constipation, diarrhea, nausea and vomiting.  Musculoskeletal: Negative.   Skin: Negative.   Neurological: Negative.   Psychiatric/Behavioral: Negative.      Objective:  Physical Exam Constitutional:      Appearance: She is well-developed.  HENT:     Head: Normocephalic and atraumatic.  Cardiovascular:     Rate and Rhythm: Normal rate and regular rhythm.  Pulmonary:     Effort: Pulmonary effort is normal. No respiratory distress.     Breath sounds: Normal breath sounds. No wheezing or rales.  Abdominal:     General: Bowel sounds are normal. There is no distension.     Palpations: Abdomen is soft.     Tenderness: There is no abdominal tenderness. There is no rebound.  Musculoskeletal:     Cervical back: Normal range of motion.  Skin:    General: Skin is warm and dry.  Neurological:     Mental Status: She is alert and oriented to person, place, and time.     Coordination: Coordination normal.     Vitals:   02/22/23 1336  BP: 120/80  Pulse: 70  Temp: 98.7 F (37.1 C)  TempSrc: Oral  SpO2: 98%  Weight: 134 lb (60.8 kg)  Height: 5' (1.524 m)    Assessment & Plan:

## 2023-02-22 NOTE — Assessment & Plan Note (Signed)
Taking nexium 40 mg daily and pepcid 20 mg BID. Refilled both and continue.

## 2023-02-25 ENCOUNTER — Telehealth: Payer: Self-pay | Admitting: Internal Medicine

## 2023-02-25 NOTE — Telephone Encounter (Signed)
Pt called wanting to speak with Dr. the about a medication the Dr. was inquiring about and had a question about her lab results

## 2023-03-05 NOTE — Telephone Encounter (Signed)
Please call patient back - [phone] - Please call on Monday -

## 2023-03-05 NOTE — Telephone Encounter (Signed)
Called patient and LVM.

## 2023-03-08 ENCOUNTER — Other Ambulatory Visit: Payer: Self-pay | Admitting: Internal Medicine

## 2023-03-08 MED ORDER — TRIAMTERENE-HCTZ 37.5-25 MG PO TABS: 0.5000 | ORAL_TABLET | Freq: Every day | ORAL | 3 refills | Status: DC

## 2023-03-08 NOTE — Telephone Encounter (Signed)
Please call patient back

## 2023-03-10 NOTE — Telephone Encounter (Signed)
Closing encounter due to this being resolved by provider in result note

## 2023-03-11 DIAGNOSIS — N3020 Other chronic cystitis without hematuria: Secondary | ICD-10-CM | POA: Diagnosis not present

## 2023-03-11 DIAGNOSIS — R300 Dysuria: Secondary | ICD-10-CM | POA: Diagnosis not present

## 2023-03-11 DIAGNOSIS — R102 Pelvic and perineal pain: Secondary | ICD-10-CM | POA: Diagnosis not present

## 2023-03-29 ENCOUNTER — Ambulatory Visit (INDEPENDENT_AMBULATORY_CARE_PROVIDER_SITE_OTHER): Payer: Medicare HMO | Admitting: Family Medicine

## 2023-03-29 ENCOUNTER — Other Ambulatory Visit: Payer: Self-pay | Admitting: Family Medicine

## 2023-03-29 ENCOUNTER — Ambulatory Visit (INDEPENDENT_AMBULATORY_CARE_PROVIDER_SITE_OTHER): Payer: Medicare HMO

## 2023-03-29 ENCOUNTER — Encounter: Payer: Self-pay | Admitting: Family Medicine

## 2023-03-29 VITALS — BP 128/76 | HR 80 | Temp 98.6°F | Resp 20 | Ht 60.0 in | Wt 135.0 lb

## 2023-03-29 DIAGNOSIS — M8580 Other specified disorders of bone density and structure, unspecified site: Secondary | ICD-10-CM | POA: Diagnosis not present

## 2023-03-29 DIAGNOSIS — M1612 Unilateral primary osteoarthritis, left hip: Secondary | ICD-10-CM | POA: Diagnosis not present

## 2023-03-29 DIAGNOSIS — M25551 Pain in right hip: Secondary | ICD-10-CM

## 2023-03-29 DIAGNOSIS — J069 Acute upper respiratory infection, unspecified: Secondary | ICD-10-CM

## 2023-03-29 DIAGNOSIS — M25552 Pain in left hip: Secondary | ICD-10-CM | POA: Diagnosis not present

## 2023-03-29 DIAGNOSIS — H0014 Chalazion left upper eyelid: Secondary | ICD-10-CM

## 2023-03-29 LAB — POCT RAPID STREP A (OFFICE): Rapid Strep A Screen: NEGATIVE

## 2023-03-29 NOTE — Progress Notes (Signed)
Assessment & Plan:  1. Viral URI Education provided on viral URI.  Discussed typical duration and progression of viral illnesses.  Encouraged symptom management including throat lozenges, chloraseptic spray, warm salt water gargles, hot tea/honey, cough syrup (Delsym), Tylenol/Ibuprofen, Vicks, and a humidifier at night.  - POCT rapid strep A  2. Bilateral hip pain Education provided on arthritis.  Encourage patient to continue taking Tylenol for pain.  Discussed max Tylenol dosage per day is 3,000 mg.  We also discussed if she wanted a hip injection she would need to see an orthopedic. - DG HIP UNILAT W OR W/O PELVIS 2-3 VIEWS RIGHT; Future  3. Chalazion of left upper eyelid Encouraged warm compresses throughout the day and washing the eye with Johnson's baby soap twice daily.   Follow up plan: Return if symptoms worsen or fail to improve.  Deliah Boston, MSN, APRN, FNP-C  Subjective:  HPI: [name] is a 77 y.o. female presenting on 03/29/2023 for Hip Pain (Bilateral - Left is worse - radiates to Left hip /) and Sore Throat (Sore throat started last night, some ear pain bilateral for 1 week.)  Patient complains of bilaterally hip pain (left>right). Onset of the symptoms was 2 weeks ago. Inciting event: none. Current symptoms include is better with lying down and sitting on a soft pillow . Associated symptoms: none. Patient has had no prior hip problems. Previous visits for this problem: none. Evaluation to date: none.  Treatment to date: OTC analgesics, which have been somewhat effective. Salonpas made it worse.   Patient also complains of cough, runny nose, sneezing, sore throat, and ear pain/pressure. She denies fever. Onset of ear pain was 1 week ago and occurs intermittently. The remaining symptoms started yesterday.  She is drinking plenty of fluids. Evaluation to date: at home COVID test negative. Treatment to date: none.  She does not smoke.    ROS: Negative unless  specifically indicated above in HPI.   Relevant past medical history reviewed and updated as indicated.   Allergies and medications reviewed and updated.   Current Outpatient Medications:    acetaminophen (TYLENOL) 500 MG tablet, Take 500-750 mg by mouth every 6 (six) hours as needed for pain., Disp: , Rfl:    alum & mag hydroxide-simeth (MAALOX/MYLANTA) 200-200-20 MG/5ML suspension, Take 15 mLs by mouth as needed for indigestion or heartburn., Disp: , Rfl:    estradiol (ESTRACE) 0.1 MG/GM vaginal cream, Place 1 Applicatorful vaginally 3 (three) times a week., Disp: 42.5 g, Rfl: 0   famotidine (PEPCID) 20 MG tablet, Take 1 tablet (20 mg total) by mouth 2 (two) times daily., Disp: 180 tablet, Rfl: 3   fluticasone (FLONASE) 50 MCG/ACT nasal spray, Place 2 sprays into both nostrils daily., Disp: 48 mL, Rfl: 3   Multiple Vitamin (MULTIVITAMIN) tablet, Take 1 tablet by mouth 3 (three) times a week., Disp: , Rfl:    Probiotic Product (PROBIOTIC-10 ULTIMATE) CAPS, Take by mouth daily at 6 (six) AM., Disp: , Rfl:    psyllium (METAMUCIL) 58.6 % packet, Take 1 packet by mouth daily., Disp: , Rfl:    rosuvastatin (CRESTOR) 20 MG tablet, TAKE 1 TABLET BY MOUTH THREE TIMES A WEEK, Disp: 36 tablet, Rfl: 3   triamterene-hydrochlorothiazide (MAXZIDE-25) 37.5-25 MG tablet, Take 0.5 tablets by mouth daily., Disp: 45 tablet, Rfl: 3   VITAMIN D PO, Take 1 tablet by mouth daily., Disp: , Rfl:    esomeprazole (NEXIUM) 40 MG capsule, Take 1 capsule (40 mg total) by mouth  daily. (Patient not taking: Reported on 03/29/2023), Disp: 90 capsule, Rfl: 3   nitroGLYCERIN (NITROSTAT) 0.4 MG SL tablet, Place 1 tablet (0.4 mg total) under the tongue every 5 (five) minutes as needed for chest pain. (Patient not taking: Reported on 01/11/2023), Disp: 25 tablet, Rfl: 3  Allergies  Allergen Reactions   Zocor [Simvastatin]     12/2013 CK 528   Advil [Ibuprofen] Swelling    Tongue swells   Bran [Wheat] Other (See Comments)     Very bad GI pain   Hydrocodone Nausea Only and Other (See Comments)     TACHYCARDIA   Hydrocodone-Acetaminophen Nausea Only    Other reaction(s): Increased Heart Rate (intolerance)   Metoprolol Tartrate Tinitus   Lipitor [Atorvastatin] Other (See Comments)    Bad dreams   Pravachol [Pravastatin Sodium] Other (See Comments)    Bad dreams    Objective:   BP 128/76   Pulse 80   Temp 98.6 F (37 C)   Resp 20   Ht 5' (1.524 m)   Wt 135 lb (61.2 kg)   BMI 26.37 kg/m    Physical Exam Vitals reviewed.  Constitutional:      General: She is not in acute distress.    Appearance: Normal appearance. She is not ill-appearing, toxic-appearing or diaphoretic.  HENT:     Head: Normocephalic and atraumatic.     Right Ear: Tympanic membrane, ear canal and external ear normal. There is no impacted cerumen.     Left Ear: Tympanic membrane, ear canal and external ear normal. There is no impacted cerumen.     Nose: Congestion present. No rhinorrhea.     Right Turbinates: Enlarged.     Left Turbinates: Enlarged.     Right Sinus: No maxillary sinus tenderness or frontal sinus tenderness.     Left Sinus: No maxillary sinus tenderness or frontal sinus tenderness.     Mouth/Throat:     Mouth: Mucous membranes are moist.     Pharynx: Oropharynx is clear. Posterior oropharyngeal erythema present. No oropharyngeal exudate.  Eyes:     General: No scleral icterus.       Right eye: No discharge.        Left eye: No foreign body, discharge or hordeolum.     Conjunctiva/sclera: Conjunctivae normal.     Right eye: Right conjunctiva is not injected. No chemosis, exudate or hemorrhage.    Left eye: Left conjunctiva is not injected. No chemosis, exudate or hemorrhage.    Comments: Left upper lid chalazion.  Cardiovascular:     Rate and Rhythm: Normal rate and regular rhythm.     Heart sounds: Normal heart sounds. No murmur heard.    No friction rub. No gallop.  Pulmonary:     Effort: Pulmonary effort  is normal. No respiratory distress.     Breath sounds: Normal breath sounds. No stridor. No wheezing, rhonchi or rales.  Musculoskeletal:        General: Normal range of motion.     Cervical back: Normal range of motion.     Right hip: Tenderness present. No deformity or lacerations. Normal range of motion.     Left hip: Tenderness present. No deformity or lacerations. Normal range of motion.     Comments: Pain bilaterally with internal hip rotation.  Lymphadenopathy:     Cervical: No cervical adenopathy.  Skin:    General: Skin is warm and dry.     Capillary Refill: Capillary refill takes less than 2 seconds.  Neurological:  General: No focal deficit present.     Mental Status: She is alert and oriented to person, place, and time. Mental status is at baseline.  Psychiatric:        Mood and Affect: Mood normal.        Behavior: Behavior normal.        Thought Content: Thought content normal.        Judgment: Judgment normal.

## 2023-03-29 NOTE — Patient Instructions (Addendum)
Continue Tylenol for pain.  Max Tylenol per day is 3,000 mg  Throat lozenges, chloraseptic spray, warm salt water gargles, hot tea/honey, cough syrup (Delsym), Tylenol Cold day and night, Vicks, and a humidifier at night.

## 2023-04-27 ENCOUNTER — Telehealth: Payer: Self-pay | Admitting: Internal Medicine

## 2023-04-27 DIAGNOSIS — I10 Essential (primary) hypertension: Secondary | ICD-10-CM

## 2023-04-27 NOTE — Telephone Encounter (Signed)
Patient called and wants to know if she can stop the [facility] and go back on the hydrochlorothizide and patient would also like a potassium supplement sent in with this prescription.  Please send to [facility] in [place].  Please call patient back and let her know - [phone]

## 2023-04-28 NOTE — Telephone Encounter (Signed)
Patient thinks this medication is causing hair loss and made appointment for labs

## 2023-04-28 NOTE — Telephone Encounter (Signed)
Patient does not want to have labs.  She had them done the end of August.

## 2023-04-28 NOTE — Telephone Encounter (Signed)
Please call patient at [phone]

## 2023-04-28 NOTE — Telephone Encounter (Signed)
Is there a reason for requesting change (I.e. side effect etc). Also she did not come back for follow up labs which were due around September to check is she willing to come for labs

## 2023-04-29 NOTE — Telephone Encounter (Signed)
Pt agrees to getting lab work done and states that if her labs come back normal can she stop taking the potassium due to it causing her hair to fall out

## 2023-05-03 ENCOUNTER — Ambulatory Visit (INDEPENDENT_AMBULATORY_CARE_PROVIDER_SITE_OTHER): Payer: Medicare HMO | Admitting: Gastroenterology

## 2023-05-03 ENCOUNTER — Encounter: Payer: Self-pay | Admitting: Gastroenterology

## 2023-05-03 VITALS — BP 120/70 | HR 96 | Ht 60.0 in | Wt 135.4 lb

## 2023-05-03 DIAGNOSIS — K625 Hemorrhage of anus and rectum: Secondary | ICD-10-CM

## 2023-05-03 DIAGNOSIS — K219 Gastro-esophageal reflux disease without esophagitis: Secondary | ICD-10-CM | POA: Diagnosis not present

## 2023-05-03 DIAGNOSIS — K648 Other hemorrhoids: Secondary | ICD-10-CM | POA: Diagnosis not present

## 2023-05-03 NOTE — Progress Notes (Signed)
    Assessment     Grade III bleeding internal hemorrhoids Constipation  Personal history of multiple adenomatous colon polyps in 2022 (one 24 mm, one 12 mm) GERD, small hiatal hernia   Recommendations    Prep H supp PR qd for 7 days then qd prn Rectal care instructions Benefiber daily and increase daily water intake. If constipation persists then add Miralax qd Discussed hemorrhoid banding if hemorrhoid symptoms persist  Continue Nexium 40 mg qd, famotidine 20 mg bid and follow antireflux measures Surveillance colonoscopy Oct 2027 if health status appropriate    HPI    This is a 77 year old female with intermittent small volume rectal bleeding, intermittent hemorrhoid prolapse with manual reduction required on a couple occasional and rectal irritation. She has used hemorrhoidal suppositories and creams with some benefit. She is frequently constipated and she notes straining increases her hemorrhoid symptoms. She has known internal hemorrhoids on colonoscopy. Stool Hemoccults in July 2024 were negative.  Denies weight loss, abdominal pain, diarrhea, change in stool caliber, melena, nausea, vomiting, dysphagia, reflux symptoms, chest pain.   Colonoscopy Oct 2022 - A tattoo was seen in the ascending colon. A post-polypectomy scar was found at the tattoo site.  - One 7 mm polyp in the transverse colon, removed with a cold snare. Resected and retrieved.  - Internal hemorrhoids.  - The examination was otherwise normal on direct and retroflexion views.   Labs / Imaging       Latest Ref Rng & Units 02/22/2023    2:09 PM 07/10/2022   10:49 AM 02/19/2022   10:45 AM  Hepatic Function  Total Protein 6.0 - 8.3 g/dL 7.1   7.1   Albumin 3.5 - 5.2 g/dL 4.2   4.2   AST 0 - 37 U/L 27   25   ALT 0 - 35 U/L 25  25  22    Alk Phosphatase 39 - 117 U/L 63   54   Total Bilirubin 0.2 - 1.2 mg/dL 0.9   1.1        Latest Ref Rng & Units 02/22/2023    2:09 PM 02/19/2022   10:45 AM 04/07/2021    10:05 AM  CBC  WBC 4.0 - 10.5 K/uL 6.0  5.6  4.7   Hemoglobin 12.0 - 15.0 g/dL 13.2  44.0  10.2   Hematocrit 36.0 - 46.0 % 41.9  41.4  40.5   Platelets 150.0 - 400.0 K/uL 288.0  263.0  286.0    Current Medications, Allergies, Past Medical History, Past Surgical History, Family History and Social History were reviewed in [employer] record.   Physical Exam: General: Well developed, well nourished, no acute distress Head: Normocephalic and atraumatic Eyes: Sclerae anicteric, EOMI Ears: Normal auditory acuity Mouth: No deformities or lesions noted Lungs: Clear throughout to auscultation Heart: Regular rate and rhythm; No murmurs, rubs or bruits Abdomen: Soft, non tender and non distended. No masses, hepatosplenomegaly or hernias noted. Normal Bowel sounds Rectal: Small external tags, no internal lesions, heme neg brown stool  Musculoskeletal: Symmetrical with no gross deformities  Pulses:  Normal pulses noted Extremities: No edema or deformities noted Neurological: Alert oriented x 4, grossly nonfocal Psychological:  Alert and cooperative. Normal mood and affect  Anoscopy: Grade III internal hemorrhoids - moderately erythematous  Kimberly Rottenberg T. Russella Dar, MD 05/03/2023, 1:15 PM

## 2023-05-03 NOTE — Addendum Note (Signed)
Addended by: Hillard Danker A on: 05/03/2023 08:05 AM   Modules accepted: Orders

## 2023-05-03 NOTE — Telephone Encounter (Signed)
Labs are in to get done. We can adjust from there.

## 2023-05-03 NOTE — Telephone Encounter (Signed)
Relayed message back to patient and there were no questions or concerns

## 2023-05-03 NOTE — Patient Instructions (Signed)
Start over the [employer] daily. If this does not help your symptoms of constipation then add Miralax daily.   Also, purchase over the counter preparation H suppositories at bedtime x 7 days, then as needed.   RECTAL CARE INSTRUCTIONS:  1. Sitz Baths twice a day for 10 minutes each. 2. Thoroughly clean and dry the rectum. 3. Put Tucks pad against the rectum at night. 4. Clean the rectum with Balenol lotion after each bowel movement.  Call our office if your symptoms are not better.  The [facility] providers would like to encourage you to use [facility] to communicate with providers for non-urgent requests or questions.  Due to long hold times on the telephone, sending your provider a message by [facility] may be a faster and more efficient way to get a response.  Please allow 48 business hours for a response.  Please remember that this is for non-urgent requests.   Thank you for choosing me and  Gastroenterology.  Venita Lick. Pleas Koch., MD., Clementeen Graham

## 2023-05-06 ENCOUNTER — Other Ambulatory Visit (INDEPENDENT_AMBULATORY_CARE_PROVIDER_SITE_OTHER): Payer: Medicare HMO

## 2023-05-06 DIAGNOSIS — I10 Essential (primary) hypertension: Secondary | ICD-10-CM

## 2023-05-06 LAB — BASIC METABOLIC PANEL WITH GFR
BUN: 13 mg/dL (ref 6–23)
CO2: 32 meq/L (ref 19–32)
Calcium: 10.1 mg/dL (ref 8.4–10.5)
Chloride: 99 meq/L (ref 96–112)
Creatinine, Ser: 0.77 mg/dL (ref 0.40–1.20)
GFR: 74.32 mL/min
Glucose, Bld: 115 mg/dL — ABNORMAL HIGH (ref 70–99)
Potassium: 3.5 meq/L (ref 3.5–5.1)
Sodium: 140 meq/L (ref 135–145)

## 2023-05-11 ENCOUNTER — Other Ambulatory Visit: Payer: Self-pay | Admitting: Internal Medicine

## 2023-05-11 MED ORDER — HYDROCHLOROTHIAZIDE 12.5 MG PO CAPS: 12.5000 mg | ORAL_CAPSULE | Freq: Every day | ORAL | 3 refills | Status: DC

## 2023-05-14 ENCOUNTER — Telehealth: Payer: Self-pay | Admitting: Internal Medicine

## 2023-05-14 NOTE — Telephone Encounter (Signed)
Pt called wanting to know why she was prescribed capsule and tablets for hydrochlorothiazide. Please advise

## 2023-05-17 NOTE — Telephone Encounter (Signed)
Called patient and lvm

## 2023-05-17 NOTE — Telephone Encounter (Signed)
She was prescribed her dose of hydrochlorothiazide. I am not sure I understand if there is a question here

## 2023-05-17 NOTE — Telephone Encounter (Signed)
There is no difference I'm assuming. Unless patient can't take a capsule and prefers a tablet

## 2023-05-21 NOTE — Telephone Encounter (Signed)
Pt called wanting to speak with nurse about her medication

## 2023-06-09 ENCOUNTER — Ambulatory Visit (INDEPENDENT_AMBULATORY_CARE_PROVIDER_SITE_OTHER): Payer: Medicare HMO | Admitting: Family Medicine

## 2023-06-09 VITALS — BP 113/70 | HR 75 | Ht 60.0 in | Wt 134.0 lb

## 2023-06-09 DIAGNOSIS — R102 Pelvic and perineal pain: Secondary | ICD-10-CM | POA: Insufficient documentation

## 2023-06-09 NOTE — Progress Notes (Signed)
   Subjective:    Patient ID: [name] is a 77 y.o. female presenting with Vaginal Pain  on 06/09/2023  HPI: Vaginal pain x many years. Has seen urology and noted to have all treatments done. They advised to f/u with GYN. Has been on Estrace cream for some time. She used just a dab on her finger to apply. She is not sexually active any longer. She is not sure what makes the pain worse or better. She is quite concerned she may have a cancer causing her pain. Denies any vaginal bleeding.  Review of Systems  Constitutional:  Negative for chills and fever.  Respiratory:  Negative for shortness of breath.   Cardiovascular:  Negative for chest pain.  Gastrointestinal:  Negative for abdominal pain, nausea and vomiting.  Genitourinary:  Negative for dysuria.  Skin:  Negative for rash.      Objective:    BP 113/70   Pulse 75   Ht 5' (1.524 m)   Wt 134 lb (60.8 kg)   BMI 26.17 kg/m  Physical Exam Exam conducted with a chaperone present.  Constitutional:      General: She is not in acute distress.    Appearance: She is well-developed.  HENT:     Head: Normocephalic and atraumatic.  Eyes:     General: No scleral icterus. Cardiovascular:     Rate and Rhythm: Normal rate.  Pulmonary:     Effort: Pulmonary effort is normal.  Abdominal:     Palpations: Abdomen is soft.  Genitourinary:    General: Normal vulva.     Comments: Uterus is surgically absent. The upper vagina is atrophic. The lower vagina has some improved estrogen effect. Musculoskeletal:     Cervical back: Neck supple.  Skin:    General: Skin is warm and dry.  Neurological:     Mental Status: She is alert and oriented to person, place, and time.         Assessment & Plan:   Problem List Items Addressed This Visit       Unprioritized   Vaginal pain - Primary    ? Related to atrophy--use applicator with Estrace 2x/week.       Return if symptoms worsen or fail to improve.  Reva Bores,  MD 06/09/2023 11:46 AM

## 2023-06-09 NOTE — Assessment & Plan Note (Signed)
?   Related to atrophy--use applicator with Estrace 2x/week.

## 2023-06-09 NOTE — Progress Notes (Signed)
Having vaginal pain. Has has total hysterectomy.

## 2023-07-05 ENCOUNTER — Ambulatory Visit (INDEPENDENT_AMBULATORY_CARE_PROVIDER_SITE_OTHER): Payer: Medicare HMO

## 2023-07-05 VITALS — Ht 60.0 in | Wt 134.0 lb

## 2023-07-05 DIAGNOSIS — Z0000 Encounter for general adult medical examination without abnormal findings: Secondary | ICD-10-CM

## 2023-07-05 NOTE — Patient Instructions (Signed)
 [name] , Thank you for taking time to come for your Medicare Wellness Visit. I appreciate your ongoing commitment to your health goals. Please review the following plan we discussed and let me know if I can assist you in the future.   Referrals/Orders/Follow-Ups/Clinician Recommendations: It was nice talking with you today.  You are due for a Shingles vaccine and a Bone Density screening.  You have an order for:   [x]   Bone Density     Please call for appointment:  [facility] [address] [place], [place] [address] [phone]  Make sure to wear two-piece clothing.  No lotions, powders, or deodorants the day of the appointment. Make sure to bring picture ID and insurance card.  Bring list of medications you are currently taking including any supplements.   Schedule your [place] screening mammogram through MyChart!   Log into your MyChart account.  Go to 'Visit' (or 'Appointments' if on mobile App) --> Schedule an Appointment  Under 'Select a Reason for Visit' choose the Mammogram Screening option.  Complete the pre-visit questions and select the time and place that best fits your schedule.    This is a list of the screening recommended for you and due dates:  Health Maintenance  Topic Date Due   Zoster (Shingles) Vaccine (1 of 2) 10/27/2023*   COVID-19 Vaccine (8 - 2024-25 season) 07/15/2023   Medicare Annual Wellness Visit  07/04/2024   Colon Cancer Screening  04/22/2026   DTaP/Tdap/Td vaccine (3 - Td or Tdap) 10/24/2031   Pneumonia Vaccine  Completed   Flu Shot  Completed   DEXA scan (bone density measurement)  Completed   Hepatitis C Screening  Completed   HPV Vaccine  Aged Out  *Topic was postponed. The date shown is not the original due date.    Advanced directives: (Declined) Advance directive discussed with you today. Even though you declined this today, please call our office should you change your mind, and we can give you the  proper paperwork for you to fill out.  Next Medicare Annual Wellness Visit scheduled for next year: Yes

## 2023-07-05 NOTE — Progress Notes (Signed)
 Subjective:   [name] is a 78 y.o. female who presents for Medicare Annual (Subsequent) preventive examination.  Visit Complete: Virtual I connected with  Shivangi L Baucum on 07/05/23 by a video and audio enabled telemedicine application and verified that I am speaking with the correct person using two identifiers.  Patient Location: Home  Provider Location: Office/Clinic  I discussed the limitations of evaluation and management by telemedicine. The patient expressed understanding and agreed to proceed.  Vital Signs: Because this visit was a virtual/telehealth visit, some criteria may be missing or patient reported. Any vitals not documented were not able to be obtained and vitals that have been documented are patient reported.   Cardiac Risk Factors include: advanced age (>51men, >41 women);hypertension;dyslipidemia     Objective:    Today's Vitals   07/05/23 1009  Weight: 134 lb (60.8 kg)  Height: 5' (1.524 m)   Body mass index is 26.17 kg/m.     07/05/2023   10:22 AM 07/16/2022    9:09 AM 06/18/2021    2:24 PM 05/10/2020   11:14 AM 07/26/2016   11:20 PM 07/18/2015    9:22 AM 11/19/2012    2:28 AM  Advanced Directives  Does Patient Have a Medical Advance Directive? No No No No No Yes Patient does not have advance directive;Patient would not like information  Copy of Healthcare Power of Attorney in Chart?      Yes   Would patient like information on creating a medical advance directive?  No - Patient declined No - Patient declined No - Patient declined No - Patient declined    Pre-existing out of facility DNR order (yellow form or pink MOST form)       No    Current Medications (verified) Outpatient Encounter Medications as of 07/05/2023  Medication Sig   acetaminophen  (TYLENOL ) 500 MG tablet Take 500-750 mg by mouth every 6 (six) hours as needed for pain.   alum & mag hydroxide-simeth (MAALOX/MYLANTA) 200-200-20 MG/5ML suspension Take 15 mLs by mouth as needed for  indigestion or heartburn.   esomeprazole  (NEXIUM ) 40 MG capsule Take 1 capsule (40 mg total) by mouth daily.   estradiol  (ESTRACE ) 0.1 MG/GM vaginal cream Place 1 Applicatorful vaginally 3 (three) times a week.   famotidine  (PEPCID ) 20 MG tablet Take 1 tablet (20 mg total) by mouth 2 (two) times daily. (Patient taking differently: Take 20 mg by mouth as needed for heartburn or indigestion (Patient takes it as needed).)   fluticasone  (FLONASE ) 50 MCG/ACT nasal spray Place 2 sprays into both nostrils daily.   hydrochlorothiazide  (MICROZIDE ) 12.5 MG capsule Take 1 capsule (12.5 mg total) by mouth daily.   Multiple Vitamin (MULTIVITAMIN) tablet Take 1 tablet by mouth 3 (three) times a week.   Probiotic Product (PROBIOTIC-10 ULTIMATE) CAPS Take by mouth daily at 6 (six) AM.   psyllium (METAMUCIL) 58.6 % packet Take 1 packet by mouth daily.   rosuvastatin  (CRESTOR ) 20 MG tablet TAKE 1 TABLET BY MOUTH THREE TIMES A WEEK   VITAMIN D PO Take 1 tablet by mouth daily.   No facility-administered encounter medications on file as of 07/05/2023.    Allergies (verified) Zocor  [simvastatin ], Advil [ibuprofen], Bran [wheat], Hydrocodone , Hydrocodone -acetaminophen , Metoprolol  tartrate, Lipitor [atorvastatin], and Pravachol [pravastatin sodium]   History: Past Medical History:  Diagnosis Date   Arthritis    KNEES   Blood transfusion without reported diagnosis    Cataract    CTS (carpal tunnel syndrome)    BILATERAL LEFT >  RIGHT (OCC. HAND NUMBNESS)   Female cystocele    GERD (gastroesophageal reflux disease)    H/O hiatal hernia    History of cervical dysplasia    CIN II   History of peritonitis    2006--  S/P PERFORATED BOWEL INJURY DURING SURGERY   Hyperlipidemia    Hypertension    Past Surgical History:  Procedure Laterality Date   ABDOMINAL HYSTERECTOMY  1991   W/ BILATERAL SALPINGOOPHORECTOMY   BREAST BIOPSY Right 2014   LAPAROSCOPIC CHOLECYSTECTOMY  2006   W/ PERFERATED BOWEL (INJURY)    NASAL SEPTUM SURGERY  70's   REPAIR DEVIATED SEPTUM   sepsis after gallbladder surgery     TUBAL LIGATION  1980'S   VAGINAL PROLAPSE REPAIR N/A 08/15/2012   Procedure: VAGINAL VAULT SUSPENSION;  Surgeon: Arlena LILLETTE Gal, MD;  Location: [facility];  Service: Urology;  Laterality: N/A;  ANTERIOR VAULT REPAIR WITH XENFORE   Family History  Problem Relation Age of Onset   Coronary artery disease Mother    Other Mother        cva   Hypertension Mother    Heart disease Mother    Stroke Father    Breast cancer Sister    Breast cancer Sister    Pancreatic cancer Brother    Aneurysm Brother        abdominal    Prostate cancer Brother    Kidney cancer Son    Diabetes Other    Coronary artery disease Other    Hypertension Other    Stroke Other    Cancer Other        breast   Colon cancer Neg Hx    Esophageal cancer Neg Hx    Stomach cancer Neg Hx    Liver disease Neg Hx    Social History   Socioeconomic History   Marital status: Widowed    Spouse name: Not on file   Number of children: 3   Years of education: Not on file   Highest education level: Not on file  Occupational History   Occupation: Retired  Tobacco Use   Smoking status: Never   Smokeless tobacco: Never  Vaping Use   Vaping status: Never Used  Substance and Sexual Activity   Alcohol use: No    Alcohol/week: 0.0 standard drinks of alcohol    Comment: no   Drug use: No   Sexual activity: Not Currently    Birth control/protection: Surgical  Other Topics Concern   Not on file  Social History Narrative   Married '68. 2 son - '69, '70; 1 son - '71: 2 grandchildren. 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      Son lives with her   Social Drivers of Health   Financial Resource Strain: Low Risk  (07/16/2022)   Overall Financial Resource Strain (CARDIA)    Difficulty of Paying Living Expenses: Not hard at all  Food Insecurity: No Food Insecurity (07/16/2022)   Hunger Vital Sign     Worried About Running Out of Food in the Last Year: Never true    Ran Out of Food in the Last Year: Never true  Transportation Needs: No Transportation Needs (07/16/2022)   PRAPARE - Administrator, Civil Service (Medical): No    Lack of Transportation (Non-Medical): No  Physical Activity: Inactive (07/16/2022)   Exercise Vital Sign    Days of Exercise per Week: 0 days    Minutes of Exercise per Session: 0 min  Stress: No  Stress Concern Present (07/16/2022)   [employer] of Occupational Health - Occupational Stress Questionnaire    Feeling of Stress : Not at all  Social Connections: Moderately Integrated (07/16/2022)   Social Connection and Isolation Panel [NHANES]    Frequency of Communication with Friends and Family: More than three times a week    Frequency of Social Gatherings with Friends and Family: Once a week    Attends Religious Services: More than 4 times per year    Active Member of [employer] or Organizations: Yes    Attends Banker Meetings: More than 4 times per year    Marital Status: Widowed    Tobacco Counseling Counseling given: Not Answered   Clinical Intake:  Pre-visit preparation completed: Yes  Pain : No/denies pain     BMI - recorded: 26.17 Nutritional Status: BMI 25 -29 Overweight Nutritional Risks: None Diabetes: No  How often do you need to have someone help you when you read instructions, pamphlets, or other written materials from your doctor or pharmacy?: 1 - Never  Interpreter Needed?: No  Information entered by :: Shivonne Schwartzman, RMA   Activities of Daily Living    07/05/2023   10:16 AM 07/16/2022    9:12 AM  In your present state of health, do you have any difficulty performing the following activities:  Hearing? 1 0  Comment some hearing loss   Vision? 0 0  Difficulty concentrating or making decisions? 0 0  Walking or climbing stairs? 0 0  Dressing or bathing? 0 0  Doing errands, shopping? 0 0  Preparing Food  and eating ? N N  Using the Toilet? N N  In the past six months, have you accidently leaked urine? N N  Do you have problems with loss of bowel control? N N  Managing your Medications? N N  Managing your Finances? N N  Housekeeping or managing your Housekeeping? N N    Patient Care Team: Rollene Almarie LABOR, MD as PCP - General (Internal Medicine) Santo Stanly LABOR, MD as PCP - Cardiology (Cardiology) Charmayne Molly, MD as Consulting Physician (Ophthalmology) Szabat, Toribio BROCKS, [facility] (Inactive) (Pharmacist)  Indicate any recent Medical Services you may have received from other than [facility] providers in the past year (date may be approximate).     Assessment:   This is a routine wellness examination for [name].  Hearing/Vision screen Hearing Screening - Comments:: some hearing loss Vision Screening - Comments:: Denies vision issues.    Goals Addressed   None   Depression Screen    03/29/2023   10:37 AM 02/22/2023    1:50 PM 01/11/2023    8:52 AM 07/16/2022    9:10 AM 06/12/2022   12:01 PM 04/28/2022    2:41 PM 06/18/2021    2:37 PM  PHQ 2/9 Scores  PHQ - 2 Score 0 0 0 0 0 0 0  PHQ- 9 Score  0         Fall Risk    07/05/2023   10:23 AM 02/22/2023    1:50 PM 01/11/2023    8:52 AM 10/08/2022   12:25 PM 07/16/2022    9:10 AM  Fall Risk   Falls in the past year? 0 0 0 1 0  Number falls in past yr: 0 0 0 0 0  Injury with Fall? 0 0 0 1 0  Risk for fall due to : No Fall Risks  No Fall Risks History of fall(s) No Fall Risks  Follow up Falls  prevention discussed;Falls evaluation completed Falls evaluation completed Falls evaluation completed Falls evaluation completed Falls prevention discussed    MEDICARE RISK AT HOME: Medicare Risk at Home Any stairs in or around the home?: Yes If so, are there any without handrails?: Yes Home free of loose throw rugs in walkways, pet beds, electrical cords, etc?: Yes Adequate lighting in your home to reduce risk of falls?: Yes Life  alert?: No Use of a cane, walker or w/c?: No Grab bars in the bathroom?: No Shower chair or bench in shower?: Yes Elevated toilet seat or a handicapped toilet?: No  TIMED UP AND GO:  Was the test performed?  No    Cognitive Function:        07/16/2022    9:12 AM 05/10/2020   11:15 AM  6CIT Screen  What Year? 0 points 0 points  What month? 0 points 0 points  What time? 0 points 0 points  Count back from 20 0 points 0 points  Months in reverse 0 points 0 points  Repeat phrase 0 points 0 points  Total Score 0 points 0 points    Immunizations Immunization History  Administered Date(s) Administered   Fluad Quad(high Dose 65+) 03/30/2019, 03/29/2020, 03/27/2021, 03/20/2022   Fluad Trivalent(High Dose 65+) 04/23/2023   Influenza Split 04/23/2011   Influenza Whole 05/02/2012   Influenza, High Dose Seasonal PF 02/26/2016, 05/14/2017, 04/12/2018, 03/27/2021   Influenza,inj,Quad PF,6+ Mos 05/10/2013, 06/08/2014, 05/03/2015   PFIZER Comirnaty(Gray Top)Covid-19 Tri-Sucrose Vaccine 11/13/2020   PFIZER(Purple Top)SARS-COV-2 Vaccination 08/11/2019, 09/02/2019, 04/27/2020   Pfizer Covid-19 Vaccine Bivalent Booster 24yrs & up 05/13/2021   Pfizer(Comirnaty)Fall Seasonal Vaccine 12 years and older 04/23/2022, 05/20/2023   Pneumococcal Conjugate-13 05/03/2015   Pneumococcal Polysaccharide-23 11/19/2010, 08/24/2022   Respiratory Syncytial Virus Vaccine,Recomb Aduvanted(Arexvy) 11/12/2022   Tdap 11/19/2010, 10/23/2021    TDAP status: Up to date  Flu Vaccine status: Up to date  Pneumococcal vaccine status: Up to date  Covid-19 vaccine status: Completed vaccines  Qualifies for Shingles Vaccine? Yes   Zostavax completed No   Shingrix Completed?: No.    Education has been provided regarding the importance of this vaccine. Patient has been advised to call insurance company to determine out of pocket expense if they have not yet received this vaccine. Advised may also receive vaccine at  local pharmacy or Health Dept. Verbalized acceptance and understanding.  Screening Tests Health Maintenance  Topic Date Due   Zoster Vaccines- Shingrix (1 of 2) Never done   COVID-19 Vaccine (8 - 2024-25 season) 07/15/2023   Medicare Annual Wellness (AWV)  07/04/2024   Colonoscopy  04/22/2026   DTaP/Tdap/Td (3 - Td or Tdap) 10/24/2031   Pneumonia Vaccine 26+ Years old  Completed   INFLUENZA VACCINE  Completed   DEXA SCAN  Completed   Hepatitis C Screening  Completed   HPV VACCINES  Aged Out    Health Maintenance  Health Maintenance Due  Topic Date Due   Zoster Vaccines- Shingrix (1 of 2) Never done    Colorectal cancer screening: Type of screening: Colonoscopy. Completed 04/22/2021. Repeat every 5 years  Mammogram status: Completed 10/26/2022. Repeat every year  Bone Density status: Ordered 07/05/2023. Pt provided with contact info and advised to call to schedule appt.  Lung Cancer Screening: (Low Dose CT Chest recommended if Age 53-80 years, 20 pack-year currently smoking OR have quit w/in 15years.) does not qualify.   Lung Cancer Screening Referral: N/A  Additional Screening:  Hepatitis C Screening: does qualify; Completed 11/15/2015  Vision Screening:  Recommended annual ophthalmology exams for early detection of glaucoma and other disorders of the eye. Is the patient up to date with their annual eye exam?  Yes  Who is the provider or what is the name of the office in which the patient attends annual eye exams? Dr. Charmayne If pt is not established with a provider, would they like to be referred to a provider to establish care? No .   Dental Screening: Recommended annual dental exams for proper oral hygiene   Community Resource Referral / Chronic Care Management: CRR required this visit?  No   CCM required this visit?  No     Plan:     I have personally reviewed and noted the following in the patient's chart:   Medical and social history Use of alcohol,  tobacco or illicit drugs  Current medications and supplements including opioid prescriptions. Patient is not currently taking opioid prescriptions. Functional ability and status Nutritional status Physical activity Advanced directives List of other physicians Hospitalizations, surgeries, and ER visits in previous 12 months Vitals Screenings to include cognitive, depression, and falls Referrals and appointments  In addition, I have reviewed and discussed with patient certain preventive protocols, quality metrics, and best practice recommendations. A written personalized care plan for preventive services as well as general preventive health recommendations were provided to patient.     Claudio Mondry L Kharter Sestak, CMA   07/05/2023   After Visit Summary: (MyChart) Due to this being a telephonic visit, the after visit summary with patients personalized plan was offered to patient via MyChart   Nurse Notes: Patient is due for a Shingrix vaccine.  She is also due for a DEXA, which order has been placed today.  Patient had no concerns to address today.

## 2023-07-12 ENCOUNTER — Telehealth: Payer: Self-pay

## 2023-07-12 NOTE — Telephone Encounter (Signed)
 Ok for office visit if needed

## 2023-07-12 NOTE — Telephone Encounter (Signed)
 We do not do Cortizone shots, per chart looks like had depo shot in july  Copied from CRM [id number]. Topic: General - Other >> Jul 12, 2023  9:27 AM Rosina BIRCH wrote: Reason for CRM: Pt called stating she would like to make an appointment for a Cortizone shot. Pt stated she had one in June

## 2023-07-14 ENCOUNTER — Ambulatory Visit: Payer: Medicare HMO | Attending: Internal Medicine | Admitting: Internal Medicine

## 2023-07-14 VITALS — BP 120/68 | HR 86 | Ht 60.0 in | Wt 134.0 lb

## 2023-07-14 DIAGNOSIS — I10 Essential (primary) hypertension: Secondary | ICD-10-CM

## 2023-07-14 DIAGNOSIS — I2510 Atherosclerotic heart disease of native coronary artery without angina pectoris: Secondary | ICD-10-CM | POA: Diagnosis not present

## 2023-07-14 DIAGNOSIS — E782 Mixed hyperlipidemia: Secondary | ICD-10-CM | POA: Diagnosis not present

## 2023-07-14 NOTE — Patient Instructions (Signed)
 Medication Instructions:  Your physician recommends that you continue on your current medications as directed. Please refer to the Current Medication list given to you today.  *If you need a refill on your cardiac medications before your next appointment, please call your pharmacy*   Lab Work: IN 3 MONTHS: FLP, ALT, Lpa (nothing to eat or drink 12 hours prior except water )  If you have labs (blood work) drawn today and your tests are completely normal, you will receive your results only by: MyChart Message (if you have MyChart) OR A paper copy in the mail If you have any lab test that is abnormal or we need to change your treatment, we will call you to review the results.   Testing/Procedures: NONE   Follow-Up: At [facility], you and your health needs are our priority.  As part of our continuing mission to provide you with exceptional heart care, we have created designated Provider Care Teams.  These Care Teams include your primary Cardiologist (physician) and Advanced Practice Providers (APPs -  Physician Assistants and Nurse Practitioners) who all work together to provide you with the care you need, when you need it.  Your next appointment:   1 year(s)  Provider:   Jann Melody, MD

## 2023-07-14 NOTE — Progress Notes (Signed)
 Cardiology Office Note:    Date:  07/14/2023   ID:  [name], DOB 1946-06-26, MRN [id number]  PCP:  Adelia Homestead, MD   [facility] Providers Cardiologist:  Jann Melody, MD     Referring MD: Adelia Homestead, *   CC: Mild non-obstructive CAD  History of Present Illness:    [name] is a 78 y.o. female with a hx of HTN, HLD complicated by statin intolerance (elevated CK per chart - she was unclear which CK bad dreams, failed simvastatin , pravastatin, and atorvastatin). 2023: has CT  with mild non-obstructive disease.  Had tinnitus on aspirin .  Started BB.  LDL above goal but was unable to take higher doses of conventional medicine. 2024: felt bac on zetia  and is on rosuvastatin  3X week  Miss [name], a 78 year old individual with a history of hypertension, hyperlipidemia, and mild nonobstructive coronary artery disease, presents for a routine follow-up. She has a history of statin intolerance, having failed simvastatin , pravastatin, and atorvastatin, and is currently on rosuvastatin  three times a week. She has previously experienced tinnitus with aspirin  use. She reports no current coronary symptoms and is on the highest tolerated medications.  She reports feeling generally well, with some stomach discomfort managed by another provider. She describes a "little feeling" experienced when walking from the parking lot to the clinic, but does not believe it is cardiac in nature. She has no chest pain, shortness of breath, syncope, or palpitations. She reports some ringing in her ears and stomach issues.  Her lifestyle includes housework, going up and down stairs, and occasional grocery shopping. She acknowledges the need for more dedicated exercise and reports doing leg exercises when sitting. Her diet typically includes a meat and two vegetables, with occasional desserts. She expresses willingness to reduce meat consumption and increase exercise for  better health management.  In 2023, a CT scan revealed mild blockages, but she has had no worsening of symptoms since then. She has declined the use of PCSK9 inhibitors and bempedoic acid in the past.    Past Medical History:  Diagnosis Date   Arthritis    KNEES   Blood transfusion without reported diagnosis    Cataract    CTS (carpal tunnel syndrome)    BILATERAL LEFT > RIGHT (OCC. HAND NUMBNESS)   Female cystocele    GERD (gastroesophageal reflux disease)    H/O hiatal hernia    History of cervical dysplasia    CIN II   History of peritonitis    2006--  S/P PERFORATED BOWEL INJURY DURING SURGERY   Hyperlipidemia    Hypertension     Past Surgical History:  Procedure Laterality Date   ABDOMINAL HYSTERECTOMY  1991   W/ BILATERAL SALPINGOOPHORECTOMY   BREAST BIOPSY Right 2014   LAPAROSCOPIC CHOLECYSTECTOMY  2006   W/ PERFERATED BOWEL (INJURY)   NASAL SEPTUM SURGERY  70's   REPAIR DEVIATED SEPTUM   sepsis after gallbladder surgery     TUBAL LIGATION  1980'S   VAGINAL PROLAPSE REPAIR N/A 08/15/2012   Procedure: VAGINAL VAULT SUSPENSION;  Surgeon: Edmund Gouge, MD;  Location: [facility];  Service: Urology;  Laterality: N/A;  ANTERIOR VAULT REPAIR WITH XENFORE    Current Medications: Current Meds  Medication Sig   acetaminophen  (TYLENOL ) 500 MG tablet Take 500-750 mg by mouth every 6 (six) hours as needed for pain.   alum & mag hydroxide-simeth (MAALOX/MYLANTA) 200-200-20 MG/5ML suspension Take 15 mLs by mouth as needed for  indigestion or heartburn.   esomeprazole  (NEXIUM ) 40 MG capsule Take 1 capsule (40 mg total) by mouth daily.   estradiol  (ESTRACE ) 0.1 MG/GM vaginal cream Place 1 Applicatorful vaginally 3 (three) times a week.   famotidine  (PEPCID ) 20 MG tablet Take 1 tablet (20 mg total) by mouth 2 (two) times daily. (Patient taking differently: Take 20 mg by mouth as needed for heartburn or indigestion (Patient takes it as needed).)   fluticasone   (FLONASE ) 50 MCG/ACT nasal spray Place 2 sprays into both nostrils daily. (Patient taking differently: Place 2 sprays into both nostrils as needed for allergies.)   hydrochlorothiazide  (MICROZIDE ) 12.5 MG capsule Take 1 capsule (12.5 mg total) by mouth daily.   Multiple Vitamin (MULTIVITAMIN) tablet Take 1 tablet by mouth 3 (three) times a week.   Probiotic Product (PROBIOTIC-10 ULTIMATE) CAPS Take by mouth daily at 6 (six) AM.   psyllium (METAMUCIL) 58.6 % packet Take 1 packet by mouth daily.   rosuvastatin  (CRESTOR ) 20 MG tablet TAKE 1 TABLET BY MOUTH THREE TIMES A WEEK   VITAMIN D PO Take 1 tablet by mouth daily.     Allergies:   Zocor  [simvastatin ], Advil [ibuprofen], Bran [wheat], Hydrocodone , Hydrocodone -acetaminophen , Metoprolol  tartrate, Lipitor [atorvastatin], and Pravachol [pravastatin sodium]   Social History   Socioeconomic History   Marital status: Widowed    Spouse name: Not on file   Number of children: 3   Years of education: Not on file   Highest education level: Not on file  Occupational History   Occupation: Retired  Tobacco Use   Smoking status: Never   Smokeless tobacco: Never  Vaping Use   Vaping status: Never Used  Substance and Sexual Activity   Alcohol use: No    Alcohol/week: 0.0 standard drinks of alcohol    Comment: no   Drug use: No   Sexual activity: Not Currently    Birth control/protection: Surgical  Other Topics Concern   Not on file  Social History Narrative   Married '68. 2 son - '69, '70; 1 son - '71: 2 grandchildren. 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      Son lives with her   Social Drivers of Health   Financial Resource Strain: Low Risk  (07/05/2023)   Overall Financial Resource Strain (CARDIA)    Difficulty of Paying Living Expenses: Not very hard  Food Insecurity: No Food Insecurity (07/05/2023)   Hunger Vital Sign    Worried About Running Out of Food in the Last Year: Never true    Ran Out of Food in the Last Year:  Never true  Transportation Needs: No Transportation Needs (07/05/2023)   PRAPARE - Administrator, Civil Service (Medical): No    Lack of Transportation (Non-Medical): No  Physical Activity: Inactive (07/05/2023)   Exercise Vital Sign    Days of Exercise per Week: 0 days    Minutes of Exercise per Session: 0 min  Stress: No Stress Concern Present (07/05/2023)   [employer] of Occupational Health - Occupational Stress Questionnaire    Feeling of Stress : Not at all  Social Connections: Moderately Integrated (07/05/2023)   Social Connection and Isolation Panel [NHANES]    Frequency of Communication with Friends and Family: More than three times a week    Frequency of Social Gatherings with Friends and Family: More than three times a week    Attends Religious Services: More than 4 times per year    Active Member of [employer] or Organizations: Yes  Attends Banker Meetings: Never    Marital Status: Widowed     Family History: The patient's family history includes Aneurysm in her brother; Breast cancer in her sister and sister; Cancer in an other family member; Coronary artery disease in her mother and another family member; Diabetes in an other family member; Heart disease in her mother; Hypertension in her mother and another family member; Kidney cancer in her son; Other in her mother; Pancreatic cancer in her brother; Prostate cancer in her brother; Stroke in her father and another family member. There is no history of Colon cancer, Esophageal cancer, Stomach cancer, or Liver disease.  ROS:   Please see the history of present illness.     EKGs/Labs/Other Studies Reviewed:    The following studies were reviewed today:  EKG:   03/11/22: SR with 84 with T wave flattening  Cardiac Studies & Procedures         CT SCANS  CT CORONARY MORPH W/CTA COR W/SCORE 03/31/2022  Addendum 03/31/2022  2:51 PM ADDENDUM REPORT: 03/31/2022 14:49  EXAM: OVER-READ INTERPRETATION   CT CHEST  The following report is an over-read performed by radiologist Dr. Mccoy Spell [facility] on 03/31/2022. This over-read does not include interpretation of cardiac or coronary anatomy or pathology. The coronary calcium  score and coronary CTA interpretations by the cardiologist are attached.  COMPARISON:  07/13/2005  FINDINGS: Aorta normal caliber. Atherosclerotic calcifications ascending thoracic aorta. No pericardial effusion. Central pulmonary arteries grossly patent. Esophagus unremarkable. No adenopathy. Visualized upper abdomen unremarkable. Dependent atelectasis BILATERAL lower lobes. Remaining lungs clear. No infiltrate or pleural effusion. Osseous structures unremarkable.  IMPRESSION: Dependent atelectasis BILATERAL lower lobes.  No additional significant extracardiac abnormalities.  Aortic Atherosclerosis (ICD10-I70.0).   Electronically Signed By: Wynelle Heather M.D. On: 03/31/2022 14:49  Narrative HISTORY: 78 yo female with chest pain, nonspecific  EXAM: Cardiac/Coronary CTA  TECHNIQUE: The patient was scanned on a [employer].  PROTOCOL: A 120 kV prospective scan was triggered in the descending thoracic aorta at 111 HU's. Axial non-contrast 3 mm slices were carried out through the heart. The data set was analyzed on a dedicated work station and scored using the Agatson method. Gantry rotation speed was 250 msecs and collimation was .6 mm. Beta blockade and 0.8 mg of sl NTG was given. The 3D data set was reconstructed in 5% intervals of the 35-75 % of the R-R cycle. Diastolic phases were analyzed on a dedicated work station using MPR, MIP and VRT modes. The patient received 100mL OMNIPAQUE  IOHEXOL  350 MG/ML SOLN of contrast.  FINDINGS: Quality: Good, HR 49  Coronary calcium  score: The patient's coronary artery calcium  score is 87.9, which places the patient in the 56th percentile.  Coronary arteries: Common left coronary  ostium.  Right dominance.  Right Coronary Artery: Dominant. Moderate-sized vessel without disease.  Left Main Coronary Artery: Common left coronary ostium.  Left Anterior Descending Coronary Artery: Moderate-sized anterior artery that reaches the apex. There is mild proximal 25-49% mixed stenosis.  Left Circumflex Artery: AV groove LCX - no disease.  Aorta: Normal size, 24 mm at the mid ascending aorta (level of the PA bifurcation) measured double oblique. Aortic atherosclerosis. No dissection.  Aortic Valve: Trileaflet. No calcifications.  Other findings:  Normal pulmonary vein drainage into the left atrium.  Normal left atrial appendage without a thrombus.  Normal size of the pulmonary artery.  IMPRESSION: 1. 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  2. Coronary calcium  score of 87.9.  This was 56th percentile for age and sex matched control.  3. Common left coronary ostium with right dominance.  4. Consider non-coronary causes of chest pain.  Electronically Signed: By: Hazle Lites M.D. On: 03/31/2022 12:40            Recent Labs: 02/22/2023: ALT 25; Hemoglobin 13.6; Platelets 288.0 05/06/2023: BUN 13; Creatinine, Ser 0.77; Potassium 3.5; Sodium 140  Recent Lipid Panel    Component Value Date/Time   CHOL 195 02/22/2023 1409   CHOL 272 (H) 05/22/2014 0956   TRIG 115.0 02/22/2023 1409   TRIG 111 05/22/2014 0956   HDL 61.70 02/22/2023 1409   HDL 67 05/22/2014 0956   CHOLHDL 3 02/22/2023 1409   VLDL 23.0 02/22/2023 1409   LDLCALC 110 (H) 02/22/2023 1409   LDLCALC 183 (H) 05/22/2014 0956   LDLDIRECT 124 (H) 07/10/2022 1049   LDLDIRECT 175.3 04/24/2008 0848       Physical Exam:    VS:  BP 120/68 (BP Location: Right Arm)   Pulse 86   Ht 5' (1.524 m)   Wt 134 lb (60.8 kg)   SpO2 94%   BMI 26.17 kg/m     Wt Readings from Last 3 Encounters:  07/14/23 134 lb (60.8 kg)  07/05/23 134 lb (60.8 kg)  06/09/23 134 lb (60.8 kg)    Gen: No distress    Neck: No JVD Cardiac: No Rubs or Gallops, No murmur, RRR +2 radial pulses Respiratory: Clear to auscultation bilaterally, normal effort, normal  respiratory rate GI: Soft, nontender, non-distended  MS: No  edema;  moves all extremities Integument: Skin feels warm Neuro:  At time of evaluation, alert and oriented to person/place/time/situation  Psych: Normal affect, patient feels well  ASSESSMENT:    1. Coronary artery disease involving native coronary artery of native heart without angina pectoris   2. Mixed hyperlipidemia   3. Essential hypertension      PLAN:    Hyperlipidemia with Statin Intolerance   Hyperlipidemia complicated by statin intolerance. She has failed multiple statins (simvastatin , pravastatin, atorvastatin) and is currently on rosuvastatin  three times a week. Mild nonobstructive coronary artery disease with no coronary symptoms. Highest tolerated medication dose. Declined PCSK9 inhibitors and bempedoic acid after discussing potential benefits. Emphasized aggressive prevention of further blockages.   - Check cholesterol and labs in three months   - Discuss potential use of PCSK9 inhibitors if cholesterol levels remain elevated   - Encourage dietary modifications to reduce red meat and baked potatoes   - Encourage regular exercise    Mild Nonobstructive Coronary Artery Disease   Confirmed by CT scan in 2023. No chest pain, shortness of breath, syncope, or arrhythmias. Primary goal is aggressive prevention of further blockages. Discussed colchicine but decided against it due to gastrointestinal side effects and existing stomach issues.   - Continue current blood pressure management   - Do not increase rosuvastatin  dose or frequency   - Consider reintroducing aspirin  only if disease worsens  ( I do not believe the tinnitus is ASA related) - Discuss potential use of colchicine if other measures fail, noting its gastrointestinal side effects    Hypertension    Well-controlled with current medication regimen.     General Health Maintenance   Discussed the importance of diet and exercise in overall health maintenance. Active with housework but needs more structured exercise.   - Encourage regular exercise   - Encourage dietary modifications to reduce red meat and baked potatoes    Follow-up  Offered PRN- will do one year    Medication Adjustments/Labs and Tests Ordered: Current medicines are reviewed at length with the patient today.  Concerns regarding medicines are outlined above.  Orders Placed This Encounter  Procedures   Lipid panel   Lipoprotein A (LPA)   ALT   EKG 12-Lead   No orders of the defined types were placed in this encounter.   Patient Instructions  Medication Instructions:  Your physician recommends that you continue on your current medications as directed. Please refer to the Current Medication list given to you today.  *If you need a refill on your cardiac medications before your next appointment, please call your pharmacy*   Lab Work: IN 3 MONTHS: FLP, ALT, Lpa (nothing to eat or drink 12 hours prior except water )  If you have labs (blood work) drawn today and your tests are completely normal, you will receive your results only by: MyChart Message (if you have MyChart) OR A paper copy in the mail If you have any lab test that is abnormal or we need to change your treatment, we will call you to review the results.   Testing/Procedures: NONE   Follow-Up: At [facility], you and your health needs are our priority.  As part of our continuing mission to provide you with exceptional heart care, we have created designated Provider Care Teams.  These Care Teams include your primary Cardiologist (physician) and Advanced Practice Providers (APPs -  Physician Assistants and Nurse Practitioners) who all work together to provide you with the care you need, when you need it.  Your next appointment:   1  year(s)  Provider:   Jann Melody, MD            Signed, Jann Melody, MD  07/14/2023 11:23 AM    [facility]

## 2023-07-15 ENCOUNTER — Encounter: Payer: Self-pay | Admitting: Internal Medicine

## 2023-07-15 ENCOUNTER — Ambulatory Visit (INDEPENDENT_AMBULATORY_CARE_PROVIDER_SITE_OTHER): Payer: Medicare HMO | Admitting: Internal Medicine

## 2023-07-15 ENCOUNTER — Ambulatory Visit (INDEPENDENT_AMBULATORY_CARE_PROVIDER_SITE_OTHER): Payer: Medicare HMO

## 2023-07-15 VITALS — BP 122/80 | HR 80 | Temp 98.6°F | Wt 137.0 lb

## 2023-07-15 DIAGNOSIS — R0602 Shortness of breath: Secondary | ICD-10-CM

## 2023-07-15 DIAGNOSIS — J984 Other disorders of lung: Secondary | ICD-10-CM | POA: Diagnosis not present

## 2023-07-15 DIAGNOSIS — M1711 Unilateral primary osteoarthritis, right knee: Secondary | ICD-10-CM | POA: Diagnosis not present

## 2023-07-15 DIAGNOSIS — I771 Stricture of artery: Secondary | ICD-10-CM | POA: Diagnosis not present

## 2023-07-15 DIAGNOSIS — M40209 Unspecified kyphosis, site unspecified: Secondary | ICD-10-CM | POA: Diagnosis not present

## 2023-07-15 MED ORDER — METHYLPREDNISOLONE ACETATE 40 MG/ML IJ SUSP
40.0000 mg | Freq: Once | INTRAMUSCULAR | Status: AC
  Administered 2023-07-15: 40 mg via INTRAMUSCULAR

## 2023-07-15 NOTE — Progress Notes (Signed)
   Subjective:   Patient ID: [name], female    DOB: [birth year], 78 y.o.   MRN: [id number]  Arthritis Pertinent negatives include no diarrhea.  The patient is a 78 YO female coming in for several concerns including right knee arthritis, some SOB on exertion and feeling in chest with walking. Saw cardiology yesterday and no problems with her heart or changes.    Joint Injection/Arthrocentesis  Date/Time: 07/15/2023 10:41 AM  Performed by: Myrlene Broker, MD Authorized by: Myrlene Broker, MD  Indications: pain  Body area: knee Joint: right knee Local anesthesia used: no (ethyl chloride spray)  Anesthesia: Local anesthesia used: no (ethyl chloride spray)  Sedation: Patient sedated: no  Preparation: Patient was prepped and draped in the usual sterile fashion. Needle size: 18 G Ultrasound guidance: no Approach: medial Aspirate characteristics: n/a. Aspirate amount: 0 mL Methylprednisolone amount: 40 mg Lidocaine 1% amount: 2 mL Patient tolerance: patient tolerated the procedure well with no immediate complications     Review of Systems  Constitutional: Negative.   HENT: Negative.    Eyes: Negative.   Respiratory:  Positive for shortness of breath. Negative for cough and chest tightness.   Cardiovascular:  Negative for chest pain, palpitations and leg swelling.  Gastrointestinal:  Negative for abdominal distention, abdominal pain, constipation, diarrhea, nausea and vomiting.  Musculoskeletal:  Positive for arthralgias, arthritis and myalgias.  Skin: Negative.   Neurological: Negative.   Psychiatric/Behavioral: Negative.      Objective:  Physical Exam Constitutional:      Appearance: She is well-developed.  HENT:     Head: Normocephalic and atraumatic.  Cardiovascular:     Rate and Rhythm: Normal rate and regular rhythm.  Pulmonary:     Effort: Pulmonary effort is normal. No respiratory distress.     Breath sounds: Normal breath sounds. No  wheezing or rales.  Abdominal:     General: Bowel sounds are normal. There is no distension.     Palpations: Abdomen is soft.     Tenderness: There is no abdominal tenderness. There is no rebound.  Musculoskeletal:        General: Tenderness present.     Cervical back: Normal range of motion.     Comments: Right knee injection done see note  Skin:    General: Skin is warm and dry.  Neurological:     Mental Status: She is alert and oriented to person, place, and time.     Coordination: Coordination normal.     Vitals:   07/15/23 0952  BP: 122/80  Pulse: 80  Temp: 98.6 F (37 C)  TempSrc: Oral  SpO2: 98%  Weight: 137 lb (62.1 kg)    Assessment & Plan:

## 2023-07-15 NOTE — Assessment & Plan Note (Signed)
Right knee injection done during visit see procedure note. Tolerated well.

## 2023-07-15 NOTE — Assessment & Plan Note (Signed)
Checking CXR today and overall this is stable. Suspect mild restriction from her scoliosis.

## 2023-07-21 ENCOUNTER — Encounter: Payer: Self-pay | Admitting: Internal Medicine

## 2023-08-09 ENCOUNTER — Telehealth: Payer: Self-pay | Admitting: Internal Medicine

## 2023-08-09 NOTE — Telephone Encounter (Signed)
 You agree with taking this patient?

## 2023-08-09 NOTE — Telephone Encounter (Signed)
 PATIENT IS CALLING IN TO REQUEST A NEW PATIENT APPT SHE STATED THAT SHE WAS TOLD TO CALL THE OFFICE AND HAVE A MESSAGE SENT OVER TO DR Rochelle Chu

## 2023-08-10 NOTE — Telephone Encounter (Signed)
May you please get this patient scheduled for a new patient appointment with Dr. Yetta Barre?

## 2023-08-24 ENCOUNTER — Ambulatory Visit: Payer: Medicare HMO | Admitting: Internal Medicine

## 2023-08-27 DIAGNOSIS — Z961 Presence of intraocular lens: Secondary | ICD-10-CM | POA: Diagnosis not present

## 2023-09-13 ENCOUNTER — Other Ambulatory Visit: Payer: Self-pay | Admitting: Internal Medicine

## 2023-09-13 DIAGNOSIS — Z1231 Encounter for screening mammogram for malignant neoplasm of breast: Secondary | ICD-10-CM

## 2023-09-14 ENCOUNTER — Encounter: Payer: Self-pay | Admitting: Internal Medicine

## 2023-09-14 ENCOUNTER — Ambulatory Visit: Payer: Medicare HMO | Admitting: Internal Medicine

## 2023-09-14 VITALS — BP 136/74 | HR 80 | Temp 98.7°F | Resp 16 | Ht 60.0 in | Wt 134.6 lb

## 2023-09-14 DIAGNOSIS — R7303 Prediabetes: Secondary | ICD-10-CM

## 2023-09-14 DIAGNOSIS — H903 Sensorineural hearing loss, bilateral: Secondary | ICD-10-CM

## 2023-09-14 DIAGNOSIS — I10 Essential (primary) hypertension: Secondary | ICD-10-CM | POA: Diagnosis not present

## 2023-09-14 DIAGNOSIS — I2510 Atherosclerotic heart disease of native coronary artery without angina pectoris: Secondary | ICD-10-CM

## 2023-09-14 DIAGNOSIS — K5904 Chronic idiopathic constipation: Secondary | ICD-10-CM | POA: Diagnosis not present

## 2023-09-14 DIAGNOSIS — E785 Hyperlipidemia, unspecified: Secondary | ICD-10-CM | POA: Diagnosis not present

## 2023-09-14 DIAGNOSIS — T50905A Adverse effect of unspecified drugs, medicaments and biological substances, initial encounter: Secondary | ICD-10-CM | POA: Diagnosis not present

## 2023-09-14 DIAGNOSIS — K219 Gastro-esophageal reflux disease without esophagitis: Secondary | ICD-10-CM

## 2023-09-14 LAB — CBC WITH DIFFERENTIAL/PLATELET
Basophils Absolute: 0 10*3/uL (ref 0.0–0.1)
Basophils Relative: 0.7 % (ref 0.0–3.0)
Eosinophils Absolute: 0.1 10*3/uL (ref 0.0–0.7)
Eosinophils Relative: 1 % (ref 0.0–5.0)
HCT: 41.1 % (ref 36.0–46.0)
Hemoglobin: 13.7 g/dL (ref 12.0–15.0)
Lymphocytes Relative: 45 % (ref 12.0–46.0)
Lymphs Abs: 2.4 10*3/uL (ref 0.7–4.0)
MCHC: 33.5 g/dL (ref 30.0–36.0)
MCV: 92 fl (ref 78.0–100.0)
Monocytes Absolute: 0.4 10*3/uL (ref 0.1–1.0)
Monocytes Relative: 8.6 % (ref 3.0–12.0)
Neutro Abs: 2.3 10*3/uL (ref 1.4–7.7)
Neutrophils Relative %: 44.7 % (ref 43.0–77.0)
Platelets: 297 10*3/uL (ref 150.0–400.0)
RBC: 4.46 Mil/uL (ref 3.87–5.11)
RDW: 14.1 % (ref 11.5–15.5)
WBC: 5.2 10*3/uL (ref 4.0–10.5)

## 2023-09-14 LAB — HEPATIC FUNCTION PANEL
ALT: 21 U/L (ref 0–35)
AST: 25 U/L (ref 0–37)
Albumin: 4.5 g/dL (ref 3.5–5.2)
Alkaline Phosphatase: 54 U/L (ref 39–117)
Bilirubin, Direct: 0.1 mg/dL (ref 0.0–0.3)
Total Bilirubin: 0.8 mg/dL (ref 0.2–1.2)
Total Protein: 7.4 g/dL (ref 6.0–8.3)

## 2023-09-14 LAB — BASIC METABOLIC PANEL
BUN: 13 mg/dL (ref 6–23)
CO2: 37 meq/L — ABNORMAL HIGH (ref 19–32)
Calcium: 10.6 mg/dL — ABNORMAL HIGH (ref 8.4–10.5)
Chloride: 98 meq/L (ref 96–112)
Creatinine, Ser: 0.71 mg/dL (ref 0.40–1.20)
GFR: 81.71 mL/min (ref >60.00)
Glucose, Bld: 86 mg/dL (ref 70–99)
Potassium: 3.7 meq/L (ref 3.5–5.1)
Sodium: 140 meq/L (ref 135–145)

## 2023-09-14 LAB — LIPID PANEL
Cholesterol: 194 mg/dL (ref 0–200)
HDL: 62 mg/dL (ref >39.00)
LDL Cholesterol: 104 mg/dL — ABNORMAL HIGH (ref 0–99)
NonHDL: 132.02
Total CHOL/HDL Ratio: 3
Triglycerides: 141 mg/dL (ref 0.0–149.0)
VLDL: 28.2 mg/dL (ref 0.0–40.0)

## 2023-09-14 LAB — HEMOGLOBIN A1C: Hgb A1c MFr Bld: 6.2 % (ref 4.6–6.5)

## 2023-09-14 LAB — TSH: TSH: 3.22 u[IU]/mL (ref 0.35–5.50)

## 2023-09-14 LAB — MAGNESIUM: Magnesium: 1.9 mg/dL (ref 1.5–2.5)

## 2023-09-14 NOTE — Progress Notes (Unsigned)
 Subjective:  Patient ID: [name], female    DOB: 02/03/1946  Age: 78 y.o. MRN: [id number]  CC: Hypothyroidism and Hypertension   HPI [name] presents for f/up ----   Discussed the use of AI scribe software for clinical note transcription with the patient, who gave verbal consent to proceed.  History of Present Illness   [name] is a 78 year old female with hypertension and prediabetes who presents for follow-up and blood work. She was referred by her cardiologist for blood work.  She is here for a follow-up visit and to complete blood work. She has hypertension and is currently taking hydrochlorothiazide. She feels generally well and reports no symptoms of low blood pressure such as weakness, dizziness, or lightheadedness, except for one instance of dizziness. She is concerned about stopping hydrochlorothiazide as it helps manage her blood pressure.  She has been diagnosed with prediabetes but reports no symptoms of high blood sugar such as excessive thirst, urination, or drastic changes in weight or appetite. She notes some weight loss, which she attributes to stress and lifestyle changes following her husband's passing three years ago.  She has a history of acid reflux for which she takes Nexium, preferring the brand over the generic due to better efficacy. No trouble with swallowing or painful swallowing.  She reports a year-long history of tinnitus, described as a 'ringing sensation' in her ears, which was evaluated by an ENT who found no cause. She notes a slight hearing loss as per the ENT's evaluation. The onset of tinnitus coincided with the use of a beta-blocker, which was discontinued.  She experiences occasional constipation for which she takes a stool softener, finding it beneficial.  She had a history of hemorrhoids, which were evaluated by a gastroenterologist last year and attributed to hemorrhoids. No current pain but mentions a persistent hemorrhoid  since childbirth 55 years ago.       History Daryl has a past medical history of Arthritis, Blood transfusion without reported diagnosis, Cataract, CTS (carpal tunnel syndrome), Female cystocele, GERD (gastroesophageal reflux disease), H/O hiatal hernia, History of cervical dysplasia, History of peritonitis, Hyperlipidemia, and Hypertension.   She has a past surgical history that includes Nasal septum surgery (70's); Laparoscopic cholecystectomy (2006); Tubal ligation (1980'S); Abdominal hysterectomy (1991); Vaginal prolapse repair (N/A, 08/15/2012); Breast biopsy (Right, 2014); and sepsis after gallbladder surgery.   Her family history includes Aneurysm in her brother; Breast cancer in her sister and sister; Cancer in an other family member; Coronary artery disease in her mother and another family member; Diabetes in an other family member; Heart disease in her mother; Hypertension in her mother and another family member; Kidney cancer in her son; Other in her mother; Pancreatic cancer in her brother; Prostate cancer in her brother; Stroke in her father and another family member.She reports that she has never smoked. She has never used smokeless tobacco. She reports that she does not drink alcohol and does not use drugs.  Outpatient Medications Prior to Visit  Medication Sig Dispense Refill   acetaminophen (TYLENOL) 500 MG tablet Take 500-750 mg by mouth every 6 (six) hours as needed for pain.     alum & mag hydroxide-simeth (MAALOX/MYLANTA) 200-200-20 MG/5ML suspension Take 15 mLs by mouth as needed for indigestion or heartburn.     esomeprazole (NEXIUM) 40 MG capsule Take 1 capsule (40 mg total) by mouth daily. 90 capsule 3   estradiol (ESTRACE) 0.1 MG/GM vaginal cream Place 1 Applicatorful vaginally 3 (  three) times a week. 42.5 g 0   famotidine (PEPCID) 20 MG tablet Take 1 tablet (20 mg total) by mouth 2 (two) times daily. (Patient taking differently: Take 20 mg by mouth as needed for heartburn  or indigestion (Patient takes it as needed).) 180 tablet 3   fluticasone (FLONASE) 50 MCG/ACT nasal spray Place 2 sprays into both nostrils daily. (Patient taking differently: Place 2 sprays into both nostrils as needed for allergies.) 48 mL 3   hydrochlorothiazide (MICROZIDE) 12.5 MG capsule Take 1 capsule (12.5 mg total) by mouth daily. 90 capsule 3   Multiple Vitamin (MULTIVITAMIN) tablet Take 1 tablet by mouth 3 (three) times a week.     Probiotic Product (PROBIOTIC-10 ULTIMATE) CAPS Take by mouth daily at 6 (six) AM.     psyllium (METAMUCIL) 58.6 % packet Take 1 packet by mouth daily.     rosuvastatin (CRESTOR) 20 MG tablet TAKE 1 TABLET BY MOUTH THREE TIMES A WEEK 36 tablet 3   VITAMIN D PO Take 1 tablet by mouth daily.     Wheat Dextrin (BENEFIBER DRINK MIX) PACK Take by mouth as needed.     No facility-administered medications prior to visit.    ROS Review of Systems  Objective:  BP 136/74 (BP Location: Left Arm, Patient Position: Sitting, Cuff Size: Small)   Pulse 80   Temp 98.7 F (37.1 C) (Oral)   Resp 16   Ht 5' (1.524 m)   Wt 134 lb 9.6 oz (61.1 kg)   SpO2 96%   BMI 26.29 kg/m   Physical Exam  Lab Results  Component Value Date   WBC 6.0 02/22/2023   HGB 13.6 02/22/2023   HCT 41.9 02/22/2023   PLT 288.0 02/22/2023   GLUCOSE 115 (H) 05/06/2023   CHOL 195 02/22/2023   TRIG 115.0 02/22/2023   HDL 61.70 02/22/2023   LDLDIRECT 124 (H) 07/10/2022   LDLCALC 110 (H) 02/22/2023   ALT 25 02/22/2023   AST 27 02/22/2023   NA 140 05/06/2023   K 3.5 05/06/2023   CL 99 05/06/2023   CREATININE 0.77 05/06/2023   BUN 13 05/06/2023   CO2 32 05/06/2023   TSH 1.74 05/16/2018   HGBA1C 6.1 02/22/2023     DG Chest 2 View Result Date: 07/21/2023 CLINICAL DATA:  Short this of breath on exertion. EXAM: CHEST - 2 VIEW COMPARISON:  08/05/2020 FINDINGS: Heart size is UPPER normal, accentuated by kyphosis. Thoracic aorta is tortuous. There are stable linear areas of scarring in  both lung bases. There are no focal consolidations or pleural effusions. No pulmonary edema. There is moderate midthoracic spondylosis. IMPRESSION: 1. No evidence for acute cardiopulmonary disease. 2. Stable bibasilar scarring. 3. Kyphosis. Electronically Signed   By: Norva Pavlov M.D.   On: 07/21/2023 07:53     Assessment & Plan:  Essential hypertension -     TSH; Future -     Basic metabolic panel; Future -     CBC with Differential/Platelet; Future -     Urinalysis, Routine w reflex microscopic; Future  Gastroesophageal reflux disease without esophagitis -     CBC with Differential/Platelet; Future  Sensorineural hearing loss (SNHL) of both ears -     Ambulatory referral to Audiology  Dyslipidemia, goal LDL below 70 -     Lipid panel; Future -     TSH; Future -     Hepatic function panel; Future  Prediabetes -     Hemoglobin A1c; Future -  Basic metabolic panel; Future  Chronic idiopathic constipation -     TSH; Future -     Basic metabolic panel; Future -     Magnesium; Future      Follow-up: Return in about 6 months (around 03/16/2024).  Sanda Linger, MD

## 2023-09-14 NOTE — Patient Instructions (Signed)
 Hypertension, Adult High blood pressure (hypertension) is when the force of blood pumping through the arteries is too strong. The arteries are the blood vessels that carry blood from the heart throughout the body. Hypertension forces the heart to work harder to pump blood and may cause arteries to become narrow or stiff. Untreated or uncontrolled hypertension can lead to a heart attack, heart failure, a stroke, kidney disease, and other problems. A blood pressure reading consists of a higher number over a lower number. Ideally, your blood pressure should be below 120/80. The first ("top") number is called the systolic pressure. It is a measure of the pressure in your arteries as your heart beats. The second ("bottom") number is called the diastolic pressure. It is a measure of the pressure in your arteries as the heart relaxes. What are the causes? The exact cause of this condition is not known. There are some conditions that result in high blood pressure. What increases the risk? Certain factors may make you more likely to develop high blood pressure. Some of these risk factors are under your control, including: Smoking. Not getting enough exercise or physical activity. Being overweight. Having too much fat, sugar, calories, or salt (sodium) in your diet. Drinking too much alcohol. Other risk factors include: Having a personal history of heart disease, diabetes, high cholesterol, or kidney disease. Stress. Having a family history of high blood pressure and high cholesterol. Having obstructive sleep apnea. Age. The risk increases with age. What are the signs or symptoms? High blood pressure may not cause symptoms. Very high blood pressure (hypertensive crisis) may cause: Headache. Fast or irregular heartbeats (palpitations). Shortness of breath. Nosebleed. Nausea and vomiting. Vision changes. Severe chest pain, dizziness, and seizures. How is this diagnosed? This condition is diagnosed by  measuring your blood pressure while you are seated, with your arm resting on a flat surface, your legs uncrossed, and your feet flat on the floor. The cuff of the blood pressure monitor will be placed directly against the skin of your upper arm at the level of your heart. Blood pressure should be measured at least twice using the same arm. Certain conditions can cause a difference in blood pressure between your right and left arms. If you have a high blood pressure reading during one visit or you have normal blood pressure with other risk factors, you may be asked to: Return on a different day to have your blood pressure checked again. Monitor your blood pressure at home for 1 week or longer. If you are diagnosed with hypertension, you may have other blood or imaging tests to help your health care provider understand your overall risk for other conditions. How is this treated? This condition is treated by making healthy lifestyle changes, such as eating healthy foods, exercising more, and reducing your alcohol intake. You may be referred for counseling on a healthy diet and physical activity. Your health care provider may prescribe medicine if lifestyle changes are not enough to get your blood pressure under control and if: Your systolic blood pressure is above 130. Your diastolic blood pressure is above 80. Your personal target blood pressure may vary depending on your medical conditions, your age, and other factors. Follow these instructions at home: Eating and drinking  Eat a diet that is high in fiber and potassium, and low in sodium, added sugar, and fat. An example of this eating plan is called the DASH diet. DASH stands for Dietary Approaches to Stop Hypertension. To eat this way: Eat  plenty of fresh fruits and vegetables. Try to fill one half of your plate at each meal with fruits and vegetables. Eat whole grains, such as whole-wheat pasta, brown rice, or whole-grain bread. Fill about one  fourth of your plate with whole grains. Eat or drink low-fat dairy products, such as skim milk or low-fat yogurt. Avoid fatty cuts of meat, processed or cured meats, and poultry with skin. Fill about one fourth of your plate with lean proteins, such as fish, chicken without skin, beans, eggs, or tofu. Avoid pre-made and processed foods. These tend to be higher in sodium, added sugar, and fat. Reduce your daily sodium intake. Many people with hypertension should eat less than 1,500 mg of sodium a day. Do not drink alcohol if: Your health care provider tells you not to drink. You are pregnant, may be pregnant, or are planning to become pregnant. If you drink alcohol: Limit how much you have to: 0-1 drink a day for women. 0-2 drinks a day for men. Know how much alcohol is in your drink. In the [place] one drink equals one 12 oz bottle of beer (355 mL), one 5 oz glass of wine (148 mL), or one 1 oz glass of hard liquor (44 mL). Lifestyle  Work with your health care provider to maintain a healthy body weight or to lose weight. Ask what an ideal weight is for you. Get at least 30 minutes of exercise that causes your heart to beat faster (aerobic exercise) most days of the week. Activities may include walking, swimming, or biking. Include exercise to strengthen your muscles (resistance exercise), such as Pilates or lifting weights, as part of your weekly exercise routine. Try to do these types of exercises for 30 minutes at least 3 days a week. Do not use any products that contain nicotine or tobacco. These products include cigarettes, chewing tobacco, and vaping devices, such as e-cigarettes. If you need help quitting, ask your health care provider. Monitor your blood pressure at home as told by your health care provider. Keep all follow-up visits. This is important. Medicines Take over-the-counter and prescription medicines only as told by your health care provider. Follow directions carefully. Blood  pressure medicines must be taken as prescribed. Do not skip doses of blood pressure medicine. Doing this puts you at risk for problems and can make the medicine less effective. Ask your health care provider about side effects or reactions to medicines that you should watch for. Contact a health care provider if you: Think you are having a reaction to a medicine you are taking. Have headaches that keep coming back (recurring). Feel dizzy. Have swelling in your ankles. Have trouble with your vision. Get help right away if you: Develop a severe headache or confusion. Have unusual weakness or numbness. Feel faint. Have severe pain in your chest or abdomen. Vomit repeatedly. Have trouble breathing. These symptoms may be an emergency. Get help right away. Call 911. Do not wait to see if the symptoms will go away. Do not drive yourself to the hospital. Summary Hypertension is when the force of blood pumping through your arteries is too strong. If this condition is not controlled, it may put you at risk for serious complications. Your personal target blood pressure may vary depending on your medical conditions, your age, and other factors. For most people, a normal blood pressure is less than 120/80. Hypertension is treated with lifestyle changes, medicines, or a combination of both. Lifestyle changes include losing weight, eating a healthy,  low-sodium diet, exercising more, and limiting alcohol. This information is not intended to replace advice given to you by your health care provider. Make sure you discuss any questions you have with your health care provider. Document Revised: 04/22/2021 Document Reviewed: 04/22/2021 Elsevier Patient Education  2024 [employer].

## 2023-09-15 ENCOUNTER — Encounter: Payer: Self-pay | Admitting: Internal Medicine

## 2023-09-15 ENCOUNTER — Telehealth: Payer: Self-pay | Admitting: Internal Medicine

## 2023-09-15 DIAGNOSIS — T50905A Adverse effect of unspecified drugs, medicaments and biological substances, initial encounter: Secondary | ICD-10-CM | POA: Insufficient documentation

## 2023-09-15 DIAGNOSIS — I2510 Atherosclerotic heart disease of native coronary artery without angina pectoris: Secondary | ICD-10-CM

## 2023-09-15 DIAGNOSIS — E782 Mixed hyperlipidemia: Secondary | ICD-10-CM

## 2023-09-15 LAB — URINALYSIS, ROUTINE W REFLEX MICROSCOPIC
Bilirubin Urine: NEGATIVE
Hgb urine dipstick: NEGATIVE
Ketones, ur: NEGATIVE
Leukocytes,Ua: NEGATIVE
Nitrite: NEGATIVE
RBC / HPF: NONE SEEN (ref 0–?)
Specific Gravity, Urine: 1.015 (ref 1.000–1.030)
Total Protein, Urine: NEGATIVE
Urine Glucose: NEGATIVE
Urobilinogen, UA: 0.2 (ref 0.0–1.0)
pH: 8 (ref 5.0–8.0)

## 2023-09-15 MED ORDER — AMLODIPINE BESYLATE 5 MG PO TABS: 5.0000 mg | ORAL_TABLET | Freq: Every day | ORAL | 1 refills | Status: DC

## 2023-09-15 NOTE — Telephone Encounter (Signed)
 Patient wants a call back to discuss lab orders.

## 2023-09-15 NOTE — Telephone Encounter (Signed)
 [place] Up:       Patient is retuning a call from today.

## 2023-09-15 NOTE — Telephone Encounter (Signed)
 Left a message to call back.

## 2023-09-15 NOTE — Telephone Encounter (Signed)
 Called pt was wondering where to go to have labs drawn.  Advised pt we have a lab in our office building in [address].  Pt then expressed had labs drawn today with PCP reviewed labs 3/18 LDL 104.   Pt reports only takes rosuvastatin 20 mg PO 3 times weekly d/t liver enzymes being elevated previously.  I advised pt I will send message to MD to see if he would like to wait until April when originally scheduled for labs or if he is okay with labs from 3/18 in Epic from PCP.

## 2023-09-17 ENCOUNTER — Telehealth: Payer: Self-pay | Admitting: *Deleted

## 2023-09-17 MED ORDER — ROSUVASTATIN CALCIUM 20 MG PO TABS: 20.0000 mg | ORAL_TABLET | ORAL | 3 refills | Status: DC

## 2023-09-17 MED ORDER — ROSUVASTATIN CALCIUM 20 MG PO TABS: ORAL_TABLET | ORAL | 3 refills | Status: DC

## 2023-09-17 NOTE — Progress Notes (Signed)
 Care Guide Pharmacy Note  09/17/2023 Name: [name] MRN: [id number] DOB: 11/16/1945  Referred By: Myrlene Broker, MD Reason for referral: Care Coordination (Outreach to schedule referral with pharmacist )   [name] is a 78 y.o. year old female who is a primary care patient of Myrlene Broker, MD.  Norval Morton was referred to the pharmacist for assistance related to: HTN  Successful contact was made with the patient to discuss pharmacy services including being ready for the pharmacist to call at least 5 minutes before the scheduled appointment time and to have medication bottles and any blood pressure readings ready for review. The patient agreed to meet with the pharmacist via telephone visit on 10/07/2023  Burman Nieves, CMA, Care Guide [facility], [facility] Guide Direct Dial: [phone]  Fax: [phone] Website: [email or web address]

## 2023-09-17 NOTE — Telephone Encounter (Signed)
 Called pt advised of MD response:  At our last visit: Declined PCSK9 inhibitors and bempedoic acid after discussing potential benefits. If this has changed we will get her in to Pharm D clinic to start.  If not, we will not make additional changes based on her wishes.   Pt reports does not want to start any new medications at this time.  Is willing to increase rosuvastatin to 4 days weekly.  Advised pt to have f/u FLP and LFT in 8 weeks d/t pt concern of elevated liver enzymes previously.   Pt is agreeable to this plan orders placed.

## 2023-09-23 ENCOUNTER — Ambulatory Visit: Attending: Internal Medicine | Admitting: Audiology

## 2023-09-23 DIAGNOSIS — H903 Sensorineural hearing loss, bilateral: Secondary | ICD-10-CM | POA: Diagnosis not present

## 2023-09-23 DIAGNOSIS — H9313 Tinnitus, bilateral: Secondary | ICD-10-CM | POA: Insufficient documentation

## 2023-09-23 NOTE — Procedures (Signed)
 Outpatient Audiology and [facility] [address] [place], [place]  [address] [phone]  AUDIOLOGICAL  EVALUATION  NAME: [name]     DOB:   [birth year]      MRN: [id number]                                                                                     DATE: 09/23/2023     REFERENT: Myrlene Broker, MD STATUS: Outpatient DIAGNOSIS: Sensorineural hearing loss, bilateral  History: [name] was seen for an audiological evaluation due to concerns regarding her tinnitus.  [name] reports her tinnitus is worsening.  She reports her tinnitus sounds like sizzling in her ears.  [name] reports intermittent aural fullness in both ears.  She also reports she feels her ears need to pop but will not.  [name] denies otalgia and dizziness.  [name] denies concerns regarding her hearing sensitivity.  [name] has been followed by [facility].  [name] was seen by [facility]NT on 09/10/2022.  [name] is audiological evaluation on 09/10/2022 showed a mild sensorineural hearing loss from 250 to 3000 Hz and moderate to moderately severe hearing loss from 4000 to 8000 Hz in both ears.  Hearing aids were recommended at that time.  [name] was evaluated by Dr. Jenne Pane, otolaryngologist for her tinnitus and hearing loss.  Dr. Jenne Pane recommended tinnitus management and recommended hearing aids.  Evaluation:  Otoscopy showed a clear view of the tympanic membranes, bilaterally Tympanometry results were consistent with normal middle ear pressure and normal tympanic membrane mobility (Type A) bilaterally Audiometric testing was completed using Conventional Audiometry techniques with insert earphones and TDH headphones. Test results are consistent with a mild to moderate sensorineural hearing loss, bilaterally. Speech Recognition Thresholds were obtained at 40 dB HL in the right ear and at 35 dB HL in the left ear. Word Recognition Testing was completed at 70 dB HL and [name] scored 88% in the right ear and 100%  in the left ear.  Results:  The test results were reviewed with [name].  Today's test results are consistent with a mild to moderate sensorineural hearing loss in both ears.  [name] will have hearing and communication difficulty in many listening environments.  She will benefit from the use of good communication strategies and the use of amplification.  [name] was counseled regarding the use of hearing aids and the benefit of hearing aids to help manage tinnitus.  Tinnitus management strategies were reviewed.  Kandice continues to report great concern regarding her tinnitus.  [name] also reported she is interested in another ENT evaluation for her tinnitus.  [name] was given a copy of her audiogram today.  This was also given tinnitus management strategies and given a list of hearing aid providers in the [place] area.  Recommendations: 1.   Communication needs assessment with an audiologist to further discuss amplification if motivated to pursue hearing aids 2.   Use of tinnitus management strategies 3.   Monitor hearing sensitivity   30 minutes spent testing and counseling on results.   If you have any questions please feel free to contact me at [phone].  Marton Redwood Audiologist, Au.D., CCC-A 09/23/2023  2:59  PM  Cc: Myrlene Broker, MD

## 2023-09-27 ENCOUNTER — Telehealth: Payer: Self-pay | Admitting: Internal Medicine

## 2023-09-27 NOTE — Telephone Encounter (Unsigned)
 Copied from CRM [id number]. Topic: Referral - Request for Referral >> Sep 24, 2023  2:36 PM Denese Killings wrote: Did the patient discuss referral with their provider in the last year? yes (If No - schedule appointment) (If Yes - send message)  Appointment offered? No  Type of order/referral and detailed reason for visit: Ear, Nose, and Throat specialist referral patient wants full examination done on ears before getting hearing aids. Audiologist stated that patient has mild to moderate hearing loss.  Preference of office, provider, location: Dr. Allena Katz in [facility]   If referral order, have you been seen by this specialty before? Yes (If Yes, this issue or another issue? When? Where? Dr. Jenne Pane in March 2024  Can we respond through MyChart? Yes

## 2023-09-29 ENCOUNTER — Other Ambulatory Visit: Payer: Self-pay | Admitting: Internal Medicine

## 2023-09-29 DIAGNOSIS — H903 Sensorineural hearing loss, bilateral: Secondary | ICD-10-CM

## 2023-09-29 DIAGNOSIS — H9313 Tinnitus, bilateral: Secondary | ICD-10-CM

## 2023-09-29 NOTE — Telephone Encounter (Signed)
 Copied from CRM [id number]. Topic: Referral - Question >> Sep 28, 2023  4:36 PM [name] wrote: Reason for CRM: Patient called in stating she was contacted by the clinic to discuss referral details. Patient states reason for referral is due to being told she needs a hearing aid after Dr. Yetta Barre recommended hearing test. Would like to get next steps with overall ear health due hearing several noises including ringing, heartbeat, popping noises, etc.

## 2023-09-29 NOTE — Telephone Encounter (Signed)
 Please advise for patient

## 2023-10-07 ENCOUNTER — Other Ambulatory Visit (INDEPENDENT_AMBULATORY_CARE_PROVIDER_SITE_OTHER): Admitting: Pharmacist

## 2023-10-07 VITALS — BP 139/74

## 2023-10-07 DIAGNOSIS — I10 Essential (primary) hypertension: Secondary | ICD-10-CM

## 2023-10-07 NOTE — Progress Notes (Signed)
 10/07/2023 Name: [name] MRN: [id number] DOB: August 19, 1945  Chief Complaint  Patient presents with   Hypertension   Medication Management    ARIELE VIDRIO is a 78 y.o. year old female who presented for a telephone visit.   They were referred to the pharmacist by their PCP for assistance in managing hypertension.    Subjective:  Care Team: Primary Care Provider: Dr. Yetta Barre Next Scheduled Visit: not scheduled  Medication Access/Adherence  Current Pharmacy:  [facility] [phone] - [place], [place] - [address] [place] [address] Phone: [phone] Fax: [phone]   Patient reports affordability concerns with their medications: No  Patient reports access/transportation concerns to their pharmacy: No  Patient reports adherence concerns with their medications:  No     Hypertension:  Current medications: amlodipine 5 mg daily Medications previously tried: hydrochlorothiazide stopped due to hypercalcemia  Patient has a validated, automated, upper arm home BP cuff Current blood pressure readings: BP 139/74, 134/74   Patient reports hypotensive s/sx including occasional dizziness, lightheadedness when she first started amlodipine - this has improved.     Objective:  Lab Results  Component Value Date   HGBA1C 6.2 09/14/2023    Lab Results  Component Value Date   CREATININE 0.71 09/14/2023   BUN 13 09/14/2023   NA 140 09/14/2023   K 3.7 09/14/2023   CL 98 09/14/2023   CO2 37 (H) 09/14/2023    Lab Results  Component Value Date   CHOL 194 09/14/2023   HDL 62.00 09/14/2023   LDLCALC 104 (H) 09/14/2023   LDLDIRECT 124 (H) 07/10/2022   TRIG 141.0 09/14/2023   CHOLHDL 3 09/14/2023    Medications Reviewed Today     Reviewed by Bonita Quin, RPH (Pharmacist) on 10/07/23 at 1007  Med List Status: <None>   Medication Order Taking? Sig Documenting Provider Last Dose Status Informant  acetaminophen (TYLENOL) 500 MG tablet  [id number]  Take 500-750 mg by mouth every 6 (six) hours as needed for pain. [provider]  Active Self  alum & mag hydroxide-simeth (MAALOX/MYLANTA) 200-200-20 MG/5ML suspension [id number]  Take 15 mLs by mouth as needed for indigestion or heartburn. [provider]  Active   amLODipine (NORVASC) 5 MG tablet [id number] Yes Take 1 tablet (5 mg total) by mouth daily. Etta Grandchild, MD Taking Active   esomeprazole (NEXIUM) 40 MG capsule [id number] Yes Take 1 capsule (40 mg total) by mouth daily. Myrlene Broker, MD Taking Active   estradiol (ESTRACE) 0.1 MG/GM vaginal cream [id number]  Place 1 Applicatorful vaginally 3 (three) times a week. Federico Flake, MD  Active   famotidine (PEPCID) 20 MG tablet [id number] Yes Take 1 tablet (20 mg total) by mouth 2 (two) times daily.  Patient taking differently: Take 20 mg by mouth as needed for heartburn or indigestion (Patient takes it as needed).   Myrlene Broker, MD Taking Active   fluticasone [employer] 50 MCG/ACT nasal spray [id number]  Place 2 sprays into both nostrils daily.  Patient taking differently: Place 2 sprays into both nostrils as needed for allergies.   Myrlene Broker, MD  Active   Multiple Vitamin (MULTIVITAMIN) tablet [id number]  Take 1 tablet by mouth 3 (three) times a week. [provider]  Active Self  Probiotic Product (PROBIOTIC-10 ULTIMATE) CAPS [id number]  Take by mouth daily at 6 (six) AM. [provider]  Active   psyllium (METAMUCIL) 58.6 % packet [id number]  Take 1 packet by mouth daily.  [provider]  Active   rosuvastatin (CRESTOR) 20 MG tablet [id number] Yes Take 1 tablet (20 mg total) by mouth 4 (four) times a week. TAKE 1 TABLET BY MOUTH FOUR TIMES A WEEK Chandrasekhar, Mahesh A, MD Taking Active   VITAMIN D PO [id number] Yes Take 1 tablet by mouth daily. [provider] Taking Active   Wheat Dextrin [employer] DRINK MIX) PACK [id number]  Take by mouth as  needed. [provider]  Active               Assessment/Plan:   Hypertension: - Currently borderline controlled, BP goal <130/80 without hypotension sx - Reviewed appropriate blood pressure monitoring technique and reviewed goal blood pressure. Recommended to check home blood pressure and heart rate regularly - Recommend to continue amlodipine 5 mg daily. Advised to call if she is experiencing Bps in the 140s systolic.   - Reminded to schedule 6 month follow up with PCP in September  Follow Up Plan: PRN  Arbutus Leas, PharmD, BCPS, CPP Clinical Pharmacist Practitioner [facility] [phone]

## 2023-10-07 NOTE — Patient Instructions (Signed)
 It was a pleasure speaking with you today!  Continue current regimen.  Check blood pressure at home regularly and contact us if you are seeing blood pressure numbers in the 140/90s.  Feel free to call with any questions or concerns!  Arbutus Leas, PharmD, BCPS, CPP Clinical Pharmacist Practitioner [facility] [phone]

## 2023-10-11 ENCOUNTER — Encounter: Payer: Self-pay | Admitting: Family Medicine

## 2023-10-11 ENCOUNTER — Ambulatory Visit (INDEPENDENT_AMBULATORY_CARE_PROVIDER_SITE_OTHER): Admitting: Family Medicine

## 2023-10-11 VITALS — BP 116/74 | HR 88 | Temp 98.0°F | Ht 60.0 in | Wt 136.0 lb

## 2023-10-11 DIAGNOSIS — J309 Allergic rhinitis, unspecified: Secondary | ICD-10-CM | POA: Diagnosis not present

## 2023-10-11 MED ORDER — LORATADINE 10 MG PO TABS: 10.0000 mg | ORAL_TABLET | Freq: Every day | ORAL | 1 refills | Status: AC

## 2023-10-11 NOTE — Patient Instructions (Signed)
 Treat your allergies with Claritin and Flonase.  If you are feeling worse or not improving in the 1-2 weeks, let us  know.

## 2023-10-11 NOTE — Progress Notes (Signed)
 Subjective:  RAPHAELLA LARKIN is a 78 y.o. female who presents for a 4 day hx of ST, sneezing and coughing.  Denies fever, chills, dizziness, chest pain, palpitations, shortness of breath, abdominal pain, N/V/D, urinary symptoms, LE edema.    She is taking Tylenol Sinus. She has Flonase at home.    ROS as in subjective.   Objective: Vitals:   10/11/23 1333  BP: 116/74  Pulse: 88  Temp: 98 F (36.7 C)  SpO2: 98%    General appearance: Alert, WD/WN, no distress                             Skin: warm, no rash                           Head: no sinus tenderness                            Eyes: conjunctiva normal, corneas clear, PERRLA                            Ears: pearly TMs, external ear canals normal                          Nose: septum midline, turbinates swollen, and clear discharge             Mouth/throat: MMM, tongue normal, mild pharyngeal erythema                           Neck: supple, no adenopathy, no thyromegaly, nontender                          Heart: RRR                         Lungs: CTA bilaterally, no wheezes, rales, or rhonchi      Assessment: Allergic rhinitis, unspecified seasonality, unspecified trigger   Plan: No red flag symptoms.  Start Claritin and Flonase.   Avoid pollen as much as possible.  Nasal saline spray for congestion.   Tylenol prn.   Call/return if worsening or if symptoms aren't resolving.

## 2023-10-12 ENCOUNTER — Telehealth: Payer: Self-pay | Admitting: Internal Medicine

## 2023-10-12 NOTE — Telephone Encounter (Signed)
 Copied from CRM [id number]. Topic: Clinical - Medical Advice >> Oct 12, 2023  9:00 AM Adonis Hoot wrote: Reason for CRM: Patient called in stating that she thinks that medication amLODipine (NORVASC) 5 MG tablet is causing some swelling in her ankles.She would like to know should she continue yo take the medication?

## 2023-10-12 NOTE — Telephone Encounter (Signed)
 Please reroute message to PCP, Dr. Rochelle Chu.

## 2023-10-12 NOTE — Telephone Encounter (Signed)
**Note De-identified  [place] Obfuscation** Please advise 

## 2023-10-12 NOTE — Telephone Encounter (Signed)
 Please advised. I sent it to Dr Vedia Geralds as he is the DOD and you are out of office. Please advise.

## 2023-10-13 ENCOUNTER — Other Ambulatory Visit: Payer: Self-pay | Admitting: Internal Medicine

## 2023-10-13 NOTE — Telephone Encounter (Signed)
 Patient wants to know what medication will she start taking since she's stopping the Amlodipine.

## 2023-10-13 NOTE — Telephone Encounter (Signed)
 Patient has been made aware. Gave a verbal understanding.

## 2023-10-27 ENCOUNTER — Ambulatory Visit
Admission: RE | Admit: 2023-10-27 | Discharge: 2023-10-27 | Disposition: A | Discharge status: Home / Self Care | Source: Ambulatory Visit | Attending: Internal Medicine | Admitting: Internal Medicine

## 2023-10-27 DIAGNOSIS — Z1231 Encounter for screening mammogram for malignant neoplasm of breast: Secondary | ICD-10-CM

## 2023-10-29 ENCOUNTER — Ambulatory Visit: Admitting: Internal Medicine

## 2023-11-01 ENCOUNTER — Other Ambulatory Visit: Payer: Self-pay | Admitting: Internal Medicine

## 2023-11-01 DIAGNOSIS — R928 Other abnormal and inconclusive findings on diagnostic imaging of breast: Secondary | ICD-10-CM

## 2023-11-02 ENCOUNTER — Ambulatory Visit: Payer: Medicare HMO | Admitting: Podiatry

## 2023-11-03 ENCOUNTER — Ambulatory Visit (INDEPENDENT_AMBULATORY_CARE_PROVIDER_SITE_OTHER): Admitting: Family Medicine

## 2023-11-03 ENCOUNTER — Other Ambulatory Visit: Payer: Self-pay

## 2023-11-03 ENCOUNTER — Ambulatory Visit (INDEPENDENT_AMBULATORY_CARE_PROVIDER_SITE_OTHER)

## 2023-11-03 VITALS — BP 140/82 | HR 72 | Ht 60.0 in | Wt 137.0 lb

## 2023-11-03 DIAGNOSIS — M1711 Unilateral primary osteoarthritis, right knee: Secondary | ICD-10-CM | POA: Diagnosis not present

## 2023-11-03 DIAGNOSIS — M545 Low back pain, unspecified: Secondary | ICD-10-CM | POA: Diagnosis not present

## 2023-11-03 DIAGNOSIS — M25561 Pain in right knee: Secondary | ICD-10-CM | POA: Diagnosis not present

## 2023-11-03 DIAGNOSIS — M419 Scoliosis, unspecified: Secondary | ICD-10-CM | POA: Diagnosis not present

## 2023-11-03 DIAGNOSIS — G8929 Other chronic pain: Secondary | ICD-10-CM

## 2023-11-03 DIAGNOSIS — M47816 Spondylosis without myelopathy or radiculopathy, lumbar region: Secondary | ICD-10-CM | POA: Diagnosis not present

## 2023-11-03 DIAGNOSIS — M4316 Spondylolisthesis, lumbar region: Secondary | ICD-10-CM | POA: Diagnosis not present

## 2023-11-03 NOTE — Patient Instructions (Addendum)
 Thank you for coming in today.   You received an injection today. Seek immediate medical attention if the joint becomes red, extremely painful, or is oozing fluid.   I've referred you to Physical Therapy. You can schedule the 1st visit at the Check Out Desk today before you leave  Please get an Xray today before you leave   Check back in 6 weeks

## 2023-11-03 NOTE — Progress Notes (Signed)
 Joanna Muck, PhD, LAT, ATC acting as a scribe for Garlan Juniper, MD.  [name] is a 78 y.o. female who presents to [facility] today for R knee pain ongoing since late June. Last steroid injection was Jan by PCP. Pt locates pain to the anterior-lateral aspect of her R knee.   R Knee swelling: slight Mechanical symptoms: no Aggravates: stairs Treatments tried: prior steroid injection, cream, salon pas  Additionally she notes pain in the right low back and superior posterior hip area.  This has been ongoing for about 2 weeks.  She denies pain radiating down her leg.  No fevers or chills.  Dx imaging: 12/24/22 R knee XR  Pertinent review of systems: No fevers or chills  Relevant historical information: Hypertension   Exam:  BP (!) 140/82   Pulse 72   Ht 5' (1.524 m)   Wt 137 lb (62.1 kg)   SpO2 97%   BMI 26.76 kg/m  General: Well Developed, well nourished, and in no acute distress.   MSK: Right knee mild effusion normal motion with crepitation.  L-spine nontender palpation midline.  Tender palpation right lumbar paraspinal musculature. Decreased lumbar motion.   Lab and Radiology Results  Procedure: Real-time Ultrasound Guided Injection of right knee joint superior lateral patella space Device: Philips Affiniti 50G/GE Logiq Images permanently stored and available for review in PACS Verbal informed consent obtained.  Discussed risks and benefits of procedure. Warned about infection, bleeding, hyperglycemia damage to structures among others. Patient expresses understanding and agreement Time-out conducted.   Noted no overlying erythema, induration, or other signs of local infection.   Skin prepped in a sterile fashion.   Local anesthesia: Topical Ethyl chloride.   With sterile technique and under real time ultrasound guidance: 40 mg of Kenalog and 2 mL of Marcaine  injected into knee joint. Fluid seen entering the joint capsule.    Completed without difficulty   Pain immediately resolved suggesting accurate placement of the medication.   Advised to call if fevers/chills, erythema, induration, drainage, or persistent bleeding.   Images permanently stored and available for review in the ultrasound unit.  Impression: Technically successful ultrasound guided injection.   X-ray images lumbar spine obtained today personally and independently interpreted. 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  No acute fractures are visible. Await formal radiology review    Assessment and Plan: 78 y.o. 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  She has significant degenerative changes seen on x-ray right knee June 2024.  Plan for intra-articular steroid injection.  If this shot does not last long enough we will would work on authorization for Zilretta  or gel injections.  She will let me know.  Chronic low back pain and right lateral and posterior hip pain due to DDD and muscle dysfunction.  Plan for trial physical therapy for both the back/hip pain and the knee pain.  This could help improve her current situation and augment any injections that may do.  Recheck in about 6 weeks.   PDMP not reviewed this encounter. Orders Placed This Encounter  Procedures   US  LIMITED JOINT SPACE STRUCTURES LOW RIGHT(NO LINKED CHARGES)    Reason for Exam (SYMPTOM  OR DIAGNOSIS REQUIRED):   right knee pain    Preferred imaging location?:   [facility] Lum[facility]ine 2-3 Views    Standing Status:   Future    Expiration Date:   12/04/2023    Reason for Exam (SYMPTOM  OR DIAGNOSIS REQUIRED):   low back pain    Preferred imaging location?:   [facility]   Ambulatory referral to Physical Therapy    Referral Priority:   Routine    Referral Type:   Physical Medicine    Referral Reason:   Specialty Services Required    Requested Specialty:   Physical Therapy    Number of Visits Requested:   1   No orders of the defined types were  placed in this encounter.    Discussed warning signs or symptoms. Please see discharge instructions. Patient expresses understanding.   The above documentation has been reviewed and is accurate and complete Garlan Juniper, M.D.

## 2023-11-04 ENCOUNTER — Other Ambulatory Visit: Payer: Self-pay | Admitting: Family Medicine

## 2023-11-04 ENCOUNTER — Ambulatory Visit (INDEPENDENT_AMBULATORY_CARE_PROVIDER_SITE_OTHER): Admitting: Podiatry

## 2023-11-04 ENCOUNTER — Encounter: Payer: Self-pay | Admitting: Podiatry

## 2023-11-04 DIAGNOSIS — M79675 Pain in left toe(s): Secondary | ICD-10-CM | POA: Diagnosis not present

## 2023-11-04 DIAGNOSIS — M79674 Pain in right toe(s): Secondary | ICD-10-CM

## 2023-11-04 DIAGNOSIS — B351 Tinea unguium: Secondary | ICD-10-CM

## 2023-11-04 NOTE — Progress Notes (Signed)
 Low back x-ray shows some mild arthritis in the base of the spine.

## 2023-11-04 NOTE — Progress Notes (Signed)
This patient presents to the office with chief complaint of long thick painful nails.  Patient says the nails are painful walking and wearing shoes.  This patient is unable to self treat.  This patient is unable to trim her nails since she is unable to reach her nails.  She presents to the office for preventative foot care services. ? ?General Appearance  Alert, conversant and in no acute stress. ? ?Vascular  Dorsalis pedis and posterior tibial  pulses are palpable  bilaterally.  Capillary return is within normal limits  bilaterally. Temperature is within normal limits  bilaterally. ? ?Neurologic  Senn-Weinstein monofilament wire test within normal limits  bilaterally. Muscle power within normal limits bilaterally. ? ?Nails Thick disfigured discolored nails with subungual debris  hallux nails  bilaterally. No evidence of bacterial infection or drainage bilaterally. ? ?Orthopedic  No limitations of motion  feet .  No crepitus or effusions noted.  No bony pathology or digital deformities noted. ? ?Skin  normotropic skin with no porokeratosis noted bilaterally.  No signs of infections or ulcers noted.    ? ?Onychomycosis  Nails  B/L.  Pain in right toes  Pain in left toes ? ?Debridement of nails both feet followed trimming the nails with dremel tool.   RTC 3 months. ? ? ?Nakia Koble DPM   ?

## 2023-11-08 ENCOUNTER — Encounter (HOSPITAL_COMMUNITY): Payer: Self-pay

## 2023-11-08 ENCOUNTER — Telehealth: Payer: Self-pay | Admitting: Family Medicine

## 2023-11-08 DIAGNOSIS — G8929 Other chronic pain: Secondary | ICD-10-CM

## 2023-11-08 NOTE — Telephone Encounter (Signed)
 Patient called and said she had some pain in her right hip start over the weekend. She states that it started on Saturday and was a catching pain and felt like a spasm. She says she had an xray taken last week and wants to know if it got any of her hip too. If not she says she may need to have an xray of the hip area done. Please Advise.

## 2023-11-08 NOTE — Telephone Encounter (Signed)
 Forwarding to Dr. Denyse Amass to review and advise.

## 2023-11-09 ENCOUNTER — Ambulatory Visit (INDEPENDENT_AMBULATORY_CARE_PROVIDER_SITE_OTHER)

## 2023-11-09 DIAGNOSIS — M25551 Pain in right hip: Secondary | ICD-10-CM | POA: Diagnosis not present

## 2023-11-09 DIAGNOSIS — M545 Low back pain, unspecified: Secondary | ICD-10-CM | POA: Diagnosis not present

## 2023-11-09 DIAGNOSIS — G8929 Other chronic pain: Secondary | ICD-10-CM

## 2023-11-09 DIAGNOSIS — M1611 Unilateral primary osteoarthritis, right hip: Secondary | ICD-10-CM | POA: Diagnosis not present

## 2023-11-09 NOTE — Telephone Encounter (Signed)
 The low back x-ray does not show the hip very well.  I am ordering a hip x-ray.

## 2023-11-09 NOTE — Telephone Encounter (Signed)
 Called pt and advised that order has been placed for XR L-spine. She will swing by the office today to have that done.

## 2023-11-09 NOTE — Addendum Note (Signed)
 Addended by: Syliva Even on: 11/09/2023 08:44 AM   Modules accepted: Orders

## 2023-11-11 ENCOUNTER — Ambulatory Visit: Payer: Self-pay | Admitting: Family Medicine

## 2023-11-11 DIAGNOSIS — I2510 Atherosclerotic heart disease of native coronary artery without angina pectoris: Secondary | ICD-10-CM | POA: Diagnosis not present

## 2023-11-11 DIAGNOSIS — E782 Mixed hyperlipidemia: Secondary | ICD-10-CM | POA: Diagnosis not present

## 2023-11-11 NOTE — Progress Notes (Signed)
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

## 2023-11-12 ENCOUNTER — Ambulatory Visit: Payer: Self-pay

## 2023-11-12 ENCOUNTER — Telehealth: Payer: Self-pay | Admitting: Family Medicine

## 2023-11-12 DIAGNOSIS — E782 Mixed hyperlipidemia: Secondary | ICD-10-CM

## 2023-11-12 DIAGNOSIS — G720 Drug-induced myopathy: Secondary | ICD-10-CM

## 2023-11-12 DIAGNOSIS — I2510 Atherosclerotic heart disease of native coronary artery without angina pectoris: Secondary | ICD-10-CM

## 2023-11-12 LAB — LIPID PANEL
Chol/HDL Ratio: 2.6 ratio (ref 0.0–4.4)
Cholesterol, Total: 174 mg/dL (ref 100–199)
HDL: 68 mg/dL (ref >39)
LDL Chol Calc (NIH): 92 mg/dL (ref 0–99)
Triglycerides: 76 mg/dL (ref 0–149)
VLDL Cholesterol Cal: 14 mg/dL (ref 5–40)

## 2023-11-12 LAB — ALT: ALT: 30 IU/L (ref 0–32)

## 2023-11-12 LAB — LIPOPROTEIN A (LPA): Lipoprotein (a): 61.7 nmol/L (ref <75.0)

## 2023-11-12 NOTE — Telephone Encounter (Signed)
 Patient called and she states she got her xray back and she is still in pain and it feels like a spasm. She states it is not all the time it comes unexpectedly.She wants to know if Dr. Alease Hunter may prescribe something  for pain or muscle relaxer. She states the pain comes and grabs and holds and she will stand still and then wait until it passes. It is intense at times. She asked if he had read the report from the xray yet. Please advise.

## 2023-11-14 MED ORDER — TIZANIDINE HCL 2 MG PO TABS: 2.0000 mg | ORAL_TABLET | Freq: Three times a day (TID) | ORAL | 1 refills | Status: AC | PRN

## 2023-11-14 NOTE — Telephone Encounter (Signed)
 I have prescribed tizanidine.  This is a muscle relaxer.  It will make you sleepy so it may be more of a bedtime medicine than help during the day.

## 2023-11-15 ENCOUNTER — Ambulatory Visit
Admission: RE | Admit: 2023-11-15 | Discharge: 2023-11-15 | Disposition: A | Discharge status: Home / Self Care | Source: Ambulatory Visit | Attending: Internal Medicine | Admitting: Internal Medicine

## 2023-11-15 ENCOUNTER — Ambulatory Visit: Admitting: Physical Therapy

## 2023-11-15 ENCOUNTER — Other Ambulatory Visit: Payer: Self-pay | Admitting: Internal Medicine

## 2023-11-15 DIAGNOSIS — D242 Benign neoplasm of left breast: Secondary | ICD-10-CM | POA: Diagnosis not present

## 2023-11-15 DIAGNOSIS — N6320 Unspecified lump in the left breast, unspecified quadrant: Secondary | ICD-10-CM

## 2023-11-15 DIAGNOSIS — N6325 Unspecified lump in the left breast, overlapping quadrants: Secondary | ICD-10-CM | POA: Diagnosis not present

## 2023-11-15 DIAGNOSIS — R928 Other abnormal and inconclusive findings on diagnostic imaging of breast: Secondary | ICD-10-CM

## 2023-11-15 DIAGNOSIS — R92322 Mammographic fibroglandular density, left breast: Secondary | ICD-10-CM | POA: Diagnosis not present

## 2023-11-15 HISTORY — PX: BREAST BIOPSY: SHX20

## 2023-11-16 LAB — SURGICAL PATHOLOGY

## 2023-11-17 ENCOUNTER — Telehealth: Payer: Self-pay | Admitting: Internal Medicine

## 2023-11-17 NOTE — Telephone Encounter (Signed)
 Pt would like a c/b about her results. Please advise

## 2023-11-18 NOTE — Telephone Encounter (Signed)
 Called pt to f/u lab results.  Pt reports only takes rosuvastatin  4 times weekly d/t hx of increased liver enzymes on another statin previously. Would like to know if can take medication daily instead of meeting with pharmacy clinic. Spoke with MD advises there are newer medications that can help lower cholesterol.  Choice is pt. Advised pt if decides to take rosuvastatin  daily will need 2 month f/u LFT to monitor liver function. Pt would like to think over options and will either call back or send in a my chart message with her decision.

## 2023-11-23 ENCOUNTER — Ambulatory Visit: Payer: Self-pay

## 2023-11-23 NOTE — Telephone Encounter (Signed)
 Chief Complaint: HTN Symptoms: "little dizzy now and then" Frequency: began Friday Pertinent Negatives: Patient denies all other symptoms Disposition: [] ED /[] Urgent Care (no appt availability in office) / [] Appointment(In office/virtual)/ []  [facility]/ [] Home Care/ [] Refused Recommended Disposition /[] [facility]/ [x]  Follow-up with PCP Additional Notes: Pt reports HTN on Friday. Pt states Friday her BP was 151/75 and she "didn't feel right." Pt endorses "once in a while I am dizzy." Pt states she took a hydrochlorothiazide  and some vinegar Friday and her BP improved. Her BP last night was 137/75 and she has not taken it yet today. Pt expects her BP to rise as the day goes on. Pt asking if she can resume taking hydrochlorothiazide . Pt states she was taking that medication and tolerated it well but was switched to amlodipine . Then, pt was taken off amlodipine  d/t swelling. Pt feels she needs to be on a BP medication. RN advised pt RN would relay concern to office. RN advised pt if she develops worsening dizziness, CP, difficulty breathing, H/A, blurry vision, vomiting, unsteady gait, or one-sided weakness to go to the ED. Pt verbalized understanding.    Copied from CRM [id number]. Topic: Clinical - Medical Advice >> Nov 23, 2023  9:28 AM Kevelyn M wrote: Reason for CRM: On Friday, blood pressure spiked, Dr. Rochelle Chu took her off of blood pressure meds. She was able to get blood pressure down because she took her old blood pressure medicine and vinegar. Can she go back on old med. for blood pressure. Please call her [phone] (M). Answer Assessment - Initial Assessment Questions 1. BLOOD PRESSURE: "What is the blood pressure?" "Did you take at least two measurements 5 minutes apart?"     Pt states her BP was high on Friday (151/75, 167/90). Pt took hydrochlorothiazide  and vinegar and states her BP came down. 135/72 on Saturday AM. Yesterday it was 158/78 in the AM. 158/73  yesterday afternoon. 137/75 last night. Pt states she will expect BP to rise today. 2. ONSET: "When did you take your blood pressure?"     Yesterday  3. HOW: "How did you take your blood pressure?" (e.g., automatic home BP monitor, visiting nurse)     Automatic cuff  4. HISTORY: "Do you have a history of high blood pressure?"     Yes  5. MEDICINES: "Are you taking any medicines for blood pressure?" "Have you missed any doses recently?"     Pt states she was taken off amlodipine  d/t swelling. Pt was taken off hydrochlorothiazide  to try amlodipine , has no problem with that medication. 6. OTHER SYMPTOMS: "Do you have any symptoms?" (e.g., blurred vision, chest pain, difficulty breathing, headache, weakness)     "Once in a while I feel a little dizzy, I didn't feel right."  Protocols used: Blood Pressure - High-A-AH

## 2023-11-23 NOTE — Therapy (Signed)
 OUTPATIENT PHYSICAL THERAPY EVALUATION   Patient Name: [name] MRN: [id number] DOB:Sep 10, 1945, 78 y.o., female Today's Date: 11/24/2023   END OF SESSION:  PT End of Session - 11/24/23 1406     Visit Number 1    Number of Visits 9    Date for PT Re-Evaluation 01/19/24    Authorization Type [employer] MCR    Progress Note Due on Visit 10    PT Start Time 1345    PT Stop Time 1425    PT Time Calculation (min) 40 min    Activity Tolerance Patient tolerated treatment well    Behavior During Therapy WFL for tasks assessed/performed             Past Medical History:  Diagnosis Date   Arthritis    KNEES   Blood transfusion without reported diagnosis    Cataract    CTS (carpal tunnel syndrome)    BILATERAL LEFT > RIGHT (OCC. HAND NUMBNESS)   Female cystocele    GERD (gastroesophageal reflux disease)    H/O hiatal hernia    History of cervical dysplasia    CIN II   History of peritonitis    2006--  S/P PERFORATED BOWEL INJURY DURING SURGERY   Hyperlipidemia    Hypertension    Past Surgical History:  Procedure Laterality Date   ABDOMINAL HYSTERECTOMY  1991   W/ BILATERAL SALPINGOOPHORECTOMY   BREAST BIOPSY Right 2014   BREAST BIOPSY Left 11/15/2023   US  LT BREAST BX W LOC DEV 1ST LESION IMG BX SPEC US  GUIDE 11/15/2023 [facility]   LAPAROSCOPIC CHOLECYSTECTOMY  2006   W/ PERFERATED BOWEL (INJURY)   NASAL SEPTUM SURGERY  70's   REPAIR DEVIATED SEPTUM   sepsis after gallbladder surgery     TUBAL LIGATION  1980'S   VAGINAL PROLAPSE REPAIR N/A 08/15/2012   Procedure: VAGINAL VAULT SUSPENSION;  Surgeon: Edmund Gouge, MD;  Location: [facility];  Service: Urology;  Laterality: N/A;  ANTERIOR VAULT REPAIR WITH XENFORE   Patient Active Problem List   Diagnosis Date Noted   Chronic pain of right knee 11/03/2023   Chronic right-sided low back pain without sciatica 11/03/2023   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 09/15/2023   Sensorineural hearing  loss (SNHL) of both ears 09/14/2023   Dyslipidemia, goal LDL below 70 09/14/2023   Prediabetes 09/14/2023   Chronic idiopathic constipation 09/14/2023   Primary osteoarthritis of right knee 07/15/2023   Coronary artery disease involving native coronary artery of native heart without angina pectoris 07/10/2022   Tinnitus, bilateral 04/28/2022   Statin myopathy 03/11/2022   Arthritis of left acromioclavicular joint 04/04/2019   Rotator cuff arthropathy of left shoulder 03/24/2017   Essential hypertension 10/03/2014   Impingement syndrome of left shoulder region 10/03/2014   Routine general medical examination at a health care facility 08/27/2014   GERD 03/17/2009    PCP: Arcadio Knuckles, MD  REFERRING PROVIDER: Syliva Even, MD  REFERRING DIAG: Chronic pain of right knee; Chronic right-sided low back pain without sciatica  Rationale for Evaluation and Treatment: Rehabilitation  THERAPY DIAG:  Pain in right hip  Chronic pain of right knee  Muscle weakness (generalized)  ONSET DATE: Chronic (referral date 11/03/2023)   SUBJECTIVE:          SUBJECTIVE STATEMENT: Patient reports she has had a lot of knee pain, mainly in the right knee, and also having pain in the right hip region. She was prescribed a muscle relaxer that has  been helping some for the right hip. She has had several cortisone shots in the right knee which has helped some. She denies any pain at the moment but did have a little pain in the right hip this morning. The pain can grab her when she is walking or moving around, and it can be very intense, and sometimes she has trouble getting up from a chair. Sometimes she will have some back pain if she is standing for long periods, feels tired.   PERTINENT HISTORY:  See PMH above  PAIN:  Are you having pain? Yes:  NPRS scale: 0/10 currently, 10/10 at worse Pain location: Right knee and hip Pain description: Sharp,grabbing Aggravating factors: Moving about, making a  wrong turn Relieving factors: Medication, rest  PRECAUTIONS: None  RED FLAGS: None   WEIGHT BEARING RESTRICTIONS: No  FALLS:  Has patient fallen in last 6 months? No  LIVING ENVIRONMENT: Lives with: lives with their son Lives in: House/apartment Stairs: Yes: External: 2-3 steps; on left going up  PLOF: Independent  PATIENT GOALS: Pain relief with her walking and activity   OBJECTIVE:  Note: Objective measures were completed at Evaluation unless otherwise noted. PATIENT SURVEYS:  PSFS: 5 Walking: 5 Getting up from chair: 5  COGNITION: Overall cognitive status: Within functional limits for tasks assessed   SENSATION: WFL  MUSCLE LENGTH: Not assessed  POSTURE:   Rounded shoulder and forward head posture, increased thoracic kyphosis  PALPATION: Mildly tender to palpation right gluteal region  LUMBAR ROM:   AROM eval  Flexion   Extension   Right lateral flexion   Left lateral flexion   Right rotation   Left rotation    (Blank rows = not tested)  LOWER EXTREMITY ROM:     Active  Right eval Left eval  Hip flexion    Hip extension    Hip abduction    Hip adduction    Hip internal rotation    Hip external rotation    Knee flexion    Knee extension    Ankle dorsiflexion    Ankle plantarflexion    Ankle inversion    Ankle eversion     (Blank rows = not tested)  LOWER EXTREMITY MMT:    MMT Right eval Left eval  Hip flexion 3+ 4-  Hip extension 3- 3  Hip abduction 3 3+  Hip adduction    Hip internal rotation    Hip external rotation    Knee flexion 4 4  Knee extension 4 4  Ankle dorsiflexion    Ankle plantarflexion    Ankle inversion    Ankle eversion     (Blank rows = not tested)  FUNCTIONAL TESTS:  5 times sit to stand: 20 seconds  GAIT: Assistive device utilized: None Level of assistance: Complete Independence Comments: Slightly antalgic on right   TREATMENT  [facility] Adult PT Treatment:                                                 DATE: 11/24/2023 Sit to stand 2 x 10 SLR x 10 each Bridge x 10 each Side clamshell 2 x 10 each Row with red x 10  PATIENT EDUCATION:  Education details: Exam findings, POC, HEP Person educated: Patient Education method: Explanation, Demonstration, Tactile cues, Verbal cues, and Handouts Education comprehension: verbalized understanding, returned demonstration, verbal cues required,  tactile cues required, and needs further education  HOME EXERCISE PROGRAM: Access Code: [email or web address]   ASSESSMENT: CLINICAL IMPRESSION: Patient is a 78 y.o. female who was seen today for physical therapy evaluation and treatment for chronic right knee and right hip pain. Her primary impairment seems to be strength deficit of the hips and knees likely contributing to her pain with activity and decreased tolerance for walking and weight bearing tasks.   OBJECTIVE IMPAIRMENTS: decreased activity tolerance, decreased strength, postural dysfunction, and pain.   ACTIVITY LIMITATIONS: carrying, lifting, standing, squatting, stairs, and locomotion level  PARTICIPATION LIMITATIONS: meal prep, cleaning, shopping, and community activity  PERSONAL FACTORS: Fitness, Past/current experiences, and Time since onset of injury/illness/exacerbation are also affecting patient's functional outcome.   REHAB POTENTIAL: Good  CLINICAL DECISION MAKING: Stable/uncomplicated  EVALUATION COMPLEXITY: Low   GOALS: Goals reviewed with patient? Yes  SHORT TERM GOALS: Target date: 12/22/2023  Patient will be I with initial HEP in order to progress with therapy. Baseline: HEP provided at eval Goal status: INITIAL  2.  Patient will report pain with activity </= 6/10 in order to reduce functional limitations  Baseline: patient reports 10/10 pain at worse with activity Goal status: INITIAL  LONG TERM GOALS: Target date: 01/19/2024  Patient will be I with final HEP to maintain progress from PT. Baseline: HEP provided at  eval Goal status: INITIAL  2.  Patient will report PSFS >/= 7 in order to indicate an improvement in her functional status Baseline: 5 Goal status: INITIAL  3.  Patient will demonstrate bilateral hip strength >/= 4/5 MMT in order to improve her walking and activity tolerance Baseline: see limitations above Goal status: INITIAL  4.  Patient will demonstrate bilateral knee strength 5/5 MMT in order to improve ability to stand from a chair Baseline: see limitations above Goal status: INITIAL  5. Patient will report 5xSTS </= 12 seconds to indicate improved LE strength and mobility   Baseline: 20 seconds  Goal status: INITIAL   PLAN: PT FREQUENCY: 1x/week  PT DURATION: 8 weeks  PLANNED INTERVENTIONS: 97164- PT Re-evaluation, 97110-Therapeutic exercises, 97530- Therapeutic activity, 97112- Neuromuscular re-education, 97535- Self Care, 54098- Manual therapy, Patient/Family education, Balance training, Stair training, Taping, Dry Needling, Joint mobilization, Joint manipulation, Spinal manipulation, Spinal mobilization, Cryotherapy, and Moist heat.  PLAN FOR NEXT SESSION: Review HEP and progress PRN, continue with strengthening for hip, knee, and posture, progress to upright and closed chain strengthening, core strengthening   Leah Primus, PT, DPT, LAT, ATC 11/24/23  3:33 PM Phone: [phone] Fax: [phone]   Referring diagnosis? M25.561 Treatment diagnosis? (if different than referring diagnosis) M25.551 What was this (referring dx) caused by? []  Surgery []  Fall [x]  Ongoing issue [x]  Arthritis []  Other: ____________  Laterality: [x]  Rt []  Lt []  Both  Check all possible CPT codes:  *CHOOSE 10 OR LESS*    See Planned Interventions listed in the Plan section of the Evaluation.

## 2023-11-24 ENCOUNTER — Other Ambulatory Visit: Payer: Self-pay

## 2023-11-24 ENCOUNTER — Ambulatory Visit (INDEPENDENT_AMBULATORY_CARE_PROVIDER_SITE_OTHER): Admitting: Physical Therapy

## 2023-11-24 ENCOUNTER — Telehealth: Payer: Self-pay | Admitting: Internal Medicine

## 2023-11-24 ENCOUNTER — Encounter: Payer: Self-pay | Admitting: Physical Therapy

## 2023-11-24 DIAGNOSIS — M25561 Pain in right knee: Secondary | ICD-10-CM | POA: Diagnosis not present

## 2023-11-24 DIAGNOSIS — M6281 Muscle weakness (generalized): Secondary | ICD-10-CM

## 2023-11-24 DIAGNOSIS — G8929 Other chronic pain: Secondary | ICD-10-CM

## 2023-11-24 DIAGNOSIS — M25551 Pain in right hip: Secondary | ICD-10-CM | POA: Diagnosis not present

## 2023-11-24 NOTE — Telephone Encounter (Signed)
**Note De-identified  [place] Obfuscation** Please advise 

## 2023-11-24 NOTE — Telephone Encounter (Signed)
 Is she willing to take amlodipine ?

## 2023-11-24 NOTE — Telephone Encounter (Signed)
 Pt does not want to take amlodipine  b/c it causes swelling and discomfort in her ankles. Pt is willing to go back to her previous RX, hydrochlorothiazide  (HYDRODIURIL ) to help control her BP if that is acceptable. She still has some from before and wants to know if she should take them.  Please call pt with an update about her medication status and sen rx to: [id number]  Phone: [phone]  Fax: [phone]

## 2023-11-24 NOTE — Patient Instructions (Signed)
 Access Code: [email or web address] URL: [email or web address] Date: 11/24/2023 Prepared by: Leah Primus  Exercises - Active Straight Leg Raise with Quad Set  - 1 x daily - 2 sets - 10 reps - Bridge  - 1 x daily - 2 sets - 10 reps - Clamshell  - 1 x daily - 2 sets - 10 reps - Sit to Stand  - 1 x daily - 2 sets - 10 reps - Standing Row with Anchored Resistance  - 1 x daily - 2 sets - 10 reps

## 2023-11-25 ENCOUNTER — Encounter: Payer: Self-pay | Admitting: Internal Medicine

## 2023-11-26 ENCOUNTER — Other Ambulatory Visit: Payer: Self-pay | Admitting: Internal Medicine

## 2023-11-26 DIAGNOSIS — I10 Essential (primary) hypertension: Secondary | ICD-10-CM

## 2023-11-26 MED ORDER — TORSEMIDE 10 MG PO TABS
10.0000 mg | ORAL_TABLET | Freq: Every day | ORAL | 0 refills | Status: DC
Start: 2023-11-26 — End: 2024-01-26

## 2023-11-26 NOTE — Telephone Encounter (Signed)
This is being handled via my chart message.

## 2023-11-26 NOTE — Telephone Encounter (Signed)
 This is being handled VIA Mychart message.

## 2023-11-26 NOTE — Telephone Encounter (Signed)
 Stop hydrochlorothiazide  - this has caused high calcium  Stop amlodipine  Start torsemide  once a day

## 2023-12-02 ENCOUNTER — Other Ambulatory Visit: Payer: Self-pay | Admitting: Internal Medicine

## 2023-12-02 DIAGNOSIS — I10 Essential (primary) hypertension: Secondary | ICD-10-CM

## 2023-12-07 ENCOUNTER — Other Ambulatory Visit: Payer: Self-pay

## 2023-12-07 ENCOUNTER — Encounter: Payer: Self-pay | Admitting: Physical Therapy

## 2023-12-07 ENCOUNTER — Ambulatory Visit (INDEPENDENT_AMBULATORY_CARE_PROVIDER_SITE_OTHER): Admitting: Physical Therapy

## 2023-12-07 DIAGNOSIS — G8929 Other chronic pain: Secondary | ICD-10-CM | POA: Diagnosis not present

## 2023-12-07 DIAGNOSIS — M6281 Muscle weakness (generalized): Secondary | ICD-10-CM

## 2023-12-07 DIAGNOSIS — M25551 Pain in right hip: Secondary | ICD-10-CM | POA: Diagnosis not present

## 2023-12-07 DIAGNOSIS — M25561 Pain in right knee: Secondary | ICD-10-CM | POA: Diagnosis not present

## 2023-12-07 NOTE — Therapy (Signed)
 OUTPATIENT PHYSICAL THERAPY TREATMENT   Patient Name: [name] MRN: [id number] DOB:[birth year], 78 y.o., female Today's Date: 12/07/2023   END OF SESSION:  PT End of Session - 12/07/23 1349     Visit Number 2    Number of Visits 9    Date for PT Re-Evaluation 01/19/24    Authorization Type [employer] M[employer]    Authorization Time Period 11/24/2023 - 01/19/2024    Authorization - Visit Number 2    Authorization - Number of Visits 8    Progress Note Due on Visit 10    PT Start Time 1345    PT Stop Time 1425    PT Time Calculation (min) 40 min    Activity Tolerance Patient tolerated treatment well    Behavior During Therapy WFL for tasks assessed/performed              Past Medical History:  Diagnosis Date   Arthritis    KNEES   Blood transfusion without reported diagnosis    Cataract    CTS (carpal tunnel syndrome)    BILATERAL LEFT > RIGHT (OCC. HAND NUMBNESS)   Female cystocele    GERD (gastroesophageal reflux disease)    H/O hiatal hernia    History of cervical dysplasia    CIN II   History of peritonitis    2006--  S/P PERFORATED BOWEL INJURY DURING SURGERY   Hyperlipidemia    Hypertension    Past Surgical History:  Procedure Laterality Date   ABDOMINAL HYSTERECTOMY  1991   W/ BILATERAL SALPINGOOPHORECTOMY   BREAST BIOPSY Right 2014   BREAST BIOPSY Left 11/15/2023   US  LT BREAST BX W LOC DEV 1ST LESION IMG BX SPEC US  GUIDE 11/15/2023 [facility]   LAPAROSCOPIC CHOLECYSTECTOMY  2006   W/ PERFERATED BOWEL (INJURY)   NASAL SEPTUM SURGERY  70's   REPAIR DEVIATED SEPTUM   sepsis after gallbladder surgery     TUBAL LIGATION  1980'S   VAGINAL PROLAPSE REPAIR N/A 08/15/2012   Procedure: VAGINAL VAULT SUSPENSION;  Surgeon: Edmund Gouge, MD;  Location: [facility] [place];  Service: Urology;  Laterality: N/A;  ANTERIOR VAULT REPAIR WITH XENFORE   Patient Active Problem List   Diagnosis Date Noted   Chronic pain of right knee 11/03/2023    Chronic right-sided low back pain without sciatica 11/03/2023   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 09/15/2023   Sensorineural hearing loss (SNHL) of both ears 09/14/2023   Dyslipidemia, goal LDL below 70 09/14/2023   Prediabetes 09/14/2023   Chronic idiopathic constipation 09/14/2023   Primary osteoarthritis of right knee 07/15/2023   Coronary artery disease involving native coronary artery of native heart without angina pectoris 07/10/2022   Tinnitus, bilateral 04/28/2022   Statin myopathy 03/11/2022   Arthritis of left acromioclavicular joint 04/04/2019   Rotator cuff arthropathy of left shoulder 03/24/2017   Essential hypertension 10/03/2014   Impingement syndrome of left shoulder region 10/03/2014   Routine general medical examination at a health care facility 08/27/2014   GERD 03/17/2009    PCP: Arcadio Knuckles, MD  REFERRING PROVIDER: Syliva Even, MD  REFERRING DIAG: Chronic pain of right knee; Chronic right-sided low back pain without sciatica  Rationale for Evaluation and Treatment: Rehabilitation  THERAPY DIAG:  Pain in right hip  Chronic pain of right knee  Muscle weakness (generalized)  ONSET DATE: Chronic (referral date 11/03/2023)   SUBJECTIVE:          SUBJECTIVE STATEMENT: Patient reports she wasn't able  to do the exercises every day but she feels like they are helping.   Eval: Patient reports she has had a lot of knee pain, mainly in the right knee, and also having pain in the right hip region. She was prescribed a muscle relaxer that has been helping some for the right hip. She has had several cortisone shots in the right knee which has helped some. She denies any pain at the moment but did have a little pain in the right hip this morning. The pain can grab her when she is walking or moving around, and it can be very intense, and sometimes she has trouble getting up from a chair. Sometimes she will have some back pain if she is standing for long periods, feels tired.    PERTINENT HISTORY:  See PMH above  PAIN:  Are you having pain? Yes:  NPRS scale: 0/10 currently, 10/10 at worse Pain location: Right knee and hip Pain description: Sharp,grabbing Aggravating factors: Moving about, making a wrong turn Relieving factors: Medication, rest  PRECAUTIONS: None  PATIENT GOALS: Pain relief with her walking and activity   OBJECTIVE:  Note: Objective measures were completed at Evaluation unless otherwise noted. PATIENT SURVEYS:  PSFS: 5 Walking: 5 Getting up from chair: 5  MUSCLE LENGTH: Not assessed  POSTURE:   Rounded shoulder and forward head posture, increased thoracic kyphosis  PALPATION: Mildly tender to palpation right gluteal region  LUMBAR ROM:   AROM eval  Flexion   Extension   Right lateral flexion   Left lateral flexion   Right rotation   Left rotation    (Blank rows = not tested)  LOWER EXTREMITY ROM:     Active  Right eval Left eval  Hip flexion    Hip extension    Hip abduction    Hip adduction    Hip internal rotation    Hip external rotation    Knee flexion    Knee extension    Ankle dorsiflexion    Ankle plantarflexion    Ankle inversion    Ankle eversion     (Blank rows = not tested)  LOWER EXTREMITY MMT:    MMT Right eval Left eval  Hip flexion 3+ 4-  Hip extension 3- 3  Hip abduction 3 3+  Hip adduction    Hip internal rotation    Hip external rotation    Knee flexion 4 4  Knee extension 4 4  Ankle dorsiflexion    Ankle plantarflexion    Ankle inversion    Ankle eversion     (Blank rows = not tested)  FUNCTIONAL TESTS:  5 times sit to stand: 20 seconds  GAIT: Assistive device utilized: None Level of assistance: Complete Independence Comments: Slightly antalgic on right   TREATMENT  [facility] Adult PT Treatment:                                                DATE: 12/07/2023 Recumbent bike L3 x 5 min to improve endurance and workload capacity SLR 2 x 10 each Hooklying clamshell with  red 2 x 10 Bridge 2 x 10 Side clamshell 3 x 10 each Sit to stand 2 x 10 LAQ with 5# 3 x 10 each Standing heel raises 2 x 10 Standing hip abduction and extension with red at knees x 10 each Row with green 2  x 10  PATIENT EDUCATION:  Education details: HEP Person educated: Patient Education method: Explanation, Demonstration, Actor cues, Verbal cues Education comprehension: verbalized understanding, returned demonstration, verbal cues required, tactile cues required, and needs further education  HOME EXERCISE PROGRAM: Access Code: [email or web address]   ASSESSMENT: CLINICAL IMPRESSION: Patient tolerated therapy well with no adverse effects. Therapy focused on continued progression of hip, LE, and postural strengthening. She was able to progress with strengthening exercises and tolerated more standing exercises well without report of increased pain. No changes made to HEP this visit. Patient would benefit from continued skilled PT to progress mobility and strength in order to reduce pain and maximize functional ability.   Eval: Patient is a 77 y.o. female who was seen today for physical therapy evaluation and treatment for chronic right knee and right hip pain. Her primary impairment seems to be strength deficit of the hips and knees likely contributing to her pain with activity and decreased tolerance for walking and weight bearing tasks.   OBJECTIVE IMPAIRMENTS: decreased activity tolerance, decreased strength, postural dysfunction, and pain.   ACTIVITY LIMITATIONS: carrying, lifting, standing, squatting, stairs, and locomotion level  PARTICIPATION LIMITATIONS: meal prep, cleaning, shopping, and community activity  PERSONAL FACTORS: Fitness, Past/current experiences, and Time since onset of injury/illness/exacerbation are also affecting patient's functional outcome.    GOALS: Goals reviewed with patient? Yes  SHORT TERM GOALS: Target date: 12/22/2023  Patient will be I with initial HEP in  order to progress with therapy. Baseline: HEP provided at eval Goal status: INITIAL  2.  Patient will report pain with activity </= 6/10 in order to reduce functional limitations  Baseline: patient reports 10/10 pain at worse with activity Goal status: INITIAL  LONG TERM GOALS: Target date: 01/19/2024  Patient will be I with final HEP to maintain progress from PT. Baseline: HEP provided at eval Goal status: INITIAL  2.  Patient will report PSFS >/= 7 in order to indicate an improvement in her functional status Baseline: 5 Goal status: INITIAL  3.  Patient will demonstrate bilateral hip strength >/= 4/5 MMT in order to improve her walking and activity tolerance Baseline: see limitations above Goal status: INITIAL  4.  Patient will demonstrate bilateral knee strength 5/5 MMT in order to improve ability to stand from a chair Baseline: see limitations above Goal status: INITIAL  5. Patient will report 5xSTS </= 12 seconds to indicate improved LE strength and mobility   Baseline: 20 seconds  Goal status: INITIAL   PLAN: PT FREQUENCY: 1x/week  PT DURATION: 8 weeks  PLANNED INTERVENTIONS: 97164- PT Re-evaluation, 97110-Therapeutic exercises, 97530- Therapeutic activity, 97112- Neuromuscular re-education, 97535- Self Care, 54098- Manual therapy, Patient/Family education, Balance training, Stair training, Taping, Dry Needling, Joint mobilization, Joint manipulation, Spinal manipulation, Spinal mobilization, Cryotherapy, and Moist heat.  PLAN FOR NEXT SESSION: Review HEP and progress PRN, continue with strengthening for hip, knee, and posture, progress to upright and closed chain strengthening, core strengthening   Leah Primus, PT, DPT, LAT, ATC 12/07/23  2:29 PM Phone: [phone] Fax: [phone]

## 2023-12-08 ENCOUNTER — Ambulatory Visit (INDEPENDENT_AMBULATORY_CARE_PROVIDER_SITE_OTHER): Admitting: Otolaryngology

## 2023-12-08 ENCOUNTER — Encounter (INDEPENDENT_AMBULATORY_CARE_PROVIDER_SITE_OTHER): Payer: Self-pay | Admitting: Otolaryngology

## 2023-12-08 VITALS — BP 113/75 | HR 109 | Ht 60.0 in

## 2023-12-08 DIAGNOSIS — H903 Sensorineural hearing loss, bilateral: Secondary | ICD-10-CM

## 2023-12-08 DIAGNOSIS — H9313 Tinnitus, bilateral: Secondary | ICD-10-CM

## 2023-12-08 NOTE — Progress Notes (Signed)
 Dear Dr. Nicolette Barrio, Here is my assessment for our mutual patient, [name]. Thank you for allowing me the opportunity to care for your patient. Please do not hesitate to contact me should you have any other questions. Sincerely, Dr. Milon Aloe  Otolaryngology Clinic Note Referring provider: Dr. Nicolette Barrio HPI:  [name] is a 78 y.o. female kindly referred by Dr. Nicolette Barrio for evaluation of bilateral hearing loss and tinnitus  Initial visit (11/2023): Patient reports: bilateral tinnitus for over a year, prior was intermittent. Prior seen by Dr. Tellis Feathers. Intermittent popping and feeling stopped up, some hearing loss. She did have an audio in March 2025. Does not bother her when she sleeps. When there are distractions, does not notice tinnitus as much. No recent medication changes, no significant neck pain. Not on ASA. No significant noise exposure. No antecedent event. Perhaps hearing declining some.  Patient denies: ear pain, fullness, vertigo, drainage, Patient additionally denies: deep pain in ear canal, eustachian tube symptoms such as consistent popping/crackling, sensitive to pressure changes Patient also denies barotrauma, vestibular suppressant use, ototoxic medication use Prior ear surgery: no  H&N Surgery: no Personal or FHx of bleeding dz or anesthesia difficulty: no  AP/AC: no  Tobacco: no  PMHx: HTN, CAD, GERD, PreDM  Independent Review of Additional Tests or Records:  CT Head 2019 independently interpreted with respect to ears :mastoids and ME well aerated; cuts thick so suboptimal eval but no ossicular chain or otic capsule abnormalities noted Dr. Rochelle Chu (09/14/2023): year long h/o tinnitus, hearing loss noted; Dx: SNHL, Tinnitus; Rx: ref to ENT and audio Dr. Tellis Feathers 09/10/2022: noted tinnitus, px since Oct 2023. Suspect related to BP medication; noted b/l SNHL, WRT 84% AU; Dx: Tinnitus and SNHL; Rx: masking, consider HA Bert Britain (AuD) 08/2023 audio reviewed: A/A tymps;  mild to moderate sensorineural hearing loss, bilaterally. Speech Recognition Thresholds were obtained at 40 dB HL in the right ear and at 35 dB HL in the left ear. Word Recognition Testing was completed at 70 dB HL and [name] scored 88% in the right ear and 100% in the left ear.     PMH/Meds/All/SocHx/FamHx/ROS:   Past Medical History:  Diagnosis Date   Arthritis    KNEES   Blood transfusion without reported diagnosis    Cataract    CTS (carpal tunnel syndrome)    BILATERAL LEFT > RIGHT (OCC. HAND NUMBNESS)   Female cystocele    GERD (gastroesophageal reflux disease)    H/O hiatal hernia    History of cervical dysplasia    CIN II   History of peritonitis    2006--  S/P PERFORATED BOWEL INJURY DURING SURGERY   Hyperlipidemia    Hypertension      Past Surgical History:  Procedure Laterality Date   ABDOMINAL HYSTERECTOMY  1991   W/ BILATERAL SALPINGOOPHORECTOMY   BREAST BIOPSY Right 2014   BREAST BIOPSY Left 11/15/2023   US  LT BREAST BX W LOC DEV 1ST LESION IMG BX SPEC US  GUIDE 11/15/2023 [facility]MOGRAP[facility]   LAPAROSCOPIC CHOLECYSTECTOMY  2006   W/ PERFERATED BOWEL (INJURY)   NASAL SEPTUM SURGERY  70's   REPAIR DEVIATED SEPTUM   sepsis after gallbladder surgery     TUBAL LIGATION  1980'S   VAGINAL PROLAPSE REPAIR N/A 08/15/2012   Procedure: VAGINAL VAULT SUSPENSION;  Surgeon: Edmund Gouge, MD;  Location: [facility] [place];  Service: Urology;  Laterality: N/A;  ANTERIOR VAULT REPAIR WITH XENFORE    Family History  Problem Relation Age of Onset  Coronary artery disease Mother    Other Mother        cva   Hypertension Mother    Heart disease Mother    Stroke Father    Breast cancer Sister    Breast cancer Sister    Pancreatic cancer Brother    Aneurysm Brother        abdominal    Prostate cancer Brother    Kidney cancer Son    Diabetes Other    Coronary artery disease Other    Hypertension Other    Stroke Other    Cancer Other        breast    Colon cancer Neg Hx    Esophageal cancer Neg Hx    Stomach cancer Neg Hx    Liver disease Neg Hx      Social Connections: Moderately Integrated (07/05/2023)   Social Connection and Isolation Panel    Frequency of Communication with Friends and Family: More than three times a week    Frequency of Social Gatherings with Friends and Family: More than three times a week    Attends Religious Services: More than 4 times per year    Active Member of [employer] or Organizations: Yes    Attends Banker Meetings: Never    Marital Status: Widowed      Current Outpatient Medications:    alum & mag hydroxide-simeth (MAALOX/MYLANTA) 200-200-20 MG/5ML suspension, Take 15 mLs by mouth as needed for indigestion or heartburn., Disp: , Rfl:    esomeprazole  (NEXIUM ) 40 MG capsule, Take 1 capsule (40 mg total) by mouth daily., Disp: 90 capsule, Rfl: 3   estradiol  (ESTRACE ) 0.1 MG/GM vaginal cream, Place 1 Applicatorful vaginally 3 (three) times a week., Disp: 42.5 g, Rfl: 0   famotidine  (PEPCID ) 20 MG tablet, Take 1 tablet (20 mg total) by mouth 2 (two) times daily. (Patient taking differently: Take 20 mg by mouth as needed for heartburn or indigestion (Patient takes it as needed).), Disp: 180 tablet, Rfl: 3   fluticasone  (FLONASE ) 50 MCG/ACT nasal spray, Place 2 sprays into both nostrils daily. (Patient taking differently: Place 2 sprays into both nostrils as needed for allergies.), Disp: 48 mL, Rfl: 3   loratadine  (CLARITIN ) 10 MG tablet, Take 1 tablet (10 mg total) by mouth daily., Disp: 30 tablet, Rfl: 1   Multiple Vitamin (MULTIVITAMIN) tablet, Take 1 tablet by mouth 3 (three) times a week., Disp: , Rfl:    Probiotic Product (PROBIOTIC-10 ULTIMATE) CAPS, Take by mouth daily at 6 (six) AM., Disp: , Rfl:    psyllium (METAMUCIL) 58.6 % packet, Take 1 packet by mouth daily., Disp: , Rfl:    rosuvastatin  (CRESTOR ) 20 MG tablet, Take 1 tablet (20 mg total) by mouth 4 (four) times a week. TAKE 1 TABLET BY  MOUTH FOUR TIMES A WEEK, Disp: 48 tablet, Rfl: 3   tiZANidine  (ZANAFLEX ) 2 MG tablet, Take 1-2 tablets (2-4 mg total) by mouth every 8 (eight) hours as needed., Disp: 60 tablet, Rfl: 1   torsemide  (DEMADEX ) 10 MG tablet, Take 1 tablet (10 mg total) by mouth daily., Disp: 90 tablet, Rfl: 0   VITAMIN D PO, Take 1 tablet by mouth daily., Disp: , Rfl:    Wheat Dextrin (BENEFIBER DRINK MIX) PACK, Take by mouth as needed., Disp: , Rfl:    Physical Exam:   BP 113/75 (BP Location: Right Arm, Patient Position: Sitting, Cuff Size: Normal)   Pulse (!) 109   Ht 5' (1.524 m)  SpO2 93%   BMI 26.76 kg/m   Salient findings:  CN II-XII intact Given history and complaints, ear microscopy was indicated and performed for evaluation with findings as below in physical exam section and in procedures;  Bilateral EAC clear and TM intact with well pneumatized middle ear spaces Weber 512: mid Rinne 512: AC > BC b/l  Anterior rhinoscopy: Septum intact; no purulence No lesions of oral cavity/oropharynx No respiratory distress or stridor  Seprately Identifiable Procedures:  Prior to initiating any procedures, risks/benefits/alternatives were explained to the patient and verbal consent obtained. Procedure: Bilateral ear microscopy using microscope (CPT [id number]) Pre-procedure diagnosis: bilateral hearing loss and tinnitus  Post-procedure diagnosis: same Indication: see above; given patient's otologic complaints and history, for improved and comprehensive examination of external ear and tympanic membrane, bilateral otologic examination using microscope was performed. Prior to proceeding, verbal consent was obtained after discussion of R/B/A  Procedure: Patient was placed semi-recumbent. Both ear canals were examined using the microscope with findings above. Patient tolerated the procedure well.    Impression & Plans:  Avacyn Kloosterman is a 78 y.o. female with:  1. Bilateral tinnitus   2. Sensorineural hearing loss  (SNHL) of both ears    Worsening hearing subjectively with b/l tinnitus. 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 We discussed options including observation, HA, tinnitus retraining, flavonoids and masking.  Will trial flavonoids and will consider HA. Encouraged masking at night if bothersome. D/w pt f/u, advised to call us  should hearing worsen or other sx develop  See below regarding exact medications prescribed this encounter including dosages and route: No orders of the defined types were placed in this encounter.     Thank you for allowing me the opportunity to care for your patient. Please do not hesitate to contact me should you have any other questions.  Sincerely, Milon Aloe, MD Otolaryngologist (ENT), [facility] Phone: [phone] Fax: [phone]  12/12/2023, 9:57 PM   MDM:  Level 3 - [phone] Complexity/Problems addressed: mod - chronic worsening prb Data complexity: mod - independent review of notes, test; independent CT interpretation - Morbidity: low  - Prescription Drug prescribed or managed: no

## 2023-12-14 ENCOUNTER — Other Ambulatory Visit: Payer: Self-pay

## 2023-12-14 ENCOUNTER — Ambulatory Visit (INDEPENDENT_AMBULATORY_CARE_PROVIDER_SITE_OTHER): Admitting: Physical Therapy

## 2023-12-14 ENCOUNTER — Encounter: Payer: Self-pay | Admitting: Physical Therapy

## 2023-12-14 DIAGNOSIS — G8929 Other chronic pain: Secondary | ICD-10-CM

## 2023-12-14 DIAGNOSIS — M25551 Pain in right hip: Secondary | ICD-10-CM

## 2023-12-14 DIAGNOSIS — M25561 Pain in right knee: Secondary | ICD-10-CM

## 2023-12-14 DIAGNOSIS — M6281 Muscle weakness (generalized): Secondary | ICD-10-CM | POA: Diagnosis not present

## 2023-12-14 NOTE — Therapy (Signed)
 OUTPATIENT PHYSICAL THERAPY TREATMENT   Patient Name: [name] MRN: [id number] DOB:[birth year], 78 y.o., female Today's Date: 12/14/2023   END OF SESSION:  PT End of Session - 12/14/23 1058     Visit Number 3    Number of Visits 9    Date for PT Re-Evaluation 01/19/24    Authorization Type [employer] MCR    Authorization Time Period 11/24/2023 - 01/19/2024    Authorization - Visit Number 3    Authorization - Number of Visits 8    Progress Note Due on Visit 10    PT Start Time 1059    PT Stop Time 1140    PT Time Calculation (min) 41 min    Activity Tolerance Patient tolerated treatment well    Behavior During Therapy WFL for tasks assessed/performed            Past Medical History:  Diagnosis Date   Arthritis    KNEES   Blood transfusion without reported diagnosis    Cataract    CTS (carpal tunnel syndrome)    BILATERAL LEFT > RIGHT (OCC. HAND NUMBNESS)   Female cystocele    GERD (gastroesophageal reflux disease)    H/O hiatal hernia    History of cervical dysplasia    CIN II   History of peritonitis    2006--  S/P PERFORATED BOWEL INJURY DURING SURGERY   Hyperlipidemia    Hypertension    Past Surgical History:  Procedure Laterality Date   ABDOMINAL HYSTERECTOMY  1991   W/ BILATERAL SALPINGOOPHORECTOMY   BREAST BIOPSY Right 2014   BREAST BIOPSY Left 11/15/2023   US  LT BREAST BX W LOC DEV 1ST LESION IMG BX SPEC US  GUIDE 11/15/2023 [facility] MAMMOGRAPHY   LAPAROSCOPIC CHOLECYSTECTOMY  2006   W/ PERFERATED BOWEL (INJURY)   NASAL SEPTUM SURGERY  70's   REPAIR DEVIATED SEPTUM   sepsis after gallbladder surgery     TUBAL LIGATION  1980'S   VAGINAL PROLAPSE REPAIR N/A 08/15/2012   Procedure: VAGINAL VAULT SUSPENSION;  Surgeon: Edmund Gouge, MD;  Location: [facility] [place];  Service: Urology;  Laterality: N/A;  ANTERIOR VAULT REPAIR WITH XENFORE   Patient Active Problem List   Diagnosis Date Noted   Chronic pain of right knee 11/03/2023   Chronic  right-sided low back pain without sciatica 11/03/2023   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 09/15/2023   Sensorineural hearing loss (SNHL) of both ears 09/14/2023   Dyslipidemia, goal LDL below 70 09/14/2023   Prediabetes 09/14/2023   Chronic idiopathic constipation 09/14/2023   Primary osteoarthritis of right knee 07/15/2023   Coronary artery disease involving native coronary artery of native heart without angina pectoris 07/10/2022   Tinnitus, bilateral 04/28/2022   Statin myopathy 03/11/2022   Arthritis of left acromioclavicular joint 04/04/2019   Rotator cuff arthropathy of left shoulder 03/24/2017   Essential hypertension 10/03/2014   Impingement syndrome of left shoulder region 10/03/2014   Routine general medical examination at a health care facility 08/27/2014   GERD 03/17/2009    PCP: Arcadio Knuckles, MD  REFERRING PROVIDER: Syliva Even, MD  REFERRING DIAG: Chronic pain of right knee; Chronic right-sided low back pain without sciatica  Rationale for Evaluation and Treatment: Rehabilitation  THERAPY DIAG:  Pain in right hip  Chronic pain of right knee  Muscle weakness (generalized)  ONSET DATE: Chronic (referral date 11/03/2023)   SUBJECTIVE:          SUBJECTIVE STATEMENT: Patient reports she is tired today because  she was not able to rest much over the weekend.   Eval: Patient reports she has had a lot of knee pain, mainly in the right knee, and also having pain in the right hip region. She was prescribed a muscle relaxer that has been helping some for the right hip. She has had several cortisone shots in the right knee which has helped some. She denies any pain at the moment but did have a little pain in the right hip this morning. The pain can grab her when she is walking or moving around, and it can be very intense, and sometimes she has trouble getting up from a chair. Sometimes she will have some back pain if she is standing for long periods, feels tired.   PERTINENT  HISTORY:  See PMH above  PAIN:  Are you having pain? Yes:  NPRS scale: 0/10 currently, 10/10 at worse Pain location: Right knee and hip Pain description: Sharp, grabbing Aggravating factors: Moving about, making a wrong turn Relieving factors: Medication, rest  PRECAUTIONS: None  PATIENT GOALS: Pain relief with her walking and activity   OBJECTIVE:  Note: Objective measures were completed at Evaluation unless otherwise noted. PATIENT SURVEYS:  PSFS: 5 Walking: 5 Getting up from chair: 5  MUSCLE LENGTH: Not assessed  POSTURE:   Rounded shoulder and forward head posture, increased thoracic kyphosis  PALPATION: Mildly tender to palpation right gluteal region  LUMBAR ROM:   AROM eval  Flexion   Extension   Right lateral flexion   Left lateral flexion   Right rotation   Left rotation    (Blank rows = not tested)  LOWER EXTREMITY ROM:     Active  Right eval Left eval  Hip flexion    Hip extension    Hip abduction    Hip adduction    Hip internal rotation    Hip external rotation    Knee flexion    Knee extension    Ankle dorsiflexion    Ankle plantarflexion    Ankle inversion    Ankle eversion     (Blank rows = not tested)  LOWER EXTREMITY MMT:    MMT Right eval Left eval  Hip flexion 3+ 4-  Hip extension 3- 3  Hip abduction 3 3+  Hip adduction    Hip internal rotation    Hip external rotation    Knee flexion 4 4  Knee extension 4 4  Ankle dorsiflexion    Ankle plantarflexion    Ankle inversion    Ankle eversion     (Blank rows = not tested)  FUNCTIONAL TESTS:  5 times sit to stand: 20 seconds  12/14/2023: 18 secomds  GAIT: Assistive device utilized: None Level of assistance: Complete Independence Comments: Slightly antalgic on right   TREATMENT  [facility] Adult PT Treatment:                                                DATE: 12/14/2023 Recumbent bike L3 x 5 min to improve endurance and workload capacity Bridge 3 x 10 SLR 3 x 10  each Side clamshell 3 x 10 each LAQ with 5# 3 x 10 each Sit to stand 3 x 10 Standing hip abduction and extension 2 x 10 each Standing wall slide for shoulder elevation and thoracic extension 2 x 10 Row with green 3 x 10  PATIENT EDUCATION:  Education details: HEP update Person educated: Patient Education method: Explanation, Demonstration, Tactile cues, Verbal cues, Handout Education comprehension: verbalized understanding, returned demonstration, verbal cues required, tactile cues required, and needs further education  HOME EXERCISE PROGRAM: Access Code: [email or web address]   ASSESSMENT: CLINICAL IMPRESSION: Patient tolerated therapy well with no adverse effects. Therapy continues to focus on progressing her hip and LE strengthening with addition of some postural exercises with good tolerance. She does exhibit improvement in sit to stands but remains limited with her strength overall. Updated her HEP to progress hip strengthening for home. Patient would benefit from continued skilled PT to progress mobility and strength in order to reduce pain and maximize functional ability.   Eval: Patient is a 78 y.o. female who was seen today for physical therapy evaluation and treatment for chronic right knee and right hip pain. Her primary impairment seems to be strength deficit of the hips and knees likely contributing to her pain with activity and decreased tolerance for walking and weight bearing tasks.   OBJECTIVE IMPAIRMENTS: decreased activity tolerance, decreased strength, postural dysfunction, and pain.   ACTIVITY LIMITATIONS: carrying, lifting, standing, squatting, stairs, and locomotion level  PARTICIPATION LIMITATIONS: meal prep, cleaning, shopping, and community activity  PERSONAL FACTORS: Fitness, Past/current experiences, and Time since onset of injury/illness/exacerbation are also affecting patient's functional outcome.    GOALS: Goals reviewed with patient? Yes  SHORT TERM GOALS:  Target date: 12/22/2023  Patient will be I with initial HEP in order to progress with therapy. Baseline: HEP provided at eval Goal status: INITIAL  2.  Patient will report pain with activity </= 6/10 in order to reduce functional limitations  Baseline: patient reports 10/10 pain at worse with activity Goal status: INITIAL  LONG TERM GOALS: Target date: 01/19/2024  Patient will be I with final HEP to maintain progress from PT. Baseline: HEP provided at eval Goal status: INITIAL  2.  Patient will report PSFS >/= 7 in order to indicate an improvement in her functional status Baseline: 5 Goal status: INITIAL  3.  Patient will demonstrate bilateral hip strength >/= 4/5 MMT in order to improve her walking and activity tolerance Baseline: see limitations above Goal status: INITIAL  4.  Patient will demonstrate bilateral knee strength 5/5 MMT in order to improve ability to stand from a chair Baseline: see limitations above Goal status: INITIAL  5. Patient will report 5xSTS </= 12 seconds to indicate improved LE strength and mobility   Baseline: 20 seconds  Goal status: INITIAL   PLAN: PT FREQUENCY: 1x/week  PT DURATION: 8 weeks  PLANNED INTERVENTIONS: 97164- PT Re-evaluation, 97110-Therapeutic exercises, 97530- Therapeutic activity, 97112- Neuromuscular re-education, 97535- Self Care, 54098- Manual therapy, Patient/Family education, Balance training, Stair training, Taping, Dry Needling, Joint mobilization, Joint manipulation, Spinal manipulation, Spinal mobilization, Cryotherapy, and Moist heat.  PLAN FOR NEXT SESSION: Review HEP and progress PRN, continue with strengthening for hip, knee, and posture, progress to upright and closed chain strengthening, core strengthening   Leah Primus, PT, DPT, LAT, ATC 12/14/23  11:45 AM Phone: [phone] Fax: [phone]

## 2023-12-14 NOTE — Patient Instructions (Signed)
 Access Code: [email or web address] URL: [email or web address] Date: 12/14/2023 Prepared by: Leah Primus  Exercises - Active Straight Leg Raise with Quad Set  - 1 x daily - 3 sets - 10 reps - Bridge  - 1 x daily - 3 sets - 10 reps - Sit to Stand  - 1 x daily - 3 sets - 10 reps - Standing Hip Abduction with Counter Support  - 1 x daily - 3 sets - 10 reps - Standing Hip Extension with Counter Support  - 1 x daily - 3 sets - 10 reps - Standing Row with Anchored Resistance  - 1 x daily - 3 sets - 10 reps

## 2023-12-15 ENCOUNTER — Ambulatory Visit: Admitting: Family Medicine

## 2023-12-15 ENCOUNTER — Other Ambulatory Visit: Payer: Self-pay

## 2023-12-15 VITALS — BP 134/84 | HR 78 | Ht 60.0 in | Wt 131.0 lb

## 2023-12-15 DIAGNOSIS — G8929 Other chronic pain: Secondary | ICD-10-CM | POA: Diagnosis not present

## 2023-12-15 DIAGNOSIS — M25512 Pain in left shoulder: Secondary | ICD-10-CM

## 2023-12-15 NOTE — Patient Instructions (Addendum)
 Thank you for coming in today.   You received an injection today. Seek immediate medical attention if the joint becomes red, extremely painful, or is oozing fluid.   I've added your left shoulder to the treatment plan for [name] to work on.  Check back as needed

## 2023-12-15 NOTE — Progress Notes (Signed)
   Joanna Muck, PhD, LAT, ATC acting as a scribe for Garlan Juniper, MD.  [name] is a 78 y.o. female who presents to [facility] today for f/u R knee and low back pain. Pt was last seen by Dr. Alease Hunter on 11/03/23 and was given a R knee steroid injection. Pt also referred to PT, completing 3 visits, and later prescribed tizanidine .  Today, pt reports low back and L hip pain is much improved. She notes L shoulder pain starting Sunday morning. She notes hx of RC tear. Only needing tizanidine  intermittently.   Dx imaging: 11/09/23 R hip & l-spine XR 12/24/22 R knee XR   Pertinent review of systems: No fevers or chills  Relevant historical information: Hypertension.   Exam:  BP 134/84   Pulse 78   Ht 5' (1.524 m)   Wt 131 lb (59.4 kg)   SpO2 96%   BMI 25.58 kg/m  General: Well Developed, well nourished, and in no acute distress.   MSK: Left shoulder: Normal-appearing Range of motion abduction limited.  Otherwise intact. Strength mildly limited abduction.  Otherwise intact. Positive Hawkins and Neer's test. Negative Yergason's and speeds test. Positive empty can test.    Lab and Radiology Results  Procedure: Real-time Ultrasound Guided Injection of left shoulder subacromial bursa Device: Philips Affiniti 50G/GE Logiq Images permanently stored and available for review in PACS Verbal informed consent obtained.  Discussed risks and benefits of procedure. Warned about infection, bleeding, hyperglycemia damage to structures among others. Patient expresses understanding and agreement Time-out conducted.   Noted no overlying erythema, induration, or other signs of local infection.   Skin prepped in a sterile fashion.   Local anesthesia: Topical Ethyl chloride.   With sterile technique and under real time ultrasound guidance: 40 mg of Kenalog and 2 mL of Marcaine  injected into subacromial bursa. Fluid seen entering the bursa.   Completed without  difficulty   Pain immediately resolved suggesting accurate placement of the medication.   Advised to call if fevers/chills, erythema, induration, drainage, or persistent bleeding.   Images permanently stored and available for review in the ultrasound unit.  Impression: Technically successful ultrasound guided injection.       Assessment and Plan: 78 y.o. female with left shoulder pain due to subacromial impingement and bursitis.  Plan for subacromial injection today and continued physical therapy dedicated to this issue.  Her previous problems back and hip pain that she is currently seeing physical therapy for are improving.  We can shift the focus to her left shoulder.  Check back with me as needed.   PDMP not reviewed this encounter. Orders Placed This Encounter  Procedures   US  LIMITED JOINT SPACE STRUCTURES UP LEFT(NO LINKED CHARGES)    Reason for Exam (SYMPTOM  OR DIAGNOSIS REQUIRED):   left shoulder pain    Preferred imaging location?:   [facility] referral to Physical Therapy    Referral Priority:   Routine    Referral Type:   Physical Medicine    Referral Reason:   Specialty Services Required    Requested Specialty:   Physical Therapy    Number of Visits Requested:   1   No orders of the defined types were placed in this encounter.    Discussed warning signs or symptoms. Please see discharge instructions. Patient expresses understanding.   The above documentation has been reviewed and is accurate and complete Garlan Juniper, M.D.

## 2023-12-21 ENCOUNTER — Other Ambulatory Visit: Payer: Self-pay

## 2023-12-21 ENCOUNTER — Ambulatory Visit (INDEPENDENT_AMBULATORY_CARE_PROVIDER_SITE_OTHER): Admitting: Physical Therapy

## 2023-12-21 ENCOUNTER — Encounter: Payer: Self-pay | Admitting: Physical Therapy

## 2023-12-21 DIAGNOSIS — G8929 Other chronic pain: Secondary | ICD-10-CM | POA: Diagnosis not present

## 2023-12-21 DIAGNOSIS — M6281 Muscle weakness (generalized): Secondary | ICD-10-CM

## 2023-12-21 DIAGNOSIS — M25512 Pain in left shoulder: Secondary | ICD-10-CM

## 2023-12-21 DIAGNOSIS — M25561 Pain in right knee: Secondary | ICD-10-CM | POA: Diagnosis not present

## 2023-12-21 DIAGNOSIS — M25551 Pain in right hip: Secondary | ICD-10-CM | POA: Diagnosis not present

## 2023-12-21 NOTE — Therapy (Signed)
 OUTPATIENT PHYSICAL THERAPY TREATMENT   Patient Name: [name] MRN: [id number] DOB:[birth year], 78 y.o., female Today's Date: 12/21/2023   END OF SESSION:  PT End of Session - 12/21/23 1109     Visit Number 4    Number of Visits 9    Date for PT Re-Evaluation 01/19/24    Authorization Type [employer] MCR    Authorization Time Period 11/24/2023 - 01/19/2024    Authorization - Visit Number 4    Authorization - Number of Visits 8    Progress Note Due on Visit 10    PT Start Time 1100    PT Stop Time 1145    PT Time Calculation (min) 45 min    Activity Tolerance Patient tolerated treatment well    Behavior During Therapy WFL for tasks assessed/performed             Past Medical History:  Diagnosis Date   Arthritis    KNEES   Blood transfusion without reported diagnosis    Cataract    CTS (carpal tunnel syndrome)    BILATERAL LEFT > RIGHT (OCC. HAND NUMBNESS)   Female cystocele    GERD (gastroesophageal reflux disease)    H/O hiatal hernia    History of cervical dysplasia    CIN II   History of peritonitis    2006--  S/P PERFORATED BOWEL INJURY DURING SURGERY   Hyperlipidemia    Hypertension    Past Surgical History:  Procedure Laterality Date   ABDOMINAL HYSTERECTOMY  1991   W/ BILATERAL SALPINGOOPHORECTOMY   BREAST BIOPSY Right 2014   BREAST BIOPSY Left 11/15/2023   US  LT BREAST BX W LOC DEV 1ST LESION IMG BX SPEC US  GUIDE 11/15/2023 [facility]   LAPAROSCOPIC CHOLECYSTECTOMY  2006   W/ PERFERATED BOWEL (INJURY)   NASAL SEPTUM SURGERY  70's   REPAIR DEVIATED SEPTUM   sepsis after gallbladder surgery     TUBAL LIGATION  1980'S   VAGINAL PROLAPSE REPAIR N/A 08/15/2012   Procedure: VAGINAL VAULT SUSPENSION;  Surgeon: Arlena LILLETTE Gal, MD;  Location: [facility];  Service: Urology;  Laterality: N/A;  ANTERIOR VAULT REPAIR WITH XENFORE   Patient Active Problem List   Diagnosis Date Noted   Chronic pain of right knee 11/03/2023    Chronic right-sided low back pain without sciatica 11/03/2023   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 09/15/2023   Sensorineural hearing loss (SNHL) of both ears 09/14/2023   Dyslipidemia, goal LDL below 70 09/14/2023   Prediabetes 09/14/2023   Chronic idiopathic constipation 09/14/2023   Primary osteoarthritis of right knee 07/15/2023   Coronary artery disease involving native coronary artery of native heart without angina pectoris 07/10/2022   Tinnitus, bilateral 04/28/2022   Statin myopathy 03/11/2022   Arthritis of left acromioclavicular joint 04/04/2019   Rotator cuff arthropathy of left shoulder 03/24/2017   Essential hypertension 10/03/2014   Impingement syndrome of left shoulder region 10/03/2014   Routine general medical examination at a health care facility 08/27/2014   GERD 03/17/2009    PCP: Joshua Debby LITTIE, MD  REFERRING PROVIDER: Joane Artist RAMAN, MD  REFERRING DIAG: Chronic pain of right knee; Chronic right-sided low back pain without sciatica  Rationale for Evaluation and Treatment: Rehabilitation  THERAPY DIAG:  Chronic left shoulder pain  Pain in right hip  Chronic pain of right knee  Muscle weakness (generalized)  ONSET DATE: Chronic (referral date 11/03/2023)   SUBJECTIVE:          SUBJECTIVE STATEMENT: Patient  reports she saw the doctor and had a shot in her left shoulder. She had some pain in the shoulder but it's not bothering her now.   Eval: Patient reports she has had a lot of knee pain, mainly in the right knee, and also having pain in the right hip region. She was prescribed a muscle relaxer that has been helping some for the right hip. She has had several cortisone shots in the right knee which has helped some. She denies any pain at the moment but did have a little pain in the right hip this morning. The pain can grab her when she is walking or moving around, and it can be very intense, and sometimes she has trouble getting up from a chair. Sometimes she will  have some back pain if she is standing for long periods, feels tired.   PERTINENT HISTORY:  See PMH above  PAIN:  Are you having pain? Yes:  NPRS scale: 0/10 currently, 5/10 at worse Pain location: Right knee and hip Pain description: Sharp, grabbing Aggravating factors: Moving about, making a wrong turn Relieving factors: Medication, rest  NPRS scale: 0/10 currently, 10/10 at worse Pain location: Left shoulder Pain description: Sharp Aggravating factors: Moving left shoulder, hanging clothes Relieving factors: Medication, rest  PRECAUTIONS: None  PATIENT GOALS: Pain relief with her walking and activity   OBJECTIVE:  Note: Objective measures were completed at Evaluation unless otherwise noted. PATIENT SURVEYS:  PSFS: 5 Walking: 5 Getting up from chair: 5  12/21/2023:  PSFS: 8 Walking: 7 Getting up from chair: 9  QuickDASH: 50% disability  POSTURE:  Rounded shoulder and forward head posture, increased thoracic kyphosis  PALPATION: Mildly tender to palpation right gluteal region 12/21/2023: tender to periacromial region, posterior cuff muscles  LOWER EXTREMITY MMT:    MMT Right eval Left eval  Hip flexion 3+ 4-  Hip extension 3- 3  Hip abduction 3 3+  Hip adduction    Hip internal rotation    Hip external rotation    Knee flexion 4 4  Knee extension 4 4  Ankle dorsiflexion    Ankle plantarflexion    Ankle inversion    Ankle eversion     (Blank rows = not tested)  UPPER EXTREMITY ROM:  Active ROM Right 12/21/2023 Left 12/21/2023  Shoulder flexion 135 125  Shoulder extension    Shoulder abduction    Shoulder adduction    Shoulder extension    Shoulder internal rotation T8 L1  Shoulder external rotation T4 T1  Elbow flexion    Elbow extension    Wrist flexion    Wrist extension    Wrist ulnar deviation    Wrist radial deviation    Wrist pronation    Wrist supination     (Blank rows = not tested)  UPPER EXTREMITY MMT:  MMT Right 12/21/2023  Left 12/21/2023  Shoulder flexion 5 4  Shoulder extension    Shoulder abduction    Shoulder adduction    Shoulder extension    Shoulder internal rotation 5 5  Shoulder external rotation 5 4  Middle trapezius    Lower trapezius    Elbow flexion    Elbow extension    Wrist flexion    Wrist extension    Wrist ulnar deviation    Wrist radial deviation    Wrist pronation    Wrist supination    Grip strength     (Blank rows = not tested)   FUNCTIONAL TESTS:  5 times sit  to stand: 20 seconds  12/14/2023: 18 seconds  GAIT: Assistive device utilized: None Level of assistance: Complete Independence Comments: Slightly antalgic on right   TREATMENT  [facility] Adult PT Treatment:                                                DATE: 12/21/2023 UBE L1 x 4 min (fwd/bwd) to improve endurance and workload capacity Row with green 3 x 10 Standing shoulder ER with red x 10 Sidelying shoulder ER x 10, with 2# 2 x 10 Sidelying shoulder abduction with 2# 2 x 10 Seated scaption with 2# 3 x 10 Standing wall slide for shoulder elevation 2 x 10  PATIENT EDUCATION:  Education details: HEP update Person educated: Patient Education method: Explanation, Demonstration, Tactile cues, Verbal cues, Handout Education comprehension: verbalized understanding, returned demonstration, verbal cues required, tactile cues required, and needs further education  HOME EXERCISE PROGRAM: Access Code: [email or web address]   ASSESSMENT: CLINICAL IMPRESSION: Patient tolerated therapy well with no adverse effects. She arrives with new referral for the left shoulder so assessed shoulder and therapy focused primarily on exercises to improve shoulder motion and strengthening the left rotator cuff. She does exhibit limitation with her left shoulder motion and strength indicative of a rotator cuff pathology. Updated her HEP to include shoulder strengthening and stretching for home. Patient would benefit from continued skilled PT to  progress mobility and strength in order to reduce pain and maximize functional ability.   Eval: Patient is a 78 y.o. female who was seen today for physical therapy evaluation and treatment for chronic right knee and right hip pain. Her primary impairment seems to be strength deficit of the hips and knees likely contributing to her pain with activity and decreased tolerance for walking and weight bearing tasks.   OBJECTIVE IMPAIRMENTS: decreased activity tolerance, decreased strength, postural dysfunction, and pain.   ACTIVITY LIMITATIONS: carrying, lifting, standing, squatting, stairs, and locomotion level  PARTICIPATION LIMITATIONS: meal prep, cleaning, shopping, and community activity  PERSONAL FACTORS: Fitness, Past/current experiences, and Time since onset of injury/illness/exacerbation are also affecting patient's functional outcome.    GOALS: Goals reviewed with patient? Yes  SHORT TERM GOALS: Target date: 12/22/2023  Patient will be I with initial HEP in order to progress with therapy. Baseline: HEP provided at eval Goal status: MET  2.  Patient will report pain with activity </= 6/10 in order to reduce functional limitations  Baseline: patient reports 10/10 pain at worse with activity 12/22/2023: reports left shoulder pain 10/10 at worst Goal status: ONGOING  LONG TERM GOALS: Target date: 01/19/2024  Patient will be I with final HEP to maintain progress from PT. Baseline: HEP provided at eval 12/21/2023: progressing Goal status: ONGOING  2.  Patient will report PSFS >/= 7 in order to indicate an improvement in her functional status Baseline: 5 12/21/2023: 8 Goal status: MET  3.  Patient will demonstrate bilateral hip strength >/= 4/5 MMT in order to improve her walking and activity tolerance Baseline: see limitations above Goal status: INITIAL  4.  Patient will demonstrate bilateral knee strength 5/5 MMT in order to improve ability to stand from a chair Baseline: see  limitations above Goal status: INITIAL  5. Patient will report 5xSTS </= 12 seconds to indicate improved LE strength and mobility   Baseline: 20 seconds 12/14/2023: 18 seconds  Goal status: ONGOING  6. Patient will report QuickDASH </= 20% in order to indicate an improvement in their functional status  Baseline: 50% disability  Goal status: INITIAL   PLAN: PT FREQUENCY: 1x/week  PT DURATION: 8 weeks  PLANNED INTERVENTIONS: 97164- PT Re-evaluation, 97110-Therapeutic exercises, 97530- Therapeutic activity, 97112- Neuromuscular re-education, 97535- Self Care, 02859- Manual therapy, Patient/Family education, Balance training, Stair training, Taping, Dry Needling, Joint mobilization, Joint manipulation, Spinal manipulation, Spinal mobilization, Cryotherapy, and Moist heat.  PLAN FOR NEXT SESSION: Review HEP and progress PRN, work on left shoulder mobility and strengthening, continue with strengthening for hip, knee, and posture, progress to upright and closed chain strengthening, core strengthening   Elaine Daring, PT, DPT, LAT, ATC 12/21/23  12:47 PM Phone: [phone] Fax: [phone]

## 2023-12-21 NOTE — Patient Instructions (Signed)
 Access Code: [email or web address] URL: [email or web address] Date: 12/21/2023 Prepared by: Elaine Daring  Exercises - Active Straight Leg Raise with Quad Set  - 1 x daily - 3 sets - 10 reps - Bridge  - 1 x daily - 3 sets - 10 reps - Sit to Stand  - 1 x daily - 3 sets - 10 reps - Standing Hip Abduction with Counter Support  - 1 x daily - 3 sets - 10 reps - Standing Hip Extension with Counter Support  - 1 x daily - 3 sets - 10 reps - Standing Row with Anchored Resistance  - 1 x daily - 3 sets - 10 reps - Sidelying Shoulder External Rotation Dumbbell  - 1 x daily - 3 sets - 10 reps - Single Arm Scaption with Dumbbell  - 1 x daily - 3 sets - 10 reps - Standing shoulder flexion wall slides  - 1 x daily - 3 sets - 10 reps

## 2023-12-28 ENCOUNTER — Other Ambulatory Visit: Payer: Self-pay

## 2023-12-28 ENCOUNTER — Ambulatory Visit (INDEPENDENT_AMBULATORY_CARE_PROVIDER_SITE_OTHER): Admitting: Physical Therapy

## 2023-12-28 ENCOUNTER — Encounter: Payer: Self-pay | Admitting: Physical Therapy

## 2023-12-28 DIAGNOSIS — M25512 Pain in left shoulder: Secondary | ICD-10-CM

## 2023-12-28 DIAGNOSIS — M25551 Pain in right hip: Secondary | ICD-10-CM | POA: Diagnosis not present

## 2023-12-28 DIAGNOSIS — M6281 Muscle weakness (generalized): Secondary | ICD-10-CM | POA: Diagnosis not present

## 2023-12-28 DIAGNOSIS — G8929 Other chronic pain: Secondary | ICD-10-CM

## 2023-12-28 DIAGNOSIS — M25561 Pain in right knee: Secondary | ICD-10-CM

## 2023-12-28 NOTE — Therapy (Signed)
 OUTPATIENT PHYSICAL THERAPY TREATMENT   Patient Name: [name] MRN: [id number] DOB:[birth year], 78 y.o., female Today's Date: 12/28/2023   END OF SESSION:  PT End of Session - 12/28/23 1116     Visit Number 5    Number of Visits 9    Date for PT Re-Evaluation 01/19/24    Authorization Type [employer] MCR    Authorization Time Period 11/24/2023 - 01/19/2024    Authorization - Visit Number 5    Authorization - Number of Visits 8    Progress Note Due on Visit 10    PT Start Time 1102    PT Stop Time 1145    PT Time Calculation (min) 43 min    Activity Tolerance Patient tolerated treatment well    Behavior During Therapy WFL for tasks assessed/performed              Past Medical History:  Diagnosis Date   Arthritis    KNEES   Blood transfusion without reported diagnosis    Cataract    CTS (carpal tunnel syndrome)    BILATERAL LEFT > RIGHT (OCC. HAND NUMBNESS)   Female cystocele    GERD (gastroesophageal reflux disease)    H/O hiatal hernia    History of cervical dysplasia    CIN II   History of peritonitis    2006--  S/P PERFORATED BOWEL INJURY DURING SURGERY   Hyperlipidemia    Hypertension    Past Surgical History:  Procedure Laterality Date   ABDOMINAL HYSTERECTOMY  1991   W/ BILATERAL SALPINGOOPHORECTOMY   BREAST BIOPSY Right 2014   BREAST BIOPSY Left 11/15/2023   US  LT BREAST BX W LOC DEV 1ST LESION IMG BX SPEC US  GUIDE 11/15/2023 [facility]   LAPAROSCOPIC CHOLECYSTECTOMY  2006   W/ PERFERATED BOWEL (INJURY)   NASAL SEPTUM SURGERY  70's   REPAIR DEVIATED SEPTUM   sepsis after gallbladder surgery     TUBAL LIGATION  1980'S   VAGINAL PROLAPSE REPAIR N/A 08/15/2012   Procedure: VAGINAL VAULT SUSPENSION;  Surgeon: Arlena LILLETTE Gal, MD;  Location: [facility];  Service: Urology;  Laterality: N/A;  ANTERIOR VAULT REPAIR WITH XENFORE   Patient Active Problem List   Diagnosis Date Noted   Chronic pain of right knee 11/03/2023    Chronic right-sided low back pain without sciatica 11/03/2023   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 09/15/2023   Sensorineural hearing loss (SNHL) of both ears 09/14/2023   Dyslipidemia, goal LDL below 70 09/14/2023   Prediabetes 09/14/2023   Chronic idiopathic constipation 09/14/2023   Primary osteoarthritis of right knee 07/15/2023   Coronary artery disease involving native coronary artery of native heart without angina pectoris 07/10/2022   Tinnitus, bilateral 04/28/2022   Statin myopathy 03/11/2022   Arthritis of left acromioclavicular joint 04/04/2019   Rotator cuff arthropathy of left shoulder 03/24/2017   Essential hypertension 10/03/2014   Impingement syndrome of left shoulder region 10/03/2014   Routine general medical examination at a health care facility 08/27/2014   GERD 03/17/2009    PCP: Joshua Debby LITTIE, MD  REFERRING PROVIDER: Joane Artist RAMAN, MD  REFERRING DIAG: Chronic pain of right knee; Chronic right-sided low back pain without sciatica  Rationale for Evaluation and Treatment: Rehabilitation  THERAPY DIAG:  Chronic left shoulder pain  Pain in right hip  Chronic pain of right knee  Muscle weakness (generalized)  ONSET DATE: Chronic (referral date 11/03/2023)   SUBJECTIVE:          SUBJECTIVE STATEMENT:  Patient states her shoulder is feeling better. She did have some pain in the right knee.  Eval: Patient reports she has had a lot of knee pain, mainly in the right knee, and also having pain in the right hip region. She was prescribed a muscle relaxer that has been helping some for the right hip. She has had several cortisone shots in the right knee which has helped some. She denies any pain at the moment but did have a little pain in the right hip this morning. The pain can grab her when she is walking or moving around, and it can be very intense, and sometimes she has trouble getting up from a chair. Sometimes she will have some back pain if she is standing for long  periods, feels tired.   PERTINENT HISTORY:  See PMH above  PAIN:  Are you having pain? Yes:  NPRS scale: 0/10 currently, 5/10 at worse Pain location: Right knee and hip Pain description: Sharp, grabbing Aggravating factors: Moving about, making a wrong turn Relieving factors: Medication, rest  NPRS scale: 0/10 currently, 10/10 at worse Pain location: Left shoulder Pain description: Sharp Aggravating factors: Moving left shoulder, hanging clothes Relieving factors: Medication, rest  PRECAUTIONS: None  PATIENT GOALS: Pain relief with her walking and activity   OBJECTIVE:  Note: Objective measures were completed at Evaluation unless otherwise noted. PATIENT SURVEYS:  PSFS: 5 Walking: 5 Getting up from chair: 5  12/21/2023:  PSFS: 8 Walking: 7 Getting up from chair: 9  QuickDASH: 50% disability  POSTURE:  Rounded shoulder and forward head posture, increased thoracic kyphosis  PALPATION: Mildly tender to palpation right gluteal region 12/21/2023: tender to periacromial region, posterior cuff muscles  LOWER EXTREMITY MMT:    MMT Right eval Left eval  Hip flexion 3+ 4-  Hip extension 3- 3  Hip abduction 3 3+  Hip adduction    Hip internal rotation    Hip external rotation    Knee flexion 4 4  Knee extension 4 4  Ankle dorsiflexion    Ankle plantarflexion    Ankle inversion    Ankle eversion     (Blank rows = not tested)  UPPER EXTREMITY ROM:  Active ROM Right 12/21/2023 Left 12/21/2023 Left 12/28/2023  Shoulder flexion 135 125 135  Shoulder extension     Shoulder abduction     Shoulder adduction     Shoulder extension     Shoulder internal rotation T8 L1   Shoulder external rotation T4 T1   Elbow flexion     Elbow extension     Wrist flexion     Wrist extension     Wrist ulnar deviation     Wrist radial deviation     Wrist pronation     Wrist supination      (Blank rows = not tested)  UPPER EXTREMITY MMT:  MMT Right 12/21/2023  Left 12/21/2023  Shoulder flexion 5 4  Shoulder extension    Shoulder abduction    Shoulder adduction    Shoulder extension    Shoulder internal rotation 5 5  Shoulder external rotation 5 4  Middle trapezius    Lower trapezius    Elbow flexion    Elbow extension    Wrist flexion    Wrist extension    Wrist ulnar deviation    Wrist radial deviation    Wrist pronation    Wrist supination    Grip strength     (Blank rows = not tested)  FUNCTIONAL TESTS:  5 times sit to stand: 20 seconds  12/14/2023: 18 seconds  GAIT: Assistive device utilized: None Level of assistance: Complete Independence Comments: Slightly antalgic on right   TREATMENT  [facility] Adult PT Treatment:                                                DATE: 12/28/2023 Total body exerciser (UBE and recumbent bike) L3 x 5 min to improve endurance and workload capacity SLR 2 x 10 each Bridge 2 x 10 LAQ with 5# 2 x 10 each Sit to stand 2 x 10 Standing wall slide for shoulder elevation 2 x 10 Row with blue 2 x 10 Standing heel raises 2 x 10 Standing hip abduction with green at knees 2 x 10 Sidelying shoulder ER x 10, with 2# 2 x 10 Sidelying shoulder abduction with 2# 2 x 10 Seated scaption with 2# 2 x 10  PATIENT EDUCATION:  Education details: HEP Person educated: Patient Education method: Programmer, multimedia, Demonstration, Actor cues, Verbal cues Education comprehension: verbalized understanding, returned demonstration, verbal cues required, tactile cues required, and needs further education  HOME EXERCISE PROGRAM: Access Code: [email or web address]   ASSESSMENT: CLINICAL IMPRESSION: Patient tolerated therapy well with no adverse effects. Therapy focused on strength for her LE and shoulders with good tolerance. She does exhibit improved left shoulder active elevation this visit. No pain reported with right knee or left shoulder and she reports she can tell she is getting stronger with stuff she does around the home. No  changes made to HEP this visit. Patient would benefit from continued skilled PT to progress mobility and strength in order to reduce pain and maximize functional ability.   Eval: Patient is a 78 y.o. female who was seen today for physical therapy evaluation and treatment for chronic right knee and right hip pain. Her primary impairment seems to be strength deficit of the hips and knees likely contributing to her pain with activity and decreased tolerance for walking and weight bearing tasks.   OBJECTIVE IMPAIRMENTS: decreased activity tolerance, decreased strength, postural dysfunction, and pain.   ACTIVITY LIMITATIONS: carrying, lifting, standing, squatting, stairs, and locomotion level  PARTICIPATION LIMITATIONS: meal prep, cleaning, shopping, and community activity  PERSONAL FACTORS: Fitness, Past/current experiences, and Time since onset of injury/illness/exacerbation are also affecting patient's functional outcome.    GOALS: Goals reviewed with patient? Yes  SHORT TERM GOALS: Target date: 12/22/2023  Patient will be I with initial HEP in order to progress with therapy. Baseline: HEP provided at eval Goal status: MET  2.  Patient will report pain with activity </= 6/10 in order to reduce functional limitations  Baseline: patient reports 10/10 pain at worse with activity 12/22/2023: reports left shoulder pain 10/10 at worst Goal status: ONGOING  LONG TERM GOALS: Target date: 01/19/2024  Patient will be I with final HEP to maintain progress from PT. Baseline: HEP provided at eval 12/21/2023: progressing Goal status: ONGOING  2.  Patient will report PSFS >/= 7 in order to indicate an improvement in her functional status Baseline: 5 12/21/2023: 8 Goal status: MET  3.  Patient will demonstrate bilateral hip strength >/= 4/5 MMT in order to improve her walking and activity tolerance Baseline: see limitations above Goal status: INITIAL  4.  Patient will demonstrate bilateral knee  strength 5/5 MMT in order to improve ability to  stand from a chair Baseline: see limitations above Goal status: INITIAL  5. Patient will report 5xSTS </= 12 seconds to indicate improved LE strength and mobility   Baseline: 20 seconds 12/14/2023: 18 seconds  Goal status: ONGOING  6. Patient will report QuickDASH </= 20% in order to indicate an improvement in their functional status  Baseline: 50% disability  Goal status: INITIAL   PLAN: PT FREQUENCY: 1x/week  PT DURATION: 8 weeks  PLANNED INTERVENTIONS: 97164- PT Re-evaluation, 97110-Therapeutic exercises, 97530- Therapeutic activity, 97112- Neuromuscular re-education, 97535- Self Care, 02859- Manual therapy, Patient/Family education, Balance training, Stair training, Taping, Dry Needling, Joint mobilization, Joint manipulation, Spinal manipulation, Spinal mobilization, Cryotherapy, and Moist heat.  PLAN FOR NEXT SESSION: Review HEP and progress PRN, work on left shoulder mobility and strengthening, continue with strengthening for hip, knee, and posture, progress to upright and closed chain strengthening, core strengthening   Elaine Daring, PT, DPT, LAT, ATC 12/28/23  11:54 AM Phone: [phone] Fax: [phone]

## 2024-01-06 ENCOUNTER — Ambulatory Visit: Payer: Self-pay

## 2024-01-06 NOTE — Telephone Encounter (Signed)
 FYI Only or Action Required?: FYI only for provider.  Patient was last seen in primary care on 10/11/2023 by Lendia Boby CROME, NP-C.  Called Nurse Triage reporting Medication Reaction.  Symptoms began about a month ago.  Symptoms are: gradually worsening.  Triage Disposition: See PCP When Office is Open (Within 3 Days)  Patient/caregiver understands and will follow disposition?: Yes     Copied from CRM [id number]. Topic: Clinical - Medication Question >> Jan 06, 2024  2:59 PM Frederich PARAS wrote: Reason for CRM: pt leaving a note  to adv that the torsemide  10mg  tablet she is having side effects from the medicine, losing weight, atleast 3 pound, effecting vc mainly n the evenings, vc sounds a little horse, dont feel it as much when she drink water , itching under arm     Reason for Disposition  Prescription request for new medicine (not a refill)  Answer Assessment - Initial Assessment Questions 1. NAME of MEDICINE: What medicine(s) are you calling about?     Torsemide   2. QUESTION: What is your question? (e.g., double dose of medicine, side effect)     Side effects 3. PRESCRIBER: Who prescribed the medicine? Reason: if prescribed by specialist, call should be referred to that group.     Dr. Joshua  4. SYMPTOMS: Do you have any symptoms? If Yes, ask: What symptoms are you having?  How bad are the symptoms (e.g., mild, moderate, severe)     Weight loss, hoarse voice, itchiness under arms  Protocols used: Medication Question Call-A-AH

## 2024-01-07 ENCOUNTER — Ambulatory Visit (INDEPENDENT_AMBULATORY_CARE_PROVIDER_SITE_OTHER): Admitting: Internal Medicine

## 2024-01-07 VITALS — BP 142/90 | HR 75 | Temp 98.3°F | Ht 60.0 in | Wt 131.2 lb

## 2024-01-07 DIAGNOSIS — J309 Allergic rhinitis, unspecified: Secondary | ICD-10-CM | POA: Diagnosis not present

## 2024-01-07 DIAGNOSIS — R490 Dysphonia: Secondary | ICD-10-CM | POA: Diagnosis not present

## 2024-01-07 DIAGNOSIS — I10 Essential (primary) hypertension: Secondary | ICD-10-CM

## 2024-01-07 MED ORDER — PREDNISONE 10 MG PO TABS: ORAL_TABLET | ORAL | 0 refills | Status: DC

## 2024-01-07 NOTE — Progress Notes (Unsigned)
 Patient ID: [name], female   DOB: [birth year], 78 y.o.   MRN: [id number]        Chief Complaint: follow up voice hoarseness, allergies, htn, and recent torsemide  start       HPI:  [name] is a 78 y.o. female here with c/o 1 mo worsening allergy symtpoms - Does have several wks ongoing nasal allergy symptoms with clearish congestion, itch and sneezing, without fever, pain, ST, cough, swelling or wheezing.  Does have also mild but persistent voice hoarseness more recent onset.  Lost wt 3-4 lbs.  She is very concerned that the torsemide  is causing her hoarseness.  Pt  notes she has not taken her BP med yest this AM in the rush to get here.         Wt Readings from Last 3 Encounters:  01/07/24 131 lb 3.2 oz (59.5 kg)  12/15/23 131 lb (59.4 kg)  11/03/23 137 lb (62.1 kg)   BP Readings from Last 3 Encounters:  01/07/24 (!) 142/90  12/15/23 134/84  12/08/23 113/75         Past Medical History:  Diagnosis Date   Arthritis    KNEES   Blood transfusion without reported diagnosis    Cataract    CTS (carpal tunnel syndrome)    BILATERAL LEFT > RIGHT (OCC. HAND NUMBNESS)   Female cystocele    GERD (gastroesophageal reflux disease)    H/O hiatal hernia    History of cervical dysplasia    CIN II   History of peritonitis    2006--  S/P PERFORATED BOWEL INJURY DURING SURGERY   Hyperlipidemia    Hypertension    Past Surgical History:  Procedure Laterality Date   ABDOMINAL HYSTERECTOMY  1991   W/ BILATERAL SALPINGOOPHORECTOMY   BREAST BIOPSY Right 2014   BREAST BIOPSY Left 11/15/2023   US  LT BREAST BX W LOC DEV 1ST LESION IMG BX SPEC US  GUIDE 11/15/2023 [facility]   LAPAROSCOPIC CHOLECYSTECTOMY  2006   W/ PERFERATED BOWEL (INJURY)   NASAL SEPTUM SURGERY  70's   REPAIR DEVIATED SEPTUM   sepsis after gallbladder surgery     TUBAL LIGATION  1980'S   VAGINAL PROLAPSE REPAIR N/A 08/15/2012   Procedure: VAGINAL VAULT SUSPENSION;  Surgeon: Arlena LILLETTE Gal, MD;   Location: [facility];  Service: Urology;  Laterality: N/A;  ANTERIOR VAULT REPAIR WITH XENFORE    reports that she has never smoked. She has never used smokeless tobacco. She reports that she does not drink alcohol and does not use drugs. family history includes Aneurysm in her brother; Breast cancer in her sister and sister; Cancer in an other family member; Coronary artery disease in her mother and another family member; Diabetes in an other family member; Heart disease in her mother; Hypertension in her mother and another family member; Kidney cancer in her son; Other in her mother; Pancreatic cancer in her brother; Prostate cancer in her brother; Stroke in her father and another family member. Allergies  Allergen Reactions   Zocor  [Simvastatin ]     12/2013 CK 528   Advil [Ibuprofen] Swelling    Tongue swells   Bran [Wheat] Other (See Comments)    Very bad GI pain   Hydrocodone  Nausea Only and Other (See Comments)     TACHYCARDIA   Hydrocodone -Acetaminophen  Nausea Only    Other reaction(s): Increased Heart Rate (intolerance)   Metoprolol  Tartrate Tinitus   Hydrochlorothiazide  Other (See Comments)    Elevated calcium   Lipitor [Atorvastatin] Other (See Comments)    Bad dreams   Pravachol [Pravastatin Sodium] Other (See Comments)    Bad dreams   Current Outpatient Medications on File Prior to Visit  Medication Sig Dispense Refill   alum & mag hydroxide-simeth (MAALOX/MYLANTA) 200-200-20 MG/5ML suspension Take 15 mLs by mouth as needed for indigestion or heartburn.     esomeprazole  (NEXIUM ) 40 MG capsule Take 1 capsule (40 mg total) by mouth daily. 90 capsule 3   estradiol  (ESTRACE ) 0.1 MG/GM vaginal cream Place 1 Applicatorful vaginally 3 (three) times a week. 42.5 g 0   famotidine  (PEPCID ) 20 MG tablet Take 1 tablet (20 mg total) by mouth 2 (two) times daily. (Patient taking differently: Take 20 mg by mouth as needed for heartburn or indigestion (Patient takes it as  needed).) 180 tablet 3   fluticasone  (FLONASE ) 50 MCG/ACT nasal spray Place 2 sprays into both nostrils daily. (Patient taking differently: Place 2 sprays into both nostrils as needed for allergies.) 48 mL 3   loratadine  (CLARITIN ) 10 MG tablet Take 1 tablet (10 mg total) by mouth daily. 30 tablet 1   Multiple Vitamin (MULTIVITAMIN) tablet Take 1 tablet by mouth 3 (three) times a week.     Probiotic Product (PROBIOTIC-10 ULTIMATE) CAPS Take by mouth daily at 6 (six) AM.     psyllium (METAMUCIL) 58.6 % packet Take 1 packet by mouth daily.     rosuvastatin  (CRESTOR ) 20 MG tablet Take 1 tablet (20 mg total) by mouth 4 (four) times a week. TAKE 1 TABLET BY MOUTH FOUR TIMES A WEEK 48 tablet 3   tiZANidine  (ZANAFLEX ) 2 MG tablet Take 1-2 tablets (2-4 mg total) by mouth every 8 (eight) hours as needed. 60 tablet 1   torsemide  (DEMADEX ) 10 MG tablet Take 1 tablet (10 mg total) by mouth daily. 90 tablet 0   VITAMIN D PO Take 1 tablet by mouth daily.     Wheat Dextrin (BENEFIBER DRINK MIX) PACK Take by mouth as needed.     No current facility-administered medications on file prior to visit.        ROS:  All others reviewed and negative.  Objective        PE:  BP (!) 142/90   Pulse 75   Temp 98.3 F (36.8 C)   Ht 5' (1.524 m)   Wt 131 lb 3.2 oz (59.5 kg)   SpO2 98%   BMI 25.62 kg/m                 Constitutional: Pt appears in NAD               HENT: Head: NCAT.                Right Ear: External ear normal.                 Left Ear: External ear normal. Bilat tm's with mild erythema.  Max sinus areas non tender.  Pharynx with mild erythema, no exudate               Eyes: . Pupils are equal, round, and reactive to light. Conjunctivae and EOM are normal               Nose: without d/c or deformity               Neck: Neck supple. Gross normal ROM               Cardiovascular: Normal  rate and regular rhythm.                 Pulmonary/Chest: Effort normal and breath sounds without rales or  wheezing.                Abd:  Soft, NT, ND, + BS, no organomegaly               Neurological: Pt is alert. At baseline orientation, motor grossly intact               Skin: Skin is warm. No rashes, no other new lesions, LE edema - none               Psychiatric: Pt behavior is normal without agitation   Micro: none  Cardiac tracings I have personally interpreted today:  none  Pertinent Radiological findings (summarize): none   Lab Results  Component Value Date   WBC 5.2 09/14/2023   HGB 13.7 09/14/2023   HCT 41.1 09/14/2023   PLT 297.0 09/14/2023   GLUCOSE 86 09/14/2023   CHOL 174 11/11/2023   TRIG 76 11/11/2023   HDL 68 11/11/2023   LDLDIRECT 124 (H) 07/10/2022   LDLCALC 92 11/11/2023   ALT 30 11/11/2023   AST 25 09/14/2023   NA 140 09/14/2023   K 3.7 09/14/2023   CL 98 09/14/2023   CREATININE 0.71 09/14/2023   BUN 13 09/14/2023   CO2 37 (H) 09/14/2023   TSH 3.22 09/14/2023   HGBA1C 6.2 09/14/2023   Assessment/Plan:  [name] is a 78 y.o. Black or African American [2] female with  has a past medical history of Arthritis, Blood transfusion without reported diagnosis, Cataract, CTS (carpal tunnel syndrome), Female cystocele, GERD (gastroesophageal reflux disease), H/O hiatal hernia, History of cervical dysplasia, History of peritonitis, Hyperlipidemia, and Hypertension.  Essential hypertension BP Readings from Last 3 Encounters:  01/07/24 (!) 142/90  12/15/23 134/84  12/08/23 113/75   Uncontrolled but pt states not yet taken am meds, pt to continue medical treatment demadex  10 mg every day as declines other change ; declines f/u bmp today    Allergic rhinitis Mild to mod, for prednisone  taper, change claritin  to zyrtec 10 every day, and restart flonase  otc she has at home,  to f/u any worsening symptoms or concerns  Hoarseness Mild persistent, possibly related to allergic post nasal gtt?  Pt declines ENT referral for now but may reconsider if not improved  with tx  Followup: Return if symptoms worsen or fail to improve.  Lynwood Rush, MD 01/08/2024 5:37 PM [facility] - [facility]

## 2024-01-07 NOTE — Patient Instructions (Signed)
 Please take all new medication as prescribed - the prednisone   Ok to change the claritin  to OTC Zyrtec 10 mg daily as needed  Please continue all other medications as before, including the restart of flonase .  Please have the pharmacy call with any other refills you may need.  Please keep your appointments with your specialists as you may have planned  Let us  know if you change your mind about the referral to ENT for the hoarseness.

## 2024-01-08 ENCOUNTER — Encounter: Payer: Self-pay | Admitting: Internal Medicine

## 2024-01-08 DIAGNOSIS — J309 Allergic rhinitis, unspecified: Secondary | ICD-10-CM | POA: Insufficient documentation

## 2024-01-08 DIAGNOSIS — R490 Dysphonia: Secondary | ICD-10-CM | POA: Insufficient documentation

## 2024-01-08 NOTE — Assessment & Plan Note (Signed)
 Mild to mod, for prednisone  taper, change claritin  to zyrtec 10 every day, and restart flonase  otc she has at home,  to f/u any worsening symptoms or concerns

## 2024-01-08 NOTE — Assessment & Plan Note (Signed)
 Mild persistent, possibly related to allergic post nasal gtt?  Pt declines ENT referral for now but may reconsider if not improved with tx

## 2024-01-08 NOTE — Assessment & Plan Note (Addendum)
 BP Readings from Last 3 Encounters:  01/07/24 (!) 142/90  12/15/23 134/84  12/08/23 113/75   Uncontrolled but pt states not yet taken am meds, pt to continue medical treatment demadex  10 mg every day as declines other change ; declines f/u bmp today

## 2024-01-10 ENCOUNTER — Ambulatory Visit: Payer: Self-pay

## 2024-01-10 ENCOUNTER — Other Ambulatory Visit (HOSPITAL_COMMUNITY): Payer: Self-pay

## 2024-01-10 ENCOUNTER — Ambulatory Visit: Attending: Cardiovascular Disease | Admitting: Pharmacist

## 2024-01-10 ENCOUNTER — Encounter: Payer: Self-pay | Admitting: Pharmacist

## 2024-01-10 DIAGNOSIS — E785 Hyperlipidemia, unspecified: Secondary | ICD-10-CM

## 2024-01-10 MED ORDER — ROSUVASTATIN CALCIUM 20 MG PO TABS
20.0000 mg | ORAL_TABLET | Freq: Every day | ORAL | 3 refills | Status: DC
  Filled 2024-01-10 (×2): qty 90, 90d supply, fill #0

## 2024-01-10 NOTE — Progress Notes (Signed)
 Patient ID: [name]                 DOB: 08-17-1945                    MRN: [id number]      HPI: [name] is a 78 y.o. female patient referred to lipid clinic by Dr.Chandrasekhar. PMH is significant for hypertension, CAD, GERD, statin myopathy, HLD, prediabetes.   The patient presented today for lipid clinic follow-up. She reports tolerating rosuvastatin  four times a week well without side effects. She has been unable to tolerate other statins due to nightmares. In the past, simvastatin  was associated with elevated CK levels 9 years ago so statin were held. Will get baseline CK level baseline CK level either today or tomorrow.   We reviewed options for further lowering LDL cholesterol, including ezetimibe , PCSK9 inhibitors, bempedoic acid, and inclisiran. The mechanisms of action, dosing schedules, side effects, and potential LDL reductions were discussed. Cost considerations and patient assistance programs were also reviewed.  The patient expressed a preference to avoid injectable therapies and would rather try rosuvastatin  20 mg daily. She agreed to have liver function tests (LFTs) and lipid panels checked in one month. She is comfortable starting any oral medications over injections if needed. Current Medications: rosuvastatin  20 mg 3 ties per week  Intolerances: pravastatin and atorvastatin, simvastatin  - nightmares CK elevation,Zetia  - legs weakness  Risk Factors: hypertension, CAD,  statin myopathy, HLD LDL goal: <70 mg/dl  Last lab on 94/84/7974 TC 174, TG 76, HDL 68, LDL 92   Diet: fried - once every 2 weeks, eats out once a month.  Eats lot of vegetables and fruit  Snack: cookies and chips   Exercise: PT - for arthritis twice a week session     Family History:  Relation Problem Comments  Mother (Deceased at age 88) Coronary artery disease   Heart disease   Hypertension   Other Cerebrovascular accident at age 24     Father (Deceased at age 13) Stroke at age 28       Sister (Deceased) Breast cancer     Sister Metallurgist) Breast cancer     Brother (Deceased) Aneurysm abdominal  Pancreatic cancer     Brother Prostate cancer     Daughter Metallurgist)   Other - family hx of Cancer breast  Coronary artery disease   Diabetes   Hypertension   Stroke     Son Metallurgist)   Son (Alive) Kidney cancer     Social History:  Alcohol: none  Smoking : none  Labs:  Lipid Panel     Component Value Date/Time   CHOL 174 11/11/2023 1144   CHOL 272 (H) 05/22/2014 0956   TRIG 76 11/11/2023 1144   TRIG 111 05/22/2014 0956   HDL 68 11/11/2023 1144   HDL 67 05/22/2014 0956   CHOLHDL 2.6 11/11/2023 1144   CHOLHDL 3 09/14/2023 1438   VLDL 28.2 09/14/2023 1438   LDLCALC 92 11/11/2023 1144   LDLCALC 183 (H) 05/22/2014 0956   LDLDIRECT 124 (H) 07/10/2022 1049   LDLDIRECT 175.3 04/24/2008 0848   LABVLDL 14 11/11/2023 1144    Past Medical History:  Diagnosis Date   Arthritis    KNEES   Blood transfusion without reported diagnosis    Cataract    CTS (carpal tunnel syndrome)    BILATERAL LEFT > RIGHT (OCC. HAND NUMBNESS)   Female cystocele    GERD (gastroesophageal reflux disease)  H/O hiatal hernia    History of cervical dysplasia    CIN II   History of peritonitis    2006--  S/P PERFORATED BOWEL INJURY DURING SURGERY   Hyperlipidemia    Hypertension     Current Outpatient Medications on File Prior to Visit  Medication Sig Dispense Refill   alum & mag hydroxide-simeth (MAALOX/MYLANTA) 200-200-20 MG/5ML suspension Take 15 mLs by mouth as needed for indigestion or heartburn.     esomeprazole  (NEXIUM ) 40 MG capsule Take 1 capsule (40 mg total) by mouth daily. 90 capsule 3   estradiol  (ESTRACE ) 0.1 MG/GM vaginal cream Place 1 Applicatorful vaginally 3 (three) times a week. 42.5 g 0   famotidine  (PEPCID ) 20 MG tablet Take 1 tablet (20 mg total) by mouth 2 (two) times daily. 180 tablet 3   fluticasone  (FLONASE ) 50 MCG/ACT nasal spray Place 2 sprays into both  nostrils daily. 48 mL 3   loratadine  (CLARITIN ) 10 MG tablet Take 1 tablet (10 mg total) by mouth daily. 30 tablet 1   Multiple Vitamin (MULTIVITAMIN) tablet Take 1 tablet by mouth 3 (three) times a week.     predniSONE  (DELTASONE ) 10 MG tablet 3 tabs by mouth per day for 3 days,2tabs per day for 3 days,1tab per day for 3 days 18 tablet 0   Probiotic Product (PROBIOTIC-10 ULTIMATE) CAPS Take by mouth daily at 6 (six) AM.     psyllium (METAMUCIL) 58.6 % packet Take 1 packet by mouth daily.     tiZANidine  (ZANAFLEX ) 2 MG tablet Take 1-2 tablets (2-4 mg total) by mouth every 8 (eight) hours as needed. 60 tablet 1   torsemide  (DEMADEX ) 10 MG tablet Take 1 tablet (10 mg total) by mouth daily. 90 tablet 0   VITAMIN D PO Take 1 tablet by mouth daily.     Wheat Dextrin (BENEFIBER DRINK MIX) PACK Take by mouth as needed.     No current facility-administered medications on file prior to visit.    Allergies  Allergen Reactions   Zocor  [Simvastatin ]     12/2013 CK 528   Advil [Ibuprofen] Swelling    Tongue swells   Bran [Wheat] Other (See Comments)    Very bad GI pain   Hydrocodone  Nausea Only and Other (See Comments)     TACHYCARDIA   Hydrocodone -Acetaminophen  Nausea Only    Other reaction(s): Increased Heart Rate (intolerance)   Metoprolol  Tartrate Tinitus   Hydrochlorothiazide  Other (See Comments)    Elevated calcium    Lipitor [Atorvastatin] Other (See Comments)    Bad dreams   Pravachol [Pravastatin Sodium] Other (See Comments)    Bad dreams    Assessment/Plan:  1. Hyperlipidemia -  Problem  Dyslipidemia, Goal Ldl Below 70   Dyslipidemia, goal LDL below 70 Assessment:  LDL goal: <70 mg/dl last LDLc 92 mg/dl (94/84/7974) Tolerates rosuvastatin  20 mg 4 times per week well  without any side effects  Intolerance to other  statins - pravastatin and atorvastatin, simvastatin  - nightmares,LFT  elevation from simvastatin  ,Zetia  - legs weakness  Discussed next potential options (Zetia ,  PCSK-9 inhibitors, bempedoic acid and inclisiran); cost, dosing efficacy, side effects   Plan:  The patient expressed a preference to avoid injectable therapies and would rather try rosuvastatin  20 mg daily. Will get baseline CK level today or tomorrow. She agreed to have liver function tests (LFTs), CK and lipid panels checked in one month. She is comfortable starting any oral medications over injections if needed.   Thank you,  Robbi Blanch, Pharm.D   [name]. [facility] [address], [place], [place] [address] Phone: [phone]; Fax: [phone]

## 2024-01-10 NOTE — Telephone Encounter (Signed)
 FYI Only or Action Required?: Action required by provider: update on patient condition.  Patient was last seen in primary care on 01/07/2024 by Norleen Lynwood ORN, MD.  Called Nurse Triage reporting Medication Problem.  Symptoms began a week ago.  Interventions attempted: Rest, hydration, or home remedies.  Symptoms are: unchanged.  Triage Disposition: Call PCP When Office is Open  Patient/caregiver understands and will follow disposition?: Yes    Copied from CRM [id number]. Topic: Clinical - Medication Question >> Jan 10, 2024  3:36 PM Carlyon D wrote: Reason for CRM: Pt states she is taking torsemide  10 mg, she is concerned to continue taking this due to having to urinate so bad she said when taking this pill she almost does not make it to the toilet and she is concerned and no longer wants to take this medication. She would like a call back. Reason for Disposition  Caller wants to use a complementary or alternative medicine  Answer Assessment - Initial Assessment Questions 1. NAME of MEDICINE: What medicine(s) are you calling about?     torsemide  10 mg 2. QUESTION: What is your question? (e.g., double dose of medicine, side effect)     I am going to the bathroom way too much Endorses losing 3-4# 3. PRESCRIBER: Who prescribed the medicine? Reason: if prescribed by specialist, call should be referred to that group.     Joshua Debby CROME, MD 4. SYMPTOMS: Do you have any symptoms? If Yes, ask: What symptoms are you having?  How bad are the symptoms (e.g., mild, moderate, severe)     Hoarse voice - pt reports recent visit with PCP and was prescribed steroids d/t suspected allergies Urinary frequency/urgency - concerns with dehydration - denies urinary pain, burning, back pain  Of note has F/U appt later this month, but does not want to continue taking medications until F/U appt and would like to discuss other options.  5. PREGNANCY:  Is there any chance that you are pregnant?  When was your last menstrual period?     N/a    Triager will forward encounter for Dr. Joshua 's office to review and advise. Patient verbalized understanding and is expecting call back from office for next steps/if alternative Rx available. Triager also advised pt to continue taking all medications as prescribed until instructed otherwise.  Protocols used: Medication Question Call-A-AH

## 2024-01-10 NOTE — Assessment & Plan Note (Addendum)
 Assessment:  LDL goal: <70 mg/dl last LDLc 92 mg/dl (94/84/7974) Tolerates rosuvastatin  20 mg 4 times per week well  without any side effects  Intolerance to other  statins - pravastatin and atorvastatin, simvastatin  - nightmares,LFT  elevation from simvastatin  ,Zetia  - legs weakness  Discussed next potential options (Zetia , PCSK-9 inhibitors, bempedoic acid and inclisiran); cost, dosing efficacy, side effects   Plan:  The patient expressed a preference to avoid injectable therapies and would rather try rosuvastatin  20 mg daily. Will get baseline CK level today or tomorrow. She agreed to have liver function tests (LFTs), CK and lipid panels checked in one month. She is comfortable starting any oral medications over injections if needed.

## 2024-01-10 NOTE — Patient Instructions (Addendum)
 Your Results:             Your most recent labs Goal  Total Cholesterol 174 < 200  Triglycerides 76 < 150  HDL (happy/good cholesterol) 68 > 40  LDL (lousy/bad cholesterol 92 < 70   Medication changes: Start taking rosuvastatin  20 mg daily. Pick your prescription from pharmacy downstairs.  Will get baseline CK level check today or tomorrow. the future lab due on Feb 21, 2024  If LDL still remain elevated we can consider adding ezetimibe  and/or bempedoic acid

## 2024-01-13 ENCOUNTER — Encounter: Payer: Self-pay | Admitting: Physical Therapy

## 2024-01-13 ENCOUNTER — Other Ambulatory Visit: Payer: Self-pay

## 2024-01-13 ENCOUNTER — Ambulatory Visit (INDEPENDENT_AMBULATORY_CARE_PROVIDER_SITE_OTHER): Admitting: Physical Therapy

## 2024-01-13 DIAGNOSIS — G8929 Other chronic pain: Secondary | ICD-10-CM

## 2024-01-13 DIAGNOSIS — M25561 Pain in right knee: Secondary | ICD-10-CM | POA: Diagnosis not present

## 2024-01-13 DIAGNOSIS — M6281 Muscle weakness (generalized): Secondary | ICD-10-CM

## 2024-01-13 DIAGNOSIS — M25512 Pain in left shoulder: Secondary | ICD-10-CM

## 2024-01-13 DIAGNOSIS — M25551 Pain in right hip: Secondary | ICD-10-CM | POA: Diagnosis not present

## 2024-01-13 NOTE — Therapy (Signed)
 OUTPATIENT PHYSICAL THERAPY TREATMENT  DISCHARGE   Patient Name: [name] MRN: [id number] DOB:02-15-1946, 78 y.o., female Today's Date: 01/13/2024   END OF SESSION:  PT End of Session - 01/13/24 0856     Visit Number 6    Number of Visits 9    Date for PT Re-Evaluation 01/19/24    Authorization Type [employer] M[employer]    Authorization Time Period 11/24/2023 - 01/19/2024    Authorization - Visit Number 6    Authorization - Number of Visits 8    Progress Note Due on Visit 10    PT Start Time 0850    PT Stop Time 0930    PT Time Calculation (min) 40 min    Activity Tolerance Patient tolerated treatment well    Behavior During Therapy WFL for tasks assessed/performed               Past Medical History:  Diagnosis Date   Arthritis    KNEES   Blood transfusion without reported diagnosis    Cataract    CTS (carpal tunnel syndrome)    BILATERAL LEFT > RIGHT (OCC. HAND NUMBNESS)   Female cystocele    GERD (gastroesophageal reflux disease)    H/O hiatal hernia    History of cervical dysplasia    CIN II   History of peritonitis    2006--  S/P PERFORATED BOWEL INJURY DURING SURGERY   Hyperlipidemia    Hypertension    Past Surgical History:  Procedure Laterality Date   ABDOMINAL HYSTERECTOMY  1991   W/ BILATERAL SALPINGOOPHORECTOMY   BREAST BIOPSY Right 2014   BREAST BIOPSY Left 11/15/2023   US  LT BREAST BX W LOC DEV 1ST LESION IMG BX SPEC US  GUIDE 11/15/2023 [facility]   LAPAROSCOPIC CHOLECYSTECTOMY  2006   W/ PERFERATED BOWEL (INJURY)   NASAL SEPTUM SURGERY  70's   REPAIR DEVIATED SEPTUM   sepsis after gallbladder surgery     TUBAL LIGATION  1980'S   VAGINAL PROLAPSE REPAIR N/A 08/15/2012   Procedure: VAGINAL VAULT SUSPENSION;  Surgeon: Arlena LILLETTE Gal, MD;  Location: [facility] [place];  Service: Urology;  Laterality: N/A;  ANTERIOR VAULT REPAIR WITH XENFORE   Patient Active Problem List   Diagnosis Date Noted   Allergic rhinitis 01/08/2024    Hoarseness 01/08/2024   Chronic pain of right knee 11/03/2023   Chronic right-sided low back pain without sciatica 11/03/2023   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 09/15/2023   Sensorineural hearing loss (SNHL) of both ears 09/14/2023   Dyslipidemia, goal LDL below 70 09/14/2023   Prediabetes 09/14/2023   Chronic idiopathic constipation 09/14/2023   Primary osteoarthritis of right knee 07/15/2023   Coronary artery disease involving native coronary artery of native heart without angina pectoris 07/10/2022   Tinnitus, bilateral 04/28/2022   Statin myopathy 03/11/2022   Arthritis of left acromioclavicular joint 04/04/2019   Rotator cuff arthropathy of left shoulder 03/24/2017   Essential hypertension 10/03/2014   Impingement syndrome of left shoulder region 10/03/2014   Routine general medical examination at a health care facility 08/27/2014   GERD 03/17/2009    PCP: Joshua Debby LITTIE, MD  REFERRING PROVIDER: Joane Artist RAMAN, MD  REFERRING DIAG: Chronic pain of right knee; Chronic right-sided low back pain without sciatica  Rationale for Evaluation and Treatment: Rehabilitation  THERAPY DIAG:  Chronic left shoulder pain  Pain in right hip  Chronic pain of right knee  Muscle weakness (generalized)  ONSET DATE: Chronic (referral date 11/03/2023)  SUBJECTIVE:          SUBJECTIVE STATEMENT: Patient states her shoulder is doing well. She is overall better. She did have some mild pain when reaching across with the left arm, and her knee still has some mild pain on occasion but doesn't really bother her too much and she hasn't been taking any pain medication.   Eval: Patient reports she has had a lot of knee pain, mainly in the right knee, and also having pain in the right hip region. She was prescribed a muscle relaxer that has been helping some for the right hip. She has had several cortisone shots in the right knee which has helped some. She denies any pain at the moment but did have a  little pain in the right hip this morning. The pain can grab her when she is walking or moving around, and it can be very intense, and sometimes she has trouble getting up from a chair. Sometimes she will have some back pain if she is standing for long periods, feels tired.   PERTINENT HISTORY:  See PMH above  PAIN:  Are you having pain? Yes:  NPRS scale: 0/10 currently, 4/10 at worst Pain location: Right knee and hip Pain description: Sharp, grabbing Aggravating factors: Moving about, making a wrong turn Relieving factors: Medication, rest  NPRS scale: 0/10 currently, 4/10 at worst Pain location: Left shoulder Pain description: Sharp Aggravating factors: Moving left shoulder, hanging clothes Relieving factors: Medication, rest  PRECAUTIONS: None  PATIENT GOALS: Pain relief with her walking and activity   OBJECTIVE:  Note: Objective measures were completed at Evaluation unless otherwise noted. PATIENT SURVEYS:  PSFS: 5 Walking: 5 Getting up from chair: 5  12/21/2023:  PSFS: 8 Walking: 7 Getting up from chair: 9  QuickDASH: 50% disability  01/13/2024: 22.7%  POSTURE:  Rounded shoulder and forward head posture, increased thoracic kyphosis  PALPATION: Mildly tender to palpation right gluteal region 12/21/2023: tender to periacromial region, posterior cuff muscles  LOWER EXTREMITY MMT:    MMT Right eval Left eval Rt / Lt 01/13/2024  Hip flexion 3+ 4-   Hip extension 3- 3   Hip abduction 3 3+ 4- / 4-  Hip adduction     Hip internal rotation     Hip external rotation     Knee flexion 4 4   Knee extension 4 4 5  / 5  Ankle dorsiflexion     Ankle plantarflexion     Ankle inversion     Ankle eversion      (Blank rows = not tested)  UPPER EXTREMITY ROM:  Active ROM Right 12/21/2023 Left 12/21/2023 Left 12/28/2023 Rt/ Lt 01/13/2024  Shoulder flexion 135 125 135 140 / 140  Shoulder extension      Shoulder abduction      Shoulder adduction      Shoulder extension       Shoulder internal rotation T8 L1    Shoulder external rotation T4 T1    Elbow flexion      Elbow extension      Wrist flexion      Wrist extension      Wrist ulnar deviation      Wrist radial deviation      Wrist pronation      Wrist supination       (Blank rows = not tested)  UPPER EXTREMITY MMT:  MMT Right 12/21/2023 Left 12/21/2023  Shoulder flexion 5 4  Shoulder extension    Shoulder abduction  Shoulder adduction    Shoulder extension    Shoulder internal rotation 5 5  Shoulder external rotation 5 4  Middle trapezius    Lower trapezius    Elbow flexion    Elbow extension    Wrist flexion    Wrist extension    Wrist ulnar deviation    Wrist radial deviation    Wrist pronation    Wrist supination    Grip strength     (Blank rows = not tested)   FUNCTIONAL TESTS:  5 times sit to stand: 20 seconds  12/14/2023: 18 seconds 01/13/2024: 12 seconds  GAIT: Assistive device utilized: None Level of assistance: Complete Independence Comments: Slightly antalgic on right  01/13/2024: grossly [facility]   TREATMENT  [facility] Adult PT Treatment:                                                DATE: 01/13/2024 Total body exerciser (UBE and recumbent bike) L3 x 5 min to improve endurance and workload capacity SLR x 10 each Bridge x 10 Sit to stand 2 x 10 Standing hip abduction and extension 2 x 10 Standing wall slide for shoulder elevation x 10 Row with blue 2 x 10 Sidelying shoulder ER with 2# 2 x 10 Seated scaption with 2# 2 x 10  Discussed continuing with her HEP to maintain mobility mobility and strength, and following-up with her provider as needed  PATIENT EDUCATION:  Education details: POC discharge, HEP Person educated: Patient Education method: Programmer, multimedia, Demonstration Education comprehension: verbalized understanding, returned demonstration  HOME EXERCISE PROGRAM: Access Code: [email or web address]   ASSESSMENT: CLINICAL IMPRESSION: Patient tolerated therapy well with  no adverse effects. She reports great improvement with her left shoulder and right knee. Her functional status has improved as well as her shoulder motion and LE strength. She does report occasional mild pain but reports that this does not affect her. Patient will be formally discharged from PT at this time.   Eval: Patient is a 78 y.o. female who was seen today for physical therapy evaluation and treatment for chronic right knee and right hip pain. Her primary impairment seems to be strength deficit of the hips and knees likely contributing to her pain with activity and decreased tolerance for walking and weight bearing tasks.   OBJECTIVE IMPAIRMENTS: decreased activity tolerance, decreased strength, postural dysfunction, and pain.   ACTIVITY LIMITATIONS: carrying, lifting, standing, squatting, stairs, and locomotion level  PARTICIPATION LIMITATIONS: meal prep, cleaning, shopping, and community activity  PERSONAL FACTORS: Fitness, Past/current experiences, and Time since onset of injury/illness/exacerbation are also affecting patient's functional outcome.    GOALS: Goals reviewed with patient? Yes  SHORT TERM GOALS: Target date: 12/22/2023  Patient will be I with initial HEP in order to progress with therapy. Baseline: HEP provided at eval Goal status: MET  2.  Patient will report pain with activity </= 6/10 in order to reduce functional limitations  Baseline: patient reports 10/10 pain at worse with activity 12/22/2023: reports left shoulder pain 10/10 at worst 01/13/2024: 4/10 pain at worst Goal status: MET  LONG TERM GOALS: Target date: 01/19/2024  Patient will be I with final HEP to maintain progress from PT. Baseline: HEP provided at eval 12/21/2023: progressing 01/13/2024: independent with HEP Goal status: MET  2.  Patient will report PSFS >/= 7 in order to indicate an improvement in her  functional status Baseline: 5 12/21/2023: 8 Goal status: MET  3.  Patient will  demonstrate bilateral hip strength >/= 4/5 MMT in order to improve her walking and activity tolerance Baseline: see limitations above 01/13/2024: grossly 4-/5 MMT Goal status: PARTIALLY MET  4.  Patient will demonstrate bilateral knee strength 5/5 MMT in order to improve ability to stand from a chair Baseline: see limitations above 01/13/2024: 5/5 MMT Goal status: PARTIALLY MET  5. Patient will report 5xSTS </= 12 seconds to indicate improved LE strength and mobility   Baseline: 20 seconds 12/14/2023: 18 seconds 01/13/2024: 12 seconds  Goal status: MET  6. Patient will report QuickDASH </= 20% in order to indicate an improvement in their functional status  Baseline: 50% disability 01/13/2024: 22.7%  Goal status: PARTIALLY MET   PLAN: PT FREQUENCY: 1x/week  PT DURATION: 8 weeks  PLANNED INTERVENTIONS: 97164- PT Re-evaluation, 97110-Therapeutic exercises, 97530- Therapeutic activity, 97112- Neuromuscular re-education, 97535- Self Care, [id number]- Manual therapy, Patient/Family education, Balance training, Stair training, Taping, Dry Needling, Joint mobilization, Joint manipulation, Spinal manipulation, Spinal mobilization, Cryotherapy, and Moist heat.  PLAN FOR NEXT SESSION: NA - discharge   Elaine Daring, PT, DPT, LAT, ATC 01/13/24  9:30 AM Phone: [phone] Fax: [phone]   PHYSICAL THERAPY DISCHARGE SUMMARY  Visits from Start of Care: 6  Current functional level related to goals / functional outcomes: See above   Remaining deficits: See above   Education / Equipment: HEP   Patient agrees to discharge. Patient goals were partially met. Patient is being discharged due to being pleased with the current functional level.

## 2024-01-13 NOTE — Patient Instructions (Signed)
 Access Code: [email or web address] URL: [email or web address] Date: 01/13/2024 Prepared by: Elaine Daring  Exercises - Active Straight Leg Raise with Quad Set  - 1 x daily - 3 sets - 10 reps - Bridge  - 1 x daily - 3 sets - 10 reps - Sit to Stand  - 1 x daily - 3 sets - 10 reps - Standing Hip Abduction with Counter Support  - 1 x daily - 3 sets - 10 reps - Standing Hip Extension with Counter Support  - 1 x daily - 3 sets - 10 reps - Standing Row with Anchored Resistance  - 1 x daily - 3 sets - 10 reps - Sidelying Shoulder External Rotation Dumbbell  - 1 x daily - 3 sets - 10 reps - Single Arm Scaption with Dumbbell  - 1 x daily - 3 sets - 10 reps - Standing shoulder flexion wall slides  - 1 x daily - 3 sets - 10 reps

## 2024-01-21 NOTE — Telephone Encounter (Signed)
**Note De-identified  [place] Obfuscation** Please advise 

## 2024-01-21 NOTE — Telephone Encounter (Signed)
 We can check her lytes next week

## 2024-01-21 NOTE — Telephone Encounter (Signed)
 Patient has been made aware and gave a verbal understanding.

## 2024-01-26 ENCOUNTER — Ambulatory Visit (INDEPENDENT_AMBULATORY_CARE_PROVIDER_SITE_OTHER): Admitting: Internal Medicine

## 2024-01-26 ENCOUNTER — Encounter: Payer: Self-pay | Admitting: Internal Medicine

## 2024-01-26 ENCOUNTER — Ambulatory Visit: Payer: Self-pay | Admitting: Internal Medicine

## 2024-01-26 ENCOUNTER — Ambulatory Visit (INDEPENDENT_AMBULATORY_CARE_PROVIDER_SITE_OTHER): Admitting: Family Medicine

## 2024-01-26 ENCOUNTER — Other Ambulatory Visit: Payer: Self-pay

## 2024-01-26 VITALS — BP 120/74 | HR 78 | Ht 60.0 in | Wt 132.0 lb

## 2024-01-26 VITALS — BP 120/74 | HR 78 | Temp 98.5°F | Ht 60.0 in | Wt 132.0 lb

## 2024-01-26 DIAGNOSIS — I10 Essential (primary) hypertension: Secondary | ICD-10-CM | POA: Diagnosis not present

## 2024-01-26 DIAGNOSIS — E876 Hypokalemia: Secondary | ICD-10-CM | POA: Diagnosis not present

## 2024-01-26 DIAGNOSIS — G8929 Other chronic pain: Secondary | ICD-10-CM | POA: Diagnosis not present

## 2024-01-26 DIAGNOSIS — M1711 Unilateral primary osteoarthritis, right knee: Secondary | ICD-10-CM | POA: Diagnosis not present

## 2024-01-26 DIAGNOSIS — M25561 Pain in right knee: Secondary | ICD-10-CM

## 2024-01-26 DIAGNOSIS — K219 Gastro-esophageal reflux disease without esophagitis: Secondary | ICD-10-CM

## 2024-01-26 DIAGNOSIS — E1122 Type 2 diabetes mellitus with diabetic chronic kidney disease: Secondary | ICD-10-CM | POA: Diagnosis not present

## 2024-01-26 DIAGNOSIS — N1831 Chronic kidney disease, stage 3a: Secondary | ICD-10-CM | POA: Diagnosis not present

## 2024-01-26 DIAGNOSIS — R7303 Prediabetes: Secondary | ICD-10-CM

## 2024-01-26 DIAGNOSIS — T502X5A Adverse effect of carbonic-anhydrase inhibitors, benzothiadiazides and other diuretics, initial encounter: Secondary | ICD-10-CM

## 2024-01-26 LAB — CBC WITH DIFFERENTIAL/PLATELET
Basophils Absolute: 0 K/uL (ref 0.0–0.1)
Basophils Relative: 0.4 % (ref 0.0–3.0)
Eosinophils Absolute: 0.1 K/uL (ref 0.0–0.7)
Eosinophils Relative: 1.1 % (ref 0.0–5.0)
HCT: 39.6 % (ref 36.0–46.0)
Hemoglobin: 13.2 g/dL (ref 12.0–15.0)
Lymphocytes Relative: 35 % (ref 12.0–46.0)
Lymphs Abs: 2 K/uL (ref 0.7–4.0)
MCHC: 33.4 g/dL (ref 30.0–36.0)
MCV: 91.5 fl (ref 78.0–100.0)
Monocytes Absolute: 0.5 K/uL (ref 0.1–1.0)
Monocytes Relative: 8.6 % (ref 3.0–12.0)
Neutro Abs: 3.1 K/uL (ref 1.4–7.7)
Neutrophils Relative %: 54.9 % (ref 43.0–77.0)
Platelets: 264 K/uL (ref 150.0–400.0)
RBC: 4.32 Mil/uL (ref 3.87–5.11)
RDW: 14.1 % (ref 11.5–15.5)
WBC: 5.7 K/uL (ref 4.0–10.5)

## 2024-01-26 LAB — HEMOGLOBIN A1C: Hgb A1c MFr Bld: 6.5 % (ref 4.6–6.5)

## 2024-01-26 LAB — BASIC METABOLIC PANEL WITH GFR
BUN: 14 mg/dL (ref 6–23)
CO2: 37 meq/L — ABNORMAL HIGH (ref 19–32)
Calcium: 9.1 mg/dL (ref 8.4–10.5)
Chloride: 98 meq/L (ref 96–112)
Creatinine, Ser: 0.75 mg/dL (ref 0.40–1.20)
GFR: 76.31 mL/min (ref >60.00)
Glucose, Bld: 97 mg/dL (ref 70–99)
Potassium: 3.1 meq/L — ABNORMAL LOW (ref 3.5–5.1)
Sodium: 142 meq/L (ref 135–145)

## 2024-01-26 MED ORDER — SPIRONOLACTONE 25 MG PO TABS: 25.0000 mg | ORAL_TABLET | Freq: Every day | ORAL | 0 refills | Status: DC

## 2024-01-26 MED ORDER — FAMOTIDINE 20 MG PO TABS: 20.0000 mg | ORAL_TABLET | Freq: Two times a day (BID) | ORAL | 1 refills | Status: AC

## 2024-01-26 MED ORDER — EMPAGLIFLOZIN 10 MG PO TABS: 10.0000 mg | ORAL_TABLET | Freq: Every day | ORAL | 1 refills | Status: DC

## 2024-01-26 MED ORDER — SPIRONOLACTONE 25 MG PO TABS: 25.0000 mg | ORAL_TABLET | Freq: Every day | ORAL | 1 refills | Status: DC

## 2024-01-26 MED ORDER — TORSEMIDE 10 MG PO TABS: 5.0000 mg | ORAL_TABLET | Freq: Every day | ORAL | 1 refills | Status: DC

## 2024-01-26 MED ORDER — ESOMEPRAZOLE MAGNESIUM 40 MG PO CPDR: 40.0000 mg | DELAYED_RELEASE_CAPSULE | Freq: Every day | ORAL | 1 refills | Status: AC

## 2024-01-26 NOTE — Progress Notes (Signed)
 Subjective:  Patient ID: [name], female    DOB: 03-Feb-1946  Age: 78 y.o. MRN: [id number]  CC: Medication Problem (Patient states that she wants to discuss her BP medication. When she was taking the medication she kept running to the bathroom, she has a rash on her lips (not sure if it's from the medicine but she think it is). ), Hypertension, Osteoarthritis, and Gastroesophageal Reflux   HPI [name] presents for f/up ----  Discussed the use of AI scribe software for clinical note transcription with the patient, who gave verbal consent to proceed.  History of Present Illness [name] is a 78 year old female who presents with concerns of dehydration and frequent urination.  She experiences frequent urination and wondered if it could be related to taking both prednisone  and torsemide . She describes excessive bathroom visits and feeling unwell, with symptoms including a severe headache and sensations akin to taking a tranquilizer. She stopped taking torsemide  for a week due to these symptoms and noticed some swelling during that time. After resuming the medication, the frequency of urination has decreased.  She mentions a rash around her mouth, which she has experienced before when taking certain medications. She associates this with dryness and sometimes experiences a dry mouth at night, requiring her to drink water . She has not used hydrocortisone  cream for the rash.  She is currently taking torsemide  10 mg and has considered reducing the dose by cutting the pill in half. She also takes famotidine  and Nexium  for heartburn or indigestion, preferring Nexium  but sometimes using omeprazole . She is concerned about taking multiple medications and their side effects, noting a previous blood pressure reading of 140/90 when not taking torsemide .  No dizziness, lightheadedness, painful urination, or bloody urine. History of swelling in her legs and a dry mouth at night.      Outpatient Medications Prior to Visit  Medication Sig Dispense Refill   alum & mag hydroxide-simeth (MAALOX/MYLANTA) 200-200-20 MG/5ML suspension Take 15 mLs by mouth as needed for indigestion or heartburn.     estradiol  (ESTRACE ) 0.1 MG/GM vaginal cream Place 1 Applicatorful vaginally 3 (three) times a week. 42.5 g 0   fluticasone  (FLONASE ) 50 MCG/ACT nasal spray Place 2 sprays into both nostrils daily. 48 mL 3   loratadine  (CLARITIN ) 10 MG tablet Take 1 tablet (10 mg total) by mouth daily. 30 tablet 1   Multiple Vitamin (MULTIVITAMIN) tablet Take 1 tablet by mouth 3 (three) times a week.     Probiotic Product (PROBIOTIC-10 ULTIMATE) CAPS Take by mouth daily at 6 (six) AM.     psyllium (METAMUCIL) 58.6 % packet Take 1 packet by mouth daily.     rosuvastatin  (CRESTOR ) 20 MG tablet Take 1 tablet (20 mg total) by mouth daily. 90 tablet 3   tiZANidine  (ZANAFLEX ) 2 MG tablet Take 1-2 tablets (2-4 mg total) by mouth every 8 (eight) hours as needed. 60 tablet 1   VITAMIN D PO Take 1 tablet by mouth daily.     Wheat Dextrin (BENEFIBER DRINK MIX) PACK Take by mouth as needed.     esomeprazole  (NEXIUM ) 40 MG capsule Take 1 capsule (40 mg total) by mouth daily. 90 capsule 3   famotidine  (PEPCID ) 20 MG tablet Take 1 tablet (20 mg total) by mouth 2 (two) times daily. 180 tablet 3   predniSONE  (DELTASONE ) 10 MG tablet 3 tabs by mouth per day for 3 days,2tabs per day for 3 days,1tab per day for 3 days 18  tablet 0   torsemide  (DEMADEX ) 10 MG tablet Take 1 tablet (10 mg total) by mouth daily. 90 tablet 0   No facility-administered medications prior to visit.    ROS Review of Systems  Constitutional:  Negative for appetite change, chills, diaphoresis, fatigue and fever.  HENT:  Positive for voice change. Negative for sinus pressure and trouble swallowing.   Eyes: Negative.   Respiratory:  Negative for cough, chest tightness, shortness of breath and wheezing.   Cardiovascular:  Negative for chest pain,  palpitations and leg swelling.  Gastrointestinal:  Negative for abdominal pain, constipation, diarrhea, nausea and vomiting.  Endocrine: Positive for polyuria.  Genitourinary:  Positive for frequency. Negative for difficulty urinating, dysuria and hematuria.  Musculoskeletal: Negative.   Skin: Negative.   Neurological:  Negative for dizziness and weakness.  Hematological:  Negative for adenopathy. Does not bruise/bleed easily.  Psychiatric/Behavioral: Negative.      Objective:  BP 120/74 (BP Location: Left Arm, Patient Position: Sitting, Cuff Size: Normal)   Pulse 78   Temp 98.5 F (36.9 C) (Oral)   Ht 5' (1.524 m)   Wt 132 lb (59.9 kg)   SpO2 96%   BMI 25.78 kg/m   BP Readings from Last 3 Encounters:  01/26/24 120/74  01/26/24 120/74  01/07/24 (!) 142/90    Wt Readings from Last 3 Encounters:  01/26/24 132 lb (59.9 kg)  01/26/24 132 lb (59.9 kg)  01/07/24 131 lb 3.2 oz (59.5 kg)    Physical Exam Vitals reviewed.  Constitutional:      Appearance: Normal appearance.  HENT:     Mouth/Throat:     Mouth: Mucous membranes are moist.  Eyes:     General: No scleral icterus.    Conjunctiva/sclera: Conjunctivae normal.  Cardiovascular:     Rate and Rhythm: Normal rate and regular rhythm.     Heart sounds: No murmur heard.    No friction rub. No gallop.  Pulmonary:     Effort: Pulmonary effort is normal.     Breath sounds: No stridor. No wheezing, rhonchi or rales.  Abdominal:     General: Abdomen is flat.     Palpations: There is no mass.     Tenderness: There is no abdominal tenderness. There is no guarding.     Hernia: No hernia is present.  Musculoskeletal:        General: Normal range of motion.     Cervical back: Neck supple.     Right lower leg: No edema.     Left lower leg: No edema.  Lymphadenopathy:     Cervical: No cervical adenopathy.  Skin:    General: Skin is warm and dry.  Neurological:     General: No focal deficit present.     Mental Status:  She is alert.  Psychiatric:        Mood and Affect: Mood normal.        Behavior: Behavior normal.     Lab Results  Component Value Date   WBC 5.7 01/26/2024   HGB 13.2 01/26/2024   HCT 39.6 01/26/2024   PLT 264.0 01/26/2024   GLUCOSE 97 01/26/2024   CHOL 174 11/11/2023   TRIG 76 11/11/2023   HDL 68 11/11/2023   LDLDIRECT 124 (H) 07/10/2022   LDLCALC 92 11/11/2023   ALT 30 11/11/2023   AST 25 09/14/2023   NA 142 01/26/2024   K 3.1 (L) 01/26/2024   CL 98 01/26/2024   CREATININE 0.75 01/26/2024   BUN 14  01/26/2024   CO2 37 (H) 01/26/2024   TSH 3.22 09/14/2023   HGBA1C 6.5 01/26/2024    US  LT BREAST BX W LOC DEV 1ST LESION IMG BX SPEC US  GUIDE Addendum Date: 11/16/2023 ADDENDUM REPORT: 11/16/2023 12:35 ADDENDUM: Pathology revealed FIBROADENOMA of the LEFT breast, 0.5 cm mass at 12 o'clock, 7cmfn, (coil clip). This was found to be concordant by Dr. Chyrl Phi. Pathology results were discussed with the patient by telephone. The patient reported doing well after the biopsy with minimal tenderness at the site. Post biopsy instructions and care were reviewed and questions were answered. The patient was encouraged to call [facility] for any additional concerns. My direct phone number was provided. The patient was instructed to return for annual screening mammography. Pathology results reported by Hendricks Benders, RN on 11/16/2023. Electronically Signed   By: Reyes Phi M.D.   On: 11/16/2023 12:35   Result Date: 11/16/2023 CLINICAL DATA:  78 year old female presents for tissue sampling of 0.5 cm UPPER LEFT breast mass. EXAM: ULTRASOUND GUIDED LEFT BREAST CORE NEEDLE BIOPSY COMPARISON:  Previous exam(s). PROCEDURE: I met with the patient and we discussed the procedure of ultrasound-guided biopsy, including benefits and alternatives. We discussed the high likelihood of a successful procedure. We discussed the risks of the procedure, including infection, bleeding, tissue  injury, clip migration, and inadequate sampling. Informed written consent was given. The usual time-out protocol was performed immediately prior to the procedure. Using sterile technique and 1% Lidocaine  with and without epinephrine  as local anesthetic, under direct ultrasound visualization, a 12 gauge spring-loaded device was used to perform biopsy of the 0.5 cm mass at the 12 o'clock position of the LEFT breast 7 cm from the nipple using a MEDIAL approach. At the conclusion of the procedure a COIL shaped tissue marker clip was deployed into the biopsy cavity. Follow up 2 view mammogram was performed and dictated separately. IMPRESSION: Ultrasound guided biopsy of 0.5 cm UPPER LEFT breast mass. No apparent complications. Electronically Signed: By: Reyes Phi M.D. On: 11/15/2023 15:17   MM CLIP PLACEMENT LEFT Result Date: 11/15/2023 CLINICAL DATA:  Evaluate COIL biopsy clip placement following ultrasound-guided LEFT breast biopsy. EXAM: 3D DIAGNOSTIC LEFT MAMMOGRAM POST ULTRASOUND BIOPSY COMPARISON:  Previous exam(s). ACR Breast Density Category b: There are scattered areas of fibroglandular density. FINDINGS: 3D Mammographic images were obtained following ultrasound guided biopsy of the 0.5 cm mass at the 12 o'clock position of the LEFT breast. The COIL biopsy marking clip is in expected position at the site of biopsy and corresponds to the mammographic abnormality. IMPRESSION: Appropriate positioning of the COIL shaped biopsy marking clip at the site of biopsy in the UPPER LEFT breast. Final Assessment: Post Procedure Mammograms for Marker Placement Electronically Signed   By: Reyes Phi M.D.   On: 11/15/2023 15:33   MM 3D DIAGNOSTIC MAMMOGRAM UNILATERAL LEFT BREAST Result Date: 11/15/2023 CLINICAL DATA:  Recall for possible LEFT breast asymmetry. EXAM: DIGITAL DIAGNOSTIC UNILATERAL LEFT MAMMOGRAM WITH TOMOSYNTHESIS AND CAD; ULTRASOUND LEFT BREAST LIMITED TECHNIQUE: Left digital diagnostic mammography and  breast tomosynthesis was performed. The images were evaluated with computer-aided detection. ; Targeted ultrasound examination of the left breast was performed. COMPARISON:  Previous exam(s). ACR Breast Density Category b: There are scattered areas of fibroglandular density. FINDINGS: LEFT: Mammogram: The asymmetry seen in the upper LEFT breast persist on additional spot compression and full field MLO views. Ultrasound: Targeted sonographic evaluation of the upper LEFT breast demonstrates a hypoechoic oval mass with  slightly angular margins measuring 0.5 x 0.2 x 0.4 cm at 12 o'clock 7 CMFN, possibly corresponding to the mammographic abnormality. No enlarged LEFT axillary lymph nodes are identified. IMPRESSION: Indeterminate 0.5 cm LEFT breast mass at 12 o'clock 7 CMFN, possibly corresponding to the mammographic abnormality seen in the upper LEFT breast. RECOMMENDATION: Ultrasound-guided core needle biopsy of a 0.5 cm LEFT breast mass (12 o'clock 7 CMFN). I have discussed the findings and recommendations with the patient. The biopsy procedure was explained to the patient and questions were answered. Patient expressed their understanding of the biopsy recommendation. Patient will be scheduled for biopsy at her earliest convenience by the schedulers. Ordering provider will be notified. If applicable, a reminder letter will be sent to the patient regarding the next appointment. BI-RADS CATEGORY  4: Suspicious. Electronically Signed   By: Aliene Lloyd M.D.   On: 11/15/2023 11:31   US  LIMITED ULTRASOUND INCLUDING AXILLA LEFT BREAST  Result Date: 11/15/2023 CLINICAL DATA:  Recall for possible LEFT breast asymmetry. EXAM: DIGITAL DIAGNOSTIC UNILATERAL LEFT MAMMOGRAM WITH TOMOSYNTHESIS AND CAD; ULTRASOUND LEFT BREAST LIMITED TECHNIQUE: Left digital diagnostic mammography and breast tomosynthesis was performed. The images were evaluated with computer-aided detection. ; Targeted ultrasound examination of the left breast was  performed. COMPARISON:  Previous exam(s). ACR Breast Density Category b: There are scattered areas of fibroglandular density. FINDINGS: LEFT: Mammogram: The asymmetry seen in the upper LEFT breast persist on additional spot compression and full field MLO views. Ultrasound: Targeted sonographic evaluation of the upper LEFT breast demonstrates a hypoechoic oval mass with slightly angular margins measuring 0.5 x 0.2 x 0.4 cm at 12 o'clock 7 CMFN, possibly corresponding to the mammographic abnormality. No enlarged LEFT axillary lymph nodes are identified. IMPRESSION: Indeterminate 0.5 cm LEFT breast mass at 12 o'clock 7 CMFN, possibly corresponding to the mammographic abnormality seen in the upper LEFT breast. RECOMMENDATION: Ultrasound-guided core needle biopsy of a 0.5 cm LEFT breast mass (12 o'clock 7 CMFN). I have discussed the findings and recommendations with the patient. The biopsy procedure was explained to the patient and questions were answered. Patient expressed their understanding of the biopsy recommendation. Patient will be scheduled for biopsy at her earliest convenience by the schedulers. Ordering provider will be notified. If applicable, a reminder letter will be sent to the patient regarding the next appointment. BI-RADS CATEGORY  4: Suspicious. Electronically Signed   By: Aliene Lloyd M.D.   On: 11/15/2023 11:31    Assessment & Plan:  Essential hypertension -     Basic metabolic panel with GFR; Future -     CBC with Differential/Platelet; Future -     Spironolactone ; Take 1 tablet (25 mg total) by mouth daily.  Dispense: 90 tablet; Refill: 1 -     AMB Referral [facility] Care Management  Gastroesophageal reflux disease, unspecified whether esophagitis present -     Esomeprazole  Magnesium ; Take 1 capsule (40 mg total) by mouth daily.  Dispense: 90 capsule; Refill: 1 -     Famotidine ; Take 1 tablet (20 mg total) by mouth 2 (two) times daily.  Dispense: 180 tablet; Refill: 1  Prediabetes -      Hemoglobin A1c; Future  Diuretic-induced hypokalemia- Will discontinue the loop diuretic. -     Spironolactone ; Take 1 tablet (25 mg total) by mouth daily.  Dispense: 90 tablet; Refill: 1 -     AMB Referral [facility] Care Management  Type 2 diabetes mellitus with stage 3a chronic kidney disease, without long-term current use of insulin (HCC)-  Will start an SGLT2-inh. -     Empagliflozin ; Take 1 tablet (10 mg total) by mouth daily before breakfast.  Dispense: 90 tablet; Refill: 1 -     AMB Referral [facility] Care Management     Follow-up: Return in about 6 months (around 07/28/2024).  Debby Molt, MD

## 2024-01-26 NOTE — Patient Instructions (Signed)
 Thank you for coming in today.   You received an injection today. Seek immediate medical attention if the joint becomes red, extremely painful, or is oozing fluid.   Check back as needed  Reminder: Dr. Joane will be out of the office starting August 1st, for about 6 weeks

## 2024-01-26 NOTE — Progress Notes (Signed)
   LILLETTE Ileana Collet, PhD, LAT, ATC acting as a scribe for Artist Lloyd, MD.  [name] is a 78 y.o. female who presents to [facility] at [facility] today for cont'd R knee pain. Pt was last seen for her knee on 11/03/23 and was given a R knee steroid injection  Today, pt reports PT has been helpful. Slight swelling. R knee pain returned about 2 wks ago. She feeling like the bone in her knee is shifting.  Dx imaging: 12/24/22 R knee XR   Pertinent review of systems: No fevers or chills  Relevant historical information: Hypertension   Exam:  BP 120/74   Pulse 78   Ht 5' (1.524 m)   Wt 132 lb (59.9 kg)   SpO2 96%   BMI 25.78 kg/m  General: Well Developed, well nourished, and in no acute distress.   MSK: Right knee moderate joint effusion.  Normal-appearing otherwise.  Normal motion.  Some laxity to LCL stress test.    Lab and Radiology Results  Procedure: Real-time Ultrasound Guided Injection of right knee joint superior lateral patella space Device: Philips Affiniti 50G/GE Logiq Images permanently stored and available for review in PACS Verbal informed consent obtained.  Discussed risks and benefits of procedure. Warned about infection, bleeding, hyperglycemia damage to structures among others. Patient expresses understanding and agreement Time-out conducted.   Noted no overlying erythema, induration, or other signs of local infection.   Skin prepped in a sterile fashion.   Local anesthesia: Topical Ethyl chloride.   With sterile technique and under real time ultrasound guidance: 40 mg of Kenalog and 2 mL of Marcaine  injected into knee joint. Fluid seen entering the joint capsule.   Completed without difficulty   Pain immediately resolved suggesting accurate placement of the medication.   Advised to call if fevers/chills, erythema, induration, drainage, or persistent bleeding.   Images permanently stored and available for review in the ultrasound unit.   Impression: Technically successful ultrasound guided injection.        Assessment and Plan: 78 y.o. 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  Plan for steroid injection today.  Check back as needed.  Consider gel or Zilretta  injection in the future.   PDMP not reviewed this encounter. Orders Placed This Encounter  Procedures   US  LIMITED JOINT SPACE STRUCTURES LOW RIGHT(NO LINKED CHARGES)    Reason for Exam (SYMPTOM  OR DIAGNOSIS REQUIRED):   right knee pain    Preferred imaging location?:   [facility]   No orders of the defined types were placed in this encounter.    Discussed warning signs or symptoms. Please see discharge instructions. Patient expresses understanding.   The above documentation has been reviewed and is accurate and complete Artist Lloyd, M.D.

## 2024-01-26 NOTE — Patient Instructions (Signed)
 GERD in Adults: What to Know  Gastroesophageal reflux (GER) is when acid from your stomach flows up into your esophagus. Your esophagus is the part of your body that moves food from your mouth to your stomach. Normally, food goes down and stays in your stomach to be digested. But with GER, food and stomach acid may go back up. You may have a disease called gastroesophageal reflux disease (GERD) if the reflux: Happens often. Causes very bad symptoms. Makes your esophagus sore and swollen. Over time, GERD can make small holes called ulcers in the lining of your esophagus. What are the causes? GERD is caused by a problem with the muscle between your esophagus and stomach. This muscle is called the lower esophageal sphincter (LES). When it's weak or not normal, it doesn't close like it should. This means food and stomach acid can go back up into your esophagus. The muscle can be weak if: You smoke or use products with tobacco in them. You're pregnant. You have a type of hernia called a hiatal hernia. You eat certain foods and drinks. These include: Alcohol. Coffee. Chocolate. Onions. Peppermint. What increases the risk? Being overweight. Having a disease that affects your connective tissue. Taking NSAIDs, such as ibuprofen. What are the signs or symptoms? Heartburn. Trouble swallowing. Pain when you swallow. The feeling of having a lump in your throat. A bitter taste in your mouth. Bad breath. Having an upset or bloated stomach. Burping. Chest pain. Other conditions can also cause chest pain. Make sure you see your health care provider if you have chest pain. Wheezing. This is when you make high-pitched whistling sounds when you breathe, most often when you breathe out. A long-term cough or a cough at night. How is this diagnosed? GERD may be diagnosed based on your medical history and a physical exam. You may also have tests. These may include: An endoscopy. This test looks at your  stomach and esophagus with a small camera. A barium swallow test. This shows the shape and size of your esophagus and how well it's working. Tests of your esophagus to check for: Acid levels. Pressure. How is this treated? Treatment may depend on how bad your symptoms are. It may include: Changes to your diet and daily life. Medicines. Surgery. Follow these instructions at home: Eating and drinking Follow an eating plan as told by your provider. You may need to avoid certain foods and drinks. These may include: Coffee and tea, with or without caffeine. Alcohol. Energy drinks and sports drinks. Fizzy drinks or sodas. Chocolate and cocoa. Peppermint and mint flavorings. Garlic and onions. Horseradish. Spicy and acidic foods. These include: Peppers. Chili powder and curry powder. Vinegar. Hot sauces and BBQ sauce. Citrus fruits and juices. These include: Oranges. Lemons. Limes. Tomato-based foods. These include: Red sauce and pizza with red sauce. Chili. Salsa. Fried and fatty foods. These include: Donuts. [place] fries. Potato chips. High-fat dressings. High-fat meats. These include: Hot dogs and sausage. Rib eye steak. Ham and bacon. High-fat dairy items. These include: Whole milk. Butter. Cream cheese. Eat small meals often. Avoid eating big meals. Avoid drinking lots of liquid with your meals. Try not to eat meals during the 2-3 hours before bedtime. Try not to lie down right after you eat. Do not exercise right after you eat. Lifestyle  If you're overweight, lose an amount of weight that's healthy for you. Ask your provider about a safe weight loss goal. Do not smoke, vape, or use nicotine or tobacco. Wear  loose clothes. Do not wear things that are tight around your waist. When you sleep, try: Raising the head of your bed about 6 inches (15 cm). You can use a wedge to do this. Lying down on your left side. Try to lower your stress. If you need help doing  this, ask your provider. General instructions Take your medicines only as told. Do not take aspirin or ibuprofen unless you're told to. Watch for any changes in your symptoms. Do not bend over if it makes your symptoms worse. Contact a health care provider if: You have new symptoms. You have trouble: Drinking. Swallowing. Eating. It hurts to swallow. You have wheezing. You have a cough that won't go away. Your voice is hoarse. Your symptoms don't get better with treatment. Get help right away if: You have pain all of a sudden in your: Arm. Neck. Jaw. Teeth. Back. You feel sweaty, dizzy, or light-headed all of a sudden. You faint. You have chest pain or shortness of breath. You vomit and the vomit is: Green, yellow, or black. Looks like blood or coffee grounds. Your poop is red, bloody, or black. These symptoms may be an emergency. Call 911 right away. Do not wait to see if the symptoms will go away. Do not drive yourself to the hospital. This information is not intended to replace advice given to you by your health care provider. Make sure you discuss any questions you have with your health care provider. Document Revised: 04/27/2023 Document Reviewed: 11/11/2022 Elsevier Patient Education  2024 [employer].

## 2024-01-27 ENCOUNTER — Telehealth: Payer: Self-pay | Admitting: Internal Medicine

## 2024-01-27 ENCOUNTER — Telehealth: Payer: Self-pay

## 2024-01-27 DIAGNOSIS — E1122 Type 2 diabetes mellitus with diabetic chronic kidney disease: Secondary | ICD-10-CM

## 2024-01-27 NOTE — Telephone Encounter (Signed)
 Copied from CRM [id number]. Topic: Clinical - Medication Question >> Jan 27, 2024  3:05 PM Chasity T wrote: Reason for CRM: Patient is calling regarding medication empagliflozin  (JARDIANCE ) 10 MG TABS tablet. She is wanting to know if she can get an generic brand of the medication due to pharmacy explaining to her that she will have to pay around $350 for a 3 month supply which is to much for her at this time. She does not have access to mychart at the time so please give patient a call back regarding decision on medication please.

## 2024-01-28 ENCOUNTER — Other Ambulatory Visit: Payer: Self-pay | Admitting: Pharmacist

## 2024-01-28 ENCOUNTER — Telehealth: Payer: Self-pay | Admitting: Pharmacist

## 2024-01-28 DIAGNOSIS — E785 Hyperlipidemia, unspecified: Secondary | ICD-10-CM

## 2024-01-28 NOTE — Telephone Encounter (Signed)
 Call pt to assess Crestor  tolerability. As pt reluctant to take injectable therapy for cholesterol we had increased  Crestor  20 mg dose from 3 times week to daily. Hx of CK elevation on statin. Call to follow up on Crestor  tolerability. Pt reports she is tolerating it without any aches and pain will go for CK lab on Monday Aug 4.

## 2024-01-28 NOTE — Telephone Encounter (Signed)
 Can we get this patient some patient assistance?

## 2024-01-28 NOTE — Telephone Encounter (Signed)
 Called patient regarding Jardiance  cost. Explained to patient the cost is high due to her $250 drug deductible. After deductible is met, Jardiance  is $47 per 30 DS or $141 per 90 DS.   Explained reason Jardiance  was prescribed. Pt noted her BG increased possibly due to recent courses of steroids. It would be reasonable to focus on diet/lifestyle and recheck A1c in 3 months prior to starting Jardiance  given A1c 6.5% with goal <7-8% and GFR >60. Pt would like to try it and will call the pharmacy to do just a 1 month supply.   Reviewed side effects to monitor for when starting Jardiance .  Darrelyn Drum, PharmD, BCPS, CPP Clinical Pharmacist Practitioner [facility] [phone]

## 2024-01-28 NOTE — Progress Notes (Signed)
 Called patient regarding Jardiance  cost. Explained to patient the cost is high due to her $250 drug deductible. After deductible is met, Jardiance  is $47 per 30 DS or $141 per 90 DS.   Explained reason Jardiance  was prescribed. Pt noted her BG increased possibly due to recent courses of steroids. It would be reasonable to focus on diet/lifestyle and recheck A1c in 3 months prior to starting Jardiance  given A1c 6.5% with goal <7-8% and GFR >60. Pt would like to try it and will call the pharmacy to do just a 1 month supply.   Reviewed side effects to monitor for when starting Jardiance .  Darrelyn Drum, PharmD, BCPS, CPP Clinical Pharmacist Practitioner [facility] [phone]

## 2024-01-31 ENCOUNTER — Other Ambulatory Visit: Payer: Self-pay | Admitting: Internal Medicine

## 2024-01-31 DIAGNOSIS — E785 Hyperlipidemia, unspecified: Secondary | ICD-10-CM | POA: Diagnosis not present

## 2024-01-31 NOTE — Telephone Encounter (Signed)
 Forwarding to Dr. Joshua to review and advise.

## 2024-01-31 NOTE — Telephone Encounter (Signed)
 Jardiance  has been discontinued

## 2024-02-01 ENCOUNTER — Ambulatory Visit: Payer: Self-pay | Admitting: Pharmacist

## 2024-02-01 ENCOUNTER — Other Ambulatory Visit (HOSPITAL_COMMUNITY): Payer: Self-pay

## 2024-02-01 LAB — CK: Total CK: 358 U/L — ABNORMAL HIGH (ref 32–182)

## 2024-02-01 NOTE — Telephone Encounter (Signed)
 The patient's CK level is elevated, so the statin will be discontinued. The patient does not wish to start PCSK9 inhibitors or inclisiran, despite their minimal impact on CK levels. Other oral options, such as Zetia  and bempedoic acid, may also increase CK levels. Therefore, the plan is to hold all lipid-lowering therapy for one month to allow CK levels to normalize. A repeat CK will be obtained in one month, and if within normal limits, we will consider initiating Nexlizet.

## 2024-02-04 ENCOUNTER — Ambulatory Visit: Admitting: Podiatry

## 2024-02-04 ENCOUNTER — Encounter: Payer: Self-pay | Admitting: Podiatry

## 2024-02-04 DIAGNOSIS — M79674 Pain in right toe(s): Secondary | ICD-10-CM

## 2024-02-04 DIAGNOSIS — M79675 Pain in left toe(s): Secondary | ICD-10-CM | POA: Diagnosis not present

## 2024-02-04 DIAGNOSIS — B351 Tinea unguium: Secondary | ICD-10-CM

## 2024-02-04 NOTE — Progress Notes (Signed)
This patient presents to the office with chief complaint of long thick painful nails.  Patient says the nails are painful walking and wearing shoes.  This patient is unable to self treat.  This patient is unable to trim her nails since she is unable to reach her nails.  She presents to the office for preventative foot care services. ? ?General Appearance  Alert, conversant and in no acute stress. ? ?Vascular  Dorsalis pedis and posterior tibial  pulses are palpable  bilaterally.  Capillary return is within normal limits  bilaterally. Temperature is within normal limits  bilaterally. ? ?Neurologic  Senn-Weinstein monofilament wire test within normal limits  bilaterally. Muscle power within normal limits bilaterally. ? ?Nails Thick disfigured discolored nails with subungual debris  hallux nails  bilaterally. No evidence of bacterial infection or drainage bilaterally. ? ?Orthopedic  No limitations of motion  feet .  No crepitus or effusions noted.  No bony pathology or digital deformities noted. ? ?Skin  normotropic skin with no porokeratosis noted bilaterally.  No signs of infections or ulcers noted.    ? ?Onychomycosis  Nails  B/L.  Pain in right toes  Pain in left toes ? ?Debridement of nails both feet followed trimming the nails with dremel tool.   RTC 3 months. ? ? ?Nakia Koble DPM   ?

## 2024-02-11 ENCOUNTER — Telehealth: Payer: Self-pay | Admitting: *Deleted

## 2024-02-11 NOTE — Progress Notes (Signed)
 Care Guide Pharmacy Note  02/11/2024 Name: [name] MRN: [id number] DOB: [birth year]  Referred By: Joshua Debby LITTIE, MD Reason for referral: Complex Care Management (Outreach to schedule referral with pharmacist )   [name] is a 78 y.o. year old female who is a primary care patient of Joshua Debby LITTIE, MD.  [name] was referred to the pharmacist for assistance related to: HTN and DMII  Successful contact was made with the patient to discuss pharmacy services including being ready for the pharmacist to call at least 5 minutes before the scheduled appointment time and to have medication bottles and any blood pressure readings ready for review. The patient agreed to meet with the pharmacist via telephone visit on 02/29/2024  Thedford Franks, CMA [place]  [facility], [facility] Guide Direct Dial: [phone]  Fax: [phone] Website: [email or web address]

## 2024-02-29 ENCOUNTER — Ambulatory Visit: Payer: Self-pay | Admitting: Internal Medicine

## 2024-02-29 ENCOUNTER — Ambulatory Visit (INDEPENDENT_AMBULATORY_CARE_PROVIDER_SITE_OTHER)

## 2024-02-29 ENCOUNTER — Encounter: Payer: Self-pay | Admitting: Internal Medicine

## 2024-02-29 ENCOUNTER — Other Ambulatory Visit (INDEPENDENT_AMBULATORY_CARE_PROVIDER_SITE_OTHER): Admitting: Pharmacist

## 2024-02-29 ENCOUNTER — Ambulatory Visit (INDEPENDENT_AMBULATORY_CARE_PROVIDER_SITE_OTHER): Admitting: Internal Medicine

## 2024-02-29 VITALS — BP 130/78 | HR 70 | Temp 98.3°F | Ht 60.0 in | Wt 129.0 lb

## 2024-02-29 DIAGNOSIS — I10 Essential (primary) hypertension: Secondary | ICD-10-CM

## 2024-02-29 DIAGNOSIS — J069 Acute upper respiratory infection, unspecified: Secondary | ICD-10-CM

## 2024-02-29 DIAGNOSIS — E1122 Type 2 diabetes mellitus with diabetic chronic kidney disease: Secondary | ICD-10-CM

## 2024-02-29 DIAGNOSIS — R059 Cough, unspecified: Secondary | ICD-10-CM | POA: Diagnosis not present

## 2024-02-29 DIAGNOSIS — E785 Hyperlipidemia, unspecified: Secondary | ICD-10-CM

## 2024-02-29 DIAGNOSIS — I700 Atherosclerosis of aorta: Secondary | ICD-10-CM | POA: Diagnosis not present

## 2024-02-29 LAB — POC COVID19 BINAXNOW: SARS Coronavirus 2 Ag: NEGATIVE

## 2024-02-29 MED ORDER — BENZONATATE 200 MG PO CAPS: 200.0000 mg | ORAL_CAPSULE | Freq: Three times a day (TID) | ORAL | 0 refills | Status: DC | PRN

## 2024-02-29 NOTE — Patient Instructions (Addendum)
      A chest xray was ordered.       Medications changes include :   tessalon  perles.  Complete full course of Augmentin       Return if symptoms worsen or fail to improve.

## 2024-02-29 NOTE — Progress Notes (Signed)
 Subjective:    Patient ID: [name], female    DOB: [birth year], 78 y.o.   MRN: [id number]      HPI [name] is here for  Chief Complaint  Patient presents with   Cough    Request chest xray; Feels like she hears something in her chest; Cough, congestion, nasal drainage (started last Wednesday-Thursday)   Cold symptoms started one week ago.     It started with a sore throat which has resolved.  She has a cough - occ brings up phlegm.  She has runny nose, sneezing, mild nasal congestion, ear pain.    She is having some left mouth pain that is worse with opening her mouth.  She saw her dentist and then an endodontist.   Has taken robitussin and tylenol .    She recently took amoxicillin  for a dental infection and the endodontist gave her augmentin , which she started Saturday, 3 days ago.    Covid test x 2 negative.      Medications and allergies reviewed with patient and updated if appropriate.  Current Outpatient Medications on File Prior to Visit  Medication Sig Dispense Refill   amoxicillin  (AMOXIL ) 500 MG tablet Take 500 mg by mouth 3 (three) times daily.     hydrochlorothiazide  (HYDRODIURIL ) 12.5 MG tablet      torsemide  (DEMADEX ) 10 MG tablet      alum & mag hydroxide-simeth (MAALOX/MYLANTA) 200-200-20 MG/5ML suspension Take 15 mLs by mouth as needed for indigestion or heartburn.     esomeprazole  (NEXIUM ) 40 MG capsule Take 1 capsule (40 mg total) by mouth daily. 90 capsule 1   estradiol  (ESTRACE ) 0.1 MG/GM vaginal cream Place 1 Applicatorful vaginally 3 (three) times a week. 42.5 g 0   famotidine  (PEPCID ) 20 MG tablet Take 1 tablet (20 mg total) by mouth 2 (two) times daily. 180 tablet 1   fluticasone  (FLONASE ) 50 MCG/ACT nasal spray Place 2 sprays into both nostrils daily. 48 mL 3   JARDIANCE  10 MG TABS tablet Take 10 mg by mouth every morning.     loratadine  (CLARITIN ) 10 MG tablet Take 1 tablet (10 mg total) by mouth daily. 30 tablet 1   Multiple Vitamin  (MULTIVITAMIN) tablet Take 1 tablet by mouth 3 (three) times a week.     Probiotic Product (PROBIOTIC-10 ULTIMATE) CAPS Take by mouth daily at 6 (six) AM.     psyllium (METAMUCIL) 58.6 % packet Take 1 packet by mouth daily.     spironolactone  (ALDACTONE ) 25 MG tablet Take 1 tablet (25 mg total) by mouth daily. 90 tablet 1   tiZANidine  (ZANAFLEX ) 2 MG tablet Take 1-2 tablets (2-4 mg total) by mouth every 8 (eight) hours as needed. 60 tablet 1   VITAMIN D PO Take 1 tablet by mouth daily.     Wheat Dextrin (BENEFIBER DRINK MIX) PACK Take by mouth as needed.     No current facility-administered medications on file prior to visit.    Review of Systems  Constitutional:  Negative for chills and fever.  HENT:  Positive for congestion (some), ear pain, rhinorrhea, sneezing and sore throat. Negative for sinus pain.   Respiratory:  Positive for cough (productive- improved). Negative for shortness of breath and wheezing.   Gastrointestinal:  Positive for nausea (related to medication).  Musculoskeletal:  Negative for myalgias.  Neurological:  Negative for dizziness, light-headedness and headaches.       Objective:   Vitals:   02/29/24 1339  BP: 130/78  Pulse:  70  Temp: 98.3 F (36.8 C)  SpO2: 96%   BP Readings from Last 3 Encounters:  02/29/24 130/78  01/26/24 120/74  01/26/24 120/74   Wt Readings from Last 3 Encounters:  02/29/24 129 lb (58.5 kg)  01/26/24 132 lb (59.9 kg)  01/26/24 132 lb (59.9 kg)   Body mass index is 25.19 kg/m.    Physical Exam Constitutional:      General: She is not in acute distress.    Appearance: Normal appearance. She is not ill-appearing.  HENT:     Head: Normocephalic and atraumatic.     Right Ear: Tympanic membrane, ear canal and external ear normal.     Left Ear: Tympanic membrane, ear canal and external ear normal.     Mouth/Throat:     Mouth: Mucous membranes are moist.     Pharynx: No oropharyngeal exudate or posterior oropharyngeal  erythema.  Eyes:     Conjunctiva/sclera: Conjunctivae normal.  Cardiovascular:     Rate and Rhythm: Normal rate and regular rhythm.  Pulmonary:     Effort: Pulmonary effort is normal. No respiratory distress.     Breath sounds: Normal breath sounds. No wheezing or rales.  Musculoskeletal:     Cervical back: Neck supple. No tenderness.  Lymphadenopathy:     Cervical: No cervical adenopathy.  Skin:    General: Skin is warm and dry.  Neurological:     Mental Status: She is alert.       Covid test here negative.    DG Chest 2 View CLINICAL DATA:  Cough.  EXAM: CHEST - 2 VIEW  COMPARISON:  07/15/2023.  FINDINGS: The heart size and mediastinal contours are within normal limits. Aortic atherosclerosis. Similar bibasilar linear atelectasis/scarring. No focal consolidation, pleural effusion, or pneumothorax. Degenerative changes of the thoracic spine. 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  IMPRESSION: No acute cardiopulmonary findings.  Electronically Signed   By: Harrietta Sherry M.D.   On: 02/29/2024 15:20    Assessment & Plan:     URI: Acute Symptoms sound viral in nature She just saw her dentist and endodontist Took amoxicillin  and now on augmentin  Will get a CXR Covid test here negative Already on an antibiotic Continue otc symptomatic treatment Tessalon  perles 200 mg TID prn for cough  Hypertension Chronic BP well controlled Continue spironolactone  25 mg daily

## 2024-02-29 NOTE — Patient Instructions (Signed)
 It was a pleasure speaking with you today!  Continue spironolactone  and Jardiance . Let me know if you would like to try to get patient assistance for Farxiga (Jardiance  alternative) Stop by lab today to check your potassium and CK  Feel free to call with any questions or concerns!  Darrelyn Drum, PharmD, BCPS, CPP Clinical Pharmacist Practitioner [facility] [phone]

## 2024-02-29 NOTE — Progress Notes (Signed)
 02/29/2024 Name: LAKARA WEILAND MRN: [id number] DOB: January 30, 1946  Chief Complaint  Patient presents with   Diabetes   Hypertension   Medication Management    [name] is a 78 y.o. year old female who presented for a telephone visit.   They were referred to the pharmacist by their PCP for assistance in managing diabetes and hypertension.   Subjective:  Care Team: Primary Care Provider: Joshua Debby LITTIE, MD ; Next Scheduled Visit: none scheduled  Medication Access/Adherence  Current Pharmacy:  [facility] [phone], [place] - [address] [place] [address] Phone: [phone] Fax: [phone]   Patient reports affordability concerns with their medications: Yes  Patient reports access/transportation concerns to their pharmacy: No  Patient reports adherence concerns with their medications:  No    **Pt is currently on antibiotics for dental issues  Diabetes: *New diagnosis 12/2023 Current medications: Jardiance  10 mg daily Medications tried in the past: none  Patient denies hypoglycemic s/sx including dizziness, shakiness, sweating.  Hypertension: Current medications: spironolactone  25 mg daily Medications previously tried: torsemide   Patient has a validated, automated, upper arm home BP cuff Current blood pressure readings readings: 137/78 (87), usually 130s systolic  Patient denies hypotensive s/sx including dizziness, lightheadedness.  Patient denies hypertensive symptoms including headache, chest pain, shortness of breath  Hyperlipidemia: Recently discontinued rosuvastatin  due to elevated CK Has been working with cardio Fonda Blanch, PharmD) for lipid management  Objective:  Lab Results  Component Value Date   HGBA1C 6.5 01/26/2024    Lab Results  Component Value Date   CREATININE 0.75 01/26/2024   BUN 14 01/26/2024   NA 142 01/26/2024   K 3.1 (L) 01/26/2024   CL 98 01/26/2024   CO2 37 (H) 01/26/2024    Lab  Results  Component Value Date   CHOL 174 11/11/2023   HDL 68 11/11/2023   LDLCALC 92 11/11/2023   LDLDIRECT 124 (H) 07/10/2022   TRIG 76 11/11/2023   CHOLHDL 2.6 11/11/2023    Medications Reviewed Today     Reviewed by Merceda Lela SAUNDERS, RPH (Pharmacist) on 02/29/24 at 1024  Med List Status: <None>   Medication Order Taking? Sig Documenting Provider Last Dose Status Informant  alum & mag hydroxide-simeth (MAALOX/MYLANTA) 200-200-20 MG/5ML suspension [id number]  Take 15 mLs by mouth as needed for indigestion or heartburn. [provider]  Active   esomeprazole  (NEXIUM ) 40 MG capsule [id number]  Take 1 capsule (40 mg total) by mouth daily. Joshua Debby LITTIE, MD  Active   estradiol  (ESTRACE ) 0.1 MG/GM vaginal cream [id number]  Place 1 Applicatorful vaginally 3 (three) times a week. Eldonna Suzen Octave, MD  Active   famotidine  (PEPCID ) 20 MG tablet [id number]  Take 1 tablet (20 mg total) by mouth 2 (two) times daily. Joshua Debby LITTIE, MD  Active   fluticasone  (FLONASE ) 50 MCG/ACT nasal spray [id number]  Place 2 sprays into both nostrils daily. Rollene Almarie LABOR, MD  Active   JARDIANCE  10 MG TABS tablet [id number] Yes Take 10 mg by mouth every morning. [provider]  Active   loratadine  (CLARITIN ) 10 MG tablet [id number]  Take 1 tablet (10 mg total) by mouth daily. Lendia Nordmann L, NP-C  Active   Multiple Vitamin (MULTIVITAMIN) tablet [id number]  Take 1 tablet by mouth 3 (three) times a week. [provider]  Active Self  Probiotic Product (PROBIOTIC-10 ULTIMATE) CAPS [id number]  Take by mouth daily at 6 (six) AM. [provider]  Active  psyllium (METAMUCIL) 58.6 % packet [id number]  Take 1 packet by mouth daily. [provider]  Active   spironolactone  (ALDACTONE ) 25 MG tablet [id number] Yes Take 1 tablet (25 mg total) by mouth daily. Joshua Debby CROME, MD  Active   tiZANidine  (ZANAFLEX ) 2 MG tablet [id number]  Take 1-2 tablets (2-4 mg total) by mouth  every 8 (eight) hours as needed. Corey, Evan S, MD  Active   VITAMIN D PO [id number]  Take 1 tablet by mouth daily. [provider]  Active   Wheat Dextrin PATIENCE DRINK MIX) PACK [id number]  Take by mouth as needed. [provider]  Active             [phone]  Assessment/Plan:   Diabetes: - Currently controlled; goal A1c <7%. Cardiorenal risk reduction is optimized.. Blood pressure is not at goal <130/80. LDL is not at goal.  - Reviewed goal A1c - Discussed diet including reducing carbohydrate/starchy foods portion sizes - Recommend to continue Jardiance . - Evaluated for eligibility for Jardiance  PAP - patient is over the income cut off. She likely would qualify for Farxiga PAP, however she is hesitant to take a different medication as she tolerates Jardiance  well.  Hypertension: - Currently uncontrolled, BP goal <130/80. Uncontrolled at home, however currently has a cold.  - Reviewed appropriate blood pressure monitoring technique and reviewed goal blood pressure. Recommended to check home blood pressure and heart rate  - Recommend to continue spironolactone  25 mg daily  - BMP today while in office to f/u on potassium   Hyperlipidemia/ASCVD Risk Reduction: - Currently uncontrolled.  - Pt is due for CK s/p rosuvastatin  d/c, per note from Robbi Blanch, PharmD who has been working with patient for lipid management. Contacted Vaishali regarding CK, will order to get checked today with other labwork while in office. She agreed with the plan.   Follow Up Plan: Labs today  Darrelyn Drum, PharmD, BCPS, CPP Clinical Pharmacist Practitioner [facility] [phone]

## 2024-03-03 ENCOUNTER — Telehealth: Payer: Self-pay | Admitting: Pharmacist

## 2024-03-03 ENCOUNTER — Other Ambulatory Visit

## 2024-03-03 DIAGNOSIS — E785 Hyperlipidemia, unspecified: Secondary | ICD-10-CM | POA: Diagnosis not present

## 2024-03-03 LAB — BASIC METABOLIC PANEL WITH GFR
BUN: 11 mg/dL (ref 6–23)
CO2: 33 meq/L — ABNORMAL HIGH (ref 19–32)
Calcium: 9.8 mg/dL (ref 8.4–10.5)
Chloride: 99 meq/L (ref 96–112)
Creatinine, Ser: 0.59 mg/dL (ref 0.40–1.20)
GFR: 86.33 mL/min (ref >60.00)
Glucose, Bld: 93 mg/dL (ref 70–99)
Potassium: 3.1 meq/L — ABNORMAL LOW (ref 3.5–5.1)
Sodium: 142 meq/L (ref 135–145)

## 2024-03-03 LAB — CK: Total CK: 419 U/L — ABNORMAL HIGH (ref 17–177)

## 2024-03-03 NOTE — Telephone Encounter (Signed)
 Spoke to the patient, who is currently undergoing further workup to determine the cause of her elevated creatine kinase (CK) levels. We discussed that Nexletol (bempedoic acid) can cause an increase in CK levels. Although this side effect is less common and typically more modest compared to statins, it is a known potential adverse effect. The patient was informed that in clinical trials, approximately 1.0% of patients taking Nexletol experienced CK elevations >=5 times the upper limit of normal, compared to 0.6% in the placebo group. The patient expressed reluctance to initiate any injectable lipid-lowering therapies at this time but will consider her options and let us  know when she feels ready to proceed.

## 2024-03-04 LAB — CK: Total CK: 471 U/L — ABNORMAL HIGH (ref 32–182)

## 2024-03-06 ENCOUNTER — Ambulatory Visit: Payer: Self-pay | Admitting: Internal Medicine

## 2024-03-07 ENCOUNTER — Other Ambulatory Visit: Payer: Self-pay | Admitting: Internal Medicine

## 2024-03-09 ENCOUNTER — Other Ambulatory Visit: Payer: Self-pay | Admitting: Internal Medicine

## 2024-03-09 DIAGNOSIS — E876 Hypokalemia: Secondary | ICD-10-CM

## 2024-03-09 MED ORDER — POTASSIUM CHLORIDE ER 10 MEQ PO TBCR: 10.0000 meq | EXTENDED_RELEASE_TABLET | Freq: Two times a day (BID) | ORAL | 0 refills | Status: DC

## 2024-04-10 ENCOUNTER — Ambulatory Visit: Payer: Self-pay

## 2024-04-10 NOTE — Telephone Encounter (Signed)
 Pt has appt scheduled.

## 2024-04-10 NOTE — Telephone Encounter (Signed)
 FYI Only or Action Required?: Action required by provider: request for appointment.  Patient was last seen in primary care on 02/29/2024 by Geofm Glade PARAS, MD.  Called Nurse Triage reporting Palpitations.  Symptoms began several weeks ago.  Interventions attempted: Nothing.  Symptoms are: unchanged.  Triage Disposition: See PCP When Office is Open (Within 3 Days)  Patient/caregiver understands and will follow disposition?: YesCopied from CRM [id number]. Topic: Clinical - Red Word Triage >> Apr 10, 2024 11:43 AM [name] wrote: [name] that prompted transfer to Nurse Triage: fast heartbeat Patient is calling in because BP medication spironolactone  (ALDACTONE ) 25 MG tablet is making her hear her heart beat.  Pharmacist claims she shouldn't take it with potassium or at least space it out. Reason for Disposition  [1] Palpitations AND [2] no improvement after using Care Advice  Answer Assessment - Initial Assessment Questions It's random when it hits. I can be resting or busy. Pharmacist told me I shouldn't take it with my potassium.I have to take potassium twice daily.  She said it needs to be spaced out. Not sure how to manage/space out appropriately.  Pt denies SOB, chest pain.      1. DESCRIPTION: Please describe your heart rate or heartbeat that you are having (e.g., fast/slow, regular/irregular, skipped or extra beats, palpitations)    Just feel/heard  it beating hard 2. ONSET: When did it start? (e.g., minutes, hours, days)      2 weeks ago  3. DURATION: How long does it last (e.g., seconds, minutes, hours)     2 mins 4. PATTERN Does it come and go, or has it been constant since it started?  Does it get worse with exertion?   Are you feeling it now?     Comes and goes 5. TAP: Using your hand, can you tap out what you are feeling on a chair or table in front of you, so that I can hear? Note: Not all patients can do this.       na 6. HEART RATE: Can you tell me  your heart rate? How many beats in 15 seconds?  Note: Not all patients can do this.       60; 77 7. RECURRENT SYMPTOM: Have you ever had this before? If Yes, ask: When was the last time? and What happened that time?      na 8. CAUSE: What do you think is causing the palpitations?     Not sure 9. CARDIAC HISTORY: Do you have any history of heart disease? (e.g., heart attack, angina, bypass surgery, angioplasty, arrhythmia)      denies 10. OTHER SYMPTOMS: Do you have any other symptoms? (e.g., dizziness, chest pain, sweating, difficulty breathing) denies  Protocols used: Heart Rate and Heartbeat Questions-A-AH

## 2024-04-11 ENCOUNTER — Telehealth: Payer: Self-pay

## 2024-04-11 ENCOUNTER — Ambulatory Visit: Admitting: Internal Medicine

## 2024-04-11 ENCOUNTER — Encounter: Payer: Self-pay | Admitting: Internal Medicine

## 2024-04-11 ENCOUNTER — Ambulatory Visit: Payer: Self-pay | Admitting: Internal Medicine

## 2024-04-11 ENCOUNTER — Ambulatory Visit: Attending: Internal Medicine

## 2024-04-11 VITALS — BP 120/70 | HR 70 | Temp 98.5°F | Ht 60.0 in | Wt 127.2 lb

## 2024-04-11 DIAGNOSIS — E785 Hyperlipidemia, unspecified: Secondary | ICD-10-CM

## 2024-04-11 DIAGNOSIS — E876 Hypokalemia: Secondary | ICD-10-CM

## 2024-04-11 DIAGNOSIS — R002 Palpitations: Secondary | ICD-10-CM

## 2024-04-11 DIAGNOSIS — N1831 Chronic kidney disease, stage 3a: Secondary | ICD-10-CM | POA: Diagnosis not present

## 2024-04-11 DIAGNOSIS — I2510 Atherosclerotic heart disease of native coronary artery without angina pectoris: Secondary | ICD-10-CM | POA: Diagnosis not present

## 2024-04-11 DIAGNOSIS — E1122 Type 2 diabetes mellitus with diabetic chronic kidney disease: Secondary | ICD-10-CM | POA: Diagnosis not present

## 2024-04-11 LAB — URINALYSIS
Bilirubin Urine: NEGATIVE
Ketones, ur: NEGATIVE
Leukocytes,Ua: NEGATIVE
Nitrite: NEGATIVE
Specific Gravity, Urine: 1.01 (ref 1.000–1.030)
Total Protein, Urine: NEGATIVE
Urine Glucose: >=1000 — AB
Urobilinogen, UA: 0.2 (ref 0.0–1.0)
pH: 6.5 (ref 5.0–8.0)

## 2024-04-11 LAB — COMPREHENSIVE METABOLIC PANEL WITH GFR
ALT: 23 U/L (ref 0–35)
AST: 27 U/L (ref 0–37)
Albumin: 4.3 g/dL (ref 3.5–5.2)
Alkaline Phosphatase: 39 U/L (ref 39–117)
BUN: 9 mg/dL (ref 6–23)
CO2: 35 meq/L — ABNORMAL HIGH (ref 19–32)
Calcium: 9 mg/dL (ref 8.4–10.5)
Chloride: 100 meq/L (ref 96–112)
Creatinine, Ser: 0.7 mg/dL (ref 0.40–1.20)
GFR: 82.78 mL/min (ref >60.00)
Glucose, Bld: 90 mg/dL (ref 70–99)
Potassium: 2.8 meq/L — CL (ref 3.5–5.1)
Sodium: 143 meq/L (ref 135–145)
Total Bilirubin: 1.1 mg/dL (ref 0.2–1.2)
Total Protein: 6.9 g/dL (ref 6.0–8.3)

## 2024-04-11 LAB — HEMOGLOBIN A1C: Hgb A1c MFr Bld: 5.3 % (ref 4.6–6.5)

## 2024-04-11 LAB — CBC WITH DIFFERENTIAL/PLATELET
Basophils Absolute: 0 K/uL (ref 0.0–0.1)
Basophils Relative: 0.5 % (ref 0.0–3.0)
Eosinophils Absolute: 0 K/uL (ref 0.0–0.7)
Eosinophils Relative: 1 % (ref 0.0–5.0)
HCT: 40.3 % (ref 36.0–46.0)
Hemoglobin: 13.4 g/dL (ref 12.0–15.0)
Lymphocytes Relative: 34.3 % (ref 12.0–46.0)
Lymphs Abs: 1.5 K/uL (ref 0.7–4.0)
MCHC: 33.4 g/dL (ref 30.0–36.0)
MCV: 93.7 fl (ref 78.0–100.0)
Monocytes Absolute: 0.4 K/uL (ref 0.1–1.0)
Monocytes Relative: 8.8 % (ref 3.0–12.0)
Neutro Abs: 2.4 K/uL (ref 1.4–7.7)
Neutrophils Relative %: 55.4 % (ref 43.0–77.0)
Platelets: 287 K/uL (ref 150.0–400.0)
RBC: 4.3 Mil/uL (ref 3.87–5.11)
RDW: 13.4 % (ref 11.5–15.5)
WBC: 4.4 K/uL (ref 4.0–10.5)

## 2024-04-11 LAB — MAGNESIUM: Magnesium: 0.6 mg/dL — CL (ref 1.5–2.5)

## 2024-04-11 LAB — TSH: TSH: 1.98 u[IU]/mL (ref 0.35–5.50)

## 2024-04-11 MED ORDER — MAGNESIUM OXIDE 420 MG PO TABS: ORAL_TABLET | ORAL | 0 refills | Status: DC

## 2024-04-11 NOTE — Assessment & Plan Note (Addendum)
?  PSVT Labs Zio patch x 2 weeks EKG, Heart CTA and other tests were reviewed Will need to correct hypomagnesemia and hypokalemia.  Restart spironolactone .  Take potassium 10 mEq tablets for a day for 2 days, then switch to 2 a day. Replenish low magnesium

## 2024-04-11 NOTE — Assessment & Plan Note (Signed)
 New.  Will replenish magnesium  orally

## 2024-04-11 NOTE — Progress Notes (Signed)
 Subjective:  Patient ID: SAMYIA MOTTER, female    DOB: 23-Jan-1946  Age: 78 y.o. MRN: [id number]  CC: Palpitations (Palpitations)   HPI IMANII GOSDIN presents for palpitations off and on x 1-2 weeks x 2 min long pounding...daily ?after she started to take Clindamycin (stopped on Oct 2nd) No CP or SOB.  Pt took abx - amoxicillin  x 4 wks for gum pain, then Clinda po 2023 CT angio:  Mild mixed non-obstructive CAD, CADRADS = 2. 2. Coronary calcium  score of 87.9.  The patient stopped taking spironolactone  a week ago due to risk of cancer.  It sounds like she stopped taking potassium for a few days too...  Outpatient Medications Prior to Visit  Medication Sig Dispense Refill   alum & mag hydroxide-simeth (MAALOX/MYLANTA) 200-200-20 MG/5ML suspension Take 15 mLs by mouth as needed for indigestion or heartburn.     benzonatate  (TESSALON ) 200 MG capsule Take 1 capsule (200 mg total) by mouth 3 (three) times daily as needed for cough. 30 capsule 0   esomeprazole  (NEXIUM ) 40 MG capsule Take 1 capsule (40 mg total) by mouth daily. 90 capsule 1   estradiol  (ESTRACE ) 0.1 MG/GM vaginal cream Place 1 Applicatorful vaginally 3 (three) times a week. 42.5 g 0   famotidine  (PEPCID ) 20 MG tablet Take 1 tablet (20 mg total) by mouth 2 (two) times daily. 180 tablet 1   fluticasone  (FLONASE ) 50 MCG/ACT nasal spray Place 2 sprays into both nostrils daily. 48 mL 3   JARDIANCE  10 MG TABS tablet Take 10 mg by mouth every morning.     loratadine  (CLARITIN ) 10 MG tablet Take 1 tablet (10 mg total) by mouth daily. 30 tablet 1   Multiple Vitamin (MULTIVITAMIN) tablet Take 1 tablet by mouth 3 (three) times a week.     potassium chloride  (KLOR-CON  10) 10 MEQ tablet Take 1 tablet (10 mEq total) by mouth 2 (two) times daily. 180 tablet 0   Probiotic Product (PROBIOTIC-10 ULTIMATE) CAPS Take by mouth daily at 6 (six) AM.     psyllium (METAMUCIL) 58.6 % packet Take 1 packet by mouth daily.     spironolactone  (ALDACTONE ) 25  MG tablet Take 1 tablet (25 mg total) by mouth daily. 90 tablet 1   tiZANidine  (ZANAFLEX ) 2 MG tablet Take 1-2 tablets (2-4 mg total) by mouth every 8 (eight) hours as needed. 60 tablet 1   torsemide  (DEMADEX ) 10 MG tablet      VITAMIN D PO Take 1 tablet by mouth daily.     Wheat Dextrin (BENEFIBER DRINK MIX) PACK Take by mouth as needed.     amoxicillin  (AMOXIL ) 500 MG tablet Take 500 mg by mouth 3 (three) times daily.     No facility-administered medications prior to visit.    ROS: Review of Systems  Constitutional:  Negative for activity change, appetite change, chills, fatigue and unexpected weight change.  HENT:  Negative for congestion, mouth sores and sinus pressure.   Eyes:  Negative for visual disturbance.  Respiratory:  Negative for cough, chest tightness and shortness of breath.   Cardiovascular:  Positive for palpitations. Negative for chest pain and leg swelling.  Gastrointestinal:  Negative for abdominal pain and nausea.  Genitourinary:  Negative for difficulty urinating, frequency and vaginal pain.  Musculoskeletal:  Negative for back pain and gait problem.  Skin:  Negative for pallor and rash.  Neurological:  Negative for dizziness, tremors, weakness, numbness and headaches.  Psychiatric/Behavioral:  Negative for confusion and sleep disturbance.  Objective:  BP 120/70   Pulse 70   Temp 98.5 F (36.9 C)   Ht 5' (1.524 m)   Wt 127 lb 3.2 oz (57.7 kg)   SpO2 98%   BMI 24.84 kg/m   BP Readings from Last 3 Encounters:  04/11/24 120/70  02/29/24 130/78  01/26/24 120/74    Wt Readings from Last 3 Encounters:  04/11/24 127 lb 3.2 oz (57.7 kg)  02/29/24 129 lb (58.5 kg)  01/26/24 132 lb (59.9 kg)    Physical Exam Constitutional:      General: She is not in acute distress.    Appearance: Normal appearance. She is well-developed.  HENT:     Head: Normocephalic.     Right Ear: External ear normal.     Left Ear: External ear normal.     Nose: Nose normal.   Eyes:     General:        Right eye: No discharge.        Left eye: No discharge.     Conjunctiva/sclera: Conjunctivae normal.     Pupils: Pupils are equal, round, and reactive to light.  Neck:     Thyroid : No thyromegaly.     Vascular: No JVD.     Trachea: No tracheal deviation.  Cardiovascular:     Rate and Rhythm: Normal rate and regular rhythm.     Heart sounds: Normal heart sounds.  Pulmonary:     Effort: No respiratory distress.     Breath sounds: No stridor. No wheezing.  Abdominal:     General: Bowel sounds are normal. There is no distension.     Palpations: Abdomen is soft. There is no mass.     Tenderness: There is no abdominal tenderness. There is no guarding or rebound.  Musculoskeletal:        General: No tenderness.     Cervical back: Normal range of motion and neck supple. No rigidity.     Right lower leg: No edema.     Left lower leg: No edema.  Lymphadenopathy:     Cervical: No cervical adenopathy.  Skin:    Findings: No erythema or rash.  Neurological:     Mental Status: She is oriented to person, place, and time.     Cranial Nerves: No cranial nerve deficit.     Motor: No abnormal muscle tone.     Coordination: Coordination normal.     Deep Tendon Reflexes: Reflexes normal.  Psychiatric:        Behavior: Behavior normal.        Thought Content: Thought content normal.        Judgment: Judgment normal.      A total time of 45 minutes was spent preparing to see the patient, reviewing tests, x-rays, operative reports and other medical records.  Also, obtaining history and performing comprehensive physical exam.  Additionally, counseling the patient regarding the above listed issues.   Finally, documenting clinical information in the health records, coordination of care, educating the patient -palpitations, electrolyte disbalance.  Lab Results  Component Value Date   WBC 4.4 04/11/2024   HGB 13.4 04/11/2024   HCT 40.3 04/11/2024   PLT 287.0 04/11/2024    GLUCOSE 90 04/11/2024   CHOL 174 11/11/2023   TRIG 76 11/11/2023   HDL 68 11/11/2023   LDLDIRECT 124 (H) 07/10/2022   LDLCALC 92 11/11/2023   ALT 23 04/11/2024   AST 27 04/11/2024   NA 143 04/11/2024   K 2.8 (LL) 04/11/2024  CL 100 04/11/2024   CREATININE 0.70 04/11/2024   BUN 9 04/11/2024   CO2 35 (H) 04/11/2024   TSH 1.98 04/11/2024   HGBA1C 5.3 04/11/2024    US  LT BREAST BX W LOC DEV 1ST LESION IMG BX SPEC US  GUIDE Addendum Date: 11/16/2023 ADDENDUM REPORT: 11/16/2023 12:35 ADDENDUM: Pathology revealed FIBROADENOMA of the LEFT breast, 0.5 cm mass at 12 o'clock, 7cmfn, (coil clip). This was found to be concordant by Dr. Chyrl Phi. Pathology results were discussed with the patient by telephone. The patient reported doing well after the biopsy with minimal tenderness at the site. Post biopsy instructions and care were reviewed and questions were answered. The patient was encouraged to call [facility] for any additional concerns. My direct phone number was provided. The patient was instructed to return for annual screening mammography. Pathology results reported by Hendricks Benders, RN on 11/16/2023. Electronically Signed   By: Reyes Phi M.D.   On: 11/16/2023 12:35   Result Date: 11/16/2023 CLINICAL DATA:  78 year old female presents for tissue sampling of 0.5 cm UPPER LEFT breast mass. EXAM: ULTRASOUND GUIDED LEFT BREAST CORE NEEDLE BIOPSY COMPARISON:  Previous exam(s). PROCEDURE: I met with the patient and we discussed the procedure of ultrasound-guided biopsy, including benefits and alternatives. We discussed the high likelihood of a successful procedure. We discussed the risks of the procedure, including infection, bleeding, tissue injury, clip migration, and inadequate sampling. Informed written consent was given. The usual time-out protocol was performed immediately prior to the procedure. Using sterile technique and 1% Lidocaine  with and without epinephrine  as  local anesthetic, under direct ultrasound visualization, a 12 gauge spring-loaded device was used to perform biopsy of the 0.5 cm mass at the 12 o'clock position of the LEFT breast 7 cm from the nipple using a MEDIAL approach. At the conclusion of the procedure a COIL shaped tissue marker clip was deployed into the biopsy cavity. Follow up 2 view mammogram was performed and dictated separately. IMPRESSION: Ultrasound guided biopsy of 0.5 cm UPPER LEFT breast mass. No apparent complications. Electronically Signed: By: Reyes Phi M.D. On: 11/15/2023 15:17   MM CLIP PLACEMENT LEFT Result Date: 11/15/2023 CLINICAL DATA:  Evaluate COIL biopsy clip placement following ultrasound-guided LEFT breast biopsy. EXAM: 3D DIAGNOSTIC LEFT MAMMOGRAM POST ULTRASOUND BIOPSY COMPARISON:  Previous exam(s). ACR Breast Density Category b: There are scattered areas of fibroglandular density. FINDINGS: 3D Mammographic images were obtained following ultrasound guided biopsy of the 0.5 cm mass at the 12 o'clock position of the LEFT breast. The COIL biopsy marking clip is in expected position at the site of biopsy and corresponds to the mammographic abnormality. IMPRESSION: Appropriate positioning of the COIL shaped biopsy marking clip at the site of biopsy in the UPPER LEFT breast. Final Assessment: Post Procedure Mammograms for Marker Placement Electronically Signed   By: Reyes Phi M.D.   On: 11/15/2023 15:33   MM 3D DIAGNOSTIC MAMMOGRAM UNILATERAL LEFT BREAST Result Date: 11/15/2023 CLINICAL DATA:  Recall for possible LEFT breast asymmetry. EXAM: DIGITAL DIAGNOSTIC UNILATERAL LEFT MAMMOGRAM WITH TOMOSYNTHESIS AND CAD; ULTRASOUND LEFT BREAST LIMITED TECHNIQUE: Left digital diagnostic mammography and breast tomosynthesis was performed. The images were evaluated with computer-aided detection. ; Targeted ultrasound examination of the left breast was performed. COMPARISON:  Previous exam(s). ACR Breast Density Category b: There are  scattered areas of fibroglandular density. FINDINGS: LEFT: Mammogram: The asymmetry seen in the upper LEFT breast persist on additional spot compression and full field MLO views. Ultrasound: Targeted sonographic  evaluation of the upper LEFT breast demonstrates a hypoechoic oval mass with slightly angular margins measuring 0.5 x 0.2 x 0.4 cm at 12 o'clock 7 CMFN, possibly corresponding to the mammographic abnormality. No enlarged LEFT axillary lymph nodes are identified. IMPRESSION: Indeterminate 0.5 cm LEFT breast mass at 12 o'clock 7 CMFN, possibly corresponding to the mammographic abnormality seen in the upper LEFT breast. RECOMMENDATION: Ultrasound-guided core needle biopsy of a 0.5 cm LEFT breast mass (12 o'clock 7 CMFN). I have discussed the findings and recommendations with the patient. The biopsy procedure was explained to the patient and questions were answered. Patient expressed their understanding of the biopsy recommendation. Patient will be scheduled for biopsy at her earliest convenience by the schedulers. Ordering provider will be notified. If applicable, a reminder letter will be sent to the patient regarding the next appointment. BI-RADS CATEGORY  4: Suspicious. Electronically Signed   By: Aliene Lloyd M.D.   On: 11/15/2023 11:31   US  LIMITED ULTRASOUND INCLUDING AXILLA LEFT BREAST  Result Date: 11/15/2023 CLINICAL DATA:  Recall for possible LEFT breast asymmetry. EXAM: DIGITAL DIAGNOSTIC UNILATERAL LEFT MAMMOGRAM WITH TOMOSYNTHESIS AND CAD; ULTRASOUND LEFT BREAST LIMITED TECHNIQUE: Left digital diagnostic mammography and breast tomosynthesis was performed. The images were evaluated with computer-aided detection. ; Targeted ultrasound examination of the left breast was performed. COMPARISON:  Previous exam(s). ACR Breast Density Category b: There are scattered areas of fibroglandular density. FINDINGS: LEFT: Mammogram: The asymmetry seen in the upper LEFT breast persist on additional spot  compression and full field MLO views. Ultrasound: Targeted sonographic evaluation of the upper LEFT breast demonstrates a hypoechoic oval mass with slightly angular margins measuring 0.5 x 0.2 x 0.4 cm at 12 o'clock 7 CMFN, possibly corresponding to the mammographic abnormality. No enlarged LEFT axillary lymph nodes are identified. IMPRESSION: Indeterminate 0.5 cm LEFT breast mass at 12 o'clock 7 CMFN, possibly corresponding to the mammographic abnormality seen in the upper LEFT breast. RECOMMENDATION: Ultrasound-guided core needle biopsy of a 0.5 cm LEFT breast mass (12 o'clock 7 CMFN). I have discussed the findings and recommendations with the patient. The biopsy procedure was explained to the patient and questions were answered. Patient expressed their understanding of the biopsy recommendation. Patient will be scheduled for biopsy at her earliest convenience by the schedulers. Ordering provider will be notified. If applicable, a reminder letter will be sent to the patient regarding the next appointment. BI-RADS CATEGORY  4: Suspicious. Electronically Signed   By: Aliene Lloyd M.D.   On: 11/15/2023 11:31    Assessment & Plan:   Problem List Items Addressed This Visit     Coronary artery disease involving native coronary artery of native heart without angina pectoris   Relevant Orders   CBC with Differential/Platelet (Completed)   Comprehensive metabolic panel with GFR (Completed)   TSH (Completed)   Hemoglobin A1c (Completed)   Urinalysis (Completed)   Magnesium  (Completed)   Dyslipidemia, goal LDL below 70   Hypokalemia   Worse.  Restart spironolactone .  Take potassium 10 mEq tablets for a day for 2 days, then switch to 2 a day. Replenish low magnesium       Hypomagnesemia   New.  Will replenish magnesium  orally      Palpitations - Primary   ?PSVT Labs Zio patch x 2 weeks EKG, Heart CTA and other tests were reviewed Will need to correct hypomagnesemia and hypokalemia.  Restart  spironolactone .  Take potassium 10 mEq tablets for a day for 2 days, then switch to 2 a  day. Replenish low magnesium       Relevant Orders   CBC with Differential/Platelet (Completed)   Comprehensive metabolic panel with GFR (Completed)   TSH (Completed)   Hemoglobin A1c (Completed)   Urinalysis (Completed)   Magnesium  (Completed)   LONG TERM MONITOR (3-14 DAYS)   Type 2 diabetes mellitus with stage 3a chronic kidney disease, without long-term current use of insulin (HCC)   Relevant Orders   Hemoglobin A1c (Completed)      Meds ordered this encounter  Medications   Magnesium  Oxide 420 MG TABS    Sig: 1 po bid    Dispense:  60 tablet    Refill:  0      Follow-up: Return in about 2 weeks (around 04/25/2024) for f/u with PCP.  Marolyn Noel, MD

## 2024-04-11 NOTE — Telephone Encounter (Signed)
 CRITICAL VALUE STICKER  CRITICAL VALUE: Potassium 2.8  RECEIVER (on-site recipient of call): Dyjwpvlj @[facility]  DATE & TIME NOTIFIED: 04/11/24@425pm   MESSENGER (representative from lab):  MD NOTIFIED: Dr. Garald

## 2024-04-11 NOTE — Progress Notes (Unsigned)
 EP to read.

## 2024-04-11 NOTE — Assessment & Plan Note (Signed)
 Worse.  Restart spironolactone .  Take potassium 10 mEq tablets for a day for 2 days, then switch to 2 a day. Replenish low magnesium 

## 2024-04-11 NOTE — Telephone Encounter (Signed)
 CRITICAL VALUE STICKER  CRITICAL VALUE: Magnesium  0.6  RECEIVER (on-site recipient of call): Edsel Kerns, CMA  DATE & TIME NOTIFIED: 04/11/24@4 :43pm  MESSENGER (representative from lab): Dyjwpvlj [facility]  MD NOTIFIED: Dr. Joshua

## 2024-04-12 NOTE — Telephone Encounter (Signed)
 It was addressed yesterday.  Thank you

## 2024-04-28 ENCOUNTER — Other Ambulatory Visit: Payer: Self-pay

## 2024-04-28 ENCOUNTER — Emergency Department (HOSPITAL_BASED_OUTPATIENT_CLINIC_OR_DEPARTMENT_OTHER)

## 2024-04-28 ENCOUNTER — Emergency Department (HOSPITAL_BASED_OUTPATIENT_CLINIC_OR_DEPARTMENT_OTHER)
Admission: EM | Admit: 2024-04-28 | Discharge: 2024-04-28 | Disposition: A | Discharge status: Home / Self Care | Attending: Emergency Medicine | Admitting: Emergency Medicine

## 2024-04-28 DIAGNOSIS — M25562 Pain in left knee: Secondary | ICD-10-CM | POA: Diagnosis not present

## 2024-04-28 DIAGNOSIS — W01198A Fall on same level from slipping, tripping and stumbling with subsequent striking against other object, initial encounter: Secondary | ICD-10-CM | POA: Diagnosis not present

## 2024-04-28 DIAGNOSIS — I10 Essential (primary) hypertension: Secondary | ICD-10-CM | POA: Diagnosis not present

## 2024-04-28 DIAGNOSIS — I6782 Cerebral ischemia: Secondary | ICD-10-CM | POA: Diagnosis not present

## 2024-04-28 DIAGNOSIS — Z79899 Other long term (current) drug therapy: Secondary | ICD-10-CM | POA: Diagnosis not present

## 2024-04-28 DIAGNOSIS — R519 Headache, unspecified: Secondary | ICD-10-CM | POA: Diagnosis not present

## 2024-04-28 DIAGNOSIS — M25462 Effusion, left knee: Secondary | ICD-10-CM | POA: Diagnosis not present

## 2024-04-28 DIAGNOSIS — Z043 Encounter for examination and observation following other accident: Secondary | ICD-10-CM | POA: Diagnosis not present

## 2024-04-28 DIAGNOSIS — M542 Cervicalgia: Secondary | ICD-10-CM | POA: Insufficient documentation

## 2024-04-28 DIAGNOSIS — I6523 Occlusion and stenosis of bilateral carotid arteries: Secondary | ICD-10-CM | POA: Diagnosis not present

## 2024-04-28 DIAGNOSIS — W19XXXA Unspecified fall, initial encounter: Secondary | ICD-10-CM

## 2024-04-28 DIAGNOSIS — S199XXA Unspecified injury of neck, initial encounter: Secondary | ICD-10-CM | POA: Diagnosis not present

## 2024-04-28 DIAGNOSIS — M1712 Unilateral primary osteoarthritis, left knee: Secondary | ICD-10-CM | POA: Diagnosis not present

## 2024-04-28 DIAGNOSIS — S0990XA Unspecified injury of head, initial encounter: Secondary | ICD-10-CM | POA: Diagnosis not present

## 2024-04-28 NOTE — ED Triage Notes (Signed)
 Pt POV after mechanical fall, tripped over cord, hit head on carpet, no LOC, no thinners. Denies blurred vision, reporting L knee pain.

## 2024-04-28 NOTE — Discharge Instructions (Addendum)
 You were evaluated in the emergency room following a fall.  Your imaging did not show any abnormality.  You were fitted with a knee brace.  You were provided a referral for orthopedics.  Should your symptoms persist please call make an appointment.

## 2024-04-28 NOTE — ED Provider Notes (Signed)
 [facility] AT [facility] Provider Note   CSN: [id number] Arrival date & time: 04/28/24  1515     Patient presents with: [name] Druscilla is a 78 y.o. female with past medical history as listed below presents following mechanical ground-level fall.  Patient states she tripped on a cord and fell out and landed on her left knee and did hit her head.  No LOC.  Has had no vomiting.  Denies significant headache, vision changes, extremity weakness or numbness.  She has been able to ambulate since.  Denies any preceding chest pain, shortness of breath or dizziness.  She is not on blood thinners.  Daughter at bedside reports that she has been behaving at baseline.  She is primarily complaining of some anterior left knee pain.    Fall   Past Medical History:  Diagnosis Date   Arthritis    KNEES   Blood transfusion without reported diagnosis    Cataract    CTS (carpal tunnel syndrome)    BILATERAL LEFT > RIGHT (OCC. HAND NUMBNESS)   Female cystocele    GERD (gastroesophageal reflux disease)    H/O hiatal hernia    History of cervical dysplasia    CIN II   History of peritonitis    2006--  S/P PERFORATED BOWEL INJURY DURING SURGERY   Hyperlipidemia    Hypertension    Past Surgical History:  Procedure Laterality Date   ABDOMINAL HYSTERECTOMY  1991   W/ BILATERAL SALPINGOOPHORECTOMY   BREAST BIOPSY Right 2014   BREAST BIOPSY Left 11/15/2023   US  LT BREAST BX W LOC DEV 1ST LESION IMG BX SPEC US  GUIDE 11/15/2023 [facility]   LAPAROSCOPIC CHOLECYSTECTOMY  2006   W/ PERFERATED BOWEL (INJURY)   NASAL SEPTUM SURGERY  70's   REPAIR DEVIATED SEPTUM   sepsis after gallbladder surgery     TUBAL LIGATION  1980'S   VAGINAL PROLAPSE REPAIR N/A 08/15/2012   Procedure: VAGINAL VAULT SUSPENSION;  Surgeon: Arlena LILLETTE Gal, MD;  Location: [facility];  Service: Urology;  Laterality: N/A;  ANTERIOR VAULT REPAIR WITH XENFORE        Prior to Admission medications   Medication Sig Start Date End Date Taking? Authorizing Provider  alum & mag hydroxide-simeth (MAALOX/MYLANTA) 200-200-20 MG/5ML suspension Take 15 mLs by mouth as needed for indigestion or heartburn.    [provider]  benzonatate  (TESSALON ) 200 MG capsule Take 1 capsule (200 mg total) by mouth 3 (three) times daily as needed for cough. 02/29/24   Geofm Glade PARAS, MD  esomeprazole  (NEXIUM ) 40 MG capsule Take 1 capsule (40 mg total) by mouth daily. 01/26/24   Joshua Debby LITTIE, MD  estradiol  (ESTRACE ) 0.1 MG/GM vaginal cream Place 1 Applicatorful vaginally 3 (three) times a week. 11/09/22   Eldonna Suzen Octave, MD  famotidine  (PEPCID ) 20 MG tablet Take 1 tablet (20 mg total) by mouth 2 (two) times daily. 01/26/24   Joshua Debby LITTIE, MD  fluticasone  (FLONASE ) 50 MCG/ACT nasal spray Place 2 sprays into both nostrils daily. 02/22/23   Rollene Almarie LABOR, MD  JARDIANCE  10 MG TABS tablet Take 10 mg by mouth every morning. 02/25/24   [provider]  loratadine  (CLARITIN ) 10 MG tablet Take 1 tablet (10 mg total) by mouth daily. 10/11/23   Lendia Nordmann L, NP-C  Magnesium  Oxide 420 MG TABS 1 po bid 04/11/24   Plotnikov, Aleksei V, MD  Multiple Vitamin (MULTIVITAMIN) tablet Take 1 tablet by mouth  3 (three) times a week.    [provider]  potassium chloride  (KLOR-CON  10) 10 MEQ tablet Take 1 tablet (10 mEq total) by mouth 2 (two) times daily. 03/09/24   Joshua Debby CROME, MD  Probiotic Product (PROBIOTIC-10 ULTIMATE) CAPS Take by mouth daily at 6 (six) AM.    [provider]  psyllium (METAMUCIL) 58.6 % packet Take 1 packet by mouth daily.    [provider]  spironolactone  (ALDACTONE ) 25 MG tablet Take 1 tablet (25 mg total) by mouth daily. 01/26/24   Joshua Debby CROME, MD  tiZANidine  (ZANAFLEX ) 2 MG tablet Take 1-2 tablets (2-4 mg total) by mouth every 8 (eight) hours as needed. 11/14/23   Joane Artist RAMAN, MD  torsemide  (DEMADEX ) 10 MG  tablet  02/19/24   [provider]  VITAMIN D PO Take 1 tablet by mouth daily.    [provider]  Wheat Dextrin (BENEFIBER DRINK MIX) PACK Take by mouth as needed.    [provider]    Allergies: Zocor  [simvastatin ], Advil [ibuprofen], Bran [wheat], Hydrocodone , Hydrocodone -acetaminophen , Metoprolol  tartrate, Hydrochlorothiazide , Lipitor [atorvastatin], and Pravachol [pravastatin sodium]    Review of Systems  Musculoskeletal:  Positive for myalgias.    Updated Vital Signs BP (!) 137/98 (BP Location: Left Arm)   Pulse 78   Temp 98.2 F (36.8 C) (Oral)   Resp 17   Ht 5' (1.524 m)   Wt 58.5 kg   SpO2 99%   BMI 25.19 kg/m   Physical Exam Vitals and nursing note reviewed.  Constitutional:      General: She is not in acute distress.    Appearance: She is well-developed.  HENT:     Head: Normocephalic and atraumatic.  Eyes:     Conjunctiva/sclera: Conjunctivae normal.  Cardiovascular:     Rate and Rhythm: Normal rate and regular rhythm.     Heart sounds: No murmur heard. Pulmonary:     Effort: Pulmonary effort is normal. No respiratory distress.     Breath sounds: Normal breath sounds.  Abdominal:     Palpations: Abdomen is soft.     Tenderness: There is no abdominal tenderness.  Musculoskeletal:        General: Swelling present.     Cervical back: Neck supple.     Comments: Mild anterior knee tenderness, mild associated swelling, no ecchymosis, knee is stable to varus and valgus.  Tolerates full range of motion.  Pulses 2+  Mild generalized cervical tenderness.  Skin:    General: Skin is warm and dry.     Capillary Refill: Capillary refill takes less than 2 seconds.  Neurological:     Mental Status: She is alert.  Psychiatric:        Mood and Affect: Mood normal.     (all labs ordered are listed, but only abnormal results are displayed) Labs Reviewed - No data to display  EKG: None  Radiology: CT Cervical Spine Wo Contrast Result  Date: 04/28/2024 EXAM: CT Cervical Spine Without Contrast 04/28/2024 06:38:06 PM TECHNIQUE: CT of the cervical spine was performed without the administration of intravenous contrast. Multiplanar reformatted images are provided for review. Automated exposure control, iterative reconstruction, and/or weight based adjustment of the mA/kV was utilized to reduce the radiation dose to as low as reasonably achievable. COMPARISON: None available. CLINICAL HISTORY: Neck trauma (Age >= 65y) Neck trauma (Age >= 65y) FINDINGS: BONES AND ALIGNMENT: No acute fracture or traumatic malalignment. DEGENERATIVE CHANGES: No significant degenerative changes. SOFT TISSUES: No prevertebral soft  tissue swelling. IMPRESSION: 1. No acute abnormality of the cervical spine. Electronically signed by: Franky Stanford MD 04/28/2024 06:49 PM EDT RP Workstation: [id number]   CT Head Wo Contrast Result Date: 04/28/2024 EXAM: CT HEAD WITHOUT CONTRAST 04/28/2024 06:38:06 PM TECHNIQUE: CT of the head was performed without the administration of intravenous contrast. Automated exposure control, iterative reconstruction, and/or weight based adjustment of the mA/kV was utilized to reduce the radiation dose to as low as reasonably achievable. COMPARISON: 12/06/2017 CLINICAL HISTORY: Head trauma, minor (Age >= 65y). FINDINGS: BRAIN AND VENTRICLES: Patchy and confluent supratentorial white matter hypodensity, likely reflecting moderate chronic small-vessel ischemic change. 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 No acute hemorrhage. No evidence of acute infarct. No hydrocephalus. No extra-axial collection. No mass effect or midline shift. ORBITS: Bilateral lens implants in place. SINUSES: No acute abnormality. SOFT TISSUES AND SKULL: Bilateral carotid siphon calcification. No acute soft tissue abnormality. No skull fracture. IMPRESSION: 1. No acute intracranial abnormality. 2. 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 Electronically signed by: Franky Stanford MD 04/28/2024 06:48 PM EDT RP Workstation: [id number]   DG Knee Complete 4 Views Left Result Date: 04/28/2024 EXAM: 4 VIEW(S) XRAY OF THE LEFT KNEE 04/28/2024 06:20:06 PM COMPARISON: None available. CLINICAL HISTORY: fall fall FINDINGS: BONES AND JOINTS: No acute fracture. No focal osseous lesion. No joint dislocation. No significant joint effusion. Mild patellar spurring. Vascular calcifications are noted. Mild narrowing and osteophyte formation of the lateral joint space is noted. SOFT TISSUES: The soft tissues are unremarkable. IMPRESSION: 1. No acute fracture or dislocation. 2. Mild lateral compartment osteoarthritis. 3. Vascular calcifications. Electronically signed by: Lynwood Seip MD 04/28/2024 06:24 PM EDT RP Workstation: [id number]     Procedures   Medications Ordered in the ED - No data to display  Clinical Course as of 04/28/24 2008  Fri Apr 28, 2024  1839 Patient evaluated following ground-level mechanical fall hitting her left knee and head.  Upon arrival she is hemodynamically stable.  She does have some anterior knee tenderness and some mild swelling.  She does tolerate full range of motion of the knee without discomfort.  Her head is normocephalic atraumatic.  She has some mild generalized cervical tenderness.  She has no significant concerning neurological symptoms and no neurodeficits on exam.  Knee x-ray ordered in triage.  Will add on CT head and cervical spine. [JT]  1841 DG Knee Complete 4 Views Left No acute fracture, arthritic changes noted [JT]  1920 CT Head Wo Contrast No acute findings of CT head and cervical spine [JT]  1921 Workup is overall reassuring.  Patient will be discharged home.  Will provide knee brace. [JT]    Clinical Course User Index [JT] Kimberly Lynwood DEL, PA-C                                 Medical Decision Making Amount and/or Complexity of Data Reviewed Radiology: ordered.   This patient  presents to the ED with chief complaint(s) of fall .  The complaint involves an extensive differential diagnosis and also carries with it a high risk of complications and morbidity.   Pertinent past medical history as listed in HPI  The differential diagnosis includes  No evidence of fracture, dislocation or subarachnoid hemorrhage on imaging.    Additional history obtained: Records reviewed Care Everywhere/External Records  Disposition:   Patient will be discharged home. The patient has been appropriately  medically screened and/or stabilized in the ED. I have low suspicion for any other emergent medical condition which would require further screening, evaluation or treatment in the ED or require inpatient management. At time of discharge the patient is hemodynamically stable and in no acute distress. I have discussed work-up results and diagnosis with patient and answered all questions. Patient is agreeable with discharge plan. We discussed strict return precautions for returning to the emergency department and they verbalized understanding.     Social Determinants of Health:   none  This note was dictated with voice recognition software.  Despite best efforts at proofreading, errors may have occurred which can change the documentation meaning.       Final diagnoses:  Fall, initial encounter    ED Discharge Orders     None          Kimberly Lynwood VEAR DEVONNA 04/28/24 2008    Kimberly Hamilton, MD 04/30/24 [phone]

## 2024-05-03 ENCOUNTER — Encounter: Payer: Self-pay | Admitting: Internal Medicine

## 2024-05-03 ENCOUNTER — Ambulatory Visit (INDEPENDENT_AMBULATORY_CARE_PROVIDER_SITE_OTHER): Admitting: Internal Medicine

## 2024-05-03 VITALS — BP 128/78 | HR 94 | Temp 98.7°F | Ht 60.0 in | Wt 124.2 lb

## 2024-05-03 DIAGNOSIS — E876 Hypokalemia: Secondary | ICD-10-CM

## 2024-05-03 DIAGNOSIS — E785 Hyperlipidemia, unspecified: Secondary | ICD-10-CM | POA: Diagnosis not present

## 2024-05-03 DIAGNOSIS — I10 Essential (primary) hypertension: Secondary | ICD-10-CM | POA: Diagnosis not present

## 2024-05-03 MED ORDER — EZETIMIBE 10 MG PO TABS
10.0000 mg | ORAL_TABLET | Freq: Every day | ORAL | 1 refills | Status: AC
Start: 2024-05-03 — End: ?

## 2024-05-03 NOTE — Patient Instructions (Signed)
Ezetimibe Tablets What is this medication? EZETIMIBE (ez ET i mibe) treats high cholesterol. It works by reducing the amount of cholesterol absorbed from the food you eat. This decreases the amount of bad cholesterol (such as LDL) in your blood. Changes to diet and exercise are often combined with this medication. This medicine may be used for other purposes; ask your health care provider or pharmacist if you have questions. COMMON BRAND NAME(S): Zetia What should I tell my care team before I take this medication? They need to know if you have any of these conditions: Kidney disease Liver disease Muscle cramps, pain Muscle injury Thyroid disease An unusual or allergic reaction to ezetimibe, other medications, foods, dyes, or preservatives Pregnant or trying to get pregnant Breast-feeding How should I use this medication? Take this medication by mouth. Take it as directed on the prescription label at the same time every day. You can take it with or without food. If it upsets your stomach, take it with food. Keep taking it unless your care team tells you to stop. Take bile acid sequestrants at a different time of day than this medication. Take this medication 2 hours BEFORE or 4 hours AFTER bile acid sequestrants. Talk to your care team about the use of this medication in children. While it may be prescribed for children as young as 10 for selected conditions, precautions do apply. Overdosage: If you think you have taken too much of this medicine contact a poison control center or emergency room at once. NOTE: This medicine is only for you. Do not share this medicine with others. What if I miss a dose? If you miss a dose, take it as soon as you can. If it is almost time for your next dose, take only that dose. Do not take double or extra doses. What may interact with this medication? Do not take this medication with any of the following: Fenofibrate Gemfibrozil This medication may also  interact with the following: Antacids Cyclosporine Herbal medications like red yeast rice Other medications to lower cholesterol or triglycerides This list may not describe all possible interactions. Give your health care provider a list of all the medicines, herbs, non-prescription drugs, or dietary supplements you use. Also tell them if you smoke, drink alcohol, or use illegal drugs. Some items may interact with your medicine. What should I watch for while using this medication? Visit your care team for regular checks on your progress. Tell your care team if your symptoms do not start to get better or if they get worse. Your care team may tell you to stop taking this medication if you develop muscle problems. If your muscle problems do not go away after stopping this medication, contact your care team. Talk to your care team if you wish to become pregnant or think you might be pregnant. This medication can cause serious birth defects if taken during pregnancy. Talk to your care team before breastfeeding. Changes to your treatment plan may be needed. Taking this medication is only part of a total heart healthy program. Ask your care team if there are other changes you can make to improve your overall health. What side effects may I notice from receiving this medication? Side effects that you should report to your doctor or health care provider as soon as possible: Allergic reactions--skin rash, itching or hives, swelling of the face, lips, tongue, or throat Side effects that usually do not require medical attention (report to your doctor or health care provider if  they continue or are bothersome): Diarrhea Joint pain This list may not describe all possible side effects. Call your doctor for medical advice about side effects. You may report side effects to FDA at 1-800-FDA-1088. Where should I keep my medication? Keep out of the reach of children and pets. Store at room temperature between 15  and 30 degrees C (59 and 86 degrees F). Protect from moisture. Get rid of any unused medication after the expiration date. NOTE: This sheet is a summary. It may not cover all possible information. If you have questions about this medicine, talk to your doctor, pharmacist, or health care provider.  2024 Elsevier/Gold Standard (2022-03-24 00:00:00)

## 2024-05-03 NOTE — Progress Notes (Signed)
 Subjective:  Patient ID: [name], female    DOB: [birth year]  Age: 78 y.o. MRN: [id number]  CC: 2 Week Follow-up (Patient seen Dr. Harley for pounding in her chest and she had to wear a heart monitor. )   HPI [name] presents for f/up ----  Discussed the use of AI scribe software for clinical note transcription with the patient, who gave verbal consent to proceed.  History of Present Illness [name] is a 78 year old female who presents with heart palpitations and concerns about medication side effects.  She experiences heart palpitations described as a 'thumping' sensation in her chest and an audible heartbeat in her ears. She recently completed a heart monitor test. She stopped taking spironolactone  three days ago, suspecting it may be causing these symptoms. She is also concerned about potential cancer risks associated with spironolactone .  She denies weakness, dizziness, or lightheadedness. She fell on Friday after getting her foot tangled in a cord, resulting in a bump on her head and a sore knee. She visited the emergency room after her fall. She was given a knee brace, which she finds too large and cumbersome. No nausea, vomiting, numbness, weakness, tingling, trouble speaking, or feeling off balance.  Her blood pressure has been stable in the last few days. She is not currently taking any medication for cholesterol after discontinuing a previous treatment. She is taking potassium supplements and spaces them out from spironolactone . No cramping or swelling, although her right knee, which she fell on, is still slightly sore and occasionally feels like it might give out. She has an upcoming appointment with an orthopedic doctor.     Outpatient Medications Prior to Visit  Medication Sig Dispense Refill   alum & mag hydroxide-simeth (MAALOX/MYLANTA) 200-200-20 MG/5ML suspension Take 15 mLs by mouth as needed for indigestion or heartburn.     esomeprazole  (NEXIUM ) 40 MG  capsule Take 1 capsule (40 mg total) by mouth daily. 90 capsule 1   estradiol  (ESTRACE ) 0.1 MG/GM vaginal cream Place 1 Applicatorful vaginally 3 (three) times a week. 42.5 g 0   famotidine  (PEPCID ) 20 MG tablet Take 1 tablet (20 mg total) by mouth 2 (two) times daily. 180 tablet 1   fluticasone  (FLONASE ) 50 MCG/ACT nasal spray Place 2 sprays into both nostrils daily. 48 mL 3   JARDIANCE  10 MG TABS tablet Take 10 mg by mouth every morning.     loratadine  (CLARITIN ) 10 MG tablet Take 1 tablet (10 mg total) by mouth daily. 30 tablet 1   Magnesium  Oxide 420 MG TABS 1 po bid 60 tablet 0   Multiple Vitamin (MULTIVITAMIN) tablet Take 1 tablet by mouth 3 (three) times a week.     potassium chloride  (KLOR-CON  10) 10 MEQ tablet Take 1 tablet (10 mEq total) by mouth 2 (two) times daily. 180 tablet 0   Probiotic Product (PROBIOTIC-10 ULTIMATE) CAPS Take by mouth daily at 6 (six) AM.     psyllium (METAMUCIL) 58.6 % packet Take 1 packet by mouth daily.     spironolactone  (ALDACTONE ) 25 MG tablet Take 1 tablet (25 mg total) by mouth daily. 90 tablet 1   tiZANidine  (ZANAFLEX ) 2 MG tablet Take 1-2 tablets (2-4 mg total) by mouth every 8 (eight) hours as needed. 60 tablet 1   torsemide  (DEMADEX ) 10 MG tablet      VITAMIN D PO Take 1 tablet by mouth daily.     Wheat Dextrin (BENEFIBER DRINK MIX) PACK Take by mouth  as needed.     benzonatate  (TESSALON ) 200 MG capsule Take 1 capsule (200 mg total) by mouth 3 (three) times daily as needed for cough. 30 capsule 0   No facility-administered medications prior to visit.    ROS Review of Systems  Objective:  BP 128/78 (BP Location: Left Arm, Patient Position: Sitting, Cuff Size: Normal)   Pulse 94   Temp 98.7 F (37.1 C) (Oral)   Ht 5' (1.524 m)   Wt 124 lb 3.2 oz (56.3 kg)   SpO2 98%   BMI 24.26 kg/m   BP Readings from Last 3 Encounters:  05/03/24 128/78  04/28/24 (!) 137/98  04/11/24 120/70    Wt Readings from Last 3 Encounters:  05/03/24 124 lb  3.2 oz (56.3 kg)  04/28/24 129 lb (58.5 kg)  04/11/24 127 lb 3.2 oz (57.7 kg)    Physical Exam  Lab Results  Component Value Date   WBC 4.4 04/11/2024   HGB 13.4 04/11/2024   HCT 40.3 04/11/2024   PLT 287.0 04/11/2024   GLUCOSE 90 04/11/2024   CHOL 174 11/11/2023   TRIG 76 11/11/2023   HDL 68 11/11/2023   LDLDIRECT 124 (H) 07/10/2022   LDLCALC 92 11/11/2023   ALT 23 04/11/2024   AST 27 04/11/2024   NA 143 04/11/2024   K 2.8 (LL) 04/11/2024   CL 100 04/11/2024   CREATININE 0.70 04/11/2024   BUN 9 04/11/2024   CO2 35 (H) 04/11/2024   TSH 1.98 04/11/2024   HGBA1C 5.3 04/11/2024    CT Cervical Spine Wo Contrast Result Date: 04/28/2024 EXAM: CT Cervical Spine Without Contrast 04/28/2024 06:38:06 PM TECHNIQUE: CT of the cervical spine was performed without the administration of intravenous contrast. Multiplanar reformatted images are provided for review. Automated exposure control, iterative reconstruction, and/or weight based adjustment of the mA/kV was utilized to reduce the radiation dose to as low as reasonably achievable. COMPARISON: None available. CLINICAL HISTORY: Neck trauma (Age >= 65y) Neck trauma (Age >= 65y) FINDINGS: BONES AND ALIGNMENT: No acute fracture or traumatic malalignment. DEGENERATIVE CHANGES: No significant degenerative changes. SOFT TISSUES: No prevertebral soft tissue swelling. IMPRESSION: 1. No acute abnormality of the cervical spine. Electronically signed by: Franky Stanford MD 04/28/2024 06:49 PM EDT RP Workstation: [id number]   CT Head Wo Contrast Result Date: 04/28/2024 EXAM: CT HEAD WITHOUT CONTRAST 04/28/2024 06:38:06 PM TECHNIQUE: CT of the head was performed without the administration of intravenous contrast. Automated exposure control, iterative reconstruction, and/or weight based adjustment of the mA/kV was utilized to reduce the radiation dose to as low as reasonably achievable. COMPARISON: 12/06/2017 CLINICAL HISTORY: Head trauma, minor (Age >= 65y).  FINDINGS: BRAIN AND VENTRICLES: Patchy and confluent supratentorial white matter hypodensity, likely reflecting moderate chronic small-vessel ischemic change. 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 No acute hemorrhage. No evidence of acute infarct. No hydrocephalus. No extra-axial collection. No mass effect or midline shift. ORBITS: Bilateral lens implants in place. SINUSES: No acute abnormality. SOFT TISSUES AND SKULL: Bilateral carotid siphon calcification. No acute soft tissue abnormality. No skull fracture. IMPRESSION: 1. No acute intracranial abnormality. 2. 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 Electronically signed by: Franky Stanford MD 04/28/2024 06:48 PM EDT RP Workstation: [id number]   DG Knee Complete 4 Views Left Result Date: 04/28/2024 EXAM: 4 VIEW(S) XRAY OF THE LEFT KNEE 04/28/2024 06:20:06 PM COMPARISON: None available. CLINICAL HISTORY: fall fall FINDINGS: BONES AND JOINTS: No acute fracture. No focal osseous lesion. No joint dislocation. No significant joint  effusion. Mild patellar spurring. Vascular calcifications are noted. Mild narrowing and osteophyte formation of the lateral joint space is noted. SOFT TISSUES: The soft tissues are unremarkable. IMPRESSION: 1. No acute fracture or dislocation. 2. Mild lateral compartment osteoarthritis. 3. Vascular calcifications. Electronically signed by: Lynwood Seip MD 04/28/2024 06:24 PM EDT RP Workstation: [id number]    Assessment & Plan:  Essential hypertension  Dyslipidemia, goal LDL below 70 -     Ezetimibe ; Take 1 tablet (10 mg total) by mouth daily.  Dispense: 90 tablet; Refill: 1  Hypokalemia     Follow-up: Return in about 6 months (around 10/31/2024).  Debby Molt, MD

## 2024-05-04 DIAGNOSIS — R002 Palpitations: Secondary | ICD-10-CM

## 2024-05-05 ENCOUNTER — Ambulatory Visit (INDEPENDENT_AMBULATORY_CARE_PROVIDER_SITE_OTHER): Admitting: Podiatry

## 2024-05-05 ENCOUNTER — Encounter: Payer: Self-pay | Admitting: Podiatry

## 2024-05-05 DIAGNOSIS — B351 Tinea unguium: Secondary | ICD-10-CM

## 2024-05-05 DIAGNOSIS — M79675 Pain in left toe(s): Secondary | ICD-10-CM

## 2024-05-05 DIAGNOSIS — M79674 Pain in right toe(s): Secondary | ICD-10-CM

## 2024-05-05 DIAGNOSIS — L603 Nail dystrophy: Secondary | ICD-10-CM

## 2024-05-05 NOTE — Progress Notes (Signed)
This patient presents to the office with chief complaint of long thick painful nails.  Patient says the nails are painful walking and wearing shoes.  This patient is unable to self treat.  This patient is unable to trim her nails since she is unable to reach her nails.  She presents to the office for preventative foot care services. ? ?General Appearance  Alert, conversant and in no acute stress. ? ?Vascular  Dorsalis pedis and posterior tibial  pulses are palpable  bilaterally.  Capillary return is within normal limits  bilaterally. Temperature is within normal limits  bilaterally. ? ?Neurologic  Senn-Weinstein monofilament wire test within normal limits  bilaterally. Muscle power within normal limits bilaterally. ? ?Nails Thick disfigured discolored nails with subungual debris  hallux nails  bilaterally. No evidence of bacterial infection or drainage bilaterally. ? ?Orthopedic  No limitations of motion  feet .  No crepitus or effusions noted.  No bony pathology or digital deformities noted. ? ?Skin  normotropic skin with no porokeratosis noted bilaterally.  No signs of infections or ulcers noted.    ? ?Onychomycosis  Nails  B/L.  Pain in right toes  Pain in left toes ? ?Debridement of nails both feet followed trimming the nails with dremel tool.   RTC 3 months. ? ? ?[name] DPM   ?

## 2024-05-09 ENCOUNTER — Ambulatory Visit: Admitting: Internal Medicine

## 2024-05-10 ENCOUNTER — Ambulatory Visit (INDEPENDENT_AMBULATORY_CARE_PROVIDER_SITE_OTHER)

## 2024-05-10 ENCOUNTER — Other Ambulatory Visit: Payer: Self-pay

## 2024-05-10 ENCOUNTER — Ambulatory Visit (INDEPENDENT_AMBULATORY_CARE_PROVIDER_SITE_OTHER): Admitting: Family Medicine

## 2024-05-10 VITALS — BP 146/88 | HR 84 | Ht 60.0 in | Wt 127.0 lb

## 2024-05-10 DIAGNOSIS — M170 Bilateral primary osteoarthritis of knee: Secondary | ICD-10-CM | POA: Diagnosis not present

## 2024-05-10 DIAGNOSIS — G8929 Other chronic pain: Secondary | ICD-10-CM

## 2024-05-10 DIAGNOSIS — M25561 Pain in right knee: Secondary | ICD-10-CM | POA: Diagnosis not present

## 2024-05-10 DIAGNOSIS — M25562 Pain in left knee: Secondary | ICD-10-CM | POA: Diagnosis not present

## 2024-05-10 DIAGNOSIS — M25461 Effusion, right knee: Secondary | ICD-10-CM | POA: Diagnosis not present

## 2024-05-10 NOTE — Progress Notes (Signed)
 Kimberly Ileana Collet, PhD, LAT, ATC acting as a scribe for Artist Lloyd, MD.  [name] is a 78 y.o. female who presents to [facility] at [facility] today for L knee pain. Pt was previously seen by Dr. Lloyd on 01/26/24 for R knee pain.  Today, pt c/o L knee pain ongoing since 10/31. Pt tripped on a cord and fell, landing on her L knee. She was seen at the ED following the fall go further evaluate the knot on her head/face. Pt locates pain to all over the knee joints.  L Knee swelling: no Mechanical symptoms: no, R knee yes Aggravates: walking, stairs, transitioning to stand Treatments tried: home exercises,   Dx testing: 04/28/24 L knee XR  Pertinent review of systems: No fevers or chills  Relevant historical information: Diabetes. Patient experience hyperglycemia with prior steroid injection in her knee.   Exam:  BP (!) 146/88   Pulse 84   Ht 5' (1.524 m)   Wt 127 lb (57.6 kg)   SpO2 96%   BMI 24.80 kg/m  General: Well Developed, well nourished, and in no acute distress.   MSK: Knees bilaterally some bruising anterior left knee with some swelling.  Otherwise unremarkable.. Normal motion. Not particularly tender to palpation. Intact strength.    Lab and Radiology Results  X-ray images right knee obtained today personally and independently interpreted. Moderate lateral DJD.  No acute fractures are visible. Await formal radiology review   EXAM: 4 VIEW(S) XRAY OF THE LEFT KNEE 04/28/2024 06:20:06 PM   COMPARISON: None available.   CLINICAL HISTORY: fall fall   FINDINGS:   BONES AND JOINTS: No acute fracture. No focal osseous lesion. No joint dislocation. No significant joint effusion. Mild patellar spurring. Vascular calcifications are noted. Mild narrowing and osteophyte formation of the lateral joint space is noted.   SOFT TISSUES: The soft tissues are unremarkable.   IMPRESSION: 1. No acute fracture or dislocation. 2. Mild  lateral compartment osteoarthritis. 3. Vascular calcifications.   Electronically signed by: Lynwood Seip MD 04/28/2024 06:24 PM EDT RP Workstation: [id number] I, Artist [name], personally (independently) visualized and performed the interpretation of the images attached in this note.   Assessment and Plan: 78 y.o. female with bilateral knee pain worse after a fall.  Patient does have existing DJD and the fall would cause contusion and exacerbation of degenerative changes.  Overall she is doing okay.  Plan for Voltaren gel.  She already has had a trial of physical therapy in the past and wants to resume home exercise program previously taught.  Next step would potentially be hyaluronic acid injections and/or physical therapy.  She would like to avoid steroid injections as it caused hyperglycemia in the past.  She will let us  know if she would like us  to work on authorization for gel shots in the future.   PDMP not reviewed this encounter. Orders Placed This Encounter  Procedures   DG Knee AP/LAT W/Sunrise Right    Standing Status:   Future    Number of Occurrences:   1    Expiration Date:   06/09/2024    Reason for Exam (SYMPTOM  OR DIAGNOSIS REQUIRED):   right knee pain    Preferred imaging location?:   [place]   No orders of the defined types were placed in this encounter.    Discussed warning signs or symptoms. Please see discharge instructions. Patient expresses understanding.   The above documentation has been reviewed and is  accurate and complete Artist [name], M.D.

## 2024-05-10 NOTE — Patient Instructions (Addendum)
 Thank you for coming in today.   Please use Voltaren gel (Generic Diclofenac Gel) up to 4x daily for pain as needed.  This is available over-the-counter as both the name brand Voltaren gel and the generic diclofenac gel.   Please get an Xray today before you leave   Let us  know if you would like us  to authorize the gel shots for you.

## 2024-05-11 ENCOUNTER — Ambulatory Visit: Payer: Self-pay | Admitting: Family Medicine

## 2024-05-11 NOTE — Progress Notes (Signed)
Right knee x-ray shows some arthritis.

## 2024-05-15 ENCOUNTER — Encounter (INDEPENDENT_AMBULATORY_CARE_PROVIDER_SITE_OTHER): Payer: Self-pay | Admitting: Otolaryngology

## 2024-05-15 ENCOUNTER — Ambulatory Visit (INDEPENDENT_AMBULATORY_CARE_PROVIDER_SITE_OTHER): Admitting: Otolaryngology

## 2024-05-15 VITALS — BP 131/79 | HR 93 | Ht 60.0 in | Wt 125.0 lb

## 2024-05-15 DIAGNOSIS — M26622 Arthralgia of left temporomandibular joint: Secondary | ICD-10-CM

## 2024-05-15 DIAGNOSIS — H903 Sensorineural hearing loss, bilateral: Secondary | ICD-10-CM

## 2024-05-15 DIAGNOSIS — H9313 Tinnitus, bilateral: Secondary | ICD-10-CM | POA: Diagnosis not present

## 2024-05-15 DIAGNOSIS — R519 Headache, unspecified: Secondary | ICD-10-CM

## 2024-05-15 NOTE — Progress Notes (Signed)
 Dear Dr. Joshua, Here is my assessment for our mutual patient, [name]. Thank you for allowing me the opportunity to care for your patient. Please do not hesitate to contact me should you have any other questions. Sincerely, Dr. Eldora Blanch  Otolaryngology Clinic Note Referring provider: Dr. Joshua HPI:  [name] is a 78 y.o. female kindly referred by Dr. Joshua for evaluation of bilateral hearing loss and tinnitus  Initial visit (11/2023): Patient reports: bilateral tinnitus for over a year, prior was intermittent. Prior seen by Dr. Carlie. Intermittent popping and feeling stopped up, some hearing loss. She did have an audio in March 2025. Does not bother her when she sleeps. When there are distractions, does not notice tinnitus as much. No recent medication changes, no significant neck pain. Not on ASA. No significant noise exposure. No antecedent event. Perhaps hearing declining some.  Patient denies: ear pain, fullness, vertigo, drainage, Patient additionally denies: deep pain in ear canal, eustachian tube symptoms such as consistent popping/crackling, sensitive to pressure changes Patient also denies barotrauma, vestibular suppressant use, ototoxic medication use Prior ear surgery: no  -------------------------------------------------------- 05/15/2024 Seen in follow up for different complaint. She reports that around August, she started to have fairly significant discomfort around left jaw. She went to a Dentist, and then the Endodontist who prescribed antibiotics. She reports that the pain has persisted, but has not resolved. Pain is worse when she chews, opens the mouth. Pain has improved overall, radiates to temporal area and down her jaw. This all started after she opened her mouth and heard a pop - felt like her joint popped. No significant ear pain. No swelling over parotid area. No dental pain. Not aware of bruxism. No pain or swelling with eating. Patient otherwise denies: -  dysphagia, odynophagia, unintentional weight loss - changes in voice, shortness of breath, hemoptysis - neck masses Allergic rhinitis/PND: resolved  H&N Surgery: septoplasty (70s?) Personal or FHx of bleeding dz or anesthesia difficulty: no  AP/AC: no  Tobacco: no  PMHx: HTN, CAD, GERD, PreDM  Independent Review of Additional Tests or Records:  CT Head 2019 independently interpreted with respect to ears :mastoids and ME well aerated; cuts thick so suboptimal eval but no ossicular chain or otic capsule abnormalities noted Dr. Joshua (09/14/2023): year long h/o tinnitus, hearing loss noted; Dx: SNHL, Tinnitus; Rx: ref to ENT and audio Dr. Carlie 09/10/2022: noted tinnitus, px since Oct 2023. Suspect related to BP medication; noted b/l SNHL, WRT 84% AU; Dx: Tinnitus and SNHL; Rx: masking, consider HA Darryle Posey (AuD) 08/2023 audio reviewed: A/A tymps; mild to moderate sensorineural hearing loss, bilaterally. Speech Recognition Thresholds were obtained at 40 dB HL in the right ear and at 35 dB HL in the left ear. Word Recognition Testing was completed at 70 dB HL and Shatika scored 88% in the right ear and 100% in the left ear.    Dr. Norleen (01/07/2024): worsening allergy symptoms -- congestion, itching, sneezing; some hoarseness; Dx: Allergic rhinitis; hoarseness; Rx: steroids, zyrtec Labs CBC and CMP 04/11/2024: WBC 4.4, Eos 0; K2.8, BUN/Cr 9/0.7 CTH 04/28/2024 independently interpreted: suboptimal eval given no contrast for any glands/soft tissue but sinuses well aerated and mastoid and ME well aerated; moderate motion artifact; visualized parotid without nodules PMH/Meds/All/SocHx/FamHx/ROS:   Past Medical History:  Diagnosis Date   Arthritis    KNEES   Blood transfusion without reported diagnosis    Cataract    CTS (carpal tunnel syndrome)    BILATERAL LEFT > RIGHT (OCC. HAND  NUMBNESS)   Female cystocele    GERD (gastroesophageal reflux disease)    H/O hiatal hernia    History of  cervical dysplasia    CIN II   History of peritonitis    2006--  S/P PERFORATED BOWEL INJURY DURING SURGERY   Hyperlipidemia    Hypertension      Past Surgical History:  Procedure Laterality Date   ABDOMINAL HYSTERECTOMY  1991   W/ BILATERAL SALPINGOOPHORECTOMY   BREAST BIOPSY Right 2014   BREAST BIOPSY Left 11/15/2023   US  LT BREAST BX W LOC DEV 1ST LESION IMG BX SPEC US  GUIDE 11/15/2023 [facility]AMMOGRAP[facility]   LAPAROSCOPIC CHOLECYSTECTOMY  2006   W/ PERFERATED BOWEL (INJURY)   NASAL SEPTUM SURGERY  70's   REPAIR DEVIATED SEPTUM   sepsis after gallbladder surgery     TUBAL LIGATION  1980'S   VAGINAL PROLAPSE REPAIR N/A 08/15/2012   Procedure: VAGINAL VAULT SUSPENSION;  Surgeon: Arlena LILLETTE Gal, MD;  Location: [facility];  Service: Urology;  Laterality: N/A;  ANTERIOR VAULT REPAIR WITH XENFORE    Family History  Problem Relation Age of Onset   Coronary artery disease Mother    Other Mother        cva   Hypertension Mother    Heart disease Mother    Stroke Father    Breast cancer Sister    Breast cancer Sister    Pancreatic cancer Brother    Aneurysm Brother        abdominal    Prostate cancer Brother    Kidney cancer Son    Diabetes Other    Coronary artery disease Other    Hypertension Other    Stroke Other    Cancer Other        breast   Colon cancer Neg Hx    Esophageal cancer Neg Hx    Stomach cancer Neg Hx    Liver disease Neg Hx      Social Connections: Moderately Integrated (07/05/2023)   Social Connection and Isolation Panel    Frequency of Communication with Friends and Family: More than three times a week    Frequency of Social Gatherings with Friends and Family: More than three times a week    Attends Religious Services: More than 4 times per year    Active Member of [employer] or Organizations: Yes    Attends Banker Meetings: Never    Marital Status: Widowed      Current Outpatient Medications:    alum & mag  hydroxide-simeth (MAALOX/MYLANTA) 200-200-20 MG/5ML suspension, Take 15 mLs by mouth as needed for indigestion or heartburn., Disp: , Rfl:    esomeprazole  (NEXIUM ) 40 MG capsule, Take 1 capsule (40 mg total) by mouth daily., Disp: 90 capsule, Rfl: 1   estradiol  (ESTRACE ) 0.1 MG/GM vaginal cream, Place 1 Applicatorful vaginally 3 (three) times a week., Disp: 42.5 g, Rfl: 0   ezetimibe  (ZETIA ) 10 MG tablet, Take 1 tablet (10 mg total) by mouth daily., Disp: 90 tablet, Rfl: 1   famotidine  (PEPCID ) 20 MG tablet, Take 1 tablet (20 mg total) by mouth 2 (two) times daily., Disp: 180 tablet, Rfl: 1   flurbiprofen (ANSAID) 100 MG tablet, Take by mouth., Disp: , Rfl:    fluticasone  (FLONASE ) 50 MCG/ACT nasal spray, Place 2 sprays into both nostrils daily., Disp: 48 mL, Rfl: 3   JARDIANCE  10 MG TABS tablet, Take 10 mg by mouth every morning., Disp: , Rfl:    loratadine  (CLARITIN ) 10  MG tablet, Take 1 tablet (10 mg total) by mouth daily., Disp: 30 tablet, Rfl: 1   Magnesium  Oxide 420 MG TABS, 1 po bid, Disp: 60 tablet, Rfl: 0   Multiple Vitamin (MULTIVITAMIN) tablet, Take 1 tablet by mouth 3 (three) times a week., Disp: , Rfl:    potassium chloride  (KLOR-CON  10) 10 MEQ tablet, Take 1 tablet (10 mEq total) by mouth 2 (two) times daily., Disp: 180 tablet, Rfl: 0   Probiotic Product (PROBIOTIC-10 ULTIMATE) CAPS, Take by mouth daily at 6 (six) AM., Disp: , Rfl:    psyllium (METAMUCIL) 58.6 % packet, Take 1 packet by mouth daily., Disp: , Rfl:    rosuvastatin  (CRESTOR ) 20 MG tablet, , Disp: , Rfl:    spironolactone  (ALDACTONE ) 25 MG tablet, Take 1 tablet (25 mg total) by mouth daily., Disp: 90 tablet, Rfl: 1   tiZANidine  (ZANAFLEX ) 2 MG tablet, Take 1-2 tablets (2-4 mg total) by mouth every 8 (eight) hours as needed., Disp: 60 tablet, Rfl: 1   torsemide  (DEMADEX ) 10 MG tablet, , Disp: , Rfl:    VITAMIN D PO, Take 1 tablet by mouth daily., Disp: , Rfl:    Wheat Dextrin (BENEFIBER DRINK MIX) PACK, Take by mouth as  needed., Disp: , Rfl:    Physical Exam:   BP 131/79 (BP Location: Right Arm, Patient Position: Sitting, Cuff Size: Normal)   Pulse 93   Ht 5' (1.524 m)   Wt 125 lb (56.7 kg)   SpO2 96%   BMI 24.41 kg/m   Salient findings:  CN II-XII intact Bilateral EAC clear and TM intact with well pneumatized middle ear spaces B/l TMJ crepitus; clear discomfort with TMJ palpation and temporalis Anterior rhinoscopy: Septum intact; no purulence No lesions of oral cavity/oropharynx; easily expressible saliva from b/l parotid ducts No respiratory distress or stridor  Seprately Identifiable Procedures:  Prior to initiating any procedures, risks/benefits/alternatives were explained to the patient and verbal consent obtained.     Impression & Plans:  Shron Ozer is a 78 y.o. female with:  1. Left facial pain   2. Sensorineural hearing loss (SNHL) of both ears   3. Bilateral tinnitus   4. Arthralgia of left temporomandibular joint    Hearing: stable symptoms; tinnitus stable Left facial pain: seems history is most consistent with TMJ with palpable discomfort. Advised she talk to her dentist about this; given chronicity, however, discussed CT to rule out parotid lesions which she wishes to do. Discussed conservative measures for TMJ F/u by phone in 6 weeks  See below regarding exact medications prescribed this encounter including dosages and route: No orders of the defined types were placed in this encounter.     Thank you for allowing me the opportunity to care for your patient. Please do not hesitate to contact me should you have any other questions.  Sincerely, Eldora Blanch, MD Otolaryngologist (ENT), [facility] Phone: [phone] Fax: [phone]  05/15/2024, 1:13 PM   MDM:  I have personally spent 30 minutes involved in face-to-face and non-face-to-face activities for this patient on the day of the visit.  Professional time spent excludes any procedures  performed but includes the following activities, in addition to those noted in the documentation: preparing to see the patient (review of outside documentation and results), performing a medically appropriate examination, counseling, documenting in the electronic health record, independently interpreting results (CT).

## 2024-05-17 ENCOUNTER — Telehealth: Payer: Self-pay | Admitting: Internal Medicine

## 2024-05-17 NOTE — Telephone Encounter (Signed)
 Pt called in stating she had a heart monitor ordered by her PCP. She asked if Dr. Santo can read results and give recommendations.

## 2024-05-18 ENCOUNTER — Encounter: Payer: Self-pay | Admitting: Internal Medicine

## 2024-05-18 NOTE — Progress Notes (Signed)
 Pharmacy Quality Measure Review  This patient is appearing on a report for being at risk of failing the adherence measure for diabetes medications this calendar year.   Medication: Empagliflozin  10mg  Last fill date: 10/28 for 30 day supply  Insurance report was not up to date. No action needed at this time.   Angela Baalmann, PharmD [facility] Pharmacist

## 2024-05-18 NOTE — Telephone Encounter (Signed)
 I spoke with patient and gave her message from Dr Santo

## 2024-05-24 ENCOUNTER — Ambulatory Visit (HOSPITAL_COMMUNITY)
Admission: RE | Admit: 2024-05-24 | Discharge: 2024-05-24 | Disposition: A | Discharge status: Home / Self Care | Source: Ambulatory Visit | Attending: Otolaryngology | Admitting: Otolaryngology

## 2024-05-24 DIAGNOSIS — R519 Headache, unspecified: Secondary | ICD-10-CM | POA: Diagnosis not present

## 2024-05-24 MED ORDER — IOHEXOL 350 MG/ML SOLN
75.0000 mL | Freq: Once | INTRAVENOUS | Status: AC | PRN
  Administered 2024-05-24: 75 mL via INTRAVENOUS

## 2024-05-25 ENCOUNTER — Other Ambulatory Visit: Payer: Self-pay | Admitting: Internal Medicine

## 2024-05-30 ENCOUNTER — Telehealth: Payer: Self-pay

## 2024-05-30 NOTE — Telephone Encounter (Signed)
 Copied from CRM #[id number]. Topic: Clinical - Prescription Issue >> May 29, 2024  9:32 AM Darshell M wrote: Reason for CRM: Patient has a question about the medication Jardiance . Medication is causing patient to lose weight and causing her hair to thin. Medication also cost $47 per month. Patient wants to be prescribed a different medication. Patient took the last Jardiance  tablet Friday. Since patient's A1C was 5.8, does patient still need to take medication. Patient also wants to know if she still needs to take magnesium  and potassium supplements. Patient CB# [phone] home, [phone] cell.

## 2024-06-02 ENCOUNTER — Telehealth: Payer: Self-pay

## 2024-06-02 NOTE — Telephone Encounter (Signed)
 Please advise

## 2024-06-02 NOTE — Telephone Encounter (Signed)
 She wants to know if you're going to give her something in place of this medication.

## 2024-06-02 NOTE — Telephone Encounter (Signed)
 Copied from CRM #[id number]. Topic: Clinical - Prescription Issue >> May 29, 2024  9:32 AM [name] wrote: Reason for CRM: Patient has a question about the medication Jardiance . Medication is causing patient to lose weight and causing her hair to thin. Medication also cost $47 per month. Patient wants to be prescribed a different medication. Patient took the last Jardiance  tablet Friday. Since patient's A1C was 5.8, does patient still need to take medication. Patient also wants to know if she still needs to take magnesium  and potassium supplements. Patient CB# [phone] home, [phone] cell. >> Jun 02, 2024  9:18 AM Robinson H wrote: Patient is following up on message sent 3 days ago, states she never received a callback from office and needs to speak to someone regarding medication issue.  [name] [phone] home, [phone] cell

## 2024-06-05 NOTE — Telephone Encounter (Signed)
 Patient has been made aware and gave a verbal understanding.

## 2024-06-26 ENCOUNTER — Telehealth: Payer: Self-pay | Admitting: Pharmacist

## 2024-06-26 NOTE — Progress Notes (Signed)
 Pharmacy Quality Measure Review  This patient is appearing on a report for being at risk of failing the adherence measure for diabetes medications this calendar year.   Medication: Jardiance  Last fill date: 04/25/24 for 30 day supply  Upon chart review, PCP advised pt discontinue Jardiance  per 06/02/24 telephone encounter. Will remove from medication list. No further action needed.  Darrelyn Drum, PharmD, BCPS, CPP Clinical Pharmacist Practitioner [facility] [phone]

## 2024-07-03 ENCOUNTER — Ambulatory Visit (INDEPENDENT_AMBULATORY_CARE_PROVIDER_SITE_OTHER): Admitting: Otolaryngology

## 2024-07-03 DIAGNOSIS — H903 Sensorineural hearing loss, bilateral: Secondary | ICD-10-CM

## 2024-07-03 DIAGNOSIS — M26622 Arthralgia of left temporomandibular joint: Secondary | ICD-10-CM | POA: Diagnosis not present

## 2024-07-03 DIAGNOSIS — R519 Headache, unspecified: Secondary | ICD-10-CM

## 2024-07-03 DIAGNOSIS — H9313 Tinnitus, bilateral: Secondary | ICD-10-CM | POA: Diagnosis not present

## 2024-07-04 ENCOUNTER — Ambulatory Visit (INDEPENDENT_AMBULATORY_CARE_PROVIDER_SITE_OTHER)

## 2024-07-04 ENCOUNTER — Encounter (INDEPENDENT_AMBULATORY_CARE_PROVIDER_SITE_OTHER): Payer: Self-pay | Admitting: Otolaryngology

## 2024-07-04 VITALS — Ht 60.0 in | Wt 130.0 lb

## 2024-07-04 DIAGNOSIS — Z0000 Encounter for general adult medical examination without abnormal findings: Secondary | ICD-10-CM | POA: Diagnosis not present

## 2024-07-04 NOTE — Patient Instructions (Signed)
 Ms. Diehl,  Thank you for taking the time for your Medicare Wellness Visit. I appreciate your continued commitment to your health goals. Please review the care plan we discussed, and feel free to reach out if I can assist you further.  Please note that Annual Wellness Visits do not include a physical exam. Some assessments may be limited, especially if the visit was conducted virtually. If needed, we may recommend an in-person follow-up with your provider.  Ongoing Care Seeing your primary care provider every 3 to 6 months helps us  monitor your health and provide consistent, personalized care.   Referrals If a referral was made during today's visit and you haven't received any updates within two weeks, please contact the referred provider directly to check on the status.  Recommended Screenings:  Health Maintenance  Topic Date Due   Yearly kidney health urinalysis for diabetes  Never done   Eye exam for diabetics  11/06/2016   Medicare Annual Wellness Visit  07/04/2024   Zoster (Shingles) Vaccine (1 of 2) 08/03/2024*   Hemoglobin A1C  10/10/2024   COVID-19 Vaccine (8 - 2025-26 season) 10/24/2024   Yearly kidney function blood test for diabetes  04/11/2025   Colon Cancer Screening  04/22/2026   DTaP/Tdap/Td vaccine (3 - Td or Tdap) 10/24/2031   Pneumococcal Vaccine for age over 85  Completed   Flu Shot  Completed   Osteoporosis screening with Bone Density Scan  Completed   Hepatitis C Screening  Completed   Meningitis B Vaccine  Aged Out   Complete foot exam   Discontinued   Breast Cancer Screening  Discontinued  *Topic was postponed. The date shown is not the original due date.       07/04/2024   11:01 AM  Advanced Directives  Does Patient Have a Medical Advance Directive? No  Would patient like information on creating a medical advance directive? No - Patient declined    Vision: Annual vision screenings are recommended for early detection of glaucoma, cataracts, and  diabetic retinopathy. These exams can also reveal signs of chronic conditions such as diabetes and high blood pressure.  Dental: Annual dental screenings help detect early signs of oral cancer, gum disease, and other conditions linked to overall health, including heart disease and diabetes.  Please see the attached documents for additional preventive care recommendations.

## 2024-07-04 NOTE — Progress Notes (Signed)
 Dear Dr. Joshua, Here is my assessment for our mutual patient, [name]. Thank you for allowing me the opportunity to care for your patient. Please do not hesitate to contact me should you have any other questions. Sincerely, Dr. Eldora Blanch  Otolaryngology Clinic Note Referring provider: Dr. Joshua HPI:  Maanvi Lecompte is a 79 y.o. female kindly referred by Dr. Joshua for evaluation of bilateral hearing loss and tinnitus  Initial visit (11/2023): Patient reports: bilateral tinnitus for over a year, prior was intermittent. Prior seen by Dr. Carlie. Intermittent popping and feeling stopped up, some hearing loss. She did have an audio in March 2025. Does not bother her when she sleeps. When there are distractions, does not notice tinnitus as much. No recent medication changes, no significant neck pain. Not on ASA. No significant noise exposure. No antecedent event. Perhaps hearing declining some.  Patient denies: ear pain, fullness, vertigo, drainage, Patient additionally denies: deep pain in ear canal, eustachian tube symptoms such as consistent popping/crackling, sensitive to pressure changes Patient also denies barotrauma, vestibular suppressant use, ototoxic medication use Prior ear surgery: no  -------------------------------------------------------- 05/15/2024 Seen in follow up for different complaint. She reports that around August, she started to have fairly significant discomfort around left jaw. She went to a Dentist, and then the Endodontist who prescribed antibiotics. She reports that the pain has persisted, but has not resolved. Pain is worse when she chews, opens the mouth. Pain has improved overall, radiates to temporal area and down her jaw. This all started after she opened her mouth and heard a pop - felt like her joint popped. No significant ear pain. No swelling over parotid area. No dental pain. Not aware of bruxism. No pain or swelling with eating. Patient otherwise denies: -  dysphagia, odynophagia, unintentional weight loss - changes in voice, shortness of breath, hemoptysis - neck masses Allergic rhinitis/PND: resolved --------------------------------------------------------- 07/03/2024 The patient gave consent to have this visit done by telemedicine / virtual visit, two identifiers were used to identify patient. This is also consent for access the chart and treat the patient via this visit. The patient is located in [place] .  I, the provider, am at the office.  We spent 8 minutes together for the visit.  Joined by telephone.  Doing fairly well but if she yawns, she has some discomfort preauricular. We discussed her CT. Otherwise asymptomatic.   H&N Surgery: septoplasty (70s?) Personal or FHx of bleeding dz or anesthesia difficulty: no  AP/AC: no  Tobacco: no  PMHx: HTN, CAD, GERD, PreDM  Independent Review of Additional Tests or Records:  CT Head 2019 independently interpreted with respect to ears :mastoids and ME well aerated; cuts thick so suboptimal eval but no ossicular chain or otic capsule abnormalities noted Dr. Joshua (09/14/2023): year long h/o tinnitus, hearing loss noted; Dx: SNHL, Tinnitus; Rx: ref to ENT and audio Dr. Carlie 09/10/2022: noted tinnitus, px since Oct 2023. Suspect related to BP medication; noted b/l SNHL, WRT 84% AU; Dx: Tinnitus and SNHL; Rx: masking, consider HA Darryle Posey (AuD) 08/2023 audio reviewed: A/A tymps; mild to moderate sensorineural hearing loss, bilaterally. Speech Recognition Thresholds were obtained at 40 dB HL in the right ear and at 35 dB HL in the left ear. Word Recognition Testing was completed at 70 dB HL and Wilmary scored 88% in the right ear and 100% in the left ear.    Dr. Norleen (01/07/2024): worsening allergy symptoms -- congestion, itching, sneezing; some hoarseness; Dx: Allergic rhinitis; hoarseness; Rx: steroids,  zyrtec Labs CBC and CMP 04/11/2024: WBC 4.4, Eos 0; K2.8, BUN/Cr 9/0.7 CTH 04/28/2024  independently interpreted: suboptimal eval given no contrast for any glands/soft tissue but sinuses well aerated and mastoid and ME well aerated; moderate motion artifact; visualized parotid without nodules CT Neck 05/30/2024 independently interpreted: no obvious masses or lesions around EAC/TMJ (perhaps mild erosion?) or face; parotids wnl, no noted necrotic lymphadenopathy PMH/Meds/All/SocHx/FamHx/ROS:   Past Medical History:  Diagnosis Date   Arthritis    KNEES   Blood transfusion without reported diagnosis    Cataract    CTS (carpal tunnel syndrome)    BILATERAL LEFT > RIGHT (OCC. HAND NUMBNESS)   Female cystocele    GERD (gastroesophageal reflux disease)    H/O hiatal hernia    History of cervical dysplasia    CIN II   History of peritonitis    2006--  S/P PERFORATED BOWEL INJURY DURING SURGERY   Hyperlipidemia    Hypertension      Past Surgical History:  Procedure Laterality Date   ABDOMINAL HYSTERECTOMY  1991   W/ BILATERAL SALPINGOOPHORECTOMY   BREAST BIOPSY Right 2014   BREAST BIOPSY Left 11/15/2023   US  LT BREAST BX W LOC DEV 1ST LESION IMG BX SPEC US  GUIDE 11/15/2023 [facility]AMMOGRAPHY   LAPAROSCOPIC CHOLECYSTECTOMY  2006   W/ PERFERATED BOWEL (INJURY)   NASAL SEPTUM SURGERY  70's   REPAIR DEVIATED SEPTUM   sepsis after gallbladder surgery     TUBAL LIGATION  1980'S   VAGINAL PROLAPSE REPAIR N/A 08/15/2012   Procedure: VAGINAL VAULT SUSPENSION;  Surgeon: Arlena LILLETTE Gal, MD;  Location: [facility];  Service: Urology;  Laterality: N/A;  ANTERIOR VAULT REPAIR WITH XENFORE    Family History  Problem Relation Age of Onset   Coronary artery disease Mother    Other Mother        cva   Hypertension Mother    Heart disease Mother    Stroke Father    Breast cancer Sister    Breast cancer Sister    Pancreatic cancer Brother    Aneurysm Brother        abdominal    Prostate cancer Brother    Kidney cancer Son    Diabetes Other    Coronary artery  disease Other    Hypertension Other    Stroke Other    Cancer Other        breast   Colon cancer Neg Hx    Esophageal cancer Neg Hx    Stomach cancer Neg Hx    Liver disease Neg Hx      Social Connections: Moderately Integrated (07/04/2024)   Social Connection and Isolation Panel    Frequency of Communication with Friends and Family: More than three times a week    Frequency of Social Gatherings with Friends and Family: Twice a week    Attends Religious Services: More than 4 times per year    Active Member of [employer] or Organizations: Yes    Attends Banker Meetings: More than 4 times per year    Marital Status: Widowed      Current Outpatient Medications:    alum & mag hydroxide-simeth (MAALOX/MYLANTA) 200-200-20 MG/5ML suspension, Take 15 mLs by mouth as needed for indigestion or heartburn., Disp: , Rfl:    esomeprazole  (NEXIUM ) 40 MG capsule, Take 1 capsule (40 mg total) by mouth daily., Disp: 90 capsule, Rfl: 1   estradiol  (ESTRACE ) 0.1 MG/GM vaginal cream, Place 1 Applicatorful vaginally 3 (  three) times a week. (Patient not taking: Reported on 07/04/2024), Disp: 42.5 g, Rfl: 0   ezetimibe  (ZETIA ) 10 MG tablet, Take 1 tablet (10 mg total) by mouth daily., Disp: 90 tablet, Rfl: 1   famotidine  (PEPCID ) 20 MG tablet, Take 1 tablet (20 mg total) by mouth 2 (two) times daily., Disp: 180 tablet, Rfl: 1   flurbiprofen (ANSAID) 100 MG tablet, Take by mouth. (Patient not taking: Reported on 07/04/2024), Disp: , Rfl:    fluticasone  (FLONASE ) 50 MCG/ACT nasal spray, Place 2 sprays into both nostrils daily., Disp: 48 mL, Rfl: 3   loratadine  (CLARITIN ) 10 MG tablet, Take 1 tablet (10 mg total) by mouth daily., Disp: 30 tablet, Rfl: 1   Magnesium  Oxide -Mg Supplement 420 (252 Mg) MG TABS, TAKE 1 TABLET BY MOUTH TWICE A DAY (Patient not taking: Reported on 07/04/2024), Disp: 180 tablet, Rfl: 1   Multiple Vitamin (MULTIVITAMIN) tablet, Take 1 tablet by mouth 3 (three) times a week., Disp: ,  Rfl:    potassium chloride  (KLOR-CON  10) 10 MEQ tablet, Take 1 tablet (10 mEq total) by mouth 2 (two) times daily., Disp: 180 tablet, Rfl: 0   Probiotic Product (PROBIOTIC-10 ULTIMATE) CAPS, Take by mouth daily at 6 (six) AM., Disp: , Rfl:    psyllium (METAMUCIL) 58.6 % packet, Take 1 packet by mouth daily. (Patient not taking: Reported on 07/04/2024), Disp: , Rfl:    rosuvastatin  (CRESTOR ) 20 MG tablet, , Disp: , Rfl:    spironolactone  (ALDACTONE ) 25 MG tablet, Take 1 tablet (25 mg total) by mouth daily. (Patient not taking: Reported on 07/04/2024), Disp: 90 tablet, Rfl: 1   tiZANidine  (ZANAFLEX ) 2 MG tablet, Take 1-2 tablets (2-4 mg total) by mouth every 8 (eight) hours as needed., Disp: 60 tablet, Rfl: 1   torsemide  (DEMADEX ) 10 MG tablet, , Disp: , Rfl:    VITAMIN D PO, Take 1 tablet by mouth daily., Disp: , Rfl:    Wheat Dextrin (BENEFIBER DRINK MIX) PACK, Take by mouth as needed., Disp: , Rfl:    Physical Exam:   There were no vitals taken for this visit.  Salient findings:  Cannot perform due to phone visit  Seprately Identifiable Procedures:  Prior to initiating any procedures, risks/benefits/alternatives were explained to the patient and verbal consent obtained.     Impression & Plans:  Jeremy Mclamb is a 79 y.o. female with:  1. Sensorineural hearing loss (SNHL) of both ears   2. Bilateral tinnitus   3. Arthralgia of left temporomandibular joint   4. Left facial pain    Hearing: stable symptoms; tinnitus stable Left facial pain: seems history is most consistent with TMJ with palpable discomfort. Now improved but still some sx when she yawns; has dental follow up later this month; is improving, CT without masses; suspect TMJ at this point and she will discuss with her dentist; return precautions provided See below regarding exact medications prescribed this encounter including dosages and route: No orders of the defined types were placed in this encounter.     Thank you for  allowing me the opportunity to care for your patient. Please do not hesitate to contact me should you have any other questions.  Sincerely, Eldora Blanch, MD Otolaryngologist (ENT), [facility] Phone: [phone] Fax: [phone]  07/04/2024, 4:32 PM   MDM:  [phone] - low, mod (independent CT interpretation), low

## 2024-07-04 NOTE — Progress Notes (Signed)
 "  Chief Complaint  Patient presents with   Medicare Wellness     Subjective:   [name] is a 79 y.o. female who presents for a Medicare Annual Wellness Visit.  Visit info / Clinical Intake: Medicare Wellness Visit Type:: Subsequent Annual Wellness Visit Persons participating in visit and providing information:: patient Medicare Wellness Visit Mode:: Telephone If telephone:: video declined Since this visit was completed virtually, some vitals may be partially provided or unavailable. Missing vitals are due to the limitations of the virtual format.: Documented vitals are patient reported If Telephone or Video please confirm:: I connected with patient using audio/video enable telemedicine. I verified patient identity with two identifiers, discussed telehealth limitations, and patient agreed to proceed. Patient Location:: home Provider Location:: home office Interpreter Needed?: No Pre-visit prep was completed: yes AWV questionnaire completed by patient prior to visit?: no Living arrangements:: with family/others Patient's Overall Health Status Rating: good Typical amount of pain: some Does pain affect daily life?: (!) yes (sometimes) Are you currently prescribed opioids?: no  Dietary Habits and Nutritional Risks How many meals a day?: 3 Eats fruit and vegetables daily?: yes Most meals are obtained by: preparing own meals; eating out In the last 2 weeks, have you had any of the following?: (!) nausea, vomiting, diarrhea Diabetic:: (!) yes Any non-healing wounds?: no How often do you check your BS?: 0 Would you like to be referred to a Nutritionist or for Diabetic Management? : no  Functional Status Activities of Daily Living (to include ambulation/medication): Independent Ambulation: Independent with device- listed below Home Assistive Devices/Equipment: Brace (specify type) (knee) Medication Administration: Independent Home Management (perform basic housework or  laundry): Independent Manage your own finances?: yes Primary transportation is: driving Concerns about vision?: no *vision screening is required for WTM* Concerns about hearing?: (!) yes Uses hearing aids?: no  Fall Screening Falls in the past year?: 1 (tripped on cord) Number of falls in past year: 0 Was there an injury with Fall?: 0 Fall Risk Category Calculator: 1 Patient Fall Risk Level: Low Fall Risk  Fall Risk Patient at Risk for Falls Due to: Medication side effect Fall risk Follow up: Falls prevention discussed; Falls evaluation completed  Home and Transportation Safety: All rugs have non-skid backing?: yes All stairs or steps have railings?: yes Grab bars in the bathtub or shower?: (!) no Have non-skid surface in bathtub or shower?: yes Good home lighting?: yes Regular seat belt use?: yes Hospital stays in the last year:: no  Cognitive Assessment Difficulty concentrating, remembering, or making decisions? : no Will 6CIT or Mini Cog be Completed: yes What year is it?: 0 points What month is it?: 0 points Give patient an address phrase to remember (5 components): [address] [place] About what time is it?: 0 points Count backwards from 20 to 1: 0 points Say the months of the year in reverse: 0 points Repeat the address phrase from earlier: 0 points 6 CIT Score: 0 points  Advance Directives (For Healthcare) Does Patient Have a Medical Advance Directive?: No Type of Advance Directive: Healthcare Power of [place]; Living will Would patient like information on creating a medical advance directive?: No - Patient declined  Reviewed/Updated  Reviewed/Updated: Reviewed All (Medical, Surgical, Family, Medications, Allergies, Care Teams, Patient Goals)    Allergies (verified) Zocor  [simvastatin ], Advil [ibuprofen], Bran [wheat], Hydrocodone , Hydrocodone -acetaminophen , Metoprolol  tartrate, Hydrochlorothiazide , Lipitor [atorvastatin], and Pravachol [pravastatin  sodium]   Current Medications (verified) Outpatient Encounter Medications as of 07/04/2024  Medication Sig  alum & mag hydroxide-simeth (MAALOX/MYLANTA) 200-200-20 MG/5ML suspension Take 15 mLs by mouth as needed for indigestion or heartburn.   esomeprazole  (NEXIUM ) 40 MG capsule Take 1 capsule (40 mg total) by mouth daily.   ezetimibe  (ZETIA ) 10 MG tablet Take 1 tablet (10 mg total) by mouth daily.   famotidine  (PEPCID ) 20 MG tablet Take 1 tablet (20 mg total) by mouth 2 (two) times daily.   fluticasone  (FLONASE ) 50 MCG/ACT nasal spray Place 2 sprays into both nostrils daily.   loratadine  (CLARITIN ) 10 MG tablet Take 1 tablet (10 mg total) by mouth daily.   Multiple Vitamin (MULTIVITAMIN) tablet Take 1 tablet by mouth 3 (three) times a week.   potassium chloride  (KLOR-CON  10) 10 MEQ tablet Take 1 tablet (10 mEq total) by mouth 2 (two) times daily.   Probiotic Product (PROBIOTIC-10 ULTIMATE) CAPS Take by mouth daily at 6 (six) AM.   tiZANidine  (ZANAFLEX ) 2 MG tablet Take 1-2 tablets (2-4 mg total) by mouth every 8 (eight) hours as needed.   VITAMIN D PO Take 1 tablet by mouth daily.   Wheat Dextrin (BENEFIBER DRINK MIX) PACK Take by mouth as needed.   estradiol  (ESTRACE ) 0.1 MG/GM vaginal cream Place 1 Applicatorful vaginally 3 (three) times a week. (Patient not taking: Reported on 07/04/2024)   flurbiprofen (ANSAID) 100 MG tablet Take by mouth. (Patient not taking: Reported on 07/04/2024)   Magnesium  Oxide -Mg Supplement 420 (252 Mg) MG TABS TAKE 1 TABLET BY MOUTH TWICE A DAY (Patient not taking: Reported on 07/04/2024)   psyllium (METAMUCIL) 58.6 % packet Take 1 packet by mouth daily. (Patient not taking: Reported on 07/04/2024)   rosuvastatin  (CRESTOR ) 20 MG tablet  (Patient not taking: Reported on 07/04/2024)   spironolactone  (ALDACTONE ) 25 MG tablet Take 1 tablet (25 mg total) by mouth daily. (Patient not taking: Reported on 07/04/2024)   torsemide  (DEMADEX ) 10 MG tablet  (Patient not taking: Reported  on 07/04/2024)   No facility-administered encounter medications on file as of 07/04/2024.    History: Past Medical History:  Diagnosis Date   Arthritis    KNEES   Blood transfusion without reported diagnosis    Cataract    CTS (carpal tunnel syndrome)    BILATERAL LEFT > RIGHT (OCC. HAND NUMBNESS)   Female cystocele    GERD (gastroesophageal reflux disease)    H/O hiatal hernia    History of cervical dysplasia    CIN II   History of peritonitis    2006--  S/P PERFORATED BOWEL INJURY DURING SURGERY   Hyperlipidemia    Hypertension    Past Surgical History:  Procedure Laterality Date   ABDOMINAL HYSTERECTOMY  1991   W/ BILATERAL SALPINGOOPHORECTOMY   BREAST BIOPSY Right 2014   BREAST BIOPSY Left 11/15/2023   US  LT BREAST BX W LOC DEV 1ST LESION IMG BX SPEC US  GUIDE 11/15/2023 [facility] MAMMOGRAPHY   LAPAROSCOPIC CHOLECYSTECTOMY  2006   W/ PERFERATED BOWEL (INJURY)   NASAL SEPTUM SURGERY  70's   REPAIR DEVIATED SEPTUM   sepsis after gallbladder surgery     TUBAL LIGATION  1980'S   VAGINAL PROLAPSE REPAIR N/A 08/15/2012   Procedure: VAGINAL VAULT SUSPENSION;  Surgeon: Arlena LILLETTE Gal, MD;  Location: [facility];  Service: Urology;  Laterality: N/A;  ANTERIOR VAULT REPAIR WITH XENFORE   Family History  Problem Relation Age of Onset   Coronary artery disease Mother    Other Mother        cva   Hypertension Mother  Heart disease Mother    Stroke Father    Breast cancer Sister    Breast cancer Sister    Pancreatic cancer Brother    Aneurysm Brother        abdominal    Prostate cancer Brother    Kidney cancer Son    Diabetes Other    Coronary artery disease Other    Hypertension Other    Stroke Other    Cancer Other        breast   Colon cancer Neg Hx    Esophageal cancer Neg Hx    Stomach cancer Neg Hx    Liver disease Neg Hx    Social History   Occupational History   Occupation: Retired  Tobacco Use   Smoking status: Never   Smokeless  tobacco: Never  Vaping Use   Vaping status: Never Used  Substance and Sexual Activity   Alcohol use: No    Alcohol/week: 0.0 standard drinks of alcohol    Comment: no   Drug use: No   Sexual activity: Not Currently    Birth control/protection: Surgical   Tobacco Counseling Counseling given: Not Answered  SDOH Screenings   Food Insecurity: No Food Insecurity (07/04/2024)  Housing: Unknown (07/04/2024)  Transportation Needs: No Transportation Needs (07/04/2024)  Utilities: Not At Risk (07/04/2024)  Alcohol Screen: Low Risk (07/04/2024)  Depression (PHQ2-9): Low Risk (07/04/2024)  Financial Resource Strain: Low Risk (07/04/2024)  Physical Activity: Inactive (07/04/2024)  Social Connections: Moderately Integrated (07/04/2024)  Stress: No Stress Concern Present (07/04/2024)  Tobacco Use: Low Risk (07/04/2024)  Health Literacy: Adequate Health Literacy (07/04/2024)   See flowsheets for full screening details  Depression Screen PHQ 2 & 9 Depression Scale- Over the past 2 weeks, how often have you been bothered by any of the following problems? Little interest or pleasure in doing things: 0 Feeling down, depressed, or hopeless (PHQ Adolescent also includes...irritable): 0 PHQ-2 Total Score: 0 Trouble falling or staying asleep, or sleeping too much: 0 Feeling tired or having little energy: 0 Poor appetite or overeating (PHQ Adolescent also includes...weight loss): 0 Feeling bad about yourself - or that you are a failure or have let yourself or your family down: 0 Trouble concentrating on things, such as reading the newspaper or watching television (PHQ Adolescent also includes...like school work): 0 Moving or speaking so slowly that other people could have noticed. Or the opposite - being so fidgety or restless that you have been moving around a lot more than usual: 0 Thoughts that you would be better off dead, or of hurting yourself in some way: 0 PHQ-9 Total Score: 0 If you checked off any problems, how  difficult have these problems made it for you to do your work, take care of things at home, or get along with other people?: Not difficult at all  Depression Treatment Depression Interventions/Treatment : EYV7-0 Score <4 Follow-up Not Indicated     Goals Addressed             This Visit's Progress    Patient Stated       07/05/2023, trying to gain weight             Objective:    Today's Vitals   07/04/24 1049  Weight: 130 lb (59 kg)  Height: 5' (1.524 m)   Body mass index is 25.39 kg/m.  Hearing/Vision screen Hearing Screening - Comments:: Some trouble, but sees an ENT Vision Screening - Comments:: Regular eye exams, Dr. Charmayne Immunizations and  Health Maintenance Health Maintenance  Topic Date Due   Diabetic kidney evaluation - Urine ACR  Never done   OPHTHALMOLOGY EXAM  11/06/2016   Zoster Vaccines- Shingrix (1 of 2) 08/03/2024 (Originally 10/05/1995)   HEMOGLOBIN A1C  10/10/2024   COVID-19 Vaccine (8 - 2025-26 season) 10/24/2024   Diabetic kidney evaluation - eGFR measurement  04/11/2025   Medicare Annual Wellness (AWV)  07/04/2025   Colonoscopy  04/22/2026   DTaP/Tdap/Td (3 - Td or Tdap) 10/24/2031   Pneumococcal Vaccine: 50+ Years  Completed   Influenza Vaccine  Completed   Bone Density Scan  Completed   Hepatitis C Screening  Completed   Meningococcal B Vaccine  Aged Out   FOOT EXAM  Discontinued   Mammogram  Discontinued        Assessment/Plan:  This is a routine wellness examination for Eldena.  Patient Care Team: Joshua Debby CROME, MD as PCP - General (Internal Medicine) Santo Stanly LABOR, MD as PCP - Cardiology (Cardiology) Charmayne Molly, MD as Consulting Physician (Ophthalmology) Merceda Lela SAUNDERS, RPH-CPP (Pharmacist)  I have personally reviewed and noted the following in the patients chart:   Medical and social history Use of alcohol, tobacco or illicit drugs  Current medications and supplements including opioid  prescriptions. Functional ability and status Nutritional status Physical activity Advanced directives List of other physicians Hospitalizations, surgeries, and ER visits in previous 12 months Vitals Screenings to include cognitive, depression, and falls Referrals and appointments  No orders of the defined types were placed in this encounter.  In addition, I have reviewed and discussed with patient certain preventive protocols, quality metrics, and best practice recommendations. A written personalized care plan for preventive services as well as general preventive health recommendations were provided to patient.   Ardella FORBES Dawn, LPN   01/30/7972   Return in 1 year (on 07/04/2025).  After Visit Summary: (MyChart) Due to this being a telephonic visit, the after visit summary with patients personalized plan was offered to patient via MyChart   Nurse Notes: HM Addressed: Due for an eye exam and UACR.  "

## 2024-07-06 ENCOUNTER — Encounter: Payer: Self-pay | Admitting: Family Medicine

## 2024-07-06 ENCOUNTER — Ambulatory Visit: Payer: Self-pay | Admitting: Family Medicine

## 2024-07-06 ENCOUNTER — Ambulatory Visit: Admitting: Family Medicine

## 2024-07-06 VITALS — BP 126/82 | HR 100 | Temp 98.4°F | Ht 60.0 in | Wt 127.6 lb

## 2024-07-06 DIAGNOSIS — I2510 Atherosclerotic heart disease of native coronary artery without angina pectoris: Secondary | ICD-10-CM | POA: Diagnosis not present

## 2024-07-06 DIAGNOSIS — I10 Essential (primary) hypertension: Secondary | ICD-10-CM

## 2024-07-06 DIAGNOSIS — E876 Hypokalemia: Secondary | ICD-10-CM

## 2024-07-06 DIAGNOSIS — T466X5A Adverse effect of antihyperlipidemic and antiarteriosclerotic drugs, initial encounter: Secondary | ICD-10-CM

## 2024-07-06 DIAGNOSIS — G720 Drug-induced myopathy: Secondary | ICD-10-CM

## 2024-07-06 DIAGNOSIS — K529 Noninfective gastroenteritis and colitis, unspecified: Secondary | ICD-10-CM

## 2024-07-06 LAB — CBC WITH DIFFERENTIAL/PLATELET
Basophils Absolute: 0 K/uL (ref 0.0–0.1)
Basophils Relative: 0.7 % (ref 0.0–3.0)
Eosinophils Absolute: 0.1 K/uL (ref 0.0–0.7)
Eosinophils Relative: 1.1 % (ref 0.0–5.0)
HCT: 39.9 % (ref 36.0–46.0)
Hemoglobin: 13.1 g/dL (ref 12.0–15.0)
Lymphocytes Relative: 33.7 % (ref 12.0–46.0)
Lymphs Abs: 1.6 K/uL (ref 0.7–4.0)
MCHC: 32.9 g/dL (ref 30.0–36.0)
MCV: 91 fl (ref 78.0–100.0)
Monocytes Absolute: 0.4 K/uL (ref 0.1–1.0)
Monocytes Relative: 7.7 % (ref 3.0–12.0)
Neutro Abs: 2.7 K/uL (ref 1.4–7.7)
Neutrophils Relative %: 56.8 % (ref 43.0–77.0)
Platelets: 273 K/uL (ref 150.0–400.0)
RBC: 4.38 Mil/uL (ref 3.87–5.11)
RDW: 13.4 % (ref 11.5–15.5)
WBC: 4.8 K/uL (ref 4.0–10.5)

## 2024-07-06 LAB — COMPREHENSIVE METABOLIC PANEL WITH GFR
ALT: 16 U/L (ref 3–35)
AST: 20 U/L (ref 5–37)
Albumin: 4.2 g/dL (ref 3.5–5.2)
Alkaline Phosphatase: 51 U/L (ref 39–117)
BUN: 13 mg/dL (ref 6–23)
CO2: 32 meq/L (ref 19–32)
Calcium: 9.8 mg/dL (ref 8.4–10.5)
Chloride: 103 meq/L (ref 96–112)
Creatinine, Ser: 0.65 mg/dL (ref 0.40–1.20)
GFR: 84.13 mL/min
Glucose, Bld: 89 mg/dL (ref 70–99)
Potassium: 3.9 meq/L (ref 3.5–5.1)
Sodium: 140 meq/L (ref 135–145)
Total Bilirubin: 0.7 mg/dL (ref 0.2–1.2)
Total Protein: 6.8 g/dL (ref 6.0–8.3)

## 2024-07-06 LAB — LIPID PANEL
Cholesterol: 196 mg/dL (ref 28–200)
HDL: 60.1 mg/dL
LDL Cholesterol: 118 mg/dL — ABNORMAL HIGH (ref 10–99)
NonHDL: 136.02
Total CHOL/HDL Ratio: 3
Triglycerides: 88 mg/dL (ref 10.0–149.0)
VLDL: 17.6 mg/dL (ref 0.0–40.0)

## 2024-07-06 LAB — LIPASE: Lipase: 20 U/L (ref 11.0–59.0)

## 2024-07-06 LAB — CK: Total CK: 252 U/L — ABNORMAL HIGH (ref 17–177)

## 2024-07-06 LAB — MAGNESIUM: Magnesium: 1.7 mg/dL (ref 1.5–2.5)

## 2024-07-06 NOTE — Patient Instructions (Addendum)
 May continue Nexium  once daily in the morning.  May take 20mg  pepcid  (famotidine ) in the evening.  We are checking labs today, will be in contact with any results that require further attention.  Follow-up with me for new or worsening symptoms.

## 2024-07-06 NOTE — Progress Notes (Signed)
 "  Acute Office Visit  Subjective:     Patient ID: [name], female    DOB: [birth year], 79 y.o.   MRN: [id number]  Chief Complaint  Patient presents with   Acute Visit    HPI  Discussed the use of AI scribe software for clinical note transcription with the patient, who gave verbal consent to proceed.  History of Present Illness [name] is a 79 year old female who presents with abdominal pain and bloating.  Abdominal pain and gastrointestinal symptoms - Several days of abdominal pain and bloating localized to the stomach - Frequent belching - Temporary relief with gas medication on Friday; pain recurred Sunday with sour stomach - Induced vomiting 3 to 4 times, which briefly improved symptoms - Pain recurred Monday; took gas medication again - No blood in stool or vomit - Vomit described as yellow  Use of acid-reducing medications - Takes Nexium  40 mg daily - Occasionally uses famotidine  - Concerned about overuse of acid-reducing medications after being told she can take famotidine  twice daily with Nexium   Hypertension and electrolyte management - History of hypertension - Recent home blood pressure readings slightly elevated, most recently 131/81 mmHg - Previously experienced rapid heartbeats with spironolactone ; wore a heart monitor for 2 weeks - Takes potassium supplements for prior hypokalemia - Concerned about blood pressure control  Unintentional weight loss - Weight decreased from 130 lb at home to 127 lb in clinic today - Concerned that current weight is too low     ROS Per HPI      Objective:    BP 126/82 (BP Location: Left Arm, Patient Position: Sitting)   Pulse 100   Temp 98.4 F (36.9 C) (Temporal)   Ht 5' (1.524 m)   Wt 127 lb 9.6 oz (57.9 kg)   SpO2 97%   BMI 24.92 kg/m    Physical Exam Vitals and nursing note reviewed.  Constitutional:      General: She is not in acute distress.    Appearance: She is normal weight.  HENT:      Head: Normocephalic and atraumatic.     Right Ear: External ear normal.     Left Ear: External ear normal.     Nose: Nose normal.     Mouth/Throat:     Mouth: Mucous membranes are moist.     Pharynx: Oropharynx is clear.  Eyes:     Extraocular Movements: Extraocular movements intact.     Pupils: Pupils are equal, round, and reactive to light.  Cardiovascular:     Rate and Rhythm: Normal rate and regular rhythm.     Pulses: Normal pulses.     Heart sounds: Normal heart sounds.  Pulmonary:     Effort: Pulmonary effort is normal. No respiratory distress.     Breath sounds: Normal breath sounds. No wheezing, rhonchi or rales.  Abdominal:     General: Bowel sounds are normal. There is no distension.     Palpations: Abdomen is soft. There is no mass.     Tenderness: There is no abdominal tenderness. There is no guarding or rebound.     Hernia: No hernia is present.  Musculoskeletal:        General: Normal range of motion.     Cervical back: Normal range of motion.     Right lower leg: No edema.     Left lower leg: No edema.  Lymphadenopathy:     Cervical: No cervical adenopathy.  Neurological:  General: No focal deficit present.     Mental Status: She is alert and oriented to person, place, and time.  Psychiatric:        Mood and Affect: Mood normal.        Thought Content: Thought content normal.     No results found for any visits on 07/06/24.      Assessment & Plan:   Assessment and Plan Assessment & Plan Gastroenteritis Symptoms improving with home management, indicating recovery. - Continue Nexium  in the morning. - Add Pepcid  (famotidine ) 20 mg at dinner for 3-4 days. - Monitor symptoms and report if no improvement by Monday.  Essential hypertension Home blood pressure readings slightly elevated but not concerning. Office readings acceptable. - Monitor blood pressure at home. - Report if home readings consistently reach 140s or 145s for a  week.  Hypokalemia and hypomagnesemia Previous low potassium and magnesium  levels. Currently on potassium supplements. - Ordered lab tests to recheck potassium and magnesium  levels. - Ordered additional labs including cholesterol, liver, and kidney function tests.  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   Return if symptoms worsen or fail to improve.  Corean LITTIE Ku, FNP  "

## 2024-07-07 LAB — URINE CULTURE: Result:: NO GROWTH

## 2024-07-10 ENCOUNTER — Ambulatory Visit: Attending: Internal Medicine | Admitting: Internal Medicine

## 2024-07-10 VITALS — BP 139/75 | HR 78 | Ht 60.0 in | Wt 126.0 lb

## 2024-07-10 DIAGNOSIS — T466X5D Adverse effect of antihyperlipidemic and antiarteriosclerotic drugs, subsequent encounter: Secondary | ICD-10-CM | POA: Diagnosis not present

## 2024-07-10 DIAGNOSIS — G720 Drug-induced myopathy: Secondary | ICD-10-CM | POA: Diagnosis not present

## 2024-07-10 DIAGNOSIS — I2510 Atherosclerotic heart disease of native coronary artery without angina pectoris: Secondary | ICD-10-CM

## 2024-07-10 DIAGNOSIS — E785 Hyperlipidemia, unspecified: Secondary | ICD-10-CM

## 2024-07-10 DIAGNOSIS — T466X5A Adverse effect of antihyperlipidemic and antiarteriosclerotic drugs, initial encounter: Secondary | ICD-10-CM

## 2024-07-10 DIAGNOSIS — R002 Palpitations: Secondary | ICD-10-CM | POA: Diagnosis not present

## 2024-07-10 DIAGNOSIS — I10 Essential (primary) hypertension: Secondary | ICD-10-CM

## 2024-07-10 DIAGNOSIS — E782 Mixed hyperlipidemia: Secondary | ICD-10-CM | POA: Diagnosis not present

## 2024-07-10 NOTE — Progress Notes (Signed)
 " Cardiology Office Note:    Date:  07/10/2024   ID:  [name], DOB [birth year], MRN [id number]  PCP:  Joshua Debby LITTIE, MD   [facility] Providers Cardiologist:  Stanly DELENA Leavens, MD     Referring MD: Joshua Debby LITTIE, MD   CC: Mild non-obstructive CAD  History of Present Illness:    [name] is a 79 y.o. female with a hx of HTN, HLD complicated by statin intolerance (elevated CK per chart - she was unclear which CK bad dreams, failed simvastatin , pravastatin, and atorvastatin). 2023: has CT  with mild non-obstructive disease.  Had tinnitus on aspirin .  Started BB.  LDL above goal but was unable to take higher doses of conventional medicine. 2024: Felt bad on zetia  and is on rosuvastatin  3X week 2025: Rare SVT on heart monitor.  [name] is a 80 year old female with hypertension, hyperlipidemia, and paroxysmal supraventricular tachycardia who presents for follow-up of her cardiovascular conditions.  She has a history of hypertension and hyperlipidemia, with intolerance to multiple statins including simvastatin , pravastatin, atorvastatin, and rosuvastatin . She is currently on Zetia  10 mg after discontinuing rosuvastatin  due to intolerance. She has declined bempedoic acid and PCSK9 inhibitors in the past.  Her paroxysmal supraventricular tachycardia (SVT) has been stable since a heart monitor was performed in November of the previous year. She has experienced issues with metoprolol  in the past and has been managing her condition with electrolyte adjustments.  She experienced a recent viral gastroenteritis, which she believes she contracted from her son. Symptoms included stomach discomfort and feeling 'lonely' and 'hurt at times,' but she notes improvement now.  Her chest pain is 'fairly well' managed and she did not experience discomfort walking from the parking lot to the clinic. She inquires about the potential impact of low potassium and magnesium  on her  symptoms, noting that her electrolytes have improved with recent medication adjustments.   Past Medical History:  Diagnosis Date   Arthritis    KNEES   Blood transfusion without reported diagnosis    Cataract    CTS (carpal tunnel syndrome)    BILATERAL LEFT > RIGHT (OCC. HAND NUMBNESS)   Female cystocele    GERD (gastroesophageal reflux disease)    H/O hiatal hernia    History of cervical dysplasia    CIN II   History of peritonitis    2006--  S/P PERFORATED BOWEL INJURY DURING SURGERY   Hyperlipidemia    Hypertension     Past Surgical History:  Procedure Laterality Date   ABDOMINAL HYSTERECTOMY  1991   W/ BILATERAL SALPINGOOPHORECTOMY   BREAST BIOPSY Right 2014   BREAST BIOPSY Left 11/15/2023   US  LT BREAST BX W LOC DEV 1ST LESION IMG BX SPEC US  GUIDE 11/15/2023 [facility]   LAPAROSCOPIC CHOLECYSTECTOMY  2006   W/ PERFERATED BOWEL (INJURY)   NASAL SEPTUM SURGERY  70's   REPAIR DEVIATED SEPTUM   sepsis after gallbladder surgery     TUBAL LIGATION  1980'S   VAGINAL PROLAPSE REPAIR N/A 08/15/2012   Procedure: VAGINAL VAULT SUSPENSION;  Surgeon: Arlena LILLETTE Gal, MD;  Location: [facility];  Service: Urology;  Laterality: N/A;  ANTERIOR VAULT REPAIR WITH XENFORE    Current Medications: Current Meds  Medication Sig   estradiol  (ESTRACE ) 0.1 MG/GM vaginal cream Place 1 Applicatorful vaginally 3 (three) times a week.   Magnesium  Oxide -Mg Supplement 420 (252 Mg) MG TABS TAKE 1 TABLET BY MOUTH TWICE A DAY  psyllium (METAMUCIL) 58.6 % packet Take 1 packet by mouth daily. (Patient taking differently: Take 1 packet by mouth as needed (irregularity).)     Allergies:   Zocor  [simvastatin ], Advil [ibuprofen], Bran [wheat], Hydrocodone , Hydrocodone -acetaminophen , Metoprolol  tartrate, Hydrochlorothiazide , Lipitor [atorvastatin], and Pravachol [pravastatin sodium]   Social History   Socioeconomic History   Marital status: Widowed    Spouse name: Not on  file   Number of children: 3   Years of education: Not on file   Highest education level: Not on file  Occupational History   Occupation: Retired  Tobacco Use   Smoking status: Never   Smokeless tobacco: Never  Vaping Use   Vaping status: Never Used  Substance and Sexual Activity   Alcohol use: No    Alcohol/week: 0.0 standard drinks of alcohol    Comment: no   Drug use: No   Sexual activity: Not Currently    Birth control/protection: Surgical  Other Topics Concern   Not on file  Social History Narrative   Married '68. 2 son - '69, '70; 1 son - '71: 2 grandchildren. 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      Son lives with her   Social Drivers of Health   Tobacco Use: Low Risk (07/06/2024)   Patient History    Smoking Tobacco Use: Never    Smokeless Tobacco Use: Never    Passive Exposure: Not on file  Financial Resource Strain: Low Risk (07/04/2024)   Overall Financial Resource Strain (CARDIA)    Difficulty of Paying Living Expenses: Not hard at all  Food Insecurity: No Food Insecurity (07/04/2024)   Epic    Worried About Programme Researcher, Broadcasting/film/video in the Last Year: Never true    Ran Out of Food in the Last Year: Never true  Transportation Needs: No Transportation Needs (07/04/2024)   Epic    Lack of Transportation (Medical): No    Lack of Transportation (Non-Medical): No  Physical Activity: Inactive (07/04/2024)   Exercise Vital Sign    Days of Exercise per Week: 0 days    Minutes of Exercise per Session: 0 min  Stress: No Stress Concern Present (07/04/2024)   [employer] of Occupational Health - Occupational Stress Questionnaire    Feeling of Stress: Not at all  Social Connections: Moderately Integrated (07/04/2024)   Social Connection and Isolation Panel    Frequency of Communication with Friends and Family: More than three times a week    Frequency of Social Gatherings with Friends and Family: Twice a week    Attends Religious Services: More than 4 times per year     Active Member of [employer] or Organizations: Yes    Attends Banker Meetings: More than 4 times per year    Marital Status: Widowed  Depression (PHQ2-9): Low Risk (07/04/2024)   Depression (PHQ2-9)    PHQ-2 Score: 0  Alcohol Screen: Low Risk (07/04/2024)   Alcohol Screen    Last Alcohol Screening Score (AUDIT): 0  Housing: Unknown (07/04/2024)   Epic    Unable to Pay for Housing in the Last Year: No    Number of Times Moved in the Last Year: Not on file    Homeless in the Last Year: No  Utilities: Not At Risk (07/04/2024)   Epic    Threatened with loss of utilities: No  Health Literacy: Adequate Health Literacy (07/04/2024)   B1300 Health Literacy    Frequency of need for help with medical instructions: Never     Family History:  The patient's family history includes Aneurysm in her brother; Breast cancer in her sister and sister; Cancer in an other family member; Coronary artery disease in her mother and another family member; Diabetes in an other family member; Heart disease in her mother; Hypertension in her mother and another family member; Kidney cancer in her son; Other in her mother; Pancreatic cancer in her brother; Prostate cancer in her brother; Stroke in her father and another family member. There is no history of Colon cancer, Esophageal cancer, Stomach cancer, or Liver disease.  ROS:   Please see the history of present illness.     EKGs/Labs/Other Studies Reviewed:    The following studies were reviewed today:  EKG:   03/11/22: SR with 84 with T wave flattening  Cardiac Studies & Procedures   ______________________________________________________________________________________________        SHERRILEE  LONG TERM MONITOR (3-14 DAYS) 05/04/2024  Narrative Patch Wear Time:  13 days and 6 hours (2025-10-18T17:04:11-398 to 2025-10-31T23:37:42-0400)  HR 55 - 185, average 86 bpm. 3 nonsustained SVT (longest 16 beats). No atrial fibrillation detected. Rare  supraventricular ectopy. Rare ventricular ectopy. No sustained arrhythmias. Symptom trigger episodes correspond to sinus rhythm primarily, sometimes sinus with rare ectopy.   [facility] Cardiac Electrophysiology   CT SCANS  CT CORONARY MORPH W/CTA COR W/SCORE 03/31/2022  Addendum 03/31/2022  2:51 PM ADDENDUM REPORT: 03/31/2022 14:49  EXAM: OVER-READ INTERPRETATION  CT CHEST  The following report is an over-read performed by radiologist Dr. Oneil Fortis [facility] on 03/31/2022. This over-read does not include interpretation of cardiac or coronary anatomy or pathology. The coronary calcium  score and coronary CTA interpretations by the cardiologist are attached.  COMPARISON:  07/13/2005  FINDINGS: Aorta normal caliber. Atherosclerotic calcifications ascending thoracic aorta. No pericardial effusion. Central pulmonary arteries grossly patent. Esophagus unremarkable. No adenopathy. Visualized upper abdomen unremarkable. Dependent atelectasis BILATERAL lower lobes. Remaining lungs clear. No infiltrate or pleural effusion. Osseous structures unremarkable.  IMPRESSION: Dependent atelectasis BILATERAL lower lobes.  No additional significant extracardiac abnormalities.  Aortic Atherosclerosis (ICD10-I70.0).   Electronically Signed By: Oneil Kiss M.D. On: 03/31/2022 14:49  Narrative HISTORY: 79 yo female with chest pain, nonspecific  EXAM: Cardiac/Coronary CTA  TECHNIQUE: The patient was scanned on a [employer].  PROTOCOL: A 120 kV prospective scan was triggered in the descending thoracic aorta at 111 HU's. Axial non-contrast 3 mm slices were carried out through the heart. The data set was analyzed on a dedicated work station and scored using the Agatson method. Gantry rotation speed was 250 msecs and collimation was .6 mm. Beta blockade and 0.8 mg of sl NTG was given. The 3D data set was reconstructed in 5% intervals of the 35-75 % of  the R-R cycle. Diastolic phases were analyzed on a dedicated work station using MPR, MIP and VRT modes. The patient received 100mL OMNIPAQUE  IOHEXOL  350 MG/ML SOLN of contrast.  FINDINGS: Quality: Good, HR 49  Coronary calcium  score: The patient's coronary artery calcium  score is 87.9, which places the patient in the 56th percentile.  Coronary arteries: Common left coronary ostium.  Right dominance.  Right Coronary Artery: Dominant. Moderate-sized vessel without disease.  Left Main Coronary Artery: Common left coronary ostium.  Left Anterior Descending Coronary Artery: Moderate-sized anterior artery that reaches the apex. There is mild proximal 25-49% mixed stenosis.  Left Circumflex Artery: AV groove LCX - no disease.  Aorta: Normal size, 24 mm at the mid ascending aorta (level of the PA bifurcation) measured double oblique.  Aortic atherosclerosis. No dissection.  Aortic Valve: Trileaflet. No calcifications.  Other findings:  Normal pulmonary vein drainage into the left atrium.  Normal left atrial appendage without a thrombus.  Normal size of the pulmonary artery.  IMPRESSION: 1. 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  2. Coronary calcium  score of 87.9. This was 56th percentile for age and sex matched control.  3. Common left coronary ostium with right dominance.  4. Consider non-coronary causes of chest pain.  Electronically Signed: By: Vinie JAYSON Maxcy M.D. On: 03/31/2022 12:40     ______________________________________________________________________________________________        Recent Labs: 04/11/2024: TSH 1.98 07/06/2024: ALT 16; BUN 13; Creatinine, Ser 0.65; Hemoglobin 13.1; Magnesium  1.7; Platelets 273.0; Potassium 3.9; Sodium 140  Recent Lipid Panel    Component Value Date/Time   CHOL 196 07/06/2024 1038   CHOL 174 11/11/2023 1144   CHOL 272 (H) 05/22/2014 0956   TRIG 88.0 07/06/2024 1038   TRIG 111 05/22/2014 0956   HDL 60.10  07/06/2024 1038   HDL 68 11/11/2023 1144   HDL 67 05/22/2014 0956   CHOLHDL 3 07/06/2024 1038   VLDL 17.6 07/06/2024 1038   LDLCALC 118 (H) 07/06/2024 1038   LDLCALC 92 11/11/2023 1144   LDLCALC 183 (H) 05/22/2014 0956   LDLDIRECT 124 (H) 07/10/2022 1049   LDLDIRECT 175.3 04/24/2008 0848       Physical Exam:    VS:  BP 139/75 (BP Location: Left Arm)   Pulse 78   Ht 5' (1.524 m)   Wt 126 lb (57.2 kg)   SpO2 98%   BMI 24.61 kg/m     Wt Readings from Last 3 Encounters:  07/10/24 126 lb (57.2 kg)  07/06/24 127 lb 9.6 oz (57.9 kg)  07/04/24 130 lb (59 kg)    Gen: No Distress   Neck: No JVD Cardiac: No Rubs or Gallops, No murmur, RRR +2 radial pulses Respiratory: Clear to auscultation bilaterally, normal effort, normal  respiratory rate GI: Soft, nontender, non-distended  MS: No  edema;  moves all extremities Integument: Skin feels warm Neuro:  At time of evaluation, alert and oriented to person/place/time/situation  Psych: Normal affect, patient feels well   ASSESSMENT/PLAN:    Paroxysmal supraventricular tachycardia Well-managed with ongoing symptoms. Electrolyte management has improved symptoms. Avoidance of beta-blockers due to past intolerance. Consideration of calcium  channel blockers if symptoms persist despite electrolyte management. - Continue current electrolyte management. - Avoid beta-blockers unless necessary. - Will consider calcium  channel blocker if symptoms persist despite electrolyte management.  Nonobstructive coronary artery disease Well-managed with no current symptoms of angina. No need for aspirin  or Plavix due to tinnitus with aspirin  use. - Continue current management without aspirin  or Plavix.  Mixed hyperlipidemia with statin intolerance Mixed hyperlipidemia with intolerance to multiple statins and Zetia . Declined PCSK9 inhibitors and bempedoic acid. Currently on Zetia  10 mg. Open to revisiting treatment options if needed. - Continue Zetia  10  mg. - Will discuss treatment options if lipid levels become unmanageable.  Essential hypertension Blood pressure slightly elevated today. No current need for diuretic therapy. - Continue to monitor blood pressure.  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 No current symptoms of aortic stenosis. Low threshold for echocardiogram if symptoms develop. - Monitor for symptoms of aortic stenosis. - Will consider echocardiogram if symptoms develop.  One year  Longitudinal care: The evaluation and management services provided today reflect the complexity inherent in caring for this patient, including the ongoing longitudinal relationship and management of multiple  chronic conditions and/or the need for care coordination. The visit required a comprehensive assessment and management plan tailored to the patient's unique needs Time was spent addressing not only the acute concerns but also the broader context of the patient's health, including preventive care, chronic disease management, and care coordination as appropriate.  Complex longitudinal is necessary for conditions including: polypharmacy concerns with medication intolerance and need for electrolyte therapy  Stanly Leavens, MD FASE [facility] Cardiologist [facility]  [address] [place], [place] [address] [phone]  1:19 PM  "

## 2024-07-10 NOTE — Patient Instructions (Signed)
 Medication Instructions:  Your physician recommends that you continue on your current medications as directed. Please refer to the Current Medication list given to you today.   *If you need a refill on your cardiac medications before your next appointment, please call your pharmacy*  Lab Work: NONE    Testing/Procedures: NONE   Follow-Up: At [facility], you and your health needs are our priority.  As part of our continuing mission to provide you with exceptional heart care, our providers are all part of one team.  This team includes your primary Cardiologist (physician) and Advanced Practice Providers or APPs (Physician Assistants and Nurse Practitioners) who all work together to provide you with the care you need, when you need it.  Your next appointment:   1 year(s)  Provider:   Stanly DELENA Leavens, MD     Other Instructions

## 2024-07-13 ENCOUNTER — Other Ambulatory Visit: Payer: Self-pay | Admitting: Internal Medicine

## 2024-07-13 DIAGNOSIS — E876 Hypokalemia: Secondary | ICD-10-CM

## 2024-07-28 ENCOUNTER — Other Ambulatory Visit: Payer: Self-pay

## 2024-07-28 ENCOUNTER — Encounter (HOSPITAL_COMMUNITY): Payer: Self-pay

## 2024-07-28 ENCOUNTER — Emergency Department (HOSPITAL_COMMUNITY)

## 2024-07-28 ENCOUNTER — Emergency Department (HOSPITAL_COMMUNITY)
Admission: EM | Admit: 2024-07-28 | Discharge: 2024-07-29 | Disposition: A | Attending: Emergency Medicine | Admitting: Emergency Medicine

## 2024-07-28 DIAGNOSIS — M79602 Pain in left arm: Secondary | ICD-10-CM | POA: Diagnosis present

## 2024-07-28 DIAGNOSIS — W000XXA Fall on same level due to ice and snow, initial encounter: Secondary | ICD-10-CM | POA: Insufficient documentation

## 2024-07-28 DIAGNOSIS — Y92014 Private driveway to single-family (private) house as the place of occurrence of the external cause: Secondary | ICD-10-CM | POA: Insufficient documentation

## 2024-07-28 DIAGNOSIS — W19XXXA Unspecified fall, initial encounter: Secondary | ICD-10-CM

## 2024-07-28 NOTE — ED Triage Notes (Signed)
 Pt slipped and fell on ice while in her driveway this evening. She c/o pain to her L arm. She reports falling onto her buttock, but denies pain in her buttock or hip. She denies hitting her head or LOC. She is not on anticoagulants.

## 2024-07-29 ENCOUNTER — Emergency Department (HOSPITAL_COMMUNITY)

## 2024-07-29 MED ORDER — LIDOCAINE 5 % EX PTCH
2.0000 | MEDICATED_PATCH | CUTANEOUS | Status: DC
  Administered 2024-07-29: 2 via TRANSDERMAL
  Filled 2024-07-29: qty 2

## 2024-07-29 MED ORDER — LIDOCAINE 5 % EX PTCH: 1.0000 | MEDICATED_PATCH | CUTANEOUS | 0 refills | Status: AC

## 2024-07-29 NOTE — ED Provider Notes (Signed)
 " [place] [facility] AT [facility] Provider Note   CSN: [id number] Arrival date & time: 07/28/24  2229     Patient presents with: [place]   [name] is a 79 y.o. female.   The history is provided by the patient.  Fall This is a new problem. The current episode started yesterday. The problem occurs rarely. The problem has been resolved. Pertinent negatives include no chest pain, no abdominal pain, no headaches and no shortness of breath. Associated symptoms comments: Did not hit head, no LOC.  No neck pain . Nothing aggravates the symptoms. Nothing relieves the symptoms. She has tried nothing for the symptoms. The treatment provided no relief.  Patient with left arm pain post fall.  Did not hit head, no LOC     Prior to Admission medications  Medication Sig Start Date End Date Taking? Authorizing Provider  lidocaine  (LIDODERM ) 5 % Place 1 patch onto the skin daily. Remove & Discard patch within 12 hours or as directed by MD 07/29/24  Yes Alakai Macbride, MD  alum & mag hydroxide-simeth (MAALOX/MYLANTA) 200-200-20 MG/5ML suspension Take 15 mLs by mouth as needed for indigestion or heartburn.    [provider]  esomeprazole  (NEXIUM ) 40 MG capsule Take 1 capsule (40 mg total) by mouth daily. 01/26/24   Joshua Debby LITTIE, MD  estradiol  (ESTRACE ) 0.1 MG/GM vaginal cream Place 1 Applicatorful vaginally 3 (three) times a week. 11/09/22   Eldonna Suzen Octave, MD  ezetimibe  (ZETIA ) 10 MG tablet Take 1 tablet (10 mg total) by mouth daily. 05/03/24   Joshua Debby LITTIE, MD  famotidine  (PEPCID ) 20 MG tablet Take 1 tablet (20 mg total) by mouth 2 (two) times daily. 01/26/24   Joshua Debby LITTIE, MD  fluticasone  (FLONASE ) 50 MCG/ACT nasal spray Place 2 sprays into both nostrils daily. 02/22/23   Rollene Almarie LABOR, MD  loratadine  (CLARITIN ) 10 MG tablet Take 1 tablet (10 mg total) by mouth daily. 10/11/23   Henson, Vickie L, NP-C  Magnesium  Oxide -Mg Supplement 420 (252 Mg)  MG TABS TAKE 1 TABLET BY MOUTH TWICE A DAY 05/27/24   Plotnikov, Aleksei V, MD  Multiple Vitamin (MULTIVITAMIN) tablet Take 1 tablet by mouth 3 (three) times a week.    [provider]  potassium chloride  (KLOR-CON ) 10 MEQ tablet TAKE 1 TABLET BY MOUTH 2 TIMES DAILY. 07/13/24   Joshua Debby LITTIE, MD  Probiotic Product (PROBIOTIC-10 ULTIMATE) CAPS Take by mouth daily at 6 (six) AM.    [provider]  psyllium (METAMUCIL) 58.6 % packet Take 1 packet by mouth daily. Patient taking differently: Take 1 packet by mouth as needed (irregularity).    [provider]  tiZANidine  (ZANAFLEX ) 2 MG tablet Take 1-2 tablets (2-4 mg total) by mouth every 8 (eight) hours as needed. 11/14/23   Corey, Evan S, MD  VITAMIN D PO Take 1 tablet by mouth daily.    [provider]  Wheat Dextrin (BENEFIBER DRINK MIX) PACK Take by mouth as needed.    [provider]    Allergies: Zocor  [simvastatin ], Advil [ibuprofen], Bran [wheat], Hydrocodone , Hydrocodone -acetaminophen , Metoprolol  tartrate, Hydrochlorothiazide , Lipitor [atorvastatin], and Pravachol [pravastatin sodium]    Review of Systems  Respiratory:  Negative for shortness of breath.   Cardiovascular:  Negative for chest pain.  Gastrointestinal:  Negative for abdominal pain.  Musculoskeletal:  Positive for arthralgias. Negative for back pain, gait problem, joint swelling, neck pain and neck stiffness.  Neurological:  Negative for seizures, facial asymmetry,  weakness, numbness and headaches.  All other systems reviewed and are negative.   Updated Vital Signs BP (!) 143/70 (BP Location: Right Arm)   Pulse 75   Temp 98.4 F (36.9 C) (Oral)   Resp 16   Ht 5' (1.524 m)   Wt 59 kg   SpO2 98%   BMI 25.39 kg/m   Physical Exam Vitals and nursing note reviewed.  Constitutional:      General: She is not in acute distress.    Appearance: Normal appearance. She is well-developed.  HENT:     Head: Normocephalic and  atraumatic.     Nose: Nose normal.  Eyes:     Pupils: Pupils are equal, round, and reactive to light.  Cardiovascular:     Rate and Rhythm: Normal rate and regular rhythm.     Pulses: Normal pulses.     Heart sounds: Normal heart sounds.  Pulmonary:     Effort: Pulmonary effort is normal. No respiratory distress.     Breath sounds: Normal breath sounds.  Abdominal:     General: Bowel sounds are normal. There is no distension.     Palpations: Abdomen is soft.     Tenderness: There is no abdominal tenderness. There is no guarding or rebound.  Musculoskeletal:        General: Normal range of motion.     Right shoulder: Normal.     Left shoulder: Normal.     Right upper arm: Normal.     Left upper arm: Normal.     Left elbow: Normal. No swelling, deformity, effusion or lacerations. Normal range of motion. No tenderness.     Left forearm: Normal.     Left wrist: No tenderness, bony tenderness, snuff box tenderness or crepitus. Normal pulse.     Left hand: Normal. No tenderness or bony tenderness. Normal range of motion. Normal strength. Normal sensation. There is no disruption of two-point discrimination. Normal capillary refill. Normal pulse.     Cervical back: Normal, normal range of motion and neck supple. No tenderness.     Thoracic back: Normal. No tenderness.     Lumbar back: Normal. No deformity or lacerations.     Comments: Sensation intact to confrontation in all nerve distributions of BUE,  5/5 strength   Skin:    General: Skin is warm and dry.     Capillary Refill: Capillary refill takes less than 2 seconds.     Findings: No erythema or rash.  Neurological:     General: No focal deficit present.     Mental Status: She is alert.     Deep Tendon Reflexes: Reflexes normal.  Psychiatric:        Mood and Affect: Mood normal.     (all labs ordered are listed, but only abnormal results are displayed) Labs Reviewed - No data to display  EKG: None  Radiology: DG Forearm  Left Result Date: 07/29/2024 EXAM: _VIEWS_ VIEW(S) XRAY OF THE LEFT FOREARM 07/28/2024 11:03:45 PM COMPARISON: None available. CLINICAL HISTORY: Fall with injury. FINDINGS: BONES AND JOINTS: No acute fracture. No malalignment. SOFT TISSUES: Unremarkable. IMPRESSION: 1. No acute fracture or dislocation. Electronically signed by: Greig Pique MD 07/29/2024 12:17 AM EST RP Workstation: [id number]   DG Humerus Left Result Date: 07/29/2024 EXAM: VIEW(S) XRAY OF THE LEFT HUMERUS 07/28/2024 11:03:45 PM COMPARISON: None available. CLINICAL HISTORY: Fall with injury. FINDINGS: BONES AND JOINTS: No acute fracture. No malalignment. Degenerative changes at the acromioclavicular joint. Calcific tendinitis at the humeral head. SOFT  TISSUES: Unremarkable. IMPRESSION: 1. No acute fracture or dislocation. 2. Calcific tendinitis at the humeral head. Electronically signed by: Greig Pique MD 07/29/2024 12:17 AM EST RP Workstation: [id number]     Procedures   Medications Ordered in the ED  lidocaine  (LIDODERM ) 5 % 2 patch (2 patches Transdermal Patch Applied 07/29/24 0342)                                    Medical Decision Making Patient slipped and fell onto bottom, did not hit head   Amount and/or Complexity of Data Reviewed Independent Historian:     Details: Son seen above  External Data Reviewed: notes.    Details: Previous notes reviewed  Radiology: ordered and independent interpretation performed.    Details: No PTX on Xray   Risk Prescription drug management. Risk Details: Well appearing.  Ambulated to bathroom.  No fractures.  Did not hit head, no LOC.  Stable for discharge.  Strict returns     Final diagnoses:  Fall, initial encounter  No signs of systemic illness or infection. The patient is nontoxic-appearing on exam and vital signs are within normal limits.  I have reviewed the triage vital signs and the nursing notes. Pertinent labs & imaging results that were available during my care  of the patient were reviewed by me and considered in my medical decision making (see chart for details). After history, exam, and medical workup I feel the patient has been appropriately medically screened and is safe for discharge home. Pertinent diagnoses were discussed with the patient. Patient was given return precautions.     ED Discharge Orders          Ordered    lidocaine  (LIDODERM ) 5 %  Every 24 hours        07/29/24 0438               Brightyn Mozer, MD 07/29/24 0443  "

## 2025-07-13 ENCOUNTER — Ambulatory Visit
